# Patient Record
Sex: Female | Born: 1945 | Race: White | Hispanic: No | Smoking: Former smoker
Health system: Southern US, Community
[De-identification: ages and names within clinical notes are randomized; demographics above are authoritative.]

## PROBLEM LIST (undated history)

## (undated) DIAGNOSIS — R911 Solitary pulmonary nodule: Principal | ICD-10-CM

## (undated) DIAGNOSIS — R5383 Other fatigue: Principal | ICD-10-CM

## (undated) DIAGNOSIS — A419 Sepsis, unspecified organism: Secondary | ICD-10-CM

## (undated) DIAGNOSIS — R0902 Hypoxemia: Secondary | ICD-10-CM

## (undated) DIAGNOSIS — J189 Pneumonia, unspecified organism: Secondary | ICD-10-CM

## (undated) DIAGNOSIS — D61818 Other pancytopenia: Secondary | ICD-10-CM

## (undated) DIAGNOSIS — I639 Cerebral infarction, unspecified: Secondary | ICD-10-CM

## (undated) DIAGNOSIS — G4730 Sleep apnea, unspecified: Secondary | ICD-10-CM

## (undated) DIAGNOSIS — Z5189 Encounter for other specified aftercare: Secondary | ICD-10-CM

## (undated) DIAGNOSIS — K219 Gastro-esophageal reflux disease without esophagitis: Secondary | ICD-10-CM

## (undated) DIAGNOSIS — E119 Type 2 diabetes mellitus without complications: Principal | ICD-10-CM

## (undated) DIAGNOSIS — H269 Unspecified cataract: Secondary | ICD-10-CM

## (undated) DIAGNOSIS — F419 Anxiety disorder, unspecified: Secondary | ICD-10-CM

## (undated) DIAGNOSIS — I10 Essential (primary) hypertension: Secondary | ICD-10-CM

## (undated) DIAGNOSIS — F32A Depression, unspecified: Secondary | ICD-10-CM

## (undated) DIAGNOSIS — H353290 Exudative age-related macular degeneration, unspecified eye, stage unspecified: Secondary | ICD-10-CM

## (undated) DIAGNOSIS — M1990 Unspecified osteoarthritis, unspecified site: Secondary | ICD-10-CM

## (undated) DIAGNOSIS — A159 Respiratory tuberculosis unspecified: Secondary | ICD-10-CM

## (undated) DIAGNOSIS — E7800 Pure hypercholesterolemia, unspecified: Secondary | ICD-10-CM

## (undated) DIAGNOSIS — N189 Chronic kidney disease, unspecified: Secondary | ICD-10-CM

## (undated) DIAGNOSIS — R748 Abnormal levels of other serum enzymes: Secondary | ICD-10-CM

## (undated) DIAGNOSIS — K559 Vascular disorder of intestine, unspecified: Secondary | ICD-10-CM

## (undated) DIAGNOSIS — R05 Cough: Secondary | ICD-10-CM

## (undated) DIAGNOSIS — R0982 Postnasal drip: Secondary | ICD-10-CM

## (undated) DIAGNOSIS — M810 Age-related osteoporosis without current pathological fracture: Secondary | ICD-10-CM

## (undated) DIAGNOSIS — F329 Major depressive disorder, single episode, unspecified: Secondary | ICD-10-CM

## (undated) DIAGNOSIS — C9000 Multiple myeloma not having achieved remission: Secondary | ICD-10-CM

## (undated) DIAGNOSIS — E785 Hyperlipidemia, unspecified: Secondary | ICD-10-CM

## (undated) HISTORY — PX: APPENDECTOMY: SHX54

## (undated) HISTORY — DX: Hypoxemia: R09.02

## (undated) HISTORY — DX: Vascular disorder of intestine, unspecified: K55.9

## (undated) HISTORY — DX: Unspecified osteoarthritis, unspecified site: M19.90

## (undated) HISTORY — DX: Sleep apnea, unspecified: G47.30

## (undated) HISTORY — DX: Major depressive disorder, single episode, unspecified: F32.9

## (undated) HISTORY — DX: Cerebral infarction, unspecified: I63.9

## (undated) HISTORY — DX: Multiple myeloma not having achieved remission: C90.00

## (undated) HISTORY — DX: Gastro-esophageal reflux disease without esophagitis: K21.9

## (undated) HISTORY — DX: Chronic kidney disease, unspecified: N18.9

## (undated) HISTORY — DX: Cough: R05

## (undated) HISTORY — DX: Depression, unspecified: F32.A

## (undated) HISTORY — DX: Pure hypercholesterolemia, unspecified: E78.00

## (undated) HISTORY — DX: Postnasal drip: R09.82

## (undated) HISTORY — DX: Other pancytopenia: D61.818

## (undated) HISTORY — DX: Encounter for other specified aftercare: Z51.89

## (undated) HISTORY — DX: Solitary pulmonary nodule: R91.1

## (undated) HISTORY — DX: Type 2 diabetes mellitus without complications: E11.9

## (undated) HISTORY — DX: Essential (primary) hypertension: I10

## (undated) HISTORY — DX: Other fatigue: R53.83

## (undated) HISTORY — PX: TONSILLECTOMY: SHX5217

## (undated) HISTORY — DX: Exudative age-related macular degeneration, unspecified eye, stage unspecified: H35.3290

## (undated) HISTORY — DX: Age-related osteoporosis without current pathological fracture: M81.0

## (undated) HISTORY — PX: BREAST BIOPSY: SHX20

## (undated) HISTORY — DX: Unspecified cataract: H26.9

## (undated) HISTORY — DX: Anxiety disorder, unspecified: F41.9

## (undated) HISTORY — PX: LEG SURGERY: SHX1003

## (undated) HISTORY — PX: LUNG LOBECTOMY: SHX167

## (undated) HISTORY — DX: Hyperlipidemia, unspecified: E78.5

## (undated) HISTORY — PX: LAPAROSCOPIC, APPENDECTOMY: SHX4473

---

## 1995-03-20 ENCOUNTER — Inpatient Hospital Stay
Admission: EM | Admit: 1995-03-20 | Disposition: A | Discharge status: Home / Self Care | Payer: Self-pay | Source: Emergency Department | Admitting: Surgery

## 1996-01-28 HISTORY — PX: APPENDECTOMY: SHX54

## 1999-12-28 ENCOUNTER — Encounter (INDEPENDENT_AMBULATORY_CARE_PROVIDER_SITE_OTHER): Payer: Self-pay | Admitting: *Deleted

## 1999-12-28 LAB — CONVERTED CEMR LAB

## 2000-01-22 ENCOUNTER — Other Ambulatory Visit: Admission: RE | Admit: 2000-01-22 | Discharge: 2000-01-22 | Payer: Self-pay | Admitting: *Deleted

## 2000-02-26 ENCOUNTER — Encounter: Admission: RE | Admit: 2000-02-26 | Discharge: 2000-02-26 | Payer: Self-pay | Admitting: Family Medicine

## 2000-03-17 ENCOUNTER — Encounter: Admission: RE | Admit: 2000-03-17 | Discharge: 2000-03-17 | Payer: Self-pay | Admitting: Sports Medicine

## 2002-07-08 ENCOUNTER — Other Ambulatory Visit: Admission: RE | Admit: 2002-07-08 | Discharge: 2002-07-08 | Payer: Self-pay | Admitting: Family Medicine

## 2002-08-16 ENCOUNTER — Emergency Department (HOSPITAL_COMMUNITY): Admission: EM | Admit: 2002-08-16 | Discharge: 2002-08-16 | Payer: Self-pay | Admitting: Emergency Medicine

## 2002-08-16 ENCOUNTER — Encounter: Payer: Self-pay | Admitting: Emergency Medicine

## 2002-09-25 ENCOUNTER — Encounter (INDEPENDENT_AMBULATORY_CARE_PROVIDER_SITE_OTHER): Payer: Self-pay

## 2002-09-25 ENCOUNTER — Inpatient Hospital Stay (HOSPITAL_COMMUNITY): Admission: EM | Admit: 2002-09-25 | Discharge: 2002-09-28 | Payer: Self-pay | Admitting: Emergency Medicine

## 2003-01-06 ENCOUNTER — Ambulatory Visit (HOSPITAL_COMMUNITY): Admission: RE | Admit: 2003-01-06 | Discharge: 2003-01-06 | Payer: Self-pay | Admitting: Family Medicine

## 2003-01-16 ENCOUNTER — Ambulatory Visit (HOSPITAL_COMMUNITY): Admission: RE | Admit: 2003-01-16 | Discharge: 2003-01-16 | Payer: Self-pay | Admitting: *Deleted

## 2003-01-16 ENCOUNTER — Encounter (INDEPENDENT_AMBULATORY_CARE_PROVIDER_SITE_OTHER): Payer: Self-pay | Admitting: Specialist

## 2003-07-09 ENCOUNTER — Emergency Department (HOSPITAL_COMMUNITY): Admission: EM | Admit: 2003-07-09 | Discharge: 2003-07-09 | Payer: Self-pay | Admitting: Emergency Medicine

## 2003-07-19 ENCOUNTER — Ambulatory Visit (HOSPITAL_COMMUNITY): Admission: RE | Admit: 2003-07-19 | Discharge: 2003-07-19 | Payer: Self-pay | Admitting: *Deleted

## 2003-10-06 ENCOUNTER — Ambulatory Visit (HOSPITAL_COMMUNITY): Admission: RE | Admit: 2003-10-06 | Discharge: 2003-10-06 | Payer: Self-pay | Admitting: Orthopedic Surgery

## 2003-10-06 ENCOUNTER — Ambulatory Visit (HOSPITAL_BASED_OUTPATIENT_CLINIC_OR_DEPARTMENT_OTHER): Admission: RE | Admit: 2003-10-06 | Discharge: 2003-10-06 | Payer: Self-pay | Admitting: Orthopedic Surgery

## 2004-01-30 ENCOUNTER — Other Ambulatory Visit: Admission: RE | Admit: 2004-01-30 | Discharge: 2004-01-30 | Payer: Self-pay | Admitting: Family Medicine

## 2005-06-03 ENCOUNTER — Other Ambulatory Visit: Admission: RE | Admit: 2005-06-03 | Discharge: 2005-06-03 | Payer: Self-pay | Admitting: Family Medicine

## 2005-07-11 ENCOUNTER — Ambulatory Visit (HOSPITAL_COMMUNITY): Admission: RE | Admit: 2005-07-11 | Discharge: 2005-07-11 | Payer: Self-pay | Admitting: Family Medicine

## 2005-07-14 ENCOUNTER — Ambulatory Visit (HOSPITAL_COMMUNITY): Admission: RE | Admit: 2005-07-14 | Discharge: 2005-07-14 | Payer: Self-pay | Admitting: Family Medicine

## 2005-10-14 ENCOUNTER — Ambulatory Visit (HOSPITAL_COMMUNITY): Admission: RE | Admit: 2005-10-14 | Discharge: 2005-10-14 | Payer: Self-pay | Admitting: Family Medicine

## 2006-03-27 ENCOUNTER — Encounter (INDEPENDENT_AMBULATORY_CARE_PROVIDER_SITE_OTHER): Payer: Self-pay | Admitting: *Deleted

## 2006-09-04 ENCOUNTER — Ambulatory Visit (HOSPITAL_COMMUNITY): Admission: RE | Admit: 2006-09-04 | Discharge: 2006-09-04 | Payer: Self-pay | Admitting: Family Medicine

## 2007-01-08 ENCOUNTER — Encounter: Payer: Self-pay | Admitting: Pulmonary Disease

## 2007-01-08 ENCOUNTER — Ambulatory Visit (HOSPITAL_COMMUNITY): Admission: RE | Admit: 2007-01-08 | Discharge: 2007-01-08 | Payer: Self-pay | Admitting: Family Medicine

## 2007-01-28 DIAGNOSIS — K559 Vascular disorder of intestine, unspecified: Secondary | ICD-10-CM

## 2007-01-28 HISTORY — DX: Vascular disorder of intestine, unspecified: K55.9

## 2007-02-17 ENCOUNTER — Other Ambulatory Visit: Admission: RE | Admit: 2007-02-17 | Discharge: 2007-02-17 | Payer: Self-pay | Admitting: Family Medicine

## 2007-05-13 ENCOUNTER — Ambulatory Visit: Payer: Self-pay | Admitting: Pulmonary Disease

## 2007-05-13 DIAGNOSIS — J984 Other disorders of lung: Secondary | ICD-10-CM | POA: Insufficient documentation

## 2007-05-13 DIAGNOSIS — R930 Abnormal findings on diagnostic imaging of skull and head, not elsewhere classified: Secondary | ICD-10-CM | POA: Insufficient documentation

## 2007-05-17 DIAGNOSIS — K219 Gastro-esophageal reflux disease without esophagitis: Secondary | ICD-10-CM | POA: Insufficient documentation

## 2007-05-17 DIAGNOSIS — E785 Hyperlipidemia, unspecified: Secondary | ICD-10-CM | POA: Insufficient documentation

## 2007-05-17 DIAGNOSIS — M810 Age-related osteoporosis without current pathological fracture: Secondary | ICD-10-CM | POA: Insufficient documentation

## 2007-05-17 DIAGNOSIS — I10 Essential (primary) hypertension: Secondary | ICD-10-CM | POA: Insufficient documentation

## 2007-05-17 DIAGNOSIS — E1169 Type 2 diabetes mellitus with other specified complication: Secondary | ICD-10-CM | POA: Insufficient documentation

## 2007-05-24 ENCOUNTER — Ambulatory Visit: Payer: Self-pay | Admitting: Cardiovascular Disease

## 2007-05-25 ENCOUNTER — Telehealth (INDEPENDENT_AMBULATORY_CARE_PROVIDER_SITE_OTHER): Payer: Self-pay | Admitting: *Deleted

## 2007-06-16 ENCOUNTER — Ambulatory Visit: Payer: Self-pay | Admitting: Pulmonary Disease

## 2007-06-23 ENCOUNTER — Ambulatory Visit: Payer: Self-pay | Admitting: Pulmonary Disease

## 2008-01-14 ENCOUNTER — Ambulatory Visit (HOSPITAL_COMMUNITY): Admission: RE | Admit: 2008-01-14 | Discharge: 2008-01-14 | Payer: Self-pay | Admitting: Family Medicine

## 2009-04-18 ENCOUNTER — Other Ambulatory Visit: Admission: RE | Admit: 2009-04-18 | Discharge: 2009-04-18 | Payer: Self-pay | Admitting: Family Medicine

## 2010-01-11 ENCOUNTER — Ambulatory Visit (HOSPITAL_COMMUNITY): Admission: RE | Admit: 2010-01-11 | Payer: Self-pay | Source: Home / Self Care | Admitting: Family Medicine

## 2010-02-17 ENCOUNTER — Encounter: Payer: Self-pay | Admitting: Family Medicine

## 2010-05-24 ENCOUNTER — Other Ambulatory Visit (HOSPITAL_COMMUNITY): Payer: Self-pay | Admitting: Family Medicine

## 2010-05-24 DIAGNOSIS — Z1231 Encounter for screening mammogram for malignant neoplasm of breast: Secondary | ICD-10-CM

## 2010-05-31 ENCOUNTER — Ambulatory Visit (HOSPITAL_COMMUNITY)
Admission: RE | Admit: 2010-05-31 | Discharge: 2010-05-31 | Disposition: A | Discharge status: Home / Self Care | Payer: Federal, State, Local not specified - PPO | Source: Ambulatory Visit | Attending: Family Medicine | Admitting: Family Medicine

## 2010-05-31 DIAGNOSIS — Z1231 Encounter for screening mammogram for malignant neoplasm of breast: Secondary | ICD-10-CM | POA: Insufficient documentation

## 2010-06-04 ENCOUNTER — Other Ambulatory Visit: Payer: Self-pay | Admitting: Family Medicine

## 2010-06-04 DIAGNOSIS — R928 Other abnormal and inconclusive findings on diagnostic imaging of breast: Secondary | ICD-10-CM

## 2010-06-11 ENCOUNTER — Ambulatory Visit
Admission: RE | Admit: 2010-06-11 | Discharge: 2010-06-11 | Disposition: A | Discharge status: Home / Self Care | Payer: Federal, State, Local not specified - PPO | Source: Ambulatory Visit | Attending: Family Medicine | Admitting: Family Medicine

## 2010-06-11 DIAGNOSIS — R928 Other abnormal and inconclusive findings on diagnostic imaging of breast: Secondary | ICD-10-CM

## 2010-06-14 NOTE — Op Note (Signed)
NAME:  [name], [name]                           ACCOUNT NO.:  [id number]   MEDICAL RECORD NO.:  [id number]                   PATIENT TYPE:  AMB   LOCATION:  DSC                                  FACILITY:  [facility]   PHYSICIAN:  Cindee Salt, M.D.                    DATE OF BIRTH:  11-25-1945   DATE OF PROCEDURE:  10/06/2003  DATE OF DISCHARGE:                                 OPERATIVE REPORT   PREOPERATIVE DIAGNOSIS:  Status post fall, right wrist.   POSTOPERATIVE DIAGNOSIS:  Status post fall, right wrist.   OPERATION:  Arthroscopy right wrist, with debridement of TFCC, LT, shrinkage  scapholunate, debridement of large avulsion of articular cartilage ulnar  lunate -- approximately one-half of the proximal articular surface, with  partial synovectomy right wrist.   SURGEON:  Cindee Salt, M.D.   Threasa HeadsCarolyne Fiscal   ANESTHESIA:  Axillary block.   HISTORY:  The patient is a 65 year old female who suffered a fall on her  right wrist.  She has had significant pain, inability to use the wrist, with  loss of mobility since the fall.   DESCRIPTION OF PROCEDURE:  The patient is brought to the operating room,  where an axillary block was carried out without difficulty.  She was prepped  using Duraprep in the supine position with right arm free.  The limb was  placed in the arthroscopy tower; 10 pounds of traction applied, the joint  inflated to 3-4 portal.  The transverse incision was made, deepened with a  hemostat.  A blunt trocar was used to enter the joint. Care was taken to  protect the EPL tendon.  The joint was inspected and scapholunate ligament  showed a partial tear, with stretching of the ligament.  The volar wrist  ligaments were intact.  The proximal cartilage on the scaphoid was intact.  The cartilage on the distal radius was intact.  The ulnar side of the joint  was inspected; very significant synovitis was identified.  A large avulsion  of cartilage from the lunate and a TFCC  traumatic tear and tear of lunate  carpal joint was immediately apparent.  An irrigation catheter was placed in  fixed U.  A 4-5 portal opened after localization with a 22-gauge needle.  This was deepened with a hemostat, a blunt trocar used to enter the joint,  the joint inspected.  A debridement was then performed using shavers, a  radio frequency device Arthro Wand and forceps.  This removed approximately  one-half to two-thirds of the articular surface.  Cartilage was entirely  denuded from the lunate, including portions on the lunate facet of the  distal radius portion of the lunate.  The TFCC was debrided.  The mid carpal  joint was inspected through the ulnar portal to inspect the proximal  capitate; in that the feeling was that ulnar shortening osteotomy would not  give her significant relief, due to the fact that there was articular  cartilage lost on that portion of the lunate articulating with the distal  radius.  The distal radius articular surface was entirely intact.  Continued  pain would necessitate a proximal row carpectomy.  As such, it was decided  not to proceed with ulnar shortening osteotomy in any way at this point and  time, with the plan on discussing the future with the patient with a  likelihood of recommending a proximal row carpectomy for continued  treatment.   A sterile compressive dressing was applied, after closure of the portals and  removal of the instruments.  The patient was taken to the recovery room for  observation in satisfactory condition.   She is discharged home, to return to the [facility] in one  week; on Vicodin.                                               Cindee Salt, M.D.    [name]  D:  10/06/2003  T:  10/07/2003  Job:  [id number]

## 2010-06-14 NOTE — Op Note (Signed)
   [name], [name]                             ACCOUNT NO.:  [id number]   MEDICAL RECORD NO.:  [id number]                   PATIENT TYPE:  OBV   LOCATION:  0464                                 FACILITY:  [facility]   PHYSICIAN:  John C. Madilyn Fireman, M.D.                 DATE OF BIRTH:  [birth year]   DATE OF PROCEDURE:  09/25/2002  DATE OF DISCHARGE:                                 OPERATIVE REPORT   PROCEDURE:  Colonoscopy with biopsy.   INDICATIONS FOR PROCEDURE:  Rectal bleeding and abdominal pain.   DESCRIPTION OF PROCEDURE:  The patient was placed in the left lateral  decubitus position and placed on the pulse monitor with continuous low-flow  oxygen delivered by nasal cannula.  She was sedated with 100 mcg IV  fentanyl, 10 mg IV Versed.  The Olympus video colonoscope was inserted into  the rectum and advanced approximately to the hepatic flexure, bypassing the  area of extremely severe-appearing colitis.  Based on this, I did not feel  it necessary and possibly risky to proceed to the cecum.  The scope was  thought to be advanced near the hepatic flexure and this area appeared  normal as did the transverse colon.  Within the descending colon at about 60  cm, there was a transition from mild erythema and granularity to gross  erythema, submucosal hemorrhages, edema, and at least two smooth walled-  appearing submucosal bullae which I had not seen before.  Otherwise the  appearance was most consistent with ischemic colitis.  The involvement was  circumferential and fairly severe in appearance down to about 30 cm in  transition to normal mucosa over about 10 cm.  From 20 cm down to the anus,  there was no abnormalities seen.  There were polyps, foreign bodies,  diverticula, or any other abnormalities seen.  Multiple biopsies were taken  of the inflamed area.  The scope was then withdrawn and the patient returned  to the recovery room in stable condition.  She tolerated the procedure well  and there were no immediate complications.   IMPRESSION:  Severe segmental colitis, most consistent with ischemia.   PLAN:  Will begin antibiotics and continue bowel rest while awaiting  pathology.                                                John C. Madilyn Fireman, M.D.    JCH/MEDQ  D:  09/25/2002  T:  09/25/2002  Job:  [id number]

## 2010-06-14 NOTE — H&P (Signed)
[name], [name]                             ACCOUNT NO.:  [id number]   MEDICAL RECORD NO.:  [id number]                   PATIENT TYPE:  OBV   LOCATION:  3149                                 [facility]ACILITY:  [facility]   PHYSICIAN:  John C. Amedeo Plenty, M.D.                 DATE OF BIRTH:  Mar 25, 1945   DATE OF ADMISSION:  09/24/2002  DATE OF DISCHARGE:                                HISTORY & PHYSICAL   HISTORY OF PRESENT ILLNESS:  The patient is a 65 year old white female who  presents with one week of constipation which she attempted to relieve with  Correctol in a suppository, but this caused increased abdominal cramping  which led to straining to have a bowel movement this morning, with no  results except for bright red blood on three or four occasions and with  continued abdominal cramping, and no stool ever seen by the patient.  She  presented to Dr. Georga Bora this morning who performed an anoscopy which  showed blood above the anal verge and no perianal source of bleeding noted.  Her hemoglobin was 14.8 and her vital signs were stable.  She was to be seen  in two days to set up a screening colonoscopy by Ronald Lobo, M.D.  She  has never had any prior rectal bleeding or any GI problems.   PAST MEDICAL HISTORY:  1. Depression.  2. Gastroesophageal reflux.   PAST SURGICAL HISTORY:  1. Appendectomy.  2. Leg surgery with rod in her left leg.  3. Tonsillectomy.   MEDICATIONS:  Actonel, omeprazole, and Wellbutrin.   ALLERGIES:  None.   FAMILY HISTORY:  Mother alive and well.  Father died of an MI.  Sister died  of a synovial sarcoma.   SOCIAL HISTORY:  The patient is divorced.  She smokes about 1/2 pack of  cigarettes a day and about two drinks per day.   PHYSICAL EXAMINATION:  GENERAL:  A well-developed, well-nourished white  female in no acute distress.  HEENT:  Unremarkable.  HEART:  Regular rate and rhythm without murmur.  LUNGS:  Clear.  ABDOMEN:  Soft, nondistended with  normal active bowel sounds.  No  hepatosplenomegaly, mass, or guarding.  There is minimal lower abdominal  tenderness.  RECTAL:  Not repeated.   IMPRESSION:  Rectal bleeding distal source likely, though no evidence of  perianal source.    PLAN:  I have decided to go ahead and admit her on observation status and  prep for a colonoscopy which we will proceed with tomorrow.                                               John C. Amedeo Plenty, M.D.    JCH/MEDQ  D:  09/24/2002  T:  09/24/2002  Job:  [id number]   cc:   Nelda Marseille, M.D.

## 2010-06-14 NOTE — Discharge Summary (Signed)
   [name], [name]                             ACCOUNT NO.:  [id number]   MEDICAL RECORD NO.:  [id number]                   PATIENT TYPE:  INP   LOCATION:  0464                                 FACILITY:  [facility]   PHYSICIAN:  Althea Grimmer. Luther Parody, M.D.            DATE OF BIRTH:  12-29-1945   DATE OF ADMISSION:  09/24/2002  DATE OF DISCHARGE:  09/28/2002                                 DISCHARGE SUMMARY   DISCHARGE DIAGNOSES:  1. Ischemic colitis.  2. Depression.  3. Gastroesophageal reflux.   HISTORY OF PRESENT ILLNESS:  The patient is a 65 year old female admitted by  John C. Madilyn Fireman, M.D.  She reported one week of constipation but following use  of Correctol suppositories she had a lot of cramping and bright red blood  per rectum with continual abdominal pain.   PAST SURGICAL HISTORY:  1. Appendectomy.  2. Orthopedic surgery on left leg.  3. Tonsillectomy.   For the remainder of her history, please see the admission note.   PHYSICAL EXAMINATION:  GENERAL:  She was in no acute distress.  ABDOMEN:  Soft, nondistended with normal bowel sounds.  There was no  hepatosplenomegaly, mass, or guarding.   HOSPITAL COURSE:  The patient was prepped for colonoscopy the following day  and she had findings of severe segmental colitis from 20-30 cm from the  anus.  Biopsies of this were consistent.  The patient was treated with  intravenous Unasyn and bowel rest and she rapidly improved.  On August 31  her diet was advanced and she was switched to oral antibiotics, Cipro and  Flagyl.  On September 1 she was doing fairly well with minimal abdominal  pain, tolerating a low fiber diet.   DISCHARGE MEDICATIONS:  1. Wellbutrin 300 mg daily.  2. Omeprazole 10 mg daily.  3. Flagyl 500 mg t.i.d. for seven days.  4. Ciprofloxacin 500 mg b.i.d. for seven days.    CONDITION ON DISCHARGE:  Improved.   FOLLOWUP:  Return to the office in two to three weeks or call p.r.n.                          Althea Grimmer. Luther Parody, M.D.    PJS/MEDQ  D:  09/28/2002  T:  09/28/2002  Job:  [id number]   cc:   Molly Maduro L. Foy Guadal[place]ara, M.D.  [address] [employer] [place]  [place] [address]  Fax: [phone]

## 2011-01-28 HISTORY — PX: COLONOSCOPY: SHX174

## 2011-07-28 ENCOUNTER — Other Ambulatory Visit (HOSPITAL_COMMUNITY): Payer: Self-pay | Admitting: Family Medicine

## 2011-07-28 DIAGNOSIS — Z1231 Encounter for screening mammogram for malignant neoplasm of breast: Secondary | ICD-10-CM

## 2011-08-20 ENCOUNTER — Ambulatory Visit (HOSPITAL_COMMUNITY): Payer: Federal, State, Local not specified - PPO

## 2011-09-04 ENCOUNTER — Ambulatory Visit (HOSPITAL_COMMUNITY): Payer: Federal, State, Local not specified - PPO

## 2011-10-01 ENCOUNTER — Ambulatory Visit (HOSPITAL_COMMUNITY): Payer: Federal, State, Local not specified - PPO

## 2011-10-27 ENCOUNTER — Other Ambulatory Visit: Payer: Self-pay | Admitting: Gastroenterology

## 2011-10-27 DIAGNOSIS — K5730 Diverticulosis of large intestine without perforation or abscess without bleeding: Secondary | ICD-10-CM | POA: Diagnosis not present

## 2011-10-27 DIAGNOSIS — D126 Benign neoplasm of colon, unspecified: Secondary | ICD-10-CM | POA: Diagnosis not present

## 2011-10-27 DIAGNOSIS — D649 Anemia, unspecified: Secondary | ICD-10-CM | POA: Diagnosis not present

## 2011-10-27 DIAGNOSIS — D1330 Benign neoplasm of unspecified part of small intestine: Secondary | ICD-10-CM | POA: Diagnosis not present

## 2011-10-27 DIAGNOSIS — K219 Gastro-esophageal reflux disease without esophagitis: Secondary | ICD-10-CM | POA: Diagnosis not present

## 2011-10-28 DIAGNOSIS — D126 Benign neoplasm of colon, unspecified: Secondary | ICD-10-CM | POA: Diagnosis not present

## 2011-10-28 DIAGNOSIS — D649 Anemia, unspecified: Secondary | ICD-10-CM | POA: Diagnosis not present

## 2011-12-17 DIAGNOSIS — D649 Anemia, unspecified: Secondary | ICD-10-CM | POA: Diagnosis not present

## 2011-12-17 DIAGNOSIS — K219 Gastro-esophageal reflux disease without esophagitis: Secondary | ICD-10-CM | POA: Diagnosis not present

## 2012-01-06 DIAGNOSIS — E1129 Type 2 diabetes mellitus with other diabetic kidney complication: Secondary | ICD-10-CM | POA: Diagnosis not present

## 2012-01-06 DIAGNOSIS — F329 Major depressive disorder, single episode, unspecified: Secondary | ICD-10-CM | POA: Diagnosis not present

## 2012-01-06 DIAGNOSIS — F3289 Other specified depressive episodes: Secondary | ICD-10-CM | POA: Diagnosis not present

## 2012-01-06 DIAGNOSIS — N181 Chronic kidney disease, stage 1: Secondary | ICD-10-CM | POA: Diagnosis not present

## 2012-01-06 DIAGNOSIS — E1149 Type 2 diabetes mellitus with other diabetic neurological complication: Secondary | ICD-10-CM | POA: Diagnosis not present

## 2012-01-06 DIAGNOSIS — D649 Anemia, unspecified: Secondary | ICD-10-CM | POA: Diagnosis not present

## 2012-01-06 DIAGNOSIS — Z23 Encounter for immunization: Secondary | ICD-10-CM | POA: Diagnosis not present

## 2012-01-06 DIAGNOSIS — E669 Obesity, unspecified: Secondary | ICD-10-CM | POA: Diagnosis not present

## 2012-01-06 DIAGNOSIS — R7401 Elevation of levels of liver transaminase levels: Secondary | ICD-10-CM | POA: Diagnosis not present

## 2012-01-06 DIAGNOSIS — E7800 Pure hypercholesterolemia, unspecified: Secondary | ICD-10-CM | POA: Diagnosis not present

## 2012-05-06 ENCOUNTER — Telehealth: Payer: Self-pay | Admitting: Oncology

## 2012-05-06 NOTE — Telephone Encounter (Addendum)
LVOM FOR PT TO RETURN CALL IN RE NP APPT.  °

## 2012-05-12 ENCOUNTER — Telehealth: Payer: Self-pay | Admitting: Oncology

## 2012-05-12 NOTE — Telephone Encounter (Signed)
C/D 05/12/12 for appt. 05/31/12 °

## 2012-05-27 DIAGNOSIS — C9000 Multiple myeloma not having achieved remission: Secondary | ICD-10-CM

## 2012-05-27 HISTORY — DX: Multiple myeloma not having achieved remission: C90.00

## 2012-05-28 ENCOUNTER — Encounter: Payer: Self-pay | Admitting: Oncology

## 2012-05-31 ENCOUNTER — Other Ambulatory Visit (HOSPITAL_BASED_OUTPATIENT_CLINIC_OR_DEPARTMENT_OTHER): Payer: Medicare Other | Admitting: Lab

## 2012-05-31 ENCOUNTER — Ambulatory Visit (HOSPITAL_BASED_OUTPATIENT_CLINIC_OR_DEPARTMENT_OTHER): Payer: Medicare Other | Admitting: Oncology

## 2012-05-31 ENCOUNTER — Encounter: Payer: Self-pay | Admitting: Oncology

## 2012-05-31 ENCOUNTER — Other Ambulatory Visit: Payer: Self-pay | Admitting: Oncology

## 2012-05-31 ENCOUNTER — Telehealth: Payer: Self-pay | Admitting: Oncology

## 2012-05-31 ENCOUNTER — Ambulatory Visit: Payer: Medicare Other

## 2012-05-31 VITALS — BP 142/77 | HR 78 | Temp 97.4°F | Resp 20 | Ht 65.0 in | Wt 208.4 lb

## 2012-05-31 DIAGNOSIS — D61818 Other pancytopenia: Secondary | ICD-10-CM

## 2012-05-31 DIAGNOSIS — R945 Abnormal results of liver function studies: Secondary | ICD-10-CM

## 2012-05-31 DIAGNOSIS — R7989 Other specified abnormal findings of blood chemistry: Secondary | ICD-10-CM

## 2012-05-31 DIAGNOSIS — D649 Anemia, unspecified: Secondary | ICD-10-CM

## 2012-05-31 DIAGNOSIS — R5383 Other fatigue: Secondary | ICD-10-CM

## 2012-05-31 LAB — CBC WITH DIFFERENTIAL/PLATELET
BASO%: 0.2 % (ref 0.0–2.0)
Basophils Absolute: 0 10*3/uL (ref 0.0–0.1)
EOS%: 3 % (ref 0.0–7.0)
HGB: 11.4 g/dL — ABNORMAL LOW (ref 11.6–15.9)
MCH: 32.2 pg (ref 25.1–34.0)
MCV: 97 fL (ref 79.5–101.0)
MONO%: 6.8 % (ref 0.0–14.0)
RBC: 3.54 10*6/uL — ABNORMAL LOW (ref 3.70–5.45)
RDW: 14.4 % (ref 11.2–14.5)
lymph#: 1.3 10*3/uL (ref 0.9–3.3)

## 2012-05-31 LAB — MORPHOLOGY: PLT EST: ADEQUATE

## 2012-05-31 LAB — FERRITIN: Ferritin: 72 ng/mL (ref 10–291)

## 2012-05-31 NOTE — Progress Notes (Signed)
[place]  [phone] Fax:([phone]) [place]    Referral MD:  [name], M.D.  Reason for Referral: anemia and leukopenia.     HPI: [name]. Winward is a 67 year-old woman.  She was told that she had anemia as a kid and was told to have some form of blood dyscrasia.  She has not required pRBC nor had any known problem of her CBC.  For the past two months, she has been feeling tired.  Her PCP checked her CBC on 05/05/2011 which showed WBC 2.4; Hgb 11; Plt 148; ANC 1.3.  She was kindly referred to the [facility] for evaluation.  Anemia work up with colonoscopy in 2013 with one adenomatous polyp; she is due for a repeat in 2918.  EGD was negative.  HIV was negative.  TSH, iron panel (without ferritin), Vit B12 were normal.   [name] presented to the clinic by herself today.  She reported that she has been having progressive fatigue for the last two months.  She sleeps about 12 hours a night, but can hardly get herself out of bed in the morning. Patient denies fever, anorexia, weight loss, headache, visual changes, confusion, drenching night sweats, palpable lymph node swelling, mucositis, odynophagia, dysphagia, nausea vomiting, jaundice, chest pain, palpitation, shortness of breath, dyspnea on exertion, productive cough, gum bleeding, epistaxis, hematemesis, hemoptysis, abdominal pain, abdominal swelling, early satiety, melena, hematochezia, hematuria, skin rash, spontaneous bleeding, joint swelling, joint pain, heat or cold intolerance, bowel bladder incontinence, back pain, focal motor weakness, paresthesia.     Past Medical History  Diagnosis Date  . Depression   . Osteoporosis   . Hypercholesterolemia   . Esophageal reflux disease   . Hypertension   . Diabetes type 2, controlled   . Sleep apnea   . Pancytopenia   . Ischemic colitis 2009  :    Past Surgical History  Procedure Laterality Date  .  Colonoscopy  2013    poly; Dr. Oletta Lamas, next one due 2018.l  . Appendectomy    . Tonsillectomy    :   CURRENT MEDS: Current Outpatient Prescriptions  Medication Sig Dispense Refill  . buPROPion (WELLBUTRIN XL) 300 MG 24 hr tablet Take 300 mg by mouth daily.      Marland Kitchen esomeprazole (NEXIUM) 40 MG capsule Take 40 mg by mouth daily before breakfast.      . losartan (COZAAR) 50 MG tablet Take 50 mg by mouth daily.      . metformin (FORTAMET) 500 MG (OSM) 24 hr tablet Take 1,500 mg by mouth. daily      . metoprolol succinate (TOPROL-XL) 50 MG 24 hr tablet Take 50 mg by mouth daily. Take with or immediately following a meal.      . Multiple Vitamins-Minerals (MULTIVITAMIN WITH MINERALS) tablet Take 1 tablet by mouth daily.      . sertraline (ZOLOFT) 100 MG tablet Take 100 mg by mouth daily.      . simvastatin (ZOCOR) 40 MG tablet Take 40 mg by mouth every evening.       No current facility-administered medications for this visit.      No Known Allergies:  Family History  Problem Relation Age of Onset  . Heart attack Father   . Cancer Sister     synovial sarcoma  . Cancer Maternal Grandfather     ? cancer  :  History   Social History  . Marital  Status: Divorced    Spouse Name: N/A    Number of Children: 2  . Years of Education: N/A   Occupational History  .      retired.    Social History Main Topics  . Smoking status: Former Smoker -- 1.00 packs/day for 25 years    Quit date: 01/27/2009  . Smokeless tobacco: Never Used  . Alcohol Use: 1.8 oz/week    3 Glasses of wine per week  . Drug Use: No  . Sexually Active: Not on file   Other Topics Concern  . Not on file   Social History Narrative  . No narrative on file  :  REVIEW OF SYSTEM:  The rest of the 14-point review of sytem was negative.   Exam: ECOG 1  General:  well-nourished woman, in no acute distress.  Eyes:  no scleral icterus.  ENT:  There were no oropharyngeal lesions.  Neck was without thyromegaly.   Lymphatics:  Negative cervical, supraclavicular or axillary adenopathy.  Respiratory: lungs were clear bilaterally without wheezing or crackles.  Cardiovascular:  Regular rate and rhythm, S1/S2, without murmur, rub or gallop.  There was no pedal edema.  GI:  abdomen was soft, flat, nontender, nondistended, without organomegaly.  Muscoloskeletal:  no spinal tenderness of palpation of vertebral spine.  Skin exam was without echymosis, petichae.  Neuro exam was nonfocal.  Patient was able to get on and off exam table without assistance.  Gait was normal.  Patient was alert and oriented.  Attention was good.   Language was appropriate.  Mood was normal without depression.  Speech was not pressured.  Thought content was not tangential.    LABS:  Lab Results  Component Value Date   WBC 3.9 05/31/2012   HGB 11.4* 05/31/2012   HCT 34.3* 05/31/2012   PLT 173 05/31/2012    Blood smear review:   I personally reviewed the patient's peripheral blood smear today.  There was isocytosis.  There was no peripheral blast.  There was no schistocytosis, spherocytosis, target cell, rouleaux formation, tear drop cell.  There was no giant platelets or platelet clumps.      ASSESSMENT AND PLAN:   1.  Issue:  Slight normocytic anemia; neutropenia (resolved) -  Unclear causes:  Work up was negative for HIV, iron deficiency, Vit B12 deficiency, low thyroid.  I sent blood work today to rule out myeloma (a slow growing bone marrow cancer which can cause anemia).  However, there is a possibility of EtOH-induced cytopenia.  - There is low clinical suspicion for bone marrow failure such as myelodysplastic syndrome despite her diagnosis of some type of blood dyscrasia when she was 67 years-old.  A bone marrow biopsy at this time has low yield. - Recommendation:  *  Stop EtOH.   *  Follow up:  Repeat blood count at the [facility] in about 3 months.  Return to clinic in about 6 months.  In the future, if blood counts significantly  decrease, then further work up may be considered.   2.  Elevated LFT's:  I strongly recommended that she taper down her EtOH in take to no more than 1 glass of wine a day.  She currently drinks about a bottle at each sitting a few times a week.  I referred her to abdominal US to rule out cirrhosis.   3.  Chronic fatigue:  Unclear etiology.  Hgb of 11.4 is not normally the cause of fatigue.  Given her divorce, sleeping 12  hour/day, EtOH intake, and fatigue, I query if there is a component of depression.   The length of time of the face-to-face encounter was 45 minutes. More than 50% of time was spent counseling and coordination of care.     Thank you for this referral.

## 2012-05-31 NOTE — Patient Instructions (Addendum)
1.  Diagnosis:  Slight anemia. 2.  Unclear causes:  Work up was negative for HIV, iron deficiency, Vit B12 deficiency, low thyroid.  I sent blood work today to rule out myeloma (a slow growing bone marrow cancer which can cause anemia).  3.  There is low clinical suspicion for bone marrow failure such as myelodysplastic syndrome.  A bone marrow biopsy at this time has low yield. 4.  Recommendation:  *  Make sure colonoscopy is up to date.  *  Follow up:  [facility] in about [facility] months.  Return to clinic in about 6 months.  In the future, if blood counts significantly decrease, then further work up may be considered.

## 2012-05-31 NOTE — Progress Notes (Signed)
Checked in new pt with no financial concerns. °

## 2012-05-31 NOTE — Telephone Encounter (Signed)
CALLED DR. Gerri Spore OFFICE REQUESTING LABS TO BE FAXED OVER.

## 2012-06-02 ENCOUNTER — Other Ambulatory Visit: Payer: Self-pay | Admitting: Oncology

## 2012-06-02 LAB — PROTEIN ELECTROPHORESIS, SERUM
Beta 2: 3.5 % (ref 3.2–6.5)
Beta Globulin: 6.4 % (ref 4.7–7.2)
M-Spike, %: 1.69 g/dL

## 2012-06-03 ENCOUNTER — Telehealth: Payer: Self-pay | Admitting: Oncology

## 2012-06-03 NOTE — Telephone Encounter (Signed)
, °

## 2012-06-04 ENCOUNTER — Telehealth: Payer: Self-pay | Admitting: Oncology

## 2012-06-04 ENCOUNTER — Encounter: Payer: Self-pay | Admitting: Oncology

## 2012-06-04 ENCOUNTER — Ambulatory Visit (HOSPITAL_BASED_OUTPATIENT_CLINIC_OR_DEPARTMENT_OTHER): Payer: Medicare Other | Admitting: Oncology

## 2012-06-04 VITALS — BP 150/67 | HR 83 | Temp 97.9°F | Resp 20 | Ht 65.0 in | Wt 209.3 lb

## 2012-06-04 DIAGNOSIS — R5381 Other malaise: Secondary | ICD-10-CM

## 2012-06-04 DIAGNOSIS — R5383 Other fatigue: Secondary | ICD-10-CM

## 2012-06-04 DIAGNOSIS — D649 Anemia, unspecified: Secondary | ICD-10-CM

## 2012-06-04 DIAGNOSIS — D472 Monoclonal gammopathy: Secondary | ICD-10-CM

## 2012-06-04 NOTE — Telephone Encounter (Signed)
gv and printed appt sched for pt for May...sent pt to lab to pick up jug.Marland KitchenMarland KitchenMarland Kitchen

## 2012-06-04 NOTE — Patient Instructions (Addendum)
1.  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 2.  Need to rule out multiple myeloma. 3.  Work up:   *  24-hour urine collection.  *  Bone marrow biopsy  *  Skeletal Xray.  4.  If myeloma; this will account for anemia and fatigue.  It is a very treatable type of bone marrow cancer.

## 2012-06-04 NOTE — Progress Notes (Signed)
[facility]  Telephone:([phone] Fax:([phone]) [phone]   OFFICE PROGRESS NOTE   Cc:  [name], MD  DIAGNOSIS: mild anemia with positive monoclonal protein; work up pending.   CURRENT THERAPY:  Pending work up.   INTERVAL HISTORY: [name] 67 y.o. female returns to discuss the result of her positive monoclonal protein earlier this week.  She still has mild fatigue.  She is still independent of all activities of daily living.  She still takes EtOH but claims that it's much less than what it used to be.  She denied fever, chest pain, SOB, abd pain/swelling, jaundice.  The rest of the 14-point review of system was negative.   Past Medical History  Diagnosis Date  . Depression   . Osteoporosis   . Hypercholesterolemia   . Esophageal reflux disease   . Hypertension   . Diabetes type 2, controlled   . Sleep apnea   . Pancytopenia   . Ischemic colitis 2009  . Monoclonal gammopathy     Past Surgical History  Procedure Laterality Date  . Colonoscopy  2013    poly; Dr. Randa Evens, next one due 2018.l  . Appendectomy    . Tonsillectomy      Current Outpatient Prescriptions  Medication Sig Dispense Refill  . buPROPion (WELLBUTRIN XL) 300 MG 24 hr tablet Take 300 mg by mouth daily.      Marland Kitchen esomeprazole (NEXIUM) 40 MG capsule Take 40 mg by mouth daily before breakfast.      . losartan (COZAAR) 50 MG tablet Take 50 mg by mouth daily.      . metformin (FORTAMET) 500 MG (OSM) 24 hr tablet Take 1,500 mg by mouth. daily      . metoprolol succinate (TOPROL-XL) 50 MG 24 hr tablet Take 50 mg by mouth daily. Take with or immediately following a meal.      . Multiple Vitamins-Minerals (MULTIVITAMIN WITH MINERALS) tablet Take 1 tablet by mouth daily.      . sertraline (ZOLOFT) 100 MG tablet Take 100 mg by mouth daily.      . simvastatin (ZOCOR) 40 MG tablet Take 40 mg by mouth every evening.       No current facility-administered medications for this visit.     ALLERGIES:  has No Known Allergies.  REVIEW OF SYSTEMS:  The rest of the 14-point review of system was negative.   Filed Vitals:   06/04/12 1201  BP: 150/67  Pulse: 83  Temp: 97.9 F (36.6 C)  Resp: 20   Wt Readings from Last 3 Encounters:  06/04/12 209 lb 4.8 oz (94.938 kg)  05/31/12 208 lb 6.4 oz (94.53 kg)  06/23/07 232 lb (105.235 kg)   ECOG Performance status: 1  PHYSICAL EXAMINATION:  She deferred exam since there was no new complaint compared to earlier this week.     LABORATORY/RADIOLOGY DATA:  Lab Results  Component Value Date   WBC 3.9 05/31/2012   HGB 11.4* 05/31/2012   HCT 34.3* 05/31/2012   PLT 173 05/31/2012     ASSESSMENT AND PLAN:    1.  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 2.  Need to rule out multiple myeloma. 3.  Work up:   *  24-hour urine collection.  *  Bone marrow biopsy  *  Skeletal Xray.  4.  If myeloma; this will account for anemia and fatigue.  It is a very treatable type of bone marrow cancer.   She expressed informed understanding and  wished to proceed as recommended.     The length of time of the face-to-face encounter was 15 minutes. More than 50% of time was spent counseling and coordination of care.

## 2012-06-08 ENCOUNTER — Other Ambulatory Visit (HOSPITAL_COMMUNITY)
Admission: RE | Admit: 2012-06-08 | Discharge: 2012-06-08 | Disposition: A | Discharge status: Home / Self Care | Payer: Medicare Other | Source: Ambulatory Visit | Attending: Oncology | Admitting: Oncology

## 2012-06-08 ENCOUNTER — Other Ambulatory Visit (HOSPITAL_BASED_OUTPATIENT_CLINIC_OR_DEPARTMENT_OTHER): Payer: Medicare Other | Admitting: Lab

## 2012-06-08 ENCOUNTER — Ambulatory Visit (HOSPITAL_BASED_OUTPATIENT_CLINIC_OR_DEPARTMENT_OTHER): Payer: Medicare Other | Admitting: Oncology

## 2012-06-08 VITALS — BP 138/84 | HR 77 | Temp 97.2°F

## 2012-06-08 DIAGNOSIS — D649 Anemia, unspecified: Secondary | ICD-10-CM | POA: Insufficient documentation

## 2012-06-08 DIAGNOSIS — D472 Monoclonal gammopathy: Secondary | ICD-10-CM

## 2012-06-08 DIAGNOSIS — D72819 Decreased white blood cell count, unspecified: Secondary | ICD-10-CM | POA: Insufficient documentation

## 2012-06-08 DIAGNOSIS — C9030 Solitary plasmacytoma not having achieved remission: Secondary | ICD-10-CM | POA: Insufficient documentation

## 2012-06-08 LAB — COMPREHENSIVE METABOLIC PANEL (CC13)
Albumin: 3.4 g/dL — ABNORMAL LOW (ref 3.5–5.0)
CO2: 22 mEq/L (ref 22–29)
Glucose: 120 mg/dl — ABNORMAL HIGH (ref 70–99)
Potassium: 4.6 mEq/L (ref 3.5–5.1)
Sodium: 137 mEq/L (ref 136–145)
Total Protein: 8 g/dL (ref 6.4–8.3)

## 2012-06-08 LAB — CBC WITH DIFFERENTIAL/PLATELET
Basophils Absolute: 0 10*3/uL (ref 0.0–0.1)
EOS%: 2.6 % (ref 0.0–7.0)
HGB: 11 g/dL — ABNORMAL LOW (ref 11.6–15.9)
MCH: 32.1 pg (ref 25.1–34.0)
MCHC: 32.8 g/dL (ref 31.5–36.0)
MCV: 97.9 fL (ref 79.5–101.0)
MONO%: 7.5 % (ref 0.0–14.0)
NEUT%: 47.4 % (ref 38.4–76.8)
RDW: 14.9 % — ABNORMAL HIGH (ref 11.2–14.5)

## 2012-06-08 LAB — LACTATE DEHYDROGENASE (CC13): LDH: 140 U/L (ref 125–245)

## 2012-06-08 LAB — BONE MARROW EXAM: Bone Marrow Exam: 297

## 2012-06-08 NOTE — Progress Notes (Signed)
Please see bone marrow biopsy note dated today.  

## 2012-06-08 NOTE — Patient Instructions (Addendum)
Bone Marrow Aspiration, Bone Marrow Biopsy  Care After  Read the instructions outlined below and refer to this sheet in the next few weeks. These discharge instructions provide you with general information on caring for yourself after you leave the hospital. Your caregiver may also give you specific instructions. While your treatment has been planned according to the most current medical practices available, unavoidable complications occasionally occur. If you have any problems or questions after discharge, call your caregiver.  FINDING OUT THE RESULTS OF YOUR TEST  Not all test results are available during your visit. If your test results are not back during the visit, make an appointment with your caregiver to find out the results. Do not assume everything is normal if you have not heard from your caregiver or the medical facility. It is important for you to follow up on all of your test results.   HOME CARE INSTRUCTIONS   You have had sedation and may be sleepy or dizzy. Your thinking may not be as clear as usual. For the next 24 hours:   Only take over-the-counter or prescription medicines for pain, discomfort, and or fever as directed by your caregiver.   Do not drink alcohol.   Do not smoke.   Do not drive.   Do not make important legal decisions.   Do not operate heavy machinery.   Do not care for small children by yourself.   Keep your dressing clean and dry. You may replace dressing with a bandage after 24 hours.   You may take a bath or shower after 24 hours.   Use an ice pack for 20 minutes every 2 hours while awake for pain as needed.  SEEK MEDICAL CARE IF:    There is redness, swelling, or increasing pain at the biopsy site.   There is pus coming from the biopsy site.   There is drainage from a biopsy site lasting longer than one day.   An unexplained oral temperature above 102 F (38.9 C) develops.  SEEK IMMEDIATE MEDICAL CARE IF:    You develop a rash.   You have difficulty  breathing.   You develop any reaction or side effects to medications given.  Document Released: 08/02/2004 Document Revised: 04/07/2011 Document Reviewed: 01/11/2008  ExitCare Patient Information 2013 [employer].

## 2012-06-08 NOTE — Procedures (Signed)
   [facility]  Telephone:(336) [phone] Fax:(336) [phone]   BONE MARROW BIOPSY AND ASPIRATION   INDICATION: anemia, positive monoclonal spike.   Procedure: After obtained from consent, [name] was placed in the prone position. Time out was performed verifying correct patient and procedure. The skin overlying the left posterior crest was prepped with Betadine and draped in the usual sterile fashion. The skin and periosteum were infiltrated with 20 mL of 2% lidocaine. A small puncture wound was made with #11 scalpel blade.  Bone marrow aspirate was obtained on the first pass of the aspiration needle.  One core biopsy was obtained through the same incision.   The aspirate was sent for routine histology, flow cytometry, and cytogenetics.  Core biopsy was sent for routine histology.   [name] tolerated procedure well with minimal  blood loss and without immediate complication.   A sterile dressing was applied.   Jethro Bolus M.D. 06/08/2012

## 2012-06-08 NOTE — Progress Notes (Signed)
Pt's dressing clean, dry, and intact at discharge.  Pt instructed to leave the dressing on for 24 hours, after that pt can just apply a bandaid as needed.  Pt can take OTC pain killer like acetaminophen as needed or call for excess pain or other problems, concerns and questions.  Pt verbalized understanding.

## 2012-06-10 LAB — UIFE/LIGHT CHAINS/TP QN, 24-HR UR
Albumin, U: DETECTED
Alpha 1, Urine: DETECTED — AB
Alpha 2, Urine: DETECTED — AB
Beta, Urine: DETECTED — AB
Free Kappa/Lambda Ratio: 4.66 ratio (ref 2.04–10.37)
Free Lt Chn Excr Rate: 73.84 mg/d
Total Protein, Urine-Ur/day: 108 mg/d (ref 10–140)
Total Protein, Urine: 8.3 mg/dL

## 2012-06-10 LAB — IMMUNOFIXATION ELECTROPHORESIS: IgG (Immunoglobin G), Serum: 2080 mg/dL — ABNORMAL HIGH (ref 690–1700)

## 2012-06-11 ENCOUNTER — Ambulatory Visit (HOSPITAL_BASED_OUTPATIENT_CLINIC_OR_DEPARTMENT_OTHER): Payer: Medicare Other | Admitting: Oncology

## 2012-06-11 ENCOUNTER — Telehealth: Payer: Self-pay | Admitting: Oncology

## 2012-06-11 ENCOUNTER — Encounter: Payer: Self-pay | Admitting: Oncology

## 2012-06-11 ENCOUNTER — Ambulatory Visit (HOSPITAL_COMMUNITY)
Admission: RE | Admit: 2012-06-11 | Discharge: 2012-06-11 | Disposition: A | Discharge status: Home / Self Care | Payer: Medicare Other | Source: Ambulatory Visit | Attending: Oncology | Admitting: Oncology

## 2012-06-11 VITALS — BP 136/54 | HR 87 | Temp 97.6°F | Resp 18 | Ht 65.0 in | Wt 210.0 lb

## 2012-06-11 DIAGNOSIS — K7689 Other specified diseases of liver: Secondary | ICD-10-CM | POA: Insufficient documentation

## 2012-06-11 DIAGNOSIS — R911 Solitary pulmonary nodule: Secondary | ICD-10-CM

## 2012-06-11 DIAGNOSIS — R945 Abnormal results of liver function studies: Secondary | ICD-10-CM

## 2012-06-11 DIAGNOSIS — D472 Monoclonal gammopathy: Secondary | ICD-10-CM | POA: Insufficient documentation

## 2012-06-11 DIAGNOSIS — E119 Type 2 diabetes mellitus without complications: Secondary | ICD-10-CM | POA: Insufficient documentation

## 2012-06-11 DIAGNOSIS — R7402 Elevation of levels of lactic acid dehydrogenase (LDH): Secondary | ICD-10-CM | POA: Insufficient documentation

## 2012-06-11 DIAGNOSIS — R7989 Other specified abnormal findings of blood chemistry: Secondary | ICD-10-CM

## 2012-06-11 DIAGNOSIS — C9000 Multiple myeloma not having achieved remission: Secondary | ICD-10-CM

## 2012-06-11 DIAGNOSIS — I10 Essential (primary) hypertension: Secondary | ICD-10-CM | POA: Insufficient documentation

## 2012-06-11 DIAGNOSIS — D61818 Other pancytopenia: Secondary | ICD-10-CM | POA: Insufficient documentation

## 2012-06-11 DIAGNOSIS — R7401 Elevation of levels of liver transaminase levels: Secondary | ICD-10-CM | POA: Insufficient documentation

## 2012-06-11 HISTORY — DX: Solitary pulmonary nodule: R91.1

## 2012-06-11 NOTE — Patient Instructions (Signed)
1.  Diagnosis:  Multiple myeloma (19% plasma cell; slight anemia).  It is rather smoldering instead of being active.  However, once anemia starts to be more persistent or fatigue worsens, then, chemo will need to be initiated.  2.  New issue:  Right lung base nodule.  Need to rule out lung cancer.  Recommendation:  CT chest and referral to radiology to biopsy to rule out lung cancer.  3.  Follow up:  In about 3 weeks.

## 2012-06-11 NOTE — Telephone Encounter (Signed)
Gave pt appt for June and gave order for Ct to Lilyan Punt to precert for CT

## 2012-06-13 NOTE — Progress Notes (Signed)
[facility]  Telephone:(336) [phone] Fax:([phone]   OFFICE PROGRESS NOTE   Cc:  [name], MD  DIAGNOSIS:  IgG kappa multiple myeloma.  She presented in 05/2012 with anemia, Cr 0.8, Ca 9.6; no bone lytic lesion; LDH 140; beta2- microglobulin 1.86 (ref 1.01-1.73). Positive SPEP for M-spike at  1.69,  IgG 2080, kappa 7.24, kappa:lambda ratio 24.97; UPEP positive for Bence Jones protein.  Bone marrow biopsy on 06/08/12 showed 19% plasma cell; cytogenetics and FISH pending.    CURRENT THERAPY:  Here to discuss treatment option.   INTERVAL HISTORY: [name] 67 y.o. female returns by herself today.  She reported no new symptoms compared to last visit.  She has chronic, mild fatigue.  However, she still is able to live by herself and is independent of all activities of daily living. She has mild ache in the bone marrow biopsy site but she does not require any pain med. The rest of the 14-point review of system was negative.   Past Medical History  Diagnosis Date  . Depression   . Osteoporosis   . Hypercholesterolemia   . Esophageal reflux disease   . Hypertension   . Diabetes type 2, controlled   . Sleep apnea   . Pancytopenia   . Ischemic colitis 2009  . Monoclonal gammopathy   . Lung nodule 06/11/2012    Past Surgical History  Procedure Laterality Date  . Colonoscopy  2013    poly; Dr. Randa Evens, next one due 2018.l  . Appendectomy    . Tonsillectomy      Current Outpatient Prescriptions  Medication Sig Dispense Refill  . buPROPion (WELLBUTRIN XL) 300 MG 24 hr tablet Take 300 mg by mouth daily.      Marland Kitchen esomeprazole (NEXIUM) 40 MG capsule Take 40 mg by mouth 2 (two) times daily.       Marland Kitchen losartan (COZAAR) 50 MG tablet Take 50 mg by mouth daily.      . metformin (FORTAMET) 500 MG (OSM) 24 hr tablet Take 1,500 mg by mouth. daily      . metoprolol succinate (TOPROL-XL) 50 MG 24 hr tablet Take 50 mg by mouth daily. Take with or immediately following  a meal.      . Multiple Vitamins-Minerals (MULTIVITAMIN WITH MINERALS) tablet Take 1 tablet by mouth daily.      . sertraline (ZOLOFT) 100 MG tablet Take 100 mg by mouth daily.      . simvastatin (ZOCOR) 40 MG tablet Take 40 mg by mouth every evening.       No current facility-administered medications for this visit.    ALLERGIES:  has No Known Allergies.  REVIEW OF SYSTEMS:  The rest of the 14-point review of system was negative.   Filed Vitals:   06/11/12 1142  BP: 136/54  Pulse: 87  Temp: 97.6 F (36.4 C)  Resp: 18   Wt Readings from Last 3 Encounters:  06/11/12 210 lb (95.255 kg)  06/04/12 209 lb 4.8 oz (94.938 kg)  05/31/12 208 lb 6.4 oz (94.53 kg)   ECOG Performance status: 1  PHYSICAL EXAMINATION:  Left posterior iliac crest biopsy site has healed without erythema, pain on palpation.  The rest of the exam was deferred due to lack of new symptoms.     LABORATORY/RADIOLOGY DATA:  Lab Results  Component Value Date   WBC 3.1* 06/08/2012   HGB 11.0* 06/08/2012   HCT 33.5* 06/08/2012   PLT 163 06/08/2012   GLUCOSE 120*  06/08/2012   ALKPHOS 74 06/08/2012   ALT 47 06/08/2012   AST 34 06/08/2012   NA 137 06/08/2012   K 4.6 06/08/2012   CL 105 06/08/2012   CREATININE 0.8 06/08/2012   BUN 16.5 06/08/2012   CO2 22 06/08/2012    US Abdomen Complete  06/11/2012   *RADIOLOGY REPORT*  Clinical Data:  Pancytopenia, history of hypertension and diabetes, now with elevated LFTs  COMPLETE ABDOMINAL ULTRASOUND  Comparison:  Abdominal MRA - 07/19/2003  Findings:  Gallbladder:  Sonographically normal.  No echogenic gallstones or gall sludge.  No gallbladder wall thickening or pericholecystic fluid.  Negative sonographic Murphy's sign.  Common bile duct:  Normal in size measuring  Liver:  There is mild diffuse increased, slightly coarsened echogenicity of the hepatic parenchyma.  No discrete hepatic lesions.  No definite evidence of intrahepatic biliary ductal dilatation.  No ascites.  IVC:   Appears normal.  Pancreas:  Limited visualization of the pancreatic head and neck is normal.  Visualization of the pancreatic body and tail is obscured by bowel gas.  Spleen:  Normal in size measuring 0.8 cm in length  Right Kidney:  Normal cortical thickness, echogenicity and size, measuring 12.0 cm in length.  No focal renal lesions.  No echogenic renal stones.  No urinary obstruction.  Left Kidney:  Normal cortical thickness, echogenicity and size, measuring 11.7 cm in length.  No focal renal lesions.  No echogenic renal stones.  No urinary obstruction.  Abdominal aorta:  No aneurysm identified.  IMPRESSION: Suspected hepatic steatosis, otherwise, unremarkable abdominal ultrasound.   Original Report Authenticated By: Tacey Ruiz, MD   Dg Bone Survey Met  06/11/2012   *RADIOLOGY REPORT*  Clinical Data: Monoclonal gammopathy.  Rule out myeloma  METASTATIC BONE SURVEY  Comparison: None  Findings: The skull is negative.  No acute fracture.  No lytic bone lesions are identified.  12 mm nodule in the right lung base appears noncalcified.  No other lung nodule or adenopathy. This is not identified on the chest CT of 05/24/2007.  IMPRESSION: No lytic lesions are seen suggesting myeloma.  12 mm lung nodule the right lung base.  This could represent a benign or malignant lung nodule.  Chest CT with contrast is suggested for further evaluation.  I discussed the findings by telephone with Dr. Gaylyn Rong   Original Report Authenticated By: Janeece Riggers, M.D.       ASSESSMENT AND PLAN:   1.  Abnormal LFT's:  Fatty liver on [place]. No cirrhosis.  I again advised her on stopping EtOH.  2.  Right lower lobe lung nodule:  Incidental finding on skeletal survey.  She had history of smoking.  I referred her to CT chest and potential biopsy either by IR or by Pulmonary depending on CT result.   3.  New diagnosis of multiple myeloma: - She has anemia and 19% plasma on bone marrow biopsy.  She does need treatment as her anemia has  been worsening.  However, I recommended work up of her lung mass and treatment of lung cancer if it turns out to be the case before treatment of her myeloma.  We will defer the detailed discussion of treatment of myeloma to another date.    Ms. Tarleton expressed informed understanding and wished to proceed as recommended.   4.  Follow up:  In about 3 weeks.      The length of time of the face-to-face encounter was 25 minutes. More than 50% of time was spent  counseling and coordination of care.        Huan T. Gaylyn Rong, M.D.

## 2012-06-14 ENCOUNTER — Ambulatory Visit (HOSPITAL_COMMUNITY)
Admission: RE | Admit: 2012-06-14 | Discharge: 2012-06-14 | Disposition: A | Discharge status: Home / Self Care | Payer: Medicare Other | Source: Ambulatory Visit | Attending: Oncology | Admitting: Oncology

## 2012-06-14 DIAGNOSIS — C9000 Multiple myeloma not having achieved remission: Secondary | ICD-10-CM | POA: Insufficient documentation

## 2012-06-14 DIAGNOSIS — D472 Monoclonal gammopathy: Secondary | ICD-10-CM

## 2012-06-14 DIAGNOSIS — R911 Solitary pulmonary nodule: Secondary | ICD-10-CM | POA: Insufficient documentation

## 2012-06-14 MED ORDER — IOHEXOL 300 MG/ML  SOLN
100.0000 mL | Freq: Once | INTRAMUSCULAR | Status: AC | PRN
  Administered 2012-06-14: 100 mL via INTRAVENOUS

## 2012-06-16 ENCOUNTER — Encounter: Payer: Self-pay | Admitting: Oncology

## 2012-06-16 ENCOUNTER — Other Ambulatory Visit: Payer: Self-pay | Admitting: Emergency Medicine

## 2012-06-16 DIAGNOSIS — R918 Other nonspecific abnormal finding of lung field: Secondary | ICD-10-CM

## 2012-06-16 NOTE — Progress Notes (Signed)
Asked by Dr Gaylyn Rong to review CT scan and to see regarding RML bx. Will get super D CT disk and see asap in office to schedule

## 2012-06-17 ENCOUNTER — Other Ambulatory Visit: Payer: Self-pay | Admitting: Oncology

## 2012-06-17 ENCOUNTER — Telehealth: Payer: Self-pay | Admitting: *Deleted

## 2012-06-17 DIAGNOSIS — C9000 Multiple myeloma not having achieved remission: Secondary | ICD-10-CM

## 2012-06-17 DIAGNOSIS — R911 Solitary pulmonary nodule: Secondary | ICD-10-CM

## 2012-06-17 NOTE — Telephone Encounter (Signed)
Spoke with pt regarding referral.  I have emailed Dr. Delton Coombes and his office for appt.

## 2012-06-18 ENCOUNTER — Telehealth: Payer: Self-pay | Admitting: Oncology

## 2012-06-18 NOTE — Telephone Encounter (Signed)
Pt called yesterday for referral to [facility] for second opinion.  She says she may eventually want to have her treatment at [facility],  But would at least like to have a second opinion.  She wants to also pursue seeing Dr. Delton Coombes and [facility] clinic here as scheduled asap.   Informed pt of referral made by Dr. Gaylyn Rong to [facility].  She verbalized understanding.

## 2012-06-22 ENCOUNTER — Encounter: Payer: Self-pay | Admitting: Emergency Medicine

## 2012-06-22 ENCOUNTER — Ambulatory Visit (INDEPENDENT_AMBULATORY_CARE_PROVIDER_SITE_OTHER): Payer: Medicare Other | Admitting: Emergency Medicine

## 2012-06-22 ENCOUNTER — Telehealth: Payer: Self-pay | Admitting: Emergency Medicine

## 2012-06-22 VITALS — BP 120/72 | HR 81 | Temp 98.0°F | Ht 66.0 in | Wt 210.8 lb

## 2012-06-22 DIAGNOSIS — R911 Solitary pulmonary nodule: Secondary | ICD-10-CM

## 2012-06-22 NOTE — Assessment & Plan Note (Signed)
RLL nodule. The case is complicated by new dx of MM. I think she would be a candidate for VATS and primary resection if PET were consistent with stage 1 disease. The MM might throw [place] off and cause [place] to up-stage her on PET. Also, time is important here - need to start MM therapy asap. The lesion is certainly reachable by FOB/ENB. i will review case with TCTS, decide whether to get PET and possible VATS vs go straight to FOB. Will call her to arrange.

## 2012-06-22 NOTE — Telephone Encounter (Signed)
Per Dr.Byrum--  He has spoken with thoracic surgeon and he advises for patient to go ahead with bronch Call patient and inform her of the above. Place orders for EMB to be set up.  I have spoke with patient and informed her of the above, order has been placed for EMB to be set up, patient aware she will be contacted to schedule date/time for bronch. Nothing further needed at this time.

## 2012-06-22 NOTE — Patient Instructions (Addendum)
Dr Delton Coombes will review your case with Thoracic Surgery to help [place] decide whether bronchoscopy of surgery is the appropriate next step  Dr Delton Coombes will call you to discuss further.

## 2012-06-22 NOTE — Progress Notes (Signed)
Subjective:    Patient ID: [name], female    DOB: 1945/02/28, 67 y.o.   MRN: [id number]  HPI 67 yo woman, former smoker, DM2, OSA, followed by Dr Lamonte Sakai for multiple myeloma. She is referred for evaluation of a 1.2x1.6cm RLL nodule. She has not yet had a PET. She has been fatigued and weak.   Review of Systems  Constitutional: Negative for fever and unexpected weight change.  HENT: Positive for congestion, sneezing and postnasal drip. Negative for ear pain, nosebleeds, sore throat, rhinorrhea, trouble swallowing, dental problem and sinus pressure.   Eyes: Negative for redness and itching.  Respiratory: Negative for cough, chest tightness, shortness of breath and wheezing.   Cardiovascular: Negative for palpitations and leg swelling.  Gastrointestinal: Negative for nausea and vomiting.  Genitourinary: Negative for dysuria.  Musculoskeletal: Negative for joint swelling.  Skin: Negative for rash.  Neurological: Negative for headaches.  Hematological: Does not bruise/bleed easily.  Psychiatric/Behavioral: Negative for dysphoric mood. The patient is not nervous/anxious.     Past Medical History  Diagnosis Date  . Depression   . Osteoporosis   . Hypercholesterolemia   . Esophageal reflux disease   . Hypertension   . Diabetes type 2, controlled   . Sleep apnea   . Pancytopenia   . Ischemic colitis 2009  . Monoclonal gammopathy   . Lung nodule 06/11/2012     Family History  Problem Relation Age of Onset  . Heart attack Father   . Cancer Sister     synovial sarcoma  . Cancer Maternal Grandfather     ? cancer     History   Social History  . Marital Status: Divorced    Spouse Name: N/A    Number of Children: 2  . Years of Education: N/A   Occupational History  . retired     adm. a[employer]   Social History Main Topics  . Smoking status: Former Smoker -- 1.00 packs/day for 30 years    Types: Cigarettes    Quit date: 01/27/2009  . Smokeless tobacco: Never Used  .  Alcohol Use: 1.8 oz/week    3 Glasses of wine per week     Comment: 1 bottle per wine per night at times  . Drug Use: No  . Sexually Active: Not on file   Other Topics Concern  . Not on file   Social History Narrative  . No narrative on file     No Known Allergies   Outpatient Prescriptions Prior to Visit  Medication Sig Dispense Refill  . buPROPion (WELLBUTRIN XL) 300 MG 24 hr tablet Take 300 mg by mouth daily.      Marland Kitchen esomeprazole (NEXIUM) 40 MG capsule Take 40 mg by mouth 2 (two) times daily.       Marland Kitchen losartan (COZAAR) 50 MG tablet Take 50 mg by mouth daily.      . metformin (FORTAMET) 500 MG (OSM) 24 hr tablet Take 1,500 mg by mouth. daily      . metoprolol succinate (TOPROL-XL) 50 MG 24 hr tablet Take 50 mg by mouth daily. Take with or immediately following a meal.      . Multiple Vitamins-Minerals (MULTIVITAMIN WITH MINERALS) tablet Take 1 tablet by mouth daily.      . sertraline (ZOLOFT) 100 MG tablet Take 100 mg by mouth daily.      . simvastatin (ZOCOR) 40 MG tablet Take 40 mg by mouth every evening.       No facility-administered medications prior  to visit.         Objective:   Physical Exam Filed Vitals:   06/22/12 1342  BP: 120/72  Pulse: 81  Temp: 98 F (36.7 C)  TempSrc: Oral  Height: 5' 6"  (1.676 m)  Weight: 210 lb 12.8 oz (95.618 kg)  SpO2: 98%   Gen: Pleasant, overwt, in no distress,  normal affect  ENT: No lesions,  mouth clear,  oropharynx clear, no postnasal drip  Neck: No JVD, no TMG, no carotid bruits  Lungs: No use of accessory muscles, no dullness to percussion, clear without rales or rhonchi  Cardiovascular: RRR, heart sounds normal, no murmur or gallops, no peripheral edema  Musculoskeletal: No deformities, no cyanosis or clubbing  Neuro: alert, non focal  Skin: Warm, no lesions or rashes      Assessment & Plan:  Lung nodule RLL nodule. The case is complicated by new dx of MM. I think she would be a candidate for VATS and  primary resection if PET were consistent with stage 1 disease. The MM might throw [place] off and cause [place] to up-stage her on PET. Also, time is important here - need to start MM therapy asap. The lesion is certainly reachable by FOB/ENB. i will review case with TCTS, decide whether to get PET and possible VATS vs go straight to FOB. Will call her to arrange.

## 2012-06-23 ENCOUNTER — Encounter: Payer: Self-pay | Admitting: Oncology

## 2012-06-23 ENCOUNTER — Telehealth: Payer: Self-pay | Admitting: Emergency Medicine

## 2012-06-23 ENCOUNTER — Telehealth: Payer: Self-pay | Admitting: *Deleted

## 2012-06-23 ENCOUNTER — Other Ambulatory Visit: Payer: Self-pay | Admitting: Oncology

## 2012-06-23 NOTE — Telephone Encounter (Signed)
With regard to the lung nodule issue, the lung doctors will have to clarify with her.  I cannot given an opinion as to not to contradict them.  Thanks.

## 2012-06-23 NOTE — Telephone Encounter (Signed)
Spoke with patient, to inform her of her ENB date/time/place,  Says she has not heard anything from [facility] as of yet, but you would like a second opinion. Patient states she is unsure right now and has several questions: She was told the nodule Almost 1 inch diameter, reports says nodule is 1.2x1.6 cm,  which one is it? Was XRay received that was taken at Dr. Delanna Ahmadi office March 2014- she states when she had this xray done she was given any indication that it was abnormal? How did Dr. Delton Coombes decide if the ENB was the best "way" (type of bronch)  to go about this?-- says she has been looking up diff types of bronchs online Says she has not heard anything from [facility] as of yet, but would like a second opinion before proceeding and would like Dr. Delton Coombes to answer these questions for as well, says she just feels really upset about everything that is going on. Dr. Delton Coombes, please advise, thank you.

## 2012-06-23 NOTE — Telephone Encounter (Signed)
Pt left VM stating she has some questions for Dr. Gaylyn Rong.  Called pt back to see if this RN could answer any of her questions.  She prefers to s/w Dr. Gaylyn Rong directly.   She has questions about the size of her lung nodule.  Says Dr. Delton Coombes told her the nodule was "almost 1 in in diameter" but the information she read says it is only about 1/2 in in diameter.  She feels this is a big discrepancy and would like to size of the nodule to be clarified.   She would also like more information about the location of the nodule, being told it is in a "difficult location" to biopsy.  She would also like to know if her most recent scan was compared to the Xray she had done in March to see how quickly the nodule is growing?  Pt also had question about referral to [facility]  I told her Dr. Gaylyn Rong did make the referral and I will f/u w/ our Medical Records Dept to make sure the referrals were sent for Myeloma and the lung nodule.

## 2012-06-23 NOTE — Telephone Encounter (Signed)
I have called Dr. Marya Amsler office [phone] to request CXR on disc done March 2014 per patient Patient would like RB to have this to review w newest one They are to call back once this is done

## 2012-06-23 NOTE — Telephone Encounter (Signed)
Called pt back and instructed to direct all questions r/t lung nodule to Dr. Delton Coombes.  She verbalized understanding.

## 2012-06-23 NOTE — Telephone Encounter (Signed)
Discussed options with her, particularly VATS vs ENB. I reviewed the case with Dr Maren Beach also. We agree that ENB would be best approach in light of the coexisting MM. I answered her questions about the procedure. We are scheduled for 07/08/12.

## 2012-06-25 ENCOUNTER — Telehealth: Payer: Self-pay | Admitting: Oncology

## 2012-06-25 NOTE — Telephone Encounter (Signed)
Pt appt. With Drs. Loney Hering and McIver @ [facility] is 07/02/12@8 :00. Medical records faxed,slides and scans will be fedex'ed. Pt is aware.

## 2012-06-28 ENCOUNTER — Telehealth: Payer: Self-pay | Admitting: Oncology

## 2012-06-28 ENCOUNTER — Telehealth: Payer: Self-pay | Admitting: *Deleted

## 2012-06-28 NOTE — Telephone Encounter (Signed)
Informed pt of office visit/lab w/ Dr. Gaylyn Rong this week will be r/s to After her lung biopsy.  Pt reports biopsy scheduled for 6/12.  Will r/s Dr. Gaylyn Rong visit to 6/18 or 6/19 and POF sent.  Pt verbalized understanding.

## 2012-07-02 ENCOUNTER — Other Ambulatory Visit: Payer: Medicare Other | Admitting: Lab

## 2012-07-02 ENCOUNTER — Ambulatory Visit: Payer: Medicare Other | Admitting: Oncology

## 2012-07-02 ENCOUNTER — Encounter (HOSPITAL_COMMUNITY)
Admission: RE | Admit: 2012-07-02 | Discharge: 2012-07-02 | Disposition: A | Discharge status: Home / Self Care | Payer: Medicare Other | Source: Ambulatory Visit | Attending: Emergency Medicine | Admitting: Emergency Medicine

## 2012-07-02 NOTE — Pre-Procedure Instructions (Signed)
[name]  07/02/2012   Your procedure is scheduled on:  Thursday, June 12th.  Report to [facility] at 5:30AM. Enter through the Main Entrance, take the [facility] to the 3rd floor- Short Stay.   Call this number if you have problems the morning of surgery: [phone]   Remember:   Do not eat food or drink liquids after midnight.   Take these medicines the morning of surgery with A SIP OF WATER: buPROPion (WELLBUTRIN XL), esomeprazole (NEXIUMmetoprolol succinate (TOPROL-XL ), Sertraline (ZOLOFT)       Do not wear jewelry, make-up or nail polish.  Do not wear lotions, powders, or perfumes. You may wear deodorant.  Do not shave 48 hours prior to surgery.   Do not bring valuables to the hospital.  [facility] is not responsible for any belongings or valuables.  Contacts, dentures or bridgework may not be worn into surgery.  Leave suitcase in the car. After surgery it may be brought to your room.  For patients admitted to the hospital, checkout time is 11:00 AM the day of discharge.   Patients discharged the day of surgery will not be allowed to drive home.  Name and phone number of your driver: -    Special Instructions: Shower using CHG 2 nights before surgery and the night before surgery.  If you shower the day of surgery use CHG.  Use special wash - you have one bottle of CHG for all showers.  You should use approximately 1/3 of the bottle for each shower.   Please read over the following fact sheets that you were given: Pain Booklet, Coughing and Deep Breathing and Surgical Site Infection Prevention

## 2012-07-07 ENCOUNTER — Encounter (HOSPITAL_COMMUNITY): Payer: Self-pay

## 2012-07-07 ENCOUNTER — Encounter (HOSPITAL_COMMUNITY)
Admission: RE | Admit: 2012-07-07 | Discharge: 2012-07-07 | Disposition: A | Discharge status: Home / Self Care | Payer: Medicare Other | Source: Ambulatory Visit | Attending: Emergency Medicine | Admitting: Emergency Medicine

## 2012-07-07 ENCOUNTER — Encounter (HOSPITAL_COMMUNITY): Payer: Self-pay | Admitting: Pharmacy Technician

## 2012-07-07 ENCOUNTER — Ambulatory Visit (HOSPITAL_COMMUNITY)
Admission: RE | Admit: 2012-07-07 | Discharge: 2012-07-07 | Disposition: A | Discharge status: Home / Self Care | Payer: Medicare Other | Source: Ambulatory Visit | Attending: Emergency Medicine | Admitting: Emergency Medicine

## 2012-07-07 HISTORY — DX: Abnormal levels of other serum enzymes: R74.8

## 2012-07-07 HISTORY — DX: Respiratory tuberculosis unspecified: A15.9

## 2012-07-07 LAB — COMPREHENSIVE METABOLIC PANEL
BUN: 17 mg/dL (ref 6–23)
CO2: 27 mEq/L (ref 19–32)
Calcium: 10.1 mg/dL (ref 8.4–10.5)
Creatinine, Ser: 0.65 mg/dL (ref 0.50–1.10)
GFR calc Af Amer: >90 mL/min (ref >90)
GFR calc non Af Amer: >90 mL/min (ref >90)
Glucose, Bld: 178 mg/dL — ABNORMAL HIGH (ref 70–99)

## 2012-07-07 LAB — CBC
HCT: 34.2 % — ABNORMAL LOW (ref 36.0–46.0)
Hemoglobin: 11.5 g/dL — ABNORMAL LOW (ref 12.0–15.0)
MCH: 32.1 pg (ref 26.0–34.0)
MCV: 95.5 fL (ref 78.0–100.0)
Platelets: 191 10*3/uL (ref 150–400)
RBC: 3.58 MIL/uL — ABNORMAL LOW (ref 3.87–5.11)

## 2012-07-07 LAB — PROTIME-INR: INR: 0.95 (ref 0.00–1.49)

## 2012-07-07 NOTE — Progress Notes (Signed)
07/07/12 1331  OBSTRUCTIVE SLEEP APNEA  Have you ever been diagnosed with sleep apnea through a sleep study? No (UPPER AIRWAY RESISTANCE SYNDROME )  Do you snore loudly (loud enough to be heard through closed doors)?  1  Do you often feel tired, fatigued, or sleepy during the daytime? 1 (HAS MULTIPLE MYELOMA  )  Has anyone observed you stop breathing during your sleep? 0  Do you have, or are you being treated for high blood pressure? 1  BMI more than 35 kg/m2? 0  Age over 42 years old? 1  Gender: 0  Obstructive Sleep Apnea Score 4  Score 4 or greater  Results sent to PCP

## 2012-07-08 ENCOUNTER — Ambulatory Visit (HOSPITAL_COMMUNITY)
Admission: RE | Admit: 2012-07-08 | Discharge: 2012-07-08 | Disposition: A | Discharge status: Home / Self Care | Payer: Medicare Other | Source: Ambulatory Visit | Attending: Emergency Medicine | Admitting: Emergency Medicine

## 2012-07-08 ENCOUNTER — Encounter (HOSPITAL_COMMUNITY): Payer: Self-pay | Admitting: Certified Registered"

## 2012-07-08 ENCOUNTER — Encounter (HOSPITAL_COMMUNITY)
Admission: RE | Disposition: A | Discharge status: Home / Self Care | Payer: Self-pay | Source: Ambulatory Visit | Attending: Emergency Medicine

## 2012-07-08 ENCOUNTER — Ambulatory Visit (HOSPITAL_COMMUNITY): Payer: Medicare Other

## 2012-07-08 ENCOUNTER — Ambulatory Visit (HOSPITAL_COMMUNITY): Payer: Medicare Other | Admitting: Certified Registered"

## 2012-07-08 DIAGNOSIS — E7800 Pure hypercholesterolemia, unspecified: Secondary | ICD-10-CM | POA: Insufficient documentation

## 2012-07-08 DIAGNOSIS — I10 Essential (primary) hypertension: Secondary | ICD-10-CM | POA: Insufficient documentation

## 2012-07-08 DIAGNOSIS — C9000 Multiple myeloma not having achieved remission: Secondary | ICD-10-CM | POA: Insufficient documentation

## 2012-07-08 DIAGNOSIS — E119 Type 2 diabetes mellitus without complications: Secondary | ICD-10-CM | POA: Insufficient documentation

## 2012-07-08 DIAGNOSIS — D472 Monoclonal gammopathy: Secondary | ICD-10-CM

## 2012-07-08 DIAGNOSIS — K219 Gastro-esophageal reflux disease without esophagitis: Secondary | ICD-10-CM | POA: Insufficient documentation

## 2012-07-08 DIAGNOSIS — M810 Age-related osteoporosis without current pathological fracture: Secondary | ICD-10-CM | POA: Insufficient documentation

## 2012-07-08 DIAGNOSIS — G4733 Obstructive sleep apnea (adult) (pediatric): Secondary | ICD-10-CM | POA: Insufficient documentation

## 2012-07-08 DIAGNOSIS — Z808 Family history of malignant neoplasm of other organs or systems: Secondary | ICD-10-CM | POA: Insufficient documentation

## 2012-07-08 DIAGNOSIS — Z87891 Personal history of nicotine dependence: Secondary | ICD-10-CM | POA: Insufficient documentation

## 2012-07-08 DIAGNOSIS — R911 Solitary pulmonary nodule: Secondary | ICD-10-CM

## 2012-07-08 DIAGNOSIS — F329 Major depressive disorder, single episode, unspecified: Secondary | ICD-10-CM | POA: Insufficient documentation

## 2012-07-08 DIAGNOSIS — Z79899 Other long term (current) drug therapy: Secondary | ICD-10-CM | POA: Insufficient documentation

## 2012-07-08 DIAGNOSIS — F3289 Other specified depressive episodes: Secondary | ICD-10-CM | POA: Insufficient documentation

## 2012-07-08 DIAGNOSIS — C3430 Malignant neoplasm of lower lobe, unspecified bronchus or lung: Secondary | ICD-10-CM | POA: Insufficient documentation

## 2012-07-08 DIAGNOSIS — Z8249 Family history of ischemic heart disease and other diseases of the circulatory system: Secondary | ICD-10-CM | POA: Insufficient documentation

## 2012-07-08 HISTORY — PX: VIDEO BRONCHOSCOPY WITH ENDOBRONCHIAL NAVIGATION: SHX6175

## 2012-07-08 LAB — GLUCOSE, CAPILLARY
Glucose-Capillary: 129 mg/dL — ABNORMAL HIGH (ref 70–99)
Glucose-Capillary: 138 mg/dL — ABNORMAL HIGH (ref 70–99)

## 2012-07-08 SURGERY — VIDEO BRONCHOSCOPY WITH ENDOBRONCHIAL NAVIGATION
Anesthesia: General | Site: Bronchus | Laterality: Right | Wound class: Clean Contaminated

## 2012-07-08 MED ORDER — FENTANYL CITRATE 0.05 MG/ML IJ SOLN
INTRAMUSCULAR | Status: DC | PRN
  Administered 2012-07-08: 50 ug via INTRAVENOUS

## 2012-07-08 MED ORDER — PHENYLEPHRINE HCL 10 MG/ML IJ SOLN
INTRAMUSCULAR | Status: DC | PRN
  Administered 2012-07-08 (×3): 80 ug via INTRAVENOUS

## 2012-07-08 MED ORDER — METOPROLOL TARTRATE 50 MG PO TABS
ORAL_TABLET | ORAL | Status: AC
  Administered 2012-07-08: 50 mg via ORAL
  Filled 2012-07-08: qty 1

## 2012-07-08 MED ORDER — LACTATED RINGERS IV SOLN
INTRAVENOUS | Status: DC | PRN
  Administered 2012-07-08 (×2): via INTRAVENOUS

## 2012-07-08 MED ORDER — METOPROLOL TARTRATE 50 MG PO TABS: 50.0000 mg | ORAL_TABLET | Freq: Once | ORAL | Status: AC

## 2012-07-08 MED ORDER — PROPOFOL 10 MG/ML IV BOLUS
INTRAVENOUS | Status: DC | PRN
  Administered 2012-07-08: 200 mg via INTRAVENOUS
  Administered 2012-07-08: 50 mg via INTRAVENOUS

## 2012-07-08 MED ORDER — EPHEDRINE SULFATE 50 MG/ML IJ SOLN
INTRAMUSCULAR | Status: DC | PRN
  Administered 2012-07-08: 10 mg via INTRAVENOUS
  Administered 2012-07-08: 5 mg via INTRAVENOUS

## 2012-07-08 MED ORDER — ROCURONIUM BROMIDE 100 MG/10ML IV SOLN
INTRAVENOUS | Status: DC | PRN
  Administered 2012-07-08: 40 mg via INTRAVENOUS

## 2012-07-08 MED ORDER — FENTANYL CITRATE 0.05 MG/ML IJ SOLN: 25.0000 ug | INTRAMUSCULAR | Status: DC | PRN

## 2012-07-08 MED ORDER — 0.9 % SODIUM CHLORIDE (POUR BTL) OPTIME
TOPICAL | Status: DC | PRN
  Administered 2012-07-08: 1000 mL

## 2012-07-08 MED ORDER — GLYCOPYRROLATE 0.2 MG/ML IJ SOLN
INTRAMUSCULAR | Status: DC | PRN
  Administered 2012-07-08: 0.4 mg via INTRAVENOUS
  Administered 2012-07-08: 0.2 mg via INTRAVENOUS

## 2012-07-08 MED ORDER — EPINEPHRINE HCL 1 MG/ML IJ SOLN
INTRAMUSCULAR | Status: AC
  Filled 2012-07-08: qty 2

## 2012-07-08 MED ORDER — LACTATED RINGERS IV SOLN: INTRAVENOUS | Status: DC

## 2012-07-08 MED ORDER — LIDOCAINE HCL (CARDIAC) 20 MG/ML IV SOLN
INTRAVENOUS | Status: DC | PRN
  Administered 2012-07-08: 50 mg via INTRAVENOUS

## 2012-07-08 MED ORDER — MIDAZOLAM HCL 5 MG/5ML IJ SOLN
INTRAMUSCULAR | Status: DC | PRN
  Administered 2012-07-08: 2 mg via INTRAVENOUS

## 2012-07-08 MED ORDER — NEOSTIGMINE METHYLSULFATE 1 MG/ML IJ SOLN
INTRAMUSCULAR | Status: DC | PRN
  Administered 2012-07-08: 3 mg via INTRAVENOUS

## 2012-07-08 MED ORDER — ONDANSETRON HCL 4 MG/2ML IJ SOLN
INTRAMUSCULAR | Status: DC | PRN
  Administered 2012-07-08: 4 mg via INTRAVENOUS

## 2012-07-08 SURGICAL SUPPLY — 32 items
BRUSH CYTOL CELLEBRITY 1.5X140 (MISCELLANEOUS) IMPLANT
BRUSH SUPERTRAX BIOPSY (INSTRUMENTS) IMPLANT
BRUSH SUPERTRAX NDL-TIP CYTO (INSTRUMENTS) ×2 IMPLANT
CANISTER SUCTION 2500CC (MISCELLANEOUS) ×2 IMPLANT
CHANNEL WORK EXTEND EDGE 180 (KITS) IMPLANT
CHANNEL WORK EXTEND EDGE 45 (KITS) IMPLANT
CHANNEL WORK EXTEND EDGE 90 (KITS) IMPLANT
CLOTH BEACON ORANGE TIMEOUT ST (SAFETY) ×2 IMPLANT
CONT SPEC 4OZ CLIKSEAL STRL BL (MISCELLANEOUS) ×2 IMPLANT
COVER TABLE BACK 60X90 (DRAPES) ×2 IMPLANT
FILTER STRAW FLUID ASPIR (MISCELLANEOUS) IMPLANT
FORCEPS BIOP SUPERTRX PREMAR (INSTRUMENTS) ×2 IMPLANT
GLOVE BIOGEL M STRL SZ7.5 (GLOVE) ×4 IMPLANT
KIT LOCATABLE GUIDE (CANNULA) IMPLANT
KIT MARKER FIDUCIAL DELIVERY (KITS) IMPLANT
KIT PROCEDURE EDGE 180 (KITS) IMPLANT
KIT PROCEDURE EDGE 45 (KITS) ×2 IMPLANT
KIT PROCEDURE EDGE 90 (KITS) IMPLANT
KIT ROOM TURNOVER OR (KITS) ×2 IMPLANT
MARKER SKIN DUAL TIP RULER LAB (MISCELLANEOUS) ×2 IMPLANT
NEEDLE SUPERTRX PREMARK BIOPSY (NEEDLE) IMPLANT
NS IRRIG 1000ML POUR BTL (IV SOLUTION) ×2 IMPLANT
OIL SILICONE PENTAX (PARTS (SERVICE/REPAIRS)) ×2 IMPLANT
PAD ARMBOARD 7.5X6 YLW CONV (MISCELLANEOUS) ×4 IMPLANT
PATCHES PATIENT (LABEL) ×2 IMPLANT
SPONGE GAUZE 4X4 12PLY (GAUZE/BANDAGES/DRESSINGS) ×2 IMPLANT
SYR 20CC LL (SYRINGE) ×2 IMPLANT
SYR 20ML ECCENTRIC (SYRINGE) ×2 IMPLANT
SYRINGE TOOMEY DISP (SYRINGE) ×2 IMPLANT
TOWEL OR 17X24 6PK STRL BLUE (TOWEL DISPOSABLE) ×2 IMPLANT
TRAP SPECIMEN MUCOUS 40CC (MISCELLANEOUS) ×2 IMPLANT
TUBE CONNECTING 12X1/4 (SUCTIONS) ×2 IMPLANT

## 2012-07-08 NOTE — Anesthesia Procedure Notes (Signed)
Procedure Name: Intubation Date/Time: 07/08/2012 7:43 AM Performed by: Jerilee Hoh Pre-anesthesia Checklist: Patient identified, Emergency Drugs available, Suction available and Patient being monitored Patient Re-evaluated:Patient Re-evaluated prior to inductionOxygen Delivery Method: Circle system utilized Preoxygenation: Pre-oxygenation with 100% oxygen Intubation Type: IV induction Ventilation: Mask ventilation without difficulty and Oral airway inserted - appropriate to patient size Grade View: Grade III Tube type: Oral Tube size: 8.5 mm Number of attempts: 2 Airway Equipment and Method: Video-laryngoscopy and Rigid stylet Placement Confirmation: ETT inserted through vocal cords under direct vision,  positive ETCO2 and breath sounds checked- equal and bilateral Secured at: 22 cm Tube secured with: Tape Dental Injury: Teeth and Oropharynx as per pre-operative assessment  Difficulty Due To: Difficulty was anticipated, Difficult Airway- due to anterior larynx and Difficult Airway- due to limited oral opening Future Recommendations: Recommend- induction with short-acting agent, and alternative techniques readily available Comments: Easy mask airway with OAW.  Glidescope used d/t anticipated difficult airway.  DL by CRNA, very limited view with Glidescope.  Attempted to intubate unsuccessfully.  DL by MDA, Grade III view.  Atraumatic oral intubation.

## 2012-07-08 NOTE — Interval H&P Note (Signed)
PCCM Interval Hx:   Pt with MM followed by Dr Gaylyn Rong, RLL nodule of unclear etiology. After discussion with Dr Alla German, TCTS, it was agreed that the most expedient way to get a dx, move to either surgical rx or chemo for MM would be FOB + ENB with bx's. The patient understands and agrees.   Filed Vitals:   07/08/12 0551  BP: 151/84  Pulse: 78  Temp: 98 F (36.7 C)  TempSrc: Oral  Resp: 20  SpO2: 98%    Recent Labs Lab 07/07/12 1353  HGB 11.5*  HCT 34.2*  WBC 3.5*  PLT 191    Recent Labs Lab 07/07/12 1353  NA 139  K 4.2  CL 103  CO2 27  GLUCOSE 178*  BUN 17  CREATININE 0.65  CALCIUM 10.1    Recent Labs Lab 07/07/12 1353  INR 0.95   CXR 07/07/12 --  *RADIOLOGY REPORT*  Clinical Data: Hypertension, preadmission for bronchoscopy  CHEST - 2 VIEW  Comparison: 05/04/2012 CT scan 06/14/2012  Findings: Cardiomediastinal silhouette is stable. No acute  infiltrate or pulmonary edema. A lung nodule is noted in the right  lower lobe measures 1.5 cm. IMPRESSION:  No active disease. Right lower lobe nodule measures 1.5 cm. This  was noted on prior CT scan of 06/14/2012.  Plan:  - will proceed with FOB + ENB and bx's this am.  - no barriers, all questions answered  Levy Pupa, MD, PhD 07/08/2012, 7:07 AM  [facility]ulmonary [facility] Care [phone] or if no answer [phone]

## 2012-07-08 NOTE — Anesthesia Postprocedure Evaluation (Signed)
  Anesthesia Post-op Note  Patient: [name]  Procedure(s) Performed: Procedure(s) (LRB): VIDEO BRONCHOSCOPY WITH ENDOBRONCHIAL NAVIGATION (Right)  Patient Location: PACU  Anesthesia Type: General  Level of Consciousness: awake and alert   Airway and Oxygen Therapy: Patient Spontanous Breathing  Post-op Pain: mild  Post-op Assessment: Post-op Vital signs reviewed, Patient's Cardiovascular Status Stable, Respiratory Function Stable, Patent Airway and No signs of Nausea or vomiting  Last Vitals:  Filed Vitals:   07/08/12 0959  BP: 120/69  Pulse: 75  Temp: 36.6 C  Resp: 16    Post-op Vital Signs: stable   Complications: No apparent anesthesia complications

## 2012-07-08 NOTE — Preoperative (Signed)
Beta Blockers   Reason not to administer Beta Blockers:Not Applicable 

## 2012-07-08 NOTE — H&P (View-Only) (Signed)
Subjective:    Patient ID: [name], female    DOB: 02/11/1945, 67 y.o.   MRN: [id number]  HPI 67 yo woman, former smoker, DM2, OSA, followed by Dr Lamonte Sakai for multiple myeloma. She is referred for evaluation of a 1.2x1.6cm RLL nodule. She has not yet had a PET. She has been fatigued and weak.   Review of Systems  Constitutional: Negative for fever and unexpected weight change.  HENT: Positive for congestion, sneezing and postnasal drip. Negative for ear pain, nosebleeds, sore throat, rhinorrhea, trouble swallowing, dental problem and sinus pressure.   Eyes: Negative for redness and itching.  Respiratory: Negative for cough, chest tightness, shortness of breath and wheezing.   Cardiovascular: Negative for palpitations and leg swelling.  Gastrointestinal: Negative for nausea and vomiting.  Genitourinary: Negative for dysuria.  Musculoskeletal: Negative for joint swelling.  Skin: Negative for rash.  Neurological: Negative for headaches.  Hematological: Does not bruise/bleed easily.  Psychiatric/Behavioral: Negative for dysphoric mood. The patient is not nervous/anxious.     Past Medical History  Diagnosis Date  . Depression   . Osteoporosis   . Hypercholesterolemia   . Esophageal reflux disease   . Hypertension   . Diabetes type 2, controlled   . Sleep apnea   . Pancytopenia   . Ischemic colitis 2009  . Monoclonal gammopathy   . Lung nodule 06/11/2012     Family History  Problem Relation Age of Onset  . Heart attack Father   . Cancer Sister     synovial sarcoma  . Cancer Maternal Grandfather     ? cancer     History   Social History  . Marital Status: Divorced    Spouse Name: N/A    Number of Children: 2  . Years of Education: N/A   Occupational History  . retired     adm. [employer]   Social History Main Topics  . Smoking status: Former Smoker -- 1.00 packs/day for 30 years    Types: Cigarettes    Quit date: 01/27/2009  . Smokeless tobacco: Never Used  .  Alcohol Use: 1.8 oz/week    3 Glasses of wine per week     Comment: 1 bottle per wine per night at times  . Drug Use: No  . Sexually Active: Not on file   Other Topics Concern  . Not on file   Social History Narrative  . No narrative on file     No Known Allergies   Outpatient Prescriptions Prior to Visit  Medication Sig Dispense Refill  . buPROPion (WELLBUTRIN XL) 300 MG 24 hr tablet Take 300 mg by mouth daily.      Marland Kitchen esomeprazole (NEXIUM) 40 MG capsule Take 40 mg by mouth 2 (two) times daily.       Marland Kitchen losartan (COZAAR) 50 MG tablet Take 50 mg by mouth daily.      . metformin (FORTAMET) 500 MG (OSM) 24 hr tablet Take 1,500 mg by mouth. daily      . metoprolol succinate (TOPROL-XL) 50 MG 24 hr tablet Take 50 mg by mouth daily. Take with or immediately following a meal.      . Multiple Vitamins-Minerals (MULTIVITAMIN WITH MINERALS) tablet Take 1 tablet by mouth daily.      . sertraline (ZOLOFT) 100 MG tablet Take 100 mg by mouth daily.      . simvastatin (ZOCOR) 40 MG tablet Take 40 mg by mouth every evening.       No facility-administered medications prior  to visit.         Objective:   Physical Exam Filed Vitals:   06/22/12 1342  BP: 120/72  Pulse: 81  Temp: 98 F (36.7 C)  TempSrc: Oral  Height: 5' 6"  (1.676 m)  Weight: 210 lb 12.8 oz (95.618 kg)  SpO2: 98%   Gen: Pleasant, overwt, in no distress,  normal affect  ENT: No lesions,  mouth clear,  oropharynx clear, no postnasal drip  Neck: No JVD, no TMG, no carotid bruits  Lungs: No use of accessory muscles, no dullness to percussion, clear without rales or rhonchi  Cardiovascular: RRR, heart sounds normal, no murmur or gallops, no peripheral edema  Musculoskeletal: No deformities, no cyanosis or clubbing  Neuro: alert, non focal  Skin: Warm, no lesions or rashes      Assessment & Plan:  Lung nodule RLL nodule. The case is complicated by new dx of MM. I think she would be a candidate for VATS and  primary resection if PET were consistent with stage 1 disease. The MM might throw [place] off and cause [place] to up-stage her on PET. Also, time is important here - need to start MM therapy asap. The lesion is certainly reachable by FOB/ENB. i will review case with TCTS, decide whether to get PET and possible VATS vs go straight to FOB. Will call her to arrange.

## 2012-07-08 NOTE — Progress Notes (Signed)
MD viewed xray, states it looks fine

## 2012-07-08 NOTE — Op Note (Signed)
Video Bronchoscopy with Electromagnetic Navigation Procedure Note  Date of Operation: 07/08/2012  Pre-op Diagnosis: RLL nodule  Post-op Diagnosis: same  Surgeon: Levy Pupa  Assistants: none  Anesthesia: General endotracheal anesthesia  Operation: Flexible video fiberoptic bronchoscopy with electromagnetic navigation and biopsies.  Estimated Blood Loss: Minimal  Complications: none apparent  Indications and History: [name] is a 67 y.o. female with a history of multiple myeloma. She was found to have a RLL nodule on CT scan of the chest. Decision was made to pursue biopsy via bronchoscopy with electromagnetic navigation.  The risks, benefits, complications, treatment options and expected outcomes were discussed with the patient.  The possibilities of pneumothorax, pneumonia, reaction to medication, pulmonary aspiration, perforation of a viscus, bleeding, failure to diagnose a condition and creating a complication requiring transfusion or operation were discussed with the patient who freely signed the consent.    Description of Procedure: The patient was seen in the Preoperative Area, was examined and was deemed appropriate to proceed.  The patient was taken to OR 10, identified as [name] and the procedure verified as Flexible Video Fiberoptic Bronchoscopy.  A Time Out was held and the above information confirmed.   Prior to the date of the procedure a high-resolution CT scan of the chest was performed. Utilizing superDimension software a virtual tracheobronchial tree was generated to allow the creation of distinct navigation pathways to the patient's RLL nodule. After being taken to the operating room general anesthesia was initiated and the patient  was orally intubated. The video fiberoptic bronchoscope was introduced via the endotracheal tube and a general inspection was performed which showed norman airways throughout. The extendable working channel and locator guide were  introduced into the bronchoscope. The distinct navigation pathways prepared prior to this procedure were then utilized to navigate to within 0.5 - 1.0cm of patient's lesion identified on CT scan. The extendable working channel was secured into place and the locator guide was withdrawn. Under fluoroscopic guidance transbronchial needle brushings, transbronchial Wang needle biopsies, and transbronchial forceps biopsies were performed to be sent for cytology and pathology. A bronchioalveolar lavage was performed in the RLL and sent for cytology, FLOW cytometry and microbiology (bacterial, fungal, AFB smears and cultures). At the end of the procedure a general airway inspection was performed and there was no evidence of active bleeding. The bronchoscope was removed.  The patient tolerated the procedure well. There was no significant blood loss and there were no obvious complications. A post-procedural chest x-ray is pending.  Samples: 1. Transbronchial needle brushings from RLL 2. Transbronchial Wang needle biopsies from RLL 3. Transbronchial forceps biopsies from RLL 4. Bronchoalveolar lavage from RLL  Plans:  The patient will be discharged from the PACU to home when recovered from anesthesia and after chest x-ray is reviewed. We will review the cytology, pathology and microbiology results with the patient when they become available. Outpatient followup will be with Dr Delton Coombes and Dr Gaylyn Rong.    Levy Pupa, MD, PhD 07/08/2012, 8:48 AM [place] [facility] [phone] or if no answer [phone]

## 2012-07-08 NOTE — Anesthesia Preprocedure Evaluation (Addendum)
Anesthesia Evaluation  Patient identified by MRN, date of birth, ID band Patient awake    Reviewed: Allergy & Precautions, H&P , NPO status , Patient's Chart, lab work & pertinent test results, reviewed documented beta blocker date and time   Airway Mallampati: II TM Distance: >3 FB Neck ROM: full    Dental no notable dental hx. (+) Teeth Intact and Dental Advisory Given   Pulmonary sleep apnea , former smoker,  Pulmonary nodule LUL. 30 py former smoker breath sounds clear to auscultation  Pulmonary exam normal       Cardiovascular Exercise Tolerance: Good hypertension, Pt. on home beta blockers Rhythm:regular Rate:Normal     Neuro/Psych negative neurological ROS  negative psych ROS   GI/Hepatic negative GI ROS, Neg liver ROS, Medicated and Controlled,  Endo/Other  diabetes, Well Controlled, Type 2, Oral Hypoglycemic AgentsMorbid obesity  Renal/GU negative Renal ROS  negative genitourinary   Musculoskeletal   Abdominal (+) + obese,   Peds  Hematology negative hematology ROS (+) Blood dyscrasia, , Multiple myeloma   Anesthesia Other Findings   Reproductive/Obstetrics negative OB ROS                        Anesthesia Physical Anesthesia Plan  ASA: III  Anesthesia Plan: General   Post-op Pain Management:    Induction: Intravenous  Airway Management Planned: Oral ETT  Additional Equipment:   Intra-op Plan:   Post-operative Plan: Extubation in OR  Informed Consent: I have reviewed the patients History and Physical, chart, labs and discussed the procedure including the risks, benefits and alternatives for the proposed anesthesia with the patient or authorized representative who has indicated his/her understanding and acceptance.   Dental Advisory Given  Plan Discussed with: CRNA and Surgeon  Anesthesia Plan Comments:         Anesthesia Quick Evaluation

## 2012-07-08 NOTE — Transfer of Care (Signed)
Immediate Anesthesia Transfer of Care Note  Patient: [name]  Procedure(s) Performed: Procedure(s): VIDEO BRONCHOSCOPY WITH ENDOBRONCHIAL NAVIGATION (Right)  Patient Location: PACU  Anesthesia Type:General  Level of Consciousness: awake, alert , oriented and patient cooperative  Airway & Oxygen Therapy: Patient Spontanous Breathing and Patient connected to face mask oxygen  Post-op Assessment: Report given to PACU RN, Post -op Vital signs reviewed and stable and Patient moving all extremities  Post vital signs: Reviewed and stable  Complications: No apparent anesthesia complications

## 2012-07-09 ENCOUNTER — Encounter: Payer: Self-pay | Admitting: Oncology

## 2012-07-09 ENCOUNTER — Telehealth: Payer: Self-pay | Admitting: Emergency Medicine

## 2012-07-09 NOTE — Telephone Encounter (Signed)
Ok will do.

## 2012-07-09 NOTE — Telephone Encounter (Signed)
RB  Pt was calling and wanted to make sure that Dr. Gaylyn Rong, and Dr. Aliene Altes at [facility] gets copies of results from yesterday. thanks

## 2012-07-10 LAB — CULTURE, RESPIRATORY W GRAM STAIN

## 2012-07-13 ENCOUNTER — Telehealth: Payer: Self-pay | Admitting: Emergency Medicine

## 2012-07-13 ENCOUNTER — Encounter (HOSPITAL_COMMUNITY): Payer: Self-pay | Admitting: Emergency Medicine

## 2012-07-13 NOTE — Telephone Encounter (Signed)
Pt is requesting biopsy results. Also wants the results sent to Dr at [facility], see message dated 07-09-12. Please advise.  Carron Curie, CMA

## 2012-07-13 NOTE — Telephone Encounter (Signed)
Reviewed bx results w the patient - shows adenoCA, presumed primary lung. She has appointment w Dr Gaylyn Rong as well as Dr Aliene Altes at [facility]. I will send the bx results to them.

## 2012-07-15 ENCOUNTER — Ambulatory Visit (HOSPITAL_BASED_OUTPATIENT_CLINIC_OR_DEPARTMENT_OTHER): Payer: Medicare Other | Admitting: Oncology

## 2012-07-15 ENCOUNTER — Other Ambulatory Visit (HOSPITAL_BASED_OUTPATIENT_CLINIC_OR_DEPARTMENT_OTHER): Payer: Medicare Other | Admitting: Lab

## 2012-07-15 DIAGNOSIS — C9000 Multiple myeloma not having achieved remission: Secondary | ICD-10-CM

## 2012-07-15 DIAGNOSIS — C3490 Malignant neoplasm of unspecified part of unspecified bronchus or lung: Secondary | ICD-10-CM

## 2012-07-15 DIAGNOSIS — D472 Monoclonal gammopathy: Secondary | ICD-10-CM

## 2012-07-15 DIAGNOSIS — R911 Solitary pulmonary nodule: Secondary | ICD-10-CM

## 2012-07-15 LAB — CBC WITH DIFFERENTIAL/PLATELET
BASO%: 0.6 % (ref 0.0–2.0)
EOS%: 3.5 % (ref 0.0–7.0)
LYMPH%: 32.6 % (ref 14.0–49.7)
MCHC: 34 g/dL (ref 31.5–36.0)
MCV: 95.8 fL (ref 79.5–101.0)
MONO%: 7 % (ref 0.0–14.0)
NEUT#: 2 10*3/uL (ref 1.5–6.5)
Platelets: 203 10*3/uL (ref 145–400)
RBC: 3.41 10*6/uL — ABNORMAL LOW (ref 3.70–5.45)
RDW: 14.7 % — ABNORMAL HIGH (ref 11.2–14.5)

## 2012-07-15 NOTE — Patient Instructions (Addendum)
1.  Diagnosis #1:  Adenocarcinoma nonsmall cell lung cancer.  Stage:  Most likely stage I according to CT scan.  Need to confirm stage I with PET scan.  Treatment for stage I:  Lobectomy and no need for chemo.  If stage II or higher, may consider chemo after lobectomy to decrease risk of recurrence.  2.  Diagnosis #2:  Myeloma:  Much more slow growing.  Delay treatment until finish treatment of lung cancer.

## 2012-07-15 NOTE — Progress Notes (Signed)
[facility]  Telephone:([phone]) [phone] Fax:(336) [phone]   OFFICE PROGRESS NOTE   Cc:  [name], MD  DIAGNOSIS:  1.  IgG kappa multiple myeloma. She presented in 05/2012 with anemia, Cr 0.8, Ca 9.6; no bone lytic lesion; LDH 140; beta2- microglobulin 1.86 (ref 1.01-1.73). Positive SPEP for M-spike at 1.69, IgG 2080, kappa 7.24, kappa:lambda ratio 24.97; UPEP positive for Bence Jones protein. Bone marrow biopsy on 06/08/12 showed 19% plasma cell; normal cytogenetics; myeloma FISH pending.   2.  Nonsmall cell lung cancer, adenocarcinoma; staging pending PET scan result.   PAST THERAPY: biopsy only.   CURRENT THERAPY:  To be determined.   INTERVAL HISTORY: [name] 67 y.o. female returns for consolidate her work up.  She has mild fatigue.  However, she is independent of all activities of daily living.    Patient denies fever, anorexia, weight loss, headache, visual changes, confusion, drenching night sweats, palpable lymph node swelling, mucositis, odynophagia, dysphagia, nausea vomiting, jaundice, chest pain, palpitation, shortness of breath, dyspnea on exertion, productive cough, gum bleeding, epistaxis, hematemesis, hemoptysis, abdominal pain, abdominal swelling, early satiety, melena, hematochezia, hematuria, skin rash, spontaneous bleeding, joint swelling, joint pain, heat or cold intolerance, bowel bladder incontinence, back pain, focal motor weakness, paresthesia.    Past Medical History  Diagnosis Date  . Osteoporosis   . Hypercholesterolemia   . Esophageal reflux disease   . Hypertension   . Diabetes type 2, controlled   . Pancytopenia   . Ischemic colitis 2009  . Lung nodule 06/11/2012  . Depression   . Sleep apnea     UPPER AIRWAY RESISTANCE  DOES NOT HAVE CPAP , MAY NEED   . Tuberculosis      (+ TB TEST)YEARS AGO POSSIBLE EXPOSURE WAS MED TX   . Multiple myeloma 05/2012    Cytogenetics on 06/08/2012 was normal 46, XX [20]  . Liver enzyme  elevation     Past Surgical History  Procedure Laterality Date  . Colonoscopy  2013    poly; Dr. Randa Evens, next one due 2018.l  . Appendectomy    . Tonsillectomy    . Leg surgery    . Video bronchoscopy with endobronchial navigation Right 07/08/2012    Procedure: VIDEO BRONCHOSCOPY WITH ENDOBRONCHIAL NAVIGATION;  Surgeon: Leslye Peer, MD;  Location: [facility] OR;  Service: Thoracic;  Laterality: Right;    Current Outpatient Prescriptions  Medication Sig Dispense Refill  . buPROPion (WELLBUTRIN XL) 300 MG 24 hr tablet Take 300 mg by mouth daily.      . Cholecalciferol (VITAMIN D-3 PO) Take 1 tablet by mouth daily.      Marland Kitchen esomeprazole (NEXIUM) 40 MG capsule Take 40 mg by mouth 2 (two) times daily.       Marland Kitchen losartan (COZAAR) 50 MG tablet Take 50 mg by mouth daily.      . metformin (FORTAMET) 500 MG (OSM) 24 hr tablet Take 1,500 mg by mouth. daily      . metoprolol succinate (TOPROL-XL) 50 MG 24 hr tablet Take 50 mg by mouth daily. Take with or immediately following a meal.      . Multiple Vitamins-Minerals (MULTIVITAMIN WITH MINERALS) tablet Take 1 tablet by mouth daily.      . Omega-3 Fatty Acids (FISH OIL PO) Take 1 capsule by mouth daily.      . sertraline (ZOLOFT) 100 MG tablet Take 100 mg by mouth daily.      . simvastatin (ZOCOR) 40 MG tablet Take 40 mg  by mouth every evening.       No current facility-administered medications for this visit.    ALLERGIES:  has No Known Allergies.  REVIEW OF SYSTEMS:  The rest of the 14-point review of system was negative.   Filed Vitals:   07/15/12 0928  BP: 145/69  Pulse: 82  Temp: 98.3 F (36.8 C)  Resp: 17   Wt Readings from Last 3 Encounters:  07/15/12 209 lb 12.8 oz (95.165 kg)  07/07/12 208 lb 1.8 oz (94.4 kg)  06/22/12 210 lb 12.8 oz (95.618 kg)   ECOG Performance status: 0-1  PHYSICAL EXAMINATION:   General:  well-nourished woman, in no acute distress.  Eyes:  no scleral icterus.  ENT:  There were no oropharyngeal lesions.   Neck was without thyromegaly.  Lymphatics:  Negative cervical, supraclavicular or axillary adenopathy.  Respiratory: lungs were clear bilaterally without wheezing or crackles.  Cardiovascular:  Regular rate and rhythm, S1/S2, without murmur, rub or gallop.  There was no pedal edema.  GI:  abdomen was soft, flat, nontender, nondistended, without organomegaly.  Muscoloskeletal:  no spinal tenderness of palpation of vertebral spine.  Skin exam was without echymosis, petichae.  Neuro exam was nonfocal.  Patient was able to get on and off exam table without assistance.  Gait was normal.  Patient was alert and oriented.  Attention was good.   Language was appropriate.  Mood was normal without depression.  Speech was not pressured.  Thought content was not tangential.      LABORATORY/RADIOLOGY DATA:  Lab Results  Component Value Date   WBC 3.5* 07/15/2012   HGB 11.1* 07/15/2012   HCT 32.7* 07/15/2012   PLT 203 07/15/2012   GLUCOSE 178* 07/07/2012   ALKPHOS 94 07/07/2012   ALT 43* 07/07/2012   AST 38* 07/07/2012   NA 139 07/07/2012   K 4.2 07/07/2012   CL 103 07/07/2012   CREATININE 0.65 07/07/2012   BUN 17 07/07/2012   CO2 27 07/07/2012   INR 0.95 07/07/2012     ASSESSMENT AND PLAN:    1.  Diagnosis #1:  Adenocarcinoma nonsmall cell lung cancer.  Stage:  Most likely stage I according to CT scan.  Need to confirm stage I with PET scan per [facility].  Treatment for stage I:  Lobectomy (or gamma knife per discussion with [facility]) and no need for chemo.  If stage II or higher, may consider chemo after lobectomy to decrease risk of recurrence.   2.  Diagnosis #2:  Myeloma:  Much more slow growing.  Delay treatment until finish treatment of lung cancer.   I informed Ms. Garguilo that I am leaving the practice.  She had already arranged to transfer her care to [facility] with Dr. Loney Hering for lung cancer and Dr. Kerri Perches for myeloma.     The length of time of the face-to-face encounter was 15 minutes. More than  50% of time was spent counseling and coordination of care.

## 2012-07-26 DIAGNOSIS — C3430 Malignant neoplasm of lower lobe, unspecified bronchus or lung: Secondary | ICD-10-CM | POA: Insufficient documentation

## 2012-08-04 DIAGNOSIS — E119 Type 2 diabetes mellitus without complications: Secondary | ICD-10-CM | POA: Insufficient documentation

## 2012-08-04 DIAGNOSIS — F339 Major depressive disorder, recurrent, unspecified: Secondary | ICD-10-CM | POA: Insufficient documentation

## 2012-08-04 DIAGNOSIS — K219 Gastro-esophageal reflux disease without esophagitis: Secondary | ICD-10-CM | POA: Insufficient documentation

## 2012-08-04 DIAGNOSIS — F329 Major depressive disorder, single episode, unspecified: Secondary | ICD-10-CM | POA: Insufficient documentation

## 2012-08-04 DIAGNOSIS — I10 Essential (primary) hypertension: Secondary | ICD-10-CM | POA: Insufficient documentation

## 2012-08-04 DIAGNOSIS — M1990 Unspecified osteoarthritis, unspecified site: Secondary | ICD-10-CM | POA: Insufficient documentation

## 2012-08-04 DIAGNOSIS — F32A Depression, unspecified: Secondary | ICD-10-CM | POA: Insufficient documentation

## 2012-08-04 HISTORY — DX: Gastro-esophageal reflux disease without esophagitis: K21.9

## 2012-08-06 LAB — FUNGUS CULTURE W SMEAR: Fungal Smear: NONE SEEN

## 2012-08-19 ENCOUNTER — Encounter: Payer: Self-pay | Admitting: *Deleted

## 2012-08-19 NOTE — Progress Notes (Signed)
Letter received from Dr. Armandina Stammer at [facility].  He performed a right lower lobectomy and mediastinal lymph node dissection on pt on 08/12/12.  Letter placed for scanning into EMR.

## 2012-08-20 LAB — AFB CULTURE WITH SMEAR (NOT AT ARMC)

## 2012-08-26 ENCOUNTER — Telehealth: Payer: Self-pay | Admitting: Hematology and Oncology

## 2012-08-26 NOTE — Telephone Encounter (Signed)
Returned call re cancelling appt. Per pt cx all appts she has transferred her care to [facility] effective 7/1.

## 2012-08-31 ENCOUNTER — Other Ambulatory Visit: Payer: Medicare Other

## 2012-09-01 ENCOUNTER — Other Ambulatory Visit: Payer: Self-pay

## 2012-09-17 DIAGNOSIS — D3A090 Benign carcinoid tumor of the bronchus and lung: Secondary | ICD-10-CM | POA: Insufficient documentation

## 2012-09-21 ENCOUNTER — Telehealth: Payer: Self-pay | Admitting: Oncology

## 2012-09-21 ENCOUNTER — Telehealth: Payer: Self-pay | Admitting: *Deleted

## 2012-09-21 NOTE — Telephone Encounter (Signed)
VM from nurse at [facility].  States pt has been seen there and they will be sending over office note. They request pt start treatment here for Multiple Myeloma w/ 4 cycles of VDR.  Sent order to scheduling to have pt scheduled w/ office visit here w/i 2 weeks.  Left VM for nurse, Kriste Basque, informing her of pt to be scheduled to see MD here to get treatment ordered.  Asked her to send notes and to let us know if pt needs to be seen sooner.

## 2012-09-21 NOTE — Telephone Encounter (Signed)
s.w. pt and advised on Sept 5.14 appt...pt ok and aware

## 2012-09-23 ENCOUNTER — Telehealth: Payer: Self-pay | Admitting: *Deleted

## 2012-09-23 NOTE — Telephone Encounter (Signed)
Pt called to ask what chemo she is starting on next week?  Informed pt that appt on 9/05 is for lab/office visit only and then MD will order chemo if appropriate as recommended by [facility].  Discussed chemo regimen recommended is Velcade, Revlimid and Dexamethasone.  Will request chemo education class scheduled for pt.  She verbalized understanding.

## 2012-09-28 ENCOUNTER — Telehealth: Payer: Self-pay | Admitting: Hematology and Oncology

## 2012-09-28 NOTE — Telephone Encounter (Signed)
lmonvm for pt re appt for 9/8 and confirmed appt for 9/5. Pt to get new schedule 9/5.

## 2012-09-30 ENCOUNTER — Other Ambulatory Visit: Payer: Self-pay | Admitting: *Deleted

## 2012-10-01 ENCOUNTER — Ambulatory Visit (HOSPITAL_BASED_OUTPATIENT_CLINIC_OR_DEPARTMENT_OTHER): Payer: Medicare Other | Admitting: Hematology and Oncology

## 2012-10-01 ENCOUNTER — Encounter: Payer: Self-pay | Admitting: *Deleted

## 2012-10-01 ENCOUNTER — Telehealth: Payer: Self-pay | Admitting: *Deleted

## 2012-10-01 ENCOUNTER — Other Ambulatory Visit: Payer: Medicare Other | Admitting: Lab

## 2012-10-01 VITALS — BP 169/78 | HR 78 | Temp 97.8°F | Resp 20 | Ht 65.0 in | Wt 198.9 lb

## 2012-10-01 DIAGNOSIS — C3490 Malignant neoplasm of unspecified part of unspecified bronchus or lung: Secondary | ICD-10-CM

## 2012-10-01 DIAGNOSIS — C9000 Multiple myeloma not having achieved remission: Secondary | ICD-10-CM

## 2012-10-01 MED ORDER — LENALIDOMIDE 25 MG PO CAPS: 25.0000 mg | ORAL_CAPSULE | Freq: Every day | ORAL | Status: DC

## 2012-10-01 MED ORDER — DEXAMETHASONE 4 MG PO TABS: 40.0000 mg | ORAL_TABLET | ORAL | Status: DC

## 2012-10-01 MED ORDER — ACYCLOVIR 400 MG PO TABS: 400.0000 mg | ORAL_TABLET | Freq: Two times a day (BID) | ORAL | Status: DC

## 2012-10-01 NOTE — Telephone Encounter (Signed)
gv and printed appt sched and avs for pt for SEpt...MW to add tx.

## 2012-10-01 NOTE — Telephone Encounter (Signed)
Per staff message and POF I have scheduled appts.  JMW  

## 2012-10-01 NOTE — Progress Notes (Signed)
Rx for Revlimid given to [facility] in managed Care Dept.. Pt was given [employer] Pt Support financial form to fill out.  She will bring back in w/ her on Mon 9/08 to be given to Thelma Barge when completed.

## 2012-10-04 ENCOUNTER — Other Ambulatory Visit: Payer: Medicare Other

## 2012-10-08 ENCOUNTER — Telehealth: Payer: Self-pay | Admitting: *Deleted

## 2012-10-08 NOTE — Telephone Encounter (Signed)
Pt called to ask about her Revlimid that was prescribed and she is scheduled to start Velcade on Monday.  Informed pt that our managed care dept is working on obtaining prior auth for this drug and hopefully we will know the status of that when she comes in for office visit on Monday.  Informed her she is scheduled for Velcade but MD may choose to wait until Revlimid is obtained.  Instructed her to bring in the Patient Assistance Application that was given to her on last visit in case she needs to apply for co pay assistance.  She verbalized understanding.  She also asks if she is supposed to be started on Lovenox? This was told to her by Dr. Alecia Lemming.  Informed her this is likely and Dr. Rosie Fate will address this on office visit Monday 9/15 as well. Nurse can teach her how to administer it.  Asked pt to bring her meds in with her so nurse will be able to have accurate list of meds. Pt verbalized understanding.

## 2012-10-11 ENCOUNTER — Other Ambulatory Visit: Payer: Self-pay

## 2012-10-11 ENCOUNTER — Telehealth: Payer: Self-pay | Admitting: Internal Medicine

## 2012-10-11 ENCOUNTER — Other Ambulatory Visit: Payer: Medicare Other

## 2012-10-11 ENCOUNTER — Encounter: Payer: Self-pay | Admitting: Internal Medicine

## 2012-10-11 ENCOUNTER — Ambulatory Visit: Payer: Medicare Other

## 2012-10-11 ENCOUNTER — Ambulatory Visit (HOSPITAL_BASED_OUTPATIENT_CLINIC_OR_DEPARTMENT_OTHER): Payer: Medicare Other | Admitting: Internal Medicine

## 2012-10-11 VITALS — BP 144/72 | HR 80 | Temp 97.4°F | Resp 20 | Ht 65.0 in | Wt 198.9 lb

## 2012-10-11 DIAGNOSIS — C9000 Multiple myeloma not having achieved remission: Secondary | ICD-10-CM

## 2012-10-11 DIAGNOSIS — C9001 Multiple myeloma in remission: Secondary | ICD-10-CM | POA: Insufficient documentation

## 2012-10-11 MED ORDER — ENOXAPARIN SODIUM 40 MG/0.4ML ~~LOC~~ SOLN: 40.0000 mg | Freq: Every day | SUBCUTANEOUS | Status: DC

## 2012-10-11 NOTE — Patient Instructions (Addendum)
Enoxaparin injection What is this medicine? ENOXAPARIN (ee nox a PA rin) is used after knee, hip, or abdominal surgeries to prevent blood clotting. It is also used to treat existing blood clots in the lungs or in the veins. This medicine may be used for other purposes; ask your health care provider or pharmacist if you have questions. What should I tell my health care provider before I take this medicine? They need to know if you have any of these conditions: -bleeding disorders, hemorrhage, or hemophilia -infection of the heart or heart valves -kidney or liver disease -previous stroke -prosthetic heart valve -recent surgery or delivery of a baby -ulcer in the stomach or intestine, diverticulitis, or other bowel disease -an unusual or allergic reaction to enoxaparin, heparin, pork or pork products, other medicines, foods, dyes, or preservatives -pregnant or trying to get pregnant -breast-feeding How should I use this medicine? This medicine is for injection under the skin. It is usually given by a health-care professional. You or a family member may be trained on how to give the injections. If you are to give yourself injections, make sure you understand how to use the syringe, measure the dose if necessary, and give the injection. To avoid bruising, do not rub the site where this medicine has been injected. Do not take your medicine more often than directed. Do not stop taking except on the advice of your doctor or health care professional. Make sure you receive a puncture-resistant container to dispose of the needles and syringes once you have finished with them. Do not reuse these items. Return the container to your doctor or health care professional for proper disposal. Talk to your pediatrician regarding the use of this medicine in children. Special care may be needed. Overdosage: If you think you have taken too much of this medicine contact a poison control center or emergency room at  once. NOTE: This medicine is only for you. Do not share this medicine with others. What if I miss a dose? If you miss a dose, take it as soon as you can. If it is almost time for your next dose, take only that dose. Do not take double or extra doses. What may interact with this medicine? Do not take this medicine with any of the following medications: -aspirin and aspirin-like medicines -heparin -mifepristone -warfarin This medicine may also interact with the following medications: -cilostazol -clopidogrel -dipyridamole -NSAIDs, medicines for pain and inflammation, like ibuprofen or naproxen -sulfinpyrazone -ticlopidine This list may not describe all possible interactions. Give your health care provider a list of all the medicines, herbs, non-prescription drugs, or dietary supplements you use. Also tell them if you smoke, drink alcohol, or use illegal drugs. Some items may interact with your medicine. What should I watch for while using this medicine? Visit your doctor or health care professional for regular checks on your progress. Your condition will be monitored carefully while you are receiving this medicine. Contact your doctor or health care professional and seek emergency treatment if you develop increased difficulty in breathing, chest pain, dizziness, shortness of breath, swelling in the legs or arms, abdominal pain, decreased vision, pain when walking, or pain and warmth of the arms or legs. These can be signs that your condition has gotten worse. Monitor your skin closely for easy bruising or red spots, which can be signs of bleeding. If you notice easy bruising or minor bleeding from the nose, gums/teeth, in your urine, or stool, contact your doctor or health care professional  right away. The dose of your medicine may need to be changed. If you are going to have surgery, tell your doctor or health care professional that you are taking this medicine. Try to avoid injury while you are  using this medicine. Be careful when brushing or flossing your teeth, shaving, cutting your fingernails or toenails, or when using sharp objects. Report any injuries to your doctor or health care professional. What side effects may I notice from receiving this medicine? Side effects that you should report to your doctor or health care professional as soon as possible: -allergic reactions like skin rash, itching or hives, swelling of the face, lips, or tongue -black, tarry stools -breathing problems -dark urine -feeling faint or lightheaded, falls -fever -heavy menstrual bleeding -unusual bruising or bleeding Side effects that usually do not require medical attention (report to your doctor or health care professional if they continue or are bothersome): -pain or irritation at the injection site This list may not describe all possible side effects. Call your doctor for medical advice about side effects. You may report side effects to FDA at 1-800-FDA-1088. Where should I keep my medicine? Keep out of the reach of children. Store at room temperature between 15 and 30 degrees C (59 and 86 degrees F). Do not freeze. If your injections have been specially prepared, you may need to store them in the refrigerator. Ask your pharmacist. Throw away any unused medicine after the expiration date. NOTE: This sheet is a summary. It may not cover all possible information. If you have questions about this medicine, talk to your doctor, pharmacist, or health care provider.  2013, [employer]. (03/26/2007 4:47:37 PM) Lenalidomide Oral Capsules What is this medicine? LENALIDOMIDE (len a LID oh mide) is used to treat anemia from a myelodysplastic syndrome. It is also used to treat multiple myeloma. The medicine may help you to need blood transfusions less often. This medicine may be used for other purposes; ask your health care provider or pharmacist if you have questions. What should I tell my health  care provider before I take this medicine? They need to know if you have any of these conditions: -blood clots in the legs or the lungs -infection -irregular monthly periods or menstrual cycles -kidney disease -an unusual or allergic reaction to lenalidomide, other medicines, foods, dyes, or preservatives -pregnant or trying to get pregnant -breast-feeding How should I use this medicine? Take this medicine by mouth with a glass of water. Follow the directions on the prescription label. Do not cut, crush, or chew this medicine. Take your medicine at regular intervals. Do not take it more often than directed. Do not stop taking except on your doctor's advice. A MedGuide will be given with each prescription and refill. Read this guide carefully each time. The MedGuide may change frequently. Talk to your pediatrician regarding the use of this medicine in children. Special care may be needed. Overdosage: If you think you have taken too much of this medicine contact a poison control center or emergency room at once. NOTE: This medicine is only for you. Do not share this medicine with others. What if I miss a dose? If you miss a dose, take it as soon as you can. If your next dose is to be taken in less than 12 hours, then do not take the missed dose. Take the next dose at your regular time. Do not take double or extra doses. What may interact with this medicine? -vaccines This list may not  describe all possible interactions. Give your health care provider a list of all the medicines, herbs, non-prescription drugs, or dietary supplements you use. Also tell them if you smoke, drink alcohol, or use illegal drugs. Some items may interact with your medicine. What should I watch for while using this medicine? Visit your doctor for regular check ups. Tell your doctor or healthcare professional if your symptoms do not start to get better or if they get worse. You will need to have important blood work done  while you are taking this medicine. This medicine is available only through a special program. Doctors, pharmacies, and patients must meet all of the conditions of the program. Your health care provider will help you get signed up with the program if you need this medicine. Through the program you will only receive up to a 28 day supply of the medicine at one time. You will need a new prescription for each refill. This medicine can cause birth defects. Do not get pregnant while taking this drug. Females with child-bearing potential will need to have 2 negative pregnancy tests before starting this medicine. Pregnancy testing must be done every 2 to 4 weeks as directed while taking this medicine. Use 2 reliable forms of birth control together while you are taking this medicine and for 1 month after you stop taking this medicine. If you think that you might be pregnant talk to your doctor right away. Men must use a latex condom during sexual contact with a woman while taking this medicine and for 28 days after you stop taking this medicine. A latex condom is needed even if you have had a vasectomy. Contact your doctor right away if your partner becomes pregnant. Do not donate sperm while taking this medicine and for 28 days after you stop taking this medicine. Do not give blood while taking the medicine and for 1 month after completion of treatment to avoid exposing pregnant women to the medicine through the donated blood. Talk to your doctor about your risk of cancer. You may be more at risk for certain types of cancers if you take this medicine. You may need blood work done while you are taking this medicine. What side effects may I notice from receiving this medicine? Side effects that you should report to your doctor or health care professional as soon as possible: -allergic reactions like skin rash, itching or hives, swelling of the face, lips, or tongue -bloody or dark, tarry stools -breathing  problems -chest pain -dark urine -fever, infection, runny nose, or sore throat -pain in the legs -swelling of your hands, ankles or leg -unusual bleeding or bruising Side effects that usually do not require medical attention (report to your doctor or health care professional if they continue or are bothersome): -diarrhea -tiredness This list may not describe all possible side effects. Call your doctor for medical advice about side effects. You may report side effects to FDA at 1-800-FDA-1088. Where should I keep my medicine? Keep out of the reach of children. Store at room temperature between 15 and 30 degrees C (59 and 86 degrees F). Throw away any unused medicine after the expiration date. NOTE: This sheet is a summary. It may not cover all possible information. If you have questions about this medicine, talk to your doctor, pharmacist, or health care provider.  2013, [employer]. (03/13/2011 1:28:34 PM) Bortezomib injection What is this medicine? BORTEZOMIB (bor TEZ oh mib) is a chemotherapy drug. It slows the growth of cancer cells.  This medicine is used to treat multiple myeloma, lymphoma, and other cancers. This medicine may be used for other purposes; ask your health care provider or pharmacist if you have questions. What should I tell my health care provider before I take this medicine? They need to know if you have any of these conditions: -heart disease -irregular heartbeat -liver disease -low blood counts, like low white blood cells, platelets, or hemoglobin -peripheral neuropathy -taking medicine for blood pressure -an unusual or allergic reaction to bortezomib, mannitol, boron, other medicines, foods, dyes, or preservatives -pregnant or trying to get pregnant -breast-feeding How should I use this medicine? This medicine is for injection into a vein or for injection under the skin. It is given by a health care professional in a hospital or clinic setting. Talk  to your pediatrician regarding the use of this medicine in children. Special care may be needed. Overdosage: If you think you have taken too much of this medicine contact a poison control center or emergency room at once. NOTE: This medicine is only for you. Do not share this medicine with others. What if I miss a dose? It is important not to miss your dose. Call your doctor or health care professional if you are unable to keep an appointment. What may interact with this medicine? -medicines for diabetes -medicines to increase blood counts like filgrastim, pegfilgrastim, sargramostim -zalcitabine Talk to your doctor or health care professional before taking any of these medicines: -acetaminophen -aspirin -ibuprofen -ketoprofen -naproxen This list may not describe all possible interactions. Give your health care provider a list of all the medicines, herbs, non-prescription drugs, or dietary supplements you use. Also tell them if you smoke, drink alcohol, or use illegal drugs. Some items may interact with your medicine. What should I watch for while using this medicine? Visit your doctor for checks on your progress. This drug may make you feel generally unwell. This is not uncommon, as chemotherapy can affect healthy cells as well as cancer cells. Report any side effects. Continue your course of treatment even though you feel ill unless your doctor tells you to stop. You may get drowsy or dizzy. Do not drive, use machinery, or do anything that needs mental alertness until you know how this medicine affects you. Do not stand or sit up quickly, especially if you are an older patient. This reduces the risk of dizzy or fainting spells. In some cases, you may be given additional medicines to help with side effects. Follow all directions for their use. Call your doctor or health care professional for advice if you get a fever, chills or sore throat, or other symptoms of a cold or flu. Do not treat  yourself. This drug decreases your body's ability to fight infections. Try to avoid being around people who are sick. This medicine may increase your risk to bruise or bleed. Call your doctor or health care professional if you notice any unusual bleeding. Be careful brushing and flossing your teeth or using a toothpick because you may get an infection or bleed more easily. If you have any dental work done, tell your dentist you are receiving this medicine. Avoid taking products that contain aspirin, acetaminophen, ibuprofen, naproxen, or ketoprofen unless instructed by your doctor. These medicines may hide a fever. Do not become pregnant while taking this medicine. Women should inform their doctor if they wish to become pregnant or think they might be pregnant. There is a potential for serious side effects to an unborn child. Talk  to your health care professional or pharmacist for more information. Do not breast-feed an infant while taking this medicine. You may have vomiting or diarrhea while taking this medicine. Drink water or other fluids as directed. What side effects may I notice from receiving this medicine? Side effects that you should report to your doctor or health care professional as soon as possible: -allergic reactions like skin rash, itching or hives, swelling of the face, lips, or tongue -breathing problems -changes in hearing -changes in vision -fast, irregular heartbeat -feeling faint or lightheaded, falls -pain, tingling, numbness in the hands or feet -seizures -swelling of the ankles, feet, hands -unusual bleeding or bruising -unusually weak or tired -vomiting Side effects that usually do not require medical attention (report to your doctor or health care professional if they continue or are bothersome): -changes in emotions or moods -constipation -diarrhea -loss of appetite -headache -irritation at site where injected -nausea This list may not describe all possible  side effects. Call your doctor for medical advice about side effects. You may report side effects to FDA at 1-800-FDA-1088. Where should I keep my medicine? This drug is given in a hospital or clinic and will not be stored at home. NOTE: This sheet is a summary. It may not cover all possible information. If you have questions about this medicine, talk to your doctor, pharmacist, or health care provider.  2012, [employer]. (02/20/2010 11:42:36 AM)Dexamethasone tablets What is this medicine? DEXAMETHASONE (dex a METH a sone) is a corticosteroid. It is commonly used to treat inflammation of the skin, joints, lungs, and other organs. Common conditions treated include asthma, allergies, and arthritis. It is also used for other conditions, such as blood disorders and diseases of the adrenal glands. This medicine may be used for other purposes; ask your health care provider or pharmacist if you have questions. What should I tell my health care provider before I take this medicine? They need to know if you have any of these conditions: -Cushing's syndrome -diabetes -glaucoma -heart problems or disease -high blood pressure -infection like herpes, measles, tuberculosis, or chickenpox -kidney disease -liver disease -mental problems -myasthenia gravis -osteoporosis -previous heart attack -seizures -stomach, ulcer or intestine disease including colitis and diverticulitis -thyroid problem -an unusual or allergic reaction to dexamethasone, corticosteroids, other medicines, lactose, foods, dyes, or preservatives -pregnant or trying to get pregnant -breast-feeding How should I use this medicine? Take this medicine by mouth with a drink of water. Follow the directions on the prescription label. Take it with food or milk to avoid stomach upset. If you are taking this medicine once a day, take it in the morning. Do not take more medicine than you are told to take. Do not suddenly stop taking  your medicine because you may develop a severe reaction. Your doctor will tell you how much medicine to take. If your doctor wants you to stop the medicine, the dose may be slowly lowered over time to avoid any side effects. Talk to your pediatrician regarding the use of this medicine in children. Special care may be needed. Patients over 51 years old may have a stronger reaction and need a smaller dose. Overdosage: If you think you have taken too much of this medicine contact a poison control center or emergency room at once. NOTE: This medicine is only for you. Do not share this medicine with others. What if I miss a dose? If you miss a dose, take it as soon as you can. If it is almost  time for your next dose, talk to your doctor or health care professional. Bonita Quin may need to miss a dose or take an extra dose. Do not take double or extra doses without advice. What may interact with this medicine? Do not take this medicine with any of the following medications: -mifepristone, RU-486 -vaccines This medicine may also interact with the following medications: -amphotericin B -antibiotics like clarithromycin, erythromycin, and troleandomycin -aspirin and aspirin-like drugs -barbiturates like phenobarbital -carbamazepine -cholestyramine -cholinesterase inhibitors like donepezil, galantamine, rivastigmine, and tacrine -cyclosporine -digoxin -diuretics -ephedrine -female hormones, like estrogens or progestins and birth control pills -indinavir -isoniazid -ketoconazole -medicines for diabetes -medicines that improve muscle tone or strength for conditions like myasthenia gravis -NSAIDs, medicines for pain and inflammation, like ibuprofen or naproxen -phenytoin -rifampin -thalidomide -warfarin This list may not describe all possible interactions. Give your health care provider a list of all the medicines, herbs, non-prescription drugs, or dietary supplements you use. Also tell them if you  smoke, drink alcohol, or use illegal drugs. Some items may interact with your medicine. What should I watch for while using this medicine? Visit your doctor or health care professional for regular checks on your progress. If you are taking this medicine over a prolonged period, carry an identification card with your name and address, the type and dose of your medicine, and your doctor's name and address. This medicine may increase your risk of getting an infection. Stay away from people who are sick. Tell your doctor or health care professional if you are around anyone with measles or chickenpox. If you are going to have surgery, tell your doctor or health care professional that you have taken this medicine within the last twelve months. Ask your doctor or health care professional about your diet. You may need to lower the amount of salt you eat. The medicine can increase your blood sugar. If you are a diabetic check with your doctor if you need help adjusting the dose of your diabetic medicine. What side effects may I notice from receiving this medicine? Side effects that you should report to your doctor or health care professional as soon as possible: -allergic reactions like skin rash, itching or hives, swelling of the face, lips, or tongue -changes in vision -fever, sore throat, sneezing, cough, or other signs of infection, wounds that will not heal -increased thirst -mental depression, mood swings, mistaken feelings of self importance or of being mistreated -pain in hips, back, ribs, arms, shoulders, or legs -redness, blistering, peeling or loosening of the skin, including inside the mouth -trouble passing urine or change in the amount of urine -swelling of feet or lower legs -unusual bleeding or bruising Side effects that usually do not require medical attention (report to your doctor or health care professional if they continue or are bothersome): -headache -nausea, vomiting -skin  problems, acne, thin and shiny skin -weight gain This list may not describe all possible side effects. Call your doctor for medical advice about side effects. You may report side effects to FDA at 1-800-FDA-1088. Where should I keep my medicine? Keep out of the reach of children. Store at room temperature between 20 and 25 degrees C (68 and 77 degrees F). Protect from light. Throw away any unused medicine after the expiration date. NOTE: This sheet is a summary. It may not cover all possible information. If you have questions about this medicine, talk to your doctor, pharmacist, or health care provider.  2013, [employer]. (05/06/2007 2:02:13 PM) Acyclovir tablets or capsules  What is this medicine? ACYCLOVIR (ay SYE kloe veer) is an antiviral medicine. It is used to treat or prevent infections caused by certain kinds of viruses. Examples of these infections include herpes and shingles. This medicine will not cure herpes. This medicine may be used for other purposes; ask your health care provider or pharmacist if you have questions. What should I tell my health care provider before I take this medicine? They need to know if you have any of these conditions: -kidney disease -an unusual or allergic reaction to acyclovir, ganciclovir, valacyclovir, other medicines, foods, dyes, or preservatives -pregnant or trying to get pregnant -breast-feeding How should I use this medicine? Take this medicine by mouth with a glass of water. Follow the directions on the prescription label. You can take it with or without food. Take your medicine at regular intervals. Do not take your medicine more often than directed. Take all of your medicine as directed even if you think your are better. Do not skip doses or stop your medicine early. Talk to your pediatrician regarding the use of this medicine in children. While this drug may be prescribed for selected conditions, precautions do apply. Overdosage: If  you think you have taken too much of this medicine contact a poison control center or emergency room at once. NOTE: This medicine is only for you. Do not share this medicine with others. What if I miss a dose? If you miss a dose, take it as soon as you can. If it is almost time for your next dose, take only that dose. Do not take double or extra doses. What may interact with this medicine? -probenecid This list may not describe all possible interactions. Give your health care provider a list of all the medicines, herbs, non-prescription drugs, or dietary supplements you use. Also tell them if you smoke, drink alcohol, or use illegal drugs. Some items may interact with your medicine. What should I watch for while using this medicine? Tell your doctor or health care professional if your symptoms do not improve. This medicine works best when started very early in the course of an infection. Begin treatment at the first signs of infection. Drink 6 to 8 glasses of water or fluids every day while you are taking this medicine. This will help prevent side effects. You can still pass chickenpox, shingles, or herpes to another person even while you are taking this medicine. Avoid contact with others as directed. Genital herpes is a sexually transmitted disease. Talk to your doctor about how to stop the spread of infection. What side effects may I notice from receiving this medicine? Side effects that you should report to your doctor or health care professional as soon as possible: -allergic reactions like skin rash, itching or hives, swelling of the face, lips, or tongue -chest pain -confusion, hallucinations, tremor -dark urine -increased sensitivity to the sun -redness, blistering, peeling or loosening of the skin, including inside the mouth -seizures -trouble passing urine or change in the amount of urine -unusual bleeding or bruising, or pinpoint red spots on the skin -unusually weak or  tired -yellowing of the eyes or skin Side effects that usually do not require medical attention (report to your doctor or health care professional if they continue or are bothersome): -diarrhea -fever -headache -nausea, vomiting -stomach upset This list may not describe all possible side effects. Call your doctor for medical advice about side effects. You may report side effects to FDA at 1-800-FDA-1088. Where should I keep my  medicine? Keep out of the reach of children. Store at room temperature between 15 and 25 degrees C (59 and 77 degrees F). Throw away any unused medicine after the expiration date. NOTE: This sheet is a summary. It may not cover all possible information. If you have questions about this medicine, talk to your doctor, pharmacist, or health care provider.  2012, Elsevier/Gold Standard. (03/31/2007 1:15:46 PM)

## 2012-10-11 NOTE — Progress Notes (Signed)
ID: [name] OB: [birth year]  MR#: [id number]  [name]  Telephone:(336) [phone] Fax:(336) [phone]   OFFICE PROGRESS NOTE  PCP: [name], MD  DIAGNOSIS:  1. IgG kappa multiple myeloma. She presented in 05/2012 with anemia, Cr 0.8, Ca 9.6; no bone lytic lesion; LDH 140; beta2- microglobulin 1.86 (ref 1.01-1.73). Positive SPEP for M-spike at 1.69, IgG 2080, kappa 7.24, kappa:lambda ratio 24.97; UPEP positive for Bence Jones protein. Bone marrow biopsy on 06/08/12 showed 19% plasma cell; normal cytogenetics; myeloma FISH pending.  2. Nonsmall cell lung cancer, adenocarcinoma; staging pending PET scan result.   PAST THERAPY: biopsy only.   CURRENT THERAPY: To be determined.  INTERVAL HISTORY:  BURNADETTE [name] 67 y.o. female who returns for follow up visit. Her appetite is good but she lost 12 lb. She fells tired . She has chronic nasal congestion and discharge.. She has depression  The patient denied fever, chills, night sweats. She denied headaches, double vision, blurry vision, hearing problems, odynophagia or dysphagia. No chest pain, palpitations, dyspnea, cough, abdominal pain, nausea, vomiting, diarrhea, constipation, hematochezia. The patient denied dysuria, nocturia, polyuria, hematuria, myalgia, numbness, tingling.  Review of Systems  Constitutional: Positive for weight loss. Negative for fever, chills, malaise/fatigue and diaphoresis.  HENT: Positive for congestion. Negative for hearing loss, ear pain, nosebleeds, sore throat, neck pain, tinnitus and ear discharge.   Eyes: Negative for blurred vision, double vision, photophobia, pain, discharge and redness.  Respiratory: Negative for cough, hemoptysis, sputum production, shortness of breath, wheezing and stridor.   Cardiovascular: Negative for chest pain, palpitations, orthopnea, claudication, leg swelling and PND.  Gastrointestinal: Negative for heartburn, nausea, vomiting, abdominal pain,  diarrhea, constipation, blood in stool and melena.  Genitourinary: Negative for dysuria, urgency, frequency and hematuria.  Musculoskeletal: Negative for myalgias, back pain, joint pain and falls.  Skin: Negative for itching and rash.  Neurological: Negative for dizziness, tingling, tremors, sensory change, speech change, focal weakness, seizures, loss of consciousness, weakness and headaches.  Endo/Heme/Allergies: Does not bruise/bleed easily.  Psychiatric/Behavioral: Positive for depression.     PAST MEDICAL HISTORY: Past Medical History  Diagnosis Date  . Osteoporosis   . Hypercholesterolemia   . Esophageal reflux disease   . Hypertension   . Diabetes type 2, controlled   . Pancytopenia   . Ischemic colitis 2009  . Lung nodule 06/11/2012  . Depression   . Sleep apnea     UPPER AIRWAY RESISTANCE  DOES NOT HAVE CPAP , MAY NEED   . Tuberculosis      (+ TB TEST)YEARS AGO POSSIBLE EXPOSURE WAS MED TX   . Multiple myeloma 05/2012    Cytogenetics on 06/08/2012 was normal 57, XX [20]  . Liver enzyme elevation     PAST SURGICAL HISTORY: Past Surgical History  Procedure Laterality Date  . Colonoscopy  2013    poly; Dr. Oletta Lamas, next one due 2018.l  . Appendectomy    . Tonsillectomy    . Leg surgery    . Video bronchoscopy with endobronchial navigation Right 07/08/2012    Procedure: VIDEO BRONCHOSCOPY WITH ENDOBRONCHIAL NAVIGATION;  Surgeon: Collene Gobble, MD;  Location: [facility] OR;  Service: Thoracic;  Laterality: Right;    FAMILY HISTORY Family History  Problem Relation Age of Onset  . Heart attack Father   . Cancer Sister     synovial sarcoma  . Cancer Maternal Grandfather     ? cancer    HEALTH MAINTENANCE: History  Substance Use Topics  .  Smoking status: Former Smoker -- 1.00 packs/day for 30 years    Types: Cigarettes    Quit date: 01/27/2009  . Smokeless tobacco: Never Used  . Alcohol Use: 0.0 oz/week     Comment: 1 bottle per wine per night     No Known  Allergies  Current Outpatient Prescriptions  Medication Sig Dispense Refill  . buPROPion (WELLBUTRIN XL) 300 MG 24 hr tablet Take 300 mg by mouth daily.      Marland Kitchen esomeprazole (NEXIUM) 40 MG capsule Take 40 mg by mouth 2 (two) times daily.       Marland Kitchen losartan (COZAAR) 50 MG tablet Take 50 mg by mouth daily.      . metformin (FORTAMET) 500 MG (OSM) 24 hr tablet Take 1,500 mg by mouth. daily      . metoprolol succinate (TOPROL-XL) 50 MG 24 hr tablet Take 50 mg by mouth daily. Take with or immediately following a meal.      . Multiple Vitamins-Minerals (MULTIVITAMIN WITH MINERALS) tablet Take 1 tablet by mouth daily.      . sertraline (ZOLOFT) 100 MG tablet Take 100 mg by mouth daily.      . simvastatin (ZOCOR) 40 MG tablet Take 40 mg by mouth every evening.      Marland Kitchen acyclovir (ZOVIRAX) 400 MG tablet Take 1 tablet (400 mg total) by mouth 2 (two) times daily.  60 tablet  1  . dexamethasone (DECADRON) 4 MG tablet Take 10 tablets (40 mg total) by mouth once a week.  40 tablet  1  . enoxaparin (LOVENOX) 40 MG/0.4ML injection Inject 0.4 mLs (40 mg total) into the skin daily.  30 Syringe  3  . lenalidomide (REVLIMID) 25 MG capsule Take 1 capsule (25 mg total) by mouth daily. 14 days on and 7 days off.  14 capsule  0   No current facility-administered medications for this visit.    OBJECTIVE: Filed Vitals:   10/01/12 0959  BP: 169/78  Pulse: 78  Temp: 97.8 F (36.6 C)  Resp: 20     Body mass index is 33.1 kg/(m^2).    ECOG FS: 0  PHYSICAL EXAMINATION:  HEENT: Sclerae anicteric.  Conjunctivae were pink. Pupils round and reactive bilaterally. Oral mucosa is moist without ulceration or thrush. No occipital, submandibular, cervical, supraclavicular or axillar adenopathy. Lungs: clear to auscultation without wheezes. No rales or rhonchi. Heart: regular rate and rhythm. No murmur, gallop or rubs. Abdomen: soft, non tender. No guarding or rebound tenderness. Bowel sounds are present. No palpable  hepatosplenomegaly. MSK: no focal spinal tenderness. Extremities: No clubbing or cyanosis.No calf tenderness to palpitation, no peripheral edema. The patient had grossly intact strength in upper and lower extremities. Skin exam was without ecchymosis, petechiae. Neuro: non-focal, alert and oriented to time, person and place, appropriate affect  LAB RESULTS:  CMP     Component Value Date/Time   NA 139 07/07/2012 1353   NA 137 06/08/2012 0913   K 4.2 07/07/2012 1353   K 4.6 06/08/2012 0913   CL 103 07/07/2012 1353   CL 105 06/08/2012 0913   CO2 27 07/07/2012 1353   CO2 22 06/08/2012 0913   GLUCOSE 178* 07/07/2012 1353   GLUCOSE 120* 06/08/2012 0913   BUN 17 07/07/2012 1353   BUN 16.5 06/08/2012 0913   CREATININE 0.65 07/07/2012 1353   CREATININE 0.8 06/08/2012 0913   CALCIUM 10.1 07/07/2012 1353   CALCIUM 9.6 06/08/2012 0913   PROT 8.1 07/07/2012 1353   PROT 8.0 06/08/2012  0913   ALBUMIN 3.4* 07/07/2012 1353   ALBUMIN 3.4* 06/08/2012 0913   AST 38* 07/07/2012 1353   AST 34 06/08/2012 0913   ALT 43* 07/07/2012 1353   ALT 47 06/08/2012 0913   ALKPHOS 94 07/07/2012 1353   ALKPHOS 74 06/08/2012 0913   BILITOT 0.4 07/07/2012 1353   BILITOT 0.29 06/08/2012 0913   GFRNONAA >90 07/07/2012 1353   GFRAA >90 07/07/2012 1353    Lab Results  Component Value Date   WBC 3.5* 07/15/2012   NEUTROABS 2.0 07/15/2012   HGB 11.1* 07/15/2012   HCT 32.7* 07/15/2012   MCV 95.8 07/15/2012   PLT 203 07/15/2012      Chemistry      Component Value Date/Time   NA 139 07/07/2012 1353   NA 137 06/08/2012 0913   K 4.2 07/07/2012 1353   K 4.6 06/08/2012 0913   CL 103 07/07/2012 1353   CL 105 06/08/2012 0913   CO2 27 07/07/2012 1353   CO2 22 06/08/2012 0913   BUN 17 07/07/2012 1353   BUN 16.5 06/08/2012 0913   CREATININE 0.65 07/07/2012 1353   CREATININE 0.8 06/08/2012 0913      Component Value Date/Time   CALCIUM 10.1 07/07/2012 1353   CALCIUM 9.6 06/08/2012 0913   ALKPHOS 94 07/07/2012 1353   ALKPHOS 74 06/08/2012 0913   AST 38*  07/07/2012 1353   AST 34 06/08/2012 0913   ALT 43* 07/07/2012 1353   ALT 47 06/08/2012 0913   BILITOT 0.4 07/07/2012 1353   BILITOT 0.29 06/08/2012 0913       STUDIES: No results found.  ASSESSMENT AND PLAN: 1: Adenocarcinoma nonsmall cell lung cancer.  S/p robotic lobectomy on 08/12/2012. 2.  IgG kappa Multiple Myeloma:  The patient was seen by Dr.Zachariah Varney Daily who felt that patient a good candidate for autologous stem cell transplant  He recommended to proceed with VRd treatment with Velcade 1.3 mg Subq on days 1,8, 15; Revlimid 25 mg on days 1-14 on 15-21 off, Dexamethsone 40 mg on days 1,8,15. Cycle every 21 days  Evaluation by BMT team after 4 cycles. She will be started on Acyclovir 400 mg po bid and Lovenox 40 mg Subq. We will arrange chemotherapy teaching. VRd treatment will be started in 10 days. Will see patient at day of treatment. CBC weekly.   Conni Slipper, MD   10/01/2012 11:47 PM

## 2012-10-11 NOTE — Telephone Encounter (Signed)
Gave pt appt for lab, MD  and chemo for September and October 2014 °

## 2012-10-12 ENCOUNTER — Telehealth: Payer: Self-pay

## 2012-10-12 ENCOUNTER — Encounter: Payer: Self-pay | Admitting: Hematology and Oncology

## 2012-10-12 NOTE — Telephone Encounter (Signed)
Pt picked up lovenox and payment was >500 for a month. She is asking if there is any assistance. Message forwarded to [facility]. Pt states she has a nurse friend who will be helping her learn lovenox injection.

## 2012-10-12 NOTE — Telephone Encounter (Signed)
S/w pt to help clarify appt schedule, velcade, decadron and revlimid schedule. Pt thanked me for clarification and asked for a calendar with info on it. Pt will pick up calendar on 9/22 when comes for chemo.

## 2012-10-12 NOTE — Progress Notes (Signed)
[facility]  Telephone:(336) [phone] Fax:(336) [phone]   OFFICE PROGRESS NOTE   Cc:  [name], MD  DIAGNOSIS:  1.  IgG kappa multiple myeloma. She presented in 05/2012 with anemia, Cr 0.8, Ca 9.6; no bone lytic lesion; LDH 140; beta2- microglobulin 1.86 (ref 1.01-1.73). Positive SPEP for M-spike at 1.69, IgG 2080, kappa 7.24, kappa:lambda ratio 24.97; UPEP positive for Bence Jones protein. Bone marrow biopsy on 06/08/12 showed 19% plasma cell; normal cytogenetics; myeloma FISH pending.   2.  Nonsmall cell lung cancer, adenocarcinoma; staging pending PET scan result.   PAST THERAPY: biopsy only.   CURRENT THERAPY:  VRd q 21 days (Velcade  1.3 mg/m^2  on Days 1,8,15; Lenalidomide 25 mg daily, on days 1 through 14; and dexamethasone 40 mg on days 1,8, and 15).  Reference Para March, et. [facility] Hematol 2011; 86:57.  Start date is 10/18/2012 with planned 4 cycles.    INTERVAL HISTORY: [name] 67 y.o. female returns for her work-up.  She was last seen on 10/01/2012 and provided her dexamethasone, acyclovir and revlimid prescriptions.  She recently had a right lower lobe lobectomy secondary to NSCLC and mediastinal lymph node dissection at [facility].  She is returning her to start treatment for her multiple myeloma with 4 cycles of VRd planned. She has continues to have mild fatigue.  However, she is independent of all activities of daily living.  Patient denies fever, anorexia, weight loss, headache, visual changes, confusion, drenching night sweats, palpable lymph node swelling, mucositis, odynophagia, dysphagia, nausea vomiting, jaundice, chest pain, palpitation, shortness of breath, dyspnea on exertion, productive cough, gum bleeding, epistaxis, hematemesis, hemoptysis, abdominal pain, abdominal swelling, early satiety, melena, hematochezia, hematuria, skin rash, spontaneous bleeding, joint swelling, joint pain, heat or cold intolerance, bowel  bladder incontinence, back pain, focal motor weakness, paresthesia.    Past Medical History  Diagnosis Date  . Osteoporosis   . Hypercholesterolemia   . Esophageal reflux disease   . Hypertension   . Diabetes type 2, controlled   . Pancytopenia   . Ischemic colitis 2009  . Lung nodule 06/11/2012  . Depression   . Sleep apnea     UPPER AIRWAY RESISTANCE  DOES NOT HAVE CPAP , MAY NEED   . Tuberculosis      (+ TB TEST)YEARS AGO POSSIBLE EXPOSURE WAS MED TX   . Multiple myeloma 05/2012    Cytogenetics on 06/08/2012 was normal 46, XX [20]  . Liver enzyme elevation     Past Surgical History  Procedure Laterality Date  . Colonoscopy  2013    poly; Dr. Randa Evens, next one due 2018.l  . Appendectomy    . Tonsillectomy    . Leg surgery    . Video bronchoscopy with endobronchial navigation Right 07/08/2012    Procedure: VIDEO BRONCHOSCOPY WITH ENDOBRONCHIAL NAVIGATION;  Surgeon: Leslye Peer, MD;  Location: [facility] OR;  Service: Thoracic;  Laterality: Right;    Current Outpatient Prescriptions  Medication Sig Dispense Refill  . acyclovir (ZOVIRAX) 400 MG tablet Take 1 tablet (400 mg total) by mouth 2 (two) times daily.  60 tablet  1  . buPROPion (WELLBUTRIN XL) 300 MG 24 hr tablet Take 300 mg by mouth daily.      Marland Kitchen dexamethasone (DECADRON) 4 MG tablet Take 10 tablets (40 mg total) by mouth once a week.  40 tablet  1  . esomeprazole (NEXIUM) 40 MG capsule Take 40 mg by mouth 2 (two) times daily.       Marland Kitchen  lenalidomide (REVLIMID) 25 MG capsule Take 1 capsule (25 mg total) by mouth daily. 14 days on and 7 days off.  14 capsule  0  . losartan (COZAAR) 50 MG tablet Take 50 mg by mouth daily.      . metformin (FORTAMET) 500 MG (OSM) 24 hr tablet Take 1,500 mg by mouth. daily      . metoprolol succinate (TOPROL-XL) 50 MG 24 hr tablet Take 50 mg by mouth daily. Take with or immediately following a meal.      . Multiple Vitamins-Minerals (MULTIVITAMIN WITH MINERALS) tablet Take 1 tablet by mouth daily.       . sertraline (ZOLOFT) 100 MG tablet Take 100 mg by mouth daily.      . simvastatin (ZOCOR) 40 MG tablet Take 40 mg by mouth every evening.      . enoxaparin (LOVENOX) 40 MG/0.4ML injection Inject 0.4 mLs (40 mg total) into the skin daily.  30 Syringe  3   No current facility-administered medications for this visit.    ALLERGIES:  has No Known Allergies.  REVIEW OF SYSTEMS:  The rest of the 14-point review of system was negative.   Filed Vitals:   10/11/12 1230  BP: 144/72  Pulse: 80  Temp: 97.4 F (36.3 C)  Resp: 20   Wt Readings from Last 3 Encounters:  10/11/12 198 lb 14.4 oz (90.22 kg)  10/01/12 198 lb 14.4 oz (90.22 kg)  07/15/12 209 lb 12.8 oz (95.165 kg)   ECOG Performance status: 0-1  PHYSICAL EXAMINATION:   General:  well-nourished woman, in no acute distress.  Eyes:  no scleral icterus.  ENT:  There were no oropharyngeal lesions.  Neck was without thyromegaly.  Lymphatics:  Negative cervical, supraclavicular or axillary adenopathy.  Respiratory: lungs were clear bilaterally without wheezing or crackles.  Cardiovascular:  Regular rate and rhythm, S1/S2, without murmur, rub or gallop.  There was no pedal edema.  GI:  abdomen was soft, flat, nontender, nondistended, without organomegaly.  Muscoloskeletal:  no spinal tenderness of palpation of vertebral spine.  Skin exam was without echymosis, petichae.  Neuro exam was nonfocal.  Patient was able to get on and off exam table without assistance.  Gait was normal.  Patient was alert and oriented.  Attention was good.   Language was appropriate.  Mood was normal without depression.  Speech was not pressured.  Thought content was not tangential.      LABORATORY/RADIOLOGY DATA:  Lab Results  Component Value Date   WBC 3.5* 07/15/2012   HGB 11.1* 07/15/2012   HCT 32.7* 07/15/2012   PLT 203 07/15/2012   GLUCOSE 178* 07/07/2012   ALKPHOS 94 07/07/2012   ALT 43* 07/07/2012   AST 38* 07/07/2012   NA 139 07/07/2012   K 4.2  07/07/2012   CL 103 07/07/2012   CREATININE 0.65 07/07/2012   BUN 17 07/07/2012   CO2 27 07/07/2012   INR 0.95 07/07/2012   ASSESSMENT AND PLAN:   1.  Diagnosis #1:  Myeloma:   Finished treatment for her NSCLC, now we plan of VRd regiment for her disease as follows:       Cycle length: 21 days. Day #  Is 10/18/2012                                                  Velcade 1.3  mg/m^2 Amberg  Days 1, 8, and 15      Revlimide 25 mg PO  Daily, on days 1 through 14      Decadron  40 mg PO  Days 1, 8 and 15  -- We discussed the indications for her treatment which includes to control her multiple myeloma and to reduce effects on renal function, bony involvement and calcium.  The side effects include increased nausea, increased risk of clotting (we also provided a prescription of lovenox prophylaxis), infection risks particularly an increased risk of herpes zoster and infections not related to neutropenia (we also provide a prescription for acyclovir).  Other side effects included myelotoxicity, neuropathy secondary to bortezomib and other non-hematological toxicities.  She understood these risks versus the benefits and she choose to proceed with VRd on 10/18/2012.   --We will assess her CBC with differential, electrolytes, renal function, liver function and M protein level prior to starting each cycle.  CBC will be performed on day 8 ad 15 doses of velcade.  We will perform assessments for neuropathy and/or neuropathic pain.   2.  Diagnosis #2:  Adenocarcinoma nonsmall cell lung cancer.  S/p lobectomy as noted above.  We will follow-up per NCCN Guidelines.   3. Follow-up.   Patient will require a chemotherapy infusion appointment weekly for her Velcade starting 10/18/2012.   She will also need weekly CBC during this first cycle.  She should follow-up with me in one month or Day 1 of cycle #2.  She was provided handouts about the side-effects of each chemotherapy.   The length of time of the face-to-face  encounter was 15 minutes. More than 50% of time was spent counseling and coordination of care.    Jaclyn Prime. Malania Gawthrop, MD, MS  10/11/2012 9:30 pm

## 2012-10-13 NOTE — Progress Notes (Addendum)
FAXED RX TO [facility]. [phone] THEIR PHONE NUMBER IS [phone]. DEBBY KARL SENT RX TO [facility] I CALLED [facility] [phone] AND TEREE APPROVED IT FROM 10/11/12 TO 10/11/13 AUTH NUMBER IS  [id number]. CALLED DAVID PRICE @ [facility] AND GAVE HIM THE NUMBER THEIR PHONE IS [phone]. HER CO-PAY IS 900.00 AND THEY ARE WORKING ON ASSISTANCE FOR HER.

## 2012-10-14 ENCOUNTER — Telehealth: Payer: Self-pay | Admitting: *Deleted

## 2012-10-14 ENCOUNTER — Encounter: Payer: Self-pay | Admitting: *Deleted

## 2012-10-14 NOTE — Telephone Encounter (Signed)
VM from [facility] at [facility].  They processed Revlimid and pt's co pay will be $900.  She will contact pt to see if she needs to apply for co pay assistance.  Notified Lanora Manis in managed care dept.

## 2012-10-14 NOTE — Progress Notes (Signed)
RECEIVED A FAX FROM [facility] CONCERNING A CONFIRMATION OF FACSIMILE RECEIPT FOR PT. REFERRAL. 

## 2012-10-15 ENCOUNTER — Encounter: Payer: Self-pay | Admitting: Internal Medicine

## 2012-10-15 ENCOUNTER — Telehealth: Payer: Self-pay

## 2012-10-15 NOTE — Telephone Encounter (Signed)
Verified 14 days  of Revlimid with each 21 day cycle of Velcade. Pt. Aware that her chemotherapy appointment is for 1515 on 10-18-12 so she can be home to receive the shipment of Revlimid  From [facility].

## 2012-10-15 NOTE — Progress Notes (Addendum)
[name] CAME UP TO SEE ABOUT HER REVLIMID I CALLED [facility] AND DAVID SAID SHE WAS APPROVED FOR A 10,000.OO GRANT FROM [employer].  I CALLED [name] AND TOLD HER TO CALL [facility] [phone] AND THEY WILL SHIP IT WITH A 0.00 CO-PAY.  CALLED [name] BACK AND SHE SPOKE TO [facility] AND THEY WILL DELIVER THE REVLIMID ON Monday.

## 2012-10-18 ENCOUNTER — Encounter: Payer: Self-pay | Admitting: Medical Oncology

## 2012-10-18 ENCOUNTER — Encounter: Payer: Self-pay | Admitting: *Deleted

## 2012-10-18 ENCOUNTER — Other Ambulatory Visit (HOSPITAL_BASED_OUTPATIENT_CLINIC_OR_DEPARTMENT_OTHER): Payer: Medicare Other | Admitting: Lab

## 2012-10-18 ENCOUNTER — Ambulatory Visit (HOSPITAL_BASED_OUTPATIENT_CLINIC_OR_DEPARTMENT_OTHER): Payer: Medicare Other

## 2012-10-18 ENCOUNTER — Other Ambulatory Visit: Payer: Self-pay | Admitting: Medical Oncology

## 2012-10-18 ENCOUNTER — Other Ambulatory Visit: Payer: Self-pay | Admitting: Internal Medicine

## 2012-10-18 ENCOUNTER — Telehealth: Payer: Self-pay | Admitting: Medical Oncology

## 2012-10-18 VITALS — BP 138/88 | HR 75 | Temp 97.5°F | Resp 18

## 2012-10-18 DIAGNOSIS — C9000 Multiple myeloma not having achieved remission: Secondary | ICD-10-CM

## 2012-10-18 DIAGNOSIS — Z5112 Encounter for antineoplastic immunotherapy: Secondary | ICD-10-CM

## 2012-10-18 LAB — COMPREHENSIVE METABOLIC PANEL (CC13)
ALT: 23 U/L (ref 0–55)
Albumin: 3.2 g/dL — ABNORMAL LOW (ref 3.5–5.0)
CO2: 25 mEq/L (ref 22–29)
Chloride: 106 mEq/L (ref 98–109)
Potassium: 5.1 mEq/L (ref 3.5–5.1)
Sodium: 140 mEq/L (ref 136–145)
Total Bilirubin: 0.35 mg/dL (ref 0.20–1.20)
Total Protein: 8.1 g/dL (ref 6.4–8.3)

## 2012-10-18 LAB — CBC WITH DIFFERENTIAL/PLATELET
BASO%: 0.4 % (ref 0.0–2.0)
Eosinophils Absolute: 0.1 10*3/uL (ref 0.0–0.5)
LYMPH%: 18 % (ref 14.0–49.7)
MCHC: 32.4 g/dL (ref 31.5–36.0)
MONO#: 0.2 10*3/uL (ref 0.1–0.9)
NEUT#: 2.7 10*3/uL (ref 1.5–6.5)
RBC: 3.2 10*6/uL — ABNORMAL LOW (ref 3.70–5.45)
RDW: 17.3 % — ABNORMAL HIGH (ref 11.2–14.5)
WBC: 3.6 10*3/uL — ABNORMAL LOW (ref 3.9–10.3)
lymph#: 0.6 10*3/uL — ABNORMAL LOW (ref 0.9–3.3)

## 2012-10-18 MED ORDER — ONDANSETRON HCL 8 MG PO TABS
8.0000 mg | ORAL_TABLET | Freq: Once | ORAL | Status: AC
  Administered 2012-10-18: 8 mg via ORAL

## 2012-10-18 MED ORDER — BORTEZOMIB CHEMO SQ INJECTION 3.5 MG (2.5MG/ML)
1.3000 mg/m2 | Freq: Once | INTRAMUSCULAR | Status: AC
  Administered 2012-10-18: 2.75 mg via SUBCUTANEOUS
  Filled 2012-10-18: qty 2.75

## 2012-10-18 MED ORDER — ONDANSETRON HCL 8 MG PO TABS: 8.0000 mg | ORAL_TABLET | Freq: Three times a day (TID) | ORAL | Status: DC | PRN

## 2012-10-18 MED ORDER — ONDANSETRON HCL 8 MG PO TABS
ORAL_TABLET | ORAL | Status: AC
  Filled 2012-10-18: qty 1

## 2012-10-18 NOTE — Patient Instructions (Signed)
[facility] Discharge Instructions for Patients Receiving Chemotherapy  Today you received the following chemotherapy agents: Velcade.  To help prevent nausea and vomiting after your treatment, we encourage you to take your nausea medication as needed.   If you develop nausea and vomiting that is not controlled by your nausea medication, call the clinic.  Take your Decadron 40 mg weekly ( 10 tabs). Continue Acyclovir.  BELOW ARE SYMPTOMS THAT SHOULD BE REPORTED IMMEDIATELY:  *FEVER GREATER THAN 100.5 F  *CHILLS WITH OR WITHOUT FEVER  NAUSEA AND VOMITING THAT IS NOT CONTROLLED WITH YOUR NAUSEA MEDICATION  *UNUSUAL SHORTNESS OF BREATH  *UNUSUAL BRUISING OR BLEEDING  TENDERNESS IN MOUTH AND THROAT WITH OR WITHOUT PRESENCE OF ULCERS  *URINARY PROBLEMS  *BOWEL PROBLEMS  UNUSUAL RASH Items with * indicate a potential emergency and should be followed up as soon as possible.  Feel free to call the clinic should you have any questions or concerns. The clinic phone number is [phone].

## 2012-10-18 NOTE — Telephone Encounter (Signed)
Pt called voicing concerns about when to start her medications. She has received her revlimid and also picked up her zovirax. I instructed her to start her zovirax today and she can start her revlimid.She voiced these instructions back to me.  She states she is nervous and is not quite sure what to do. She will coming in today for her velcade. She is also doing lovenox and would like to the nurse to instruct her on how to give. I explained we can explain the technique as we give the velcade but she can not inject the lovenox she has at home. We will make sure all her questions are answered this afternoon.

## 2012-10-18 NOTE — Progress Notes (Signed)
RECEIVED A FAX FROM [facility] CONCERNING A CONFIRMATION OF PRESCRIPTION SHIPMENT FOR REVLIMID ON 10/15/12.

## 2012-10-19 ENCOUNTER — Encounter: Payer: Self-pay | Admitting: Internal Medicine

## 2012-10-19 ENCOUNTER — Telehealth: Payer: Self-pay | Admitting: *Deleted

## 2012-10-19 NOTE — Telephone Encounter (Signed)
Message copied by Augusto Garbe on Tue Oct 19, 2012 11:47 AM ------      Message from: Caleb Popp      Created: Mon Oct 18, 2012  3:32 PM      Regarding: chemo follow up call       Velcade/Decadron Revlimid 9/22 ([facility]) ------

## 2012-10-19 NOTE — Telephone Encounter (Signed)
Called [name] for chemotherapy F/U.  Patient is doing well.  Denies n/v.  Reports she was upt 44 times last night to empty her bladder and she her stomach hurts.  Instructed to take dexamethasone with food or at least saltine crackers and to take this early in the day.  Bowel and bladder is functioning well but stools are not soft.  Eating and drinking well and I instructed to drink 64 oz minimum daily or at least the day before, of and after treatment.  Asked what she is to do about refills on her steroids because she only received twenty pills and revlimid. Instructed to call the pharmacy and [facility] will automatically send the office the refill request.   Asked how to keep up with her routine for revlimid.  Instructed to write start dates on a calendar.  Denies further questions at this time and encouraged to call if needed.  Reviewed how to call after hours in the case of an emergency.

## 2012-10-19 NOTE — Progress Notes (Signed)
Per [employer] the patient was approved for Revlimid 10/13/12-10/13/13  $10,000.00. I will send to medical records and billing.

## 2012-10-20 ENCOUNTER — Telehealth: Payer: Self-pay | Admitting: Medical Oncology

## 2012-10-20 NOTE — Telephone Encounter (Signed)
Pt called and is concerned about the site where she received her velcade injection. She states it is about a 3"  Inflamed circle. I explained that this can occur and may last up to 6 days. She does lovenox injections daily and I told her to avoid this area. I stressed to call if the site gets worse or look like it getting infected. She voiced this back to me.

## 2012-10-24 ENCOUNTER — Other Ambulatory Visit: Payer: Self-pay | Admitting: Internal Medicine

## 2012-10-25 ENCOUNTER — Other Ambulatory Visit (HOSPITAL_BASED_OUTPATIENT_CLINIC_OR_DEPARTMENT_OTHER): Payer: Medicare Other | Admitting: Lab

## 2012-10-25 ENCOUNTER — Ambulatory Visit (HOSPITAL_BASED_OUTPATIENT_CLINIC_OR_DEPARTMENT_OTHER): Payer: Medicare Other

## 2012-10-25 VITALS — BP 147/62 | HR 74 | Temp 97.9°F | Resp 18

## 2012-10-25 DIAGNOSIS — C9000 Multiple myeloma not having achieved remission: Secondary | ICD-10-CM

## 2012-10-25 DIAGNOSIS — Z5112 Encounter for antineoplastic immunotherapy: Secondary | ICD-10-CM

## 2012-10-25 LAB — CBC WITH DIFFERENTIAL/PLATELET
BASO%: 0.3 % (ref 0.0–2.0)
LYMPH%: 11.9 % — ABNORMAL LOW (ref 14.0–49.7)
MCHC: 32.3 g/dL (ref 31.5–36.0)
MCV: 94.7 fL (ref 79.5–101.0)
MONO#: 0.1 10*3/uL (ref 0.1–0.9)
MONO%: 3.5 % (ref 0.0–14.0)
Platelets: 179 10*3/uL (ref 145–400)
RBC: 3.21 10*6/uL — ABNORMAL LOW (ref 3.70–5.45)
RDW: 17.5 % — ABNORMAL HIGH (ref 11.2–14.5)
WBC: 3.1 10*3/uL — ABNORMAL LOW (ref 3.9–10.3)

## 2012-10-25 MED ORDER — BORTEZOMIB CHEMO SQ INJECTION 3.5 MG (2.5MG/ML)
1.3000 mg/m2 | Freq: Once | INTRAMUSCULAR | Status: AC
  Administered 2012-10-25: 2.75 mg via SUBCUTANEOUS
  Filled 2012-10-25: qty 2.75

## 2012-10-25 MED ORDER — ONDANSETRON HCL 8 MG PO TABS
8.0000 mg | ORAL_TABLET | Freq: Once | ORAL | Status: AC
  Administered 2012-10-25: 8 mg via ORAL

## 2012-10-25 MED ORDER — ONDANSETRON HCL 8 MG PO TABS
ORAL_TABLET | ORAL | Status: AC
  Filled 2012-10-25: qty 1

## 2012-10-25 NOTE — Patient Instructions (Signed)
[facility] Discharge Instructions for Patients Receiving Chemotherapy  Today you received the following chemotherapy agents: Velcade.  To help prevent nausea and vomiting after your treatment, we encourage you to take your nausea medication as prescribed.   If you develop nausea and vomiting that is not controlled by your nausea medication, call the clinic.   BELOW ARE SYMPTOMS THAT SHOULD BE REPORTED IMMEDIATELY:  *FEVER GREATER THAN 100.5 F  *CHILLS WITH OR WITHOUT FEVER  NAUSEA AND VOMITING THAT IS NOT CONTROLLED WITH YOUR NAUSEA MEDICATION  *UNUSUAL SHORTNESS OF BREATH  *UNUSUAL BRUISING OR BLEEDING  TENDERNESS IN MOUTH AND THROAT WITH OR WITHOUT PRESENCE OF ULCERS  *URINARY PROBLEMS  *BOWEL PROBLEMS  UNUSUAL RASH Items with * indicate a potential emergency and should be followed up as soon as possible.  Feel free to call the clinic you have any questions or concerns. The clinic phone number is [phone].    

## 2012-10-28 ENCOUNTER — Telehealth: Payer: Self-pay | Admitting: Medical Oncology

## 2012-10-28 ENCOUNTER — Other Ambulatory Visit: Payer: Self-pay | Admitting: Internal Medicine

## 2012-10-28 ENCOUNTER — Encounter: Payer: Self-pay | Admitting: Medical Oncology

## 2012-10-28 NOTE — Telephone Encounter (Signed)
Pt called and states that she has an appt 11/01/12 for velcade. She thoughg she had a week off. I explained that she gets the velcade weekly but she gets a week off from the revlimid. She started her revlimid 10/18/12 and she should finish 10/31/12. She will rest for 7 days and then restart her revlimid. She voiced understanding and will be her 11/01/12 for her velcade. I encouraged her to call if any questions.

## 2012-11-01 ENCOUNTER — Telehealth: Payer: Self-pay

## 2012-11-01 ENCOUNTER — Telehealth: Payer: Self-pay | Admitting: Internal Medicine

## 2012-11-01 ENCOUNTER — Other Ambulatory Visit (HOSPITAL_BASED_OUTPATIENT_CLINIC_OR_DEPARTMENT_OTHER): Payer: Medicare Other | Admitting: Lab

## 2012-11-01 ENCOUNTER — Ambulatory Visit (HOSPITAL_BASED_OUTPATIENT_CLINIC_OR_DEPARTMENT_OTHER): Payer: Medicare Other

## 2012-11-01 ENCOUNTER — Encounter: Payer: Self-pay | Admitting: Specialist

## 2012-11-01 VITALS — BP 140/67 | HR 74 | Temp 97.3°F

## 2012-11-01 DIAGNOSIS — C9000 Multiple myeloma not having achieved remission: Secondary | ICD-10-CM

## 2012-11-01 DIAGNOSIS — Z5112 Encounter for antineoplastic immunotherapy: Secondary | ICD-10-CM

## 2012-11-01 LAB — CBC WITH DIFFERENTIAL/PLATELET
BASO%: 0.6 % (ref 0.0–2.0)
Basophils Absolute: 0 10*3/uL (ref 0.0–0.1)
EOS%: 3.9 % (ref 0.0–7.0)
HCT: 31.1 % — ABNORMAL LOW (ref 34.8–46.6)
HGB: 10 g/dL — ABNORMAL LOW (ref 11.6–15.9)
MCH: 30.5 pg (ref 25.1–34.0)
MCHC: 32.3 g/dL (ref 31.5–36.0)
MONO#: 0.3 10*3/uL (ref 0.1–0.9)
NEUT#: 3.1 10*3/uL (ref 1.5–6.5)
NEUT%: 75.7 % (ref 38.4–76.8)
RDW: 17.6 % — ABNORMAL HIGH (ref 11.2–14.5)
WBC: 4.1 10*3/uL (ref 3.9–10.3)
lymph#: 0.5 10*3/uL — ABNORMAL LOW (ref 0.9–3.3)

## 2012-11-01 MED ORDER — BORTEZOMIB CHEMO SQ INJECTION 3.5 MG (2.5MG/ML)
1.3000 mg/m2 | Freq: Once | INTRAMUSCULAR | Status: AC
  Administered 2012-11-01: 2.75 mg via SUBCUTANEOUS
  Filled 2012-11-01: qty 2.75

## 2012-11-01 MED ORDER — ONDANSETRON HCL 8 MG PO TABS
8.0000 mg | ORAL_TABLET | Freq: Once | ORAL | Status: AC
  Administered 2012-11-01: 8 mg via ORAL

## 2012-11-01 MED ORDER — ONDANSETRON HCL 8 MG PO TABS
ORAL_TABLET | ORAL | Status: AC
  Filled 2012-11-01: qty 1

## 2012-11-01 NOTE — Telephone Encounter (Signed)
Melodie Bouillon RN from infusion room called stating pt is very anxious. She is on lovenox SQ. She is asking if there are any other options. It costs her $345 per month. Will pass this on to Dr Rosie Fate for consideration of options.

## 2012-11-01 NOTE — Patient Instructions (Addendum)
[facility] Discharge Instructions for Patients Receiving Chemotherapy  Today you received the following chemotherapy agents:  Velcade  To help prevent nausea and vomiting after your treatment, we encourage you to take your nausea medication as ordered per MD.   If you develop nausea and vomiting that is not controlled by your nausea medication, call the clinic.   BELOW ARE SYMPTOMS THAT SHOULD BE REPORTED IMMEDIATELY:  *FEVER GREATER THAN 100.5 F  *CHILLS WITH OR WITHOUT FEVER  NAUSEA AND VOMITING THAT IS NOT CONTROLLED WITH YOUR NAUSEA MEDICATION  *UNUSUAL SHORTNESS OF BREATH  *UNUSUAL BRUISING OR BLEEDING  TENDERNESS IN MOUTH AND THROAT WITH OR WITHOUT PRESENCE OF ULCERS  *URINARY PROBLEMS  *BOWEL PROBLEMS  UNUSUAL RASH Items with * indicate a potential emergency and should be followed up as soon as possible.  Feel free to call the clinic you have any questions or concerns. The clinic phone number is [phone].    

## 2012-11-01 NOTE — Telephone Encounter (Signed)
Patient states that she cannot afford the lovenox.  She will see me on 10/13 and instructed her we will probably start aspirin prophylaxis.  She will not obtain any additional lovenox.

## 2012-11-01 NOTE — Progress Notes (Signed)
I talked with [name] in the infusion room today.  She recounted her experience since May of being diagnosed with multiple myeloma and also a lung nodule.  She says she has good days and not so good days.  She is divorced and said one of the hardest things about this experience has been telling her two adult sons.  I encouraged her to attend the Multiple Myeloma Support, which meets again on October 14.  She seemed very open to the suggestion.  I also gave her my contact information, should she need additional support.

## 2012-11-03 ENCOUNTER — Telehealth: Payer: Self-pay | Admitting: *Deleted

## 2012-11-03 DIAGNOSIS — C9000 Multiple myeloma not having achieved remission: Secondary | ICD-10-CM

## 2012-11-03 MED ORDER — LENALIDOMIDE 25 MG PO CAPS: 25.0000 mg | ORAL_CAPSULE | Freq: Every day | ORAL | Status: DC

## 2012-11-03 NOTE — Addendum Note (Signed)
Addended by: Wandalee Ferdinand on: 11/03/2012 04:52 PM   Modules accepted: Orders

## 2012-11-03 NOTE — Telephone Encounter (Signed)
Refill request for Revlimid to MD desk. 

## 2012-11-04 NOTE — Telephone Encounter (Signed)
RECEIVED A FAX FROM [facility] CONCERNING A CONFIRMATION OF FACSIMILE RECEIPT FOR PT. REFERRAL. 

## 2012-11-07 ENCOUNTER — Other Ambulatory Visit: Payer: Self-pay | Admitting: Internal Medicine

## 2012-11-08 ENCOUNTER — Other Ambulatory Visit (HOSPITAL_BASED_OUTPATIENT_CLINIC_OR_DEPARTMENT_OTHER): Payer: Medicare Other | Admitting: Lab

## 2012-11-08 ENCOUNTER — Ambulatory Visit (HOSPITAL_BASED_OUTPATIENT_CLINIC_OR_DEPARTMENT_OTHER): Payer: Medicare Other

## 2012-11-08 ENCOUNTER — Ambulatory Visit (HOSPITAL_BASED_OUTPATIENT_CLINIC_OR_DEPARTMENT_OTHER): Payer: Medicare Other | Admitting: Internal Medicine

## 2012-11-08 ENCOUNTER — Other Ambulatory Visit: Payer: Medicare Other | Admitting: Lab

## 2012-11-08 VITALS — BP 135/95 | HR 103 | Temp 98.7°F | Resp 20 | Ht 65.0 in | Wt 197.4 lb

## 2012-11-08 DIAGNOSIS — C3492 Malignant neoplasm of unspecified part of left bronchus or lung: Secondary | ICD-10-CM

## 2012-11-08 DIAGNOSIS — C9000 Multiple myeloma not having achieved remission: Secondary | ICD-10-CM

## 2012-11-08 DIAGNOSIS — R5381 Other malaise: Secondary | ICD-10-CM

## 2012-11-08 DIAGNOSIS — Z5112 Encounter for antineoplastic immunotherapy: Secondary | ICD-10-CM

## 2012-11-08 DIAGNOSIS — C3490 Malignant neoplasm of unspecified part of unspecified bronchus or lung: Secondary | ICD-10-CM | POA: Insufficient documentation

## 2012-11-08 DIAGNOSIS — R5383 Other fatigue: Secondary | ICD-10-CM

## 2012-11-08 DIAGNOSIS — E669 Obesity, unspecified: Secondary | ICD-10-CM

## 2012-11-08 LAB — CBC WITH DIFFERENTIAL/PLATELET
BASO%: 0.8 % (ref 0.0–2.0)
Basophils Absolute: 0 10*3/uL (ref 0.0–0.1)
EOS%: 1.3 % (ref 0.0–7.0)
Eosinophils Absolute: 0.1 10*3/uL (ref 0.0–0.5)
HCT: 31.2 % — ABNORMAL LOW (ref 34.8–46.6)
HGB: 10.1 g/dL — ABNORMAL LOW (ref 11.6–15.9)
LYMPH%: 10.2 % — ABNORMAL LOW (ref 14.0–49.7)
MCHC: 32.4 g/dL (ref 31.5–36.0)
MONO#: 0.2 10*3/uL (ref 0.1–0.9)
NEUT#: 3.3 10*3/uL (ref 1.5–6.5)
NEUT%: 83.1 % — ABNORMAL HIGH (ref 38.4–76.8)
Platelets: 223 10*3/uL (ref 145–400)
RDW: 17.6 % — ABNORMAL HIGH (ref 11.2–14.5)
WBC: 4 10*3/uL (ref 3.9–10.3)
lymph#: 0.4 10*3/uL — ABNORMAL LOW (ref 0.9–3.3)

## 2012-11-08 LAB — COMPREHENSIVE METABOLIC PANEL (CC13)
ALT: 17 U/L (ref 0–55)
AST: 12 U/L (ref 5–34)
Albumin: 3.2 g/dL — ABNORMAL LOW (ref 3.5–5.0)
Anion Gap: 10 mEq/L (ref 3–11)
Calcium: 9.9 mg/dL (ref 8.4–10.4)
Chloride: 107 mEq/L (ref 98–109)
Creatinine: 0.8 mg/dL (ref 0.6–1.1)
Glucose: 128 mg/dl (ref 70–140)
Potassium: 4.7 mEq/L (ref 3.5–5.1)
Sodium: 140 mEq/L (ref 136–145)

## 2012-11-08 MED ORDER — BORTEZOMIB CHEMO SQ INJECTION 3.5 MG (2.5MG/ML)
1.3000 mg/m2 | Freq: Once | INTRAMUSCULAR | Status: AC
  Administered 2012-11-08: 2.75 mg via SUBCUTANEOUS
  Filled 2012-11-08: qty 2.75

## 2012-11-08 MED ORDER — ONDANSETRON HCL 8 MG PO TABS
8.0000 mg | ORAL_TABLET | Freq: Once | ORAL | Status: AC
  Administered 2012-11-08: 8 mg via ORAL

## 2012-11-08 MED ORDER — ONDANSETRON HCL 8 MG PO TABS
ORAL_TABLET | ORAL | Status: AC
  Filled 2012-11-08: qty 1

## 2012-11-08 MED ORDER — WARFARIN SODIUM 2 MG PO TABS: 2.0000 mg | ORAL_TABLET | Freq: Every day | ORAL | Status: DC

## 2012-11-08 NOTE — Progress Notes (Signed)
[facility] OFFICE PROGRESS NOTE  Jessica Confer, MD [address] [place] [address]  DIAGNOSIS: 1. IgG kappa multiple myeloma. She presented in 05/2012 with anemia, Cr 0.8, Ca 9.6; no bone lytic lesion; LDH 140; beta2- microglobulin 1.86 (ref 1.01-1.73). Positive SPEP for M-spike at 1.69, IgG 2080, kappa 7.24, kappa:lambda ratio 24.97; UPEP positive for Bence Jones protein. Bone marrow biopsy on 06/08/12 showed 19% plasma cell; normal cytogenetics; myeloma FISH pending.  2. Nonsmall cell lung cancer, adenocarcinoma; staging pending PET scan result.  Multiple myeloma, without mention of having achieved remission(203.00) - Plan: warfarin (COUMADIN) 2 MG tablet, CBC with Differential, Comprehensive metabolic panel, Kappa/lambda light chains, IgG, IgA, IgM, Ambulatory referral to Anticoagulation Monitoring  Obesity  Non-small cell lung cancer, left  Chief Complaint  Patient presents with  . Multiple Myeloma   PAST THERAPY: biopsy only.   CURRENT THERAPY: VRd q 21 days (Velcade 1.3 mg/m^2 Burke on Days 1,8,15; Lenalidomide 25 mg daily, on days 1 through 14; and dexamethasone 40 mg on days 1,8, and 15). Reference Para March, et. [facility] Hematol 2011; 86:57. Start date is 10/18/2012 with planned 4 cycles.  She starts cycle #2, day #1 on 11/08/2012.  INTERVAL HISTORY:  [name] 67 y.o. [name] returns for her work-up. She was last seen on 10/11/2012 and started VRd on 10/18/2012.  She reports doing well overall.  She is compliant to her dexamethasone, acyclovir and revlimid prescriptions. She has continues to have mild fatigue. However, she is independent of all activities of daily living. Patient denies fever, anorexia, weight loss, headache, visual changes, confusion, drenching night sweats, palpable lymph node swelling, mucositis, odynophagia, dysphagia, nausea vomiting, jaundice, chest pain, palpitation, shortness of breath, dyspnea on exertion, productive cough,  gum bleeding, epistaxis, hematemesis, hemoptysis, abdominal pain, abdominal swelling, early satiety, melena, hematochezia, hematuria, skin rash, spontaneous bleeding, joint swelling, joint pain, heat or cold intolerance, bowel bladder incontinence, back pain, focal motor weakness, paresthesia.   MEDICAL HISTORY: Past Medical History  Diagnosis Date  . Osteoporosis   . Hypercholesterolemia   . Esophageal reflux disease   . Hypertension   . Diabetes type 2, controlled   . Pancytopenia   . Ischemic colitis 2009  . Lung nodule 06/11/2012  . Depression   . Sleep apnea     UPPER AIRWAY RESISTANCE  DOES NOT HAVE CPAP , MAY NEED   . Tuberculosis      (+ TB TEST)YEARS AGO POSSIBLE EXPOSURE WAS MED TX   . Multiple myeloma 05/2012    Cytogenetics on 06/08/2012 was normal 46, XX [20]  . Liver enzyme elevation     INTERIM HISTORY: has HYPERLIPIDEMIA; HYPERTENSION; PULMONARY NODULE, LEFT UPPER LOBE; G E R D; OSTEOPOROSIS; Nonspecific (abnormal) findings on radiological and other examination of body structure; ABNORMAL RADIOLOGIAL EXAMINATION; Monoclonal gammopathy; Lung nodule; Multiple myeloma, without mention of having achieved remission(203.00); Obesity; and Non-small cell lung cancer on her problem list.    ALLERGIES:  has No Known Allergies.  MEDICATIONS: has a current medication list which includes the following prescription(s): acyclovir, bupropion, dexamethasone, esomeprazole, lenalidomide, losartan, metformin, metoprolol succinate, multivitamin with minerals, ondansetron, sertraline, simvastatin, and warfarin.  SURGICAL HISTORY:  Past Surgical History  Procedure Laterality Date  . Colonoscopy  2013    poly; Dr. Randa Evens, next one due 2018.l  . Appendectomy    . Tonsillectomy    . Leg surgery    . Video bronchoscopy with endobronchial navigation Right 07/08/2012    Procedure: VIDEO BRONCHOSCOPY  WITH ENDOBRONCHIAL NAVIGATION;  Surgeon: Leslye Peer, MD;  Location: [facility] OR;  Service:  Thoracic;  Laterality: Right;    REVIEW OF SYSTEMS:   Constitutional: Denies fevers, chills or abnormal weight loss Eyes: Denies blurriness of vision Ears, nose, mouth, throat, and face: Denies mucositis or sore throat Respiratory: Denies cough, dyspnea or wheezes Cardiovascular: Denies palpitation, chest discomfort or lower extremity swelling Gastrointestinal:  Denies nausea, heartburn or change in bowel habits Skin: Denies abnormal skin rashes Lymphatics: Denies new lymphadenopathy or easy bruising Neurological:Denies numbness, tingling or new weaknesses Behavioral/Psych: Mood is stable, no new changes  All other systems were reviewed with the patient and are negative.  PHYSICAL EXAMINATION: ECOG PERFORMANCE STATUS: 0 - Asymptomatic  Blood pressure 135/95, pulse 103, temperature 98.7 F (37.1 C), temperature source Oral, resp. rate 20, height 5\' 5"  (1.651 m), weight 197 lb 6.4 oz (89.54 kg).  GENERAL:alert, no distress and comfortable; moderately obese.  SKIN: skin color, texture, turgor are normal, no rashes or significant lesions EYES: normal, Conjunctiva are pink and non-injected, sclera clear OROPHARYNX:no exudate, no erythema and lips, buccal mucosa, and tongue normal  NECK: supple, thyroid normal size, non-tender, without nodularity LYMPH:  no palpable lymphadenopathy in the cervical, axillary or supraclavicular LUNGS: clear to auscultation and percussion with normal breathing effort HEART: Tachycardic slightly with a regular rhythym and no murmurs and no lower extremity edema ABDOMEN:abdomen soft, non-tender and normal bowel sounds Musculoskeletal:no cyanosis of digits and no clubbing  NEURO: alert & oriented x 3 with fluent speech, no focal motor/sensory deficits  LABORATORY DATA: Results for orders placed in visit on 11/08/12 (from the past 48 hour(s))  CBC WITH DIFFERENTIAL     Status: Abnormal   Collection Time    11/08/12  2:32 PM      Result Value Range   WBC  4.0  3.9 - 10.3 10e3/uL   NEUT# 3.3  1.5 - 6.5 10e3/uL   HGB 10.1 (*) 11.6 - 15.9 g/dL   HCT 45.4 (*) 09.8 - 11.9 %   Platelets 223  145 - 400 10e3/uL   MCV 93.8  79.5 - 101.0 fL   MCH 30.4  25.1 - 34.0 pg   MCHC 32.4  31.5 - 36.0 g/dL   RBC 1.47 (*) 8.29 - 5.62 10e6/uL   RDW 17.6 (*) 11.2 - 14.5 %   lymph# 0.4 (*) 0.9 - 3.3 10e3/uL   MONO# 0.2  0.1 - 0.9 10e3/uL   Eosinophils Absolute 0.1  0.0 - 0.5 10e3/uL   Basophils Absolute 0.0  0.0 - 0.1 10e3/uL   NEUT% 83.1 (*) 38.4 - 76.8 %   LYMPH% 10.2 (*) 14.0 - 49.7 %   MONO% 4.6  0.0 - 14.0 %   EOS% 1.3  0.0 - 7.0 %   BASO% 0.8  0.0 - 2.0 %  COMPREHENSIVE METABOLIC PANEL (CC13)     Status: Abnormal   Collection Time    11/08/12  2:32 PM      Result Value Range   Sodium 140  136 - 145 mEq/L   Potassium 4.7  3.5 - 5.1 mEq/L   Chloride 107  98 - 109 mEq/L   CO2 24  22 - 29 mEq/L   Glucose 128  70 - 140 mg/dl   BUN 13.0  7.0 - 86.5 mg/dL   Creatinine 0.8  0.6 - 1.1 mg/dL   Total Bilirubin 7.84  0.20 - 1.20 mg/dL   Alkaline Phosphatase 114  40 - 150 U/L  AST 12  5 - 34 U/L   ALT 17  0 - 55 U/L   Total Protein 7.6  6.4 - 8.3 g/dL   Albumin 3.2 (*) 3.5 - 5.0 g/dL   Calcium 9.9  8.4 - 14.7 mg/dL   Anion Gap 10  3 - 11 mEq/L    Labs:  Lab Results  Component Value Date   WBC 4.0 11/08/2012   HGB 10.1* 11/08/2012   HCT 31.2* 11/08/2012   MCV 93.8 11/08/2012   PLT 223 11/08/2012   NEUTROABS 3.3 11/08/2012      Chemistry      Component Value Date/Time   NA 140 11/08/2012 1432   NA 139 07/07/2012 1353   K 4.7 11/08/2012 1432   K 4.2 07/07/2012 1353   CL 103 07/07/2012 1353   CL 105 06/08/2012 0913   CO2 24 11/08/2012 1432   CO2 27 07/07/2012 1353   BUN 13.8 11/08/2012 1432   BUN 17 07/07/2012 1353   CREATININE 0.8 11/08/2012 1432   CREATININE 0.65 07/07/2012 1353      Component Value Date/Time   CALCIUM 9.9 11/08/2012 1432   CALCIUM 10.1 07/07/2012 1353   ALKPHOS 114 11/08/2012 1432   ALKPHOS 94 07/07/2012 1353   AST 12  11/08/2012 1432   AST 38* 07/07/2012 1353   ALT 17 11/08/2012 1432   ALT 43* 07/07/2012 1353   BILITOT 0.42 11/08/2012 1432   BILITOT 0.4 07/07/2012 1353      Basic Metabolic Panel:  Recent Labs Lab 11/08/12 1432  NA 140  K 4.7  CO2 24  GLUCOSE 128  BUN 13.8  CREATININE 0.8  CALCIUM 9.9   GFR Estimated Creatinine Clearance: 76.4 ml/min (by C-G formula based on Cr of 0.8). Liver Function Tests:  Recent Labs Lab 11/08/12 1432  AST 12  ALT 17  ALKPHOS 114  BILITOT 0.42  PROT 7.6  ALBUMIN 3.2*   CBC:  Recent Labs Lab 11/08/12 1432  WBC 4.0  NEUTROABS 3.3  HGB 10.1*  HCT 31.2*  MCV 93.8  PLT 223   Results for LOVADA, BARWICK (MRN [id number]) as of 11/08/2012 17:27  Ref. Range 05/31/2012 11:39 06/08/2012 09:13  Albumin ELP Latest Range: 55.8-66.1 % 52.6 (L)   COMMENT (PROTEIN ELECTROPHOR) No range found *   Alpha-1-Globulin Latest Range: 2.9-4.9 % 3.8   Alpha-2-Globulin Latest Range: 7.1-11.8 % 8.5   Beta Globulin Latest Range: 4.7-7.2 % 6.4   Beta 2 Latest Range: 3.2-6.5 % 3.5   Gamma Globulin Latest Range: 11.1-18.8 % 25.2 (H)   M-SPIKE, % No range found 1.69   SPE Interp. No range found *   IgG (Immunoglobin G), Serum Latest Range: 4032315056 mg/dL  8657 (H)  IgA Latest Range: 69-380 mg/dL  846  IgM, Serum Latest Range: 52-322 mg/dL  14 (L)  Total Protein, Serum Electrophoresis Latest Range: 6.0-8.3 g/dL 8.1 7.8  Kappa free light chain Latest Range: 0.33-1.94 mg/dL 9.62 (H)   Lambda Free Lght Chn Latest Range: 0.57-2.63 mg/dL 9.52 (L)   Kappa:Lambda Ratio Latest Range: 0.26-1.65  24.97 (H)     RADIOGRAPHIC STUDIES: No results found.  ASSESSMENT: [name] 67 y.o. [name] with a history of Multiple myeloma, without mention of having achieved remission(203.00) - Plan: warfarin (COUMADIN) 2 MG tablet, CBC with Differential, Comprehensive metabolic panel, Kappa/lambda light chains, IgG, IgA, IgM, Ambulatory referral to Anticoagulation  Monitoring  Obesity  Non-small cell lung cancer, left   PLAN:   1. Diagnosis #1: IgG Kappa  Multiple Myeloma: Finished treatment for her NSCLC (as noted below), now we plan of VRd regiment for her disease as follows for 4 cyles:               Cycle # 2 (length: 21 days_ Day #1 is 11/08/2012     Velcade 1.3 mg/m^2 Ione Days 1, 8, and 15   Revlimide 25 mg PO Daily, on days 1 through 14   Decadron 40 mg PO Days 1, 8 and 15   -- Last visit, we discussed the indications for her treatment which includes to control her multiple myeloma and to reduce effects on renal function, bony involvement and calcium. The side effects include increased nausea, increased risk of clotting (we also provided a prescription of lovenox prophylaxis), infection risks particularly an increased risk of herpes zoster and infections not related to neutropenia (we also provide a prescription for acyclovir). Other side effects included myelotoxicity, neuropathy secondary to bortezomib and other non-hematological toxicities. She understood these risks versus the benefits and she choose to proceed with VRd on 10/18/2012.  She completed cycle #1 without incidence.  --We will assess her CBC with differential, electrolytes, renal function, liver function and M protein level prior to starting each cycle. CBC will be performed on day 8 and 15 doses of velcade. We will perform assessments for neuropathy and/or neuropathic pain.  --We will change her venous thromboembolic prophylaxis from lovenox to coumadin for a goal INR of 2-3 based on her high risk (Obesity, s/p lung cancer, multiple myeloma) based on A Palumbo et al, Leukemia (2008) [phone].   She was having to pay a significant amount out of pocket.   -- Evaluation by BMT team after 4 cycles, Dr.Zachariah Everardo Beals who felt that patient a good candidate for autologous stem cell transplant.  2. Diagnosis #2: Adenocarcinoma nonsmall cell lung cancer, stage I. S/p lobectomy as  noted above. We will follow-up per NCCN Guidelines.   3. Follow-up. Patient will require a chemotherapy infusion appointment weekly for her Velcade. She should follow-up with me in three weeks or Day 1 of cycle #3. She was provided handouts about the side-effects of each chemotherapy.   All questions were answered. The patient knows to call the clinic with any problems, questions or concerns. We can certainly see the patient much sooner if necessary.  I spent 15 minutes counseling the patient face to face. The total time spent in the appointment was 25 minutes.    Shilpa Bushee, MD 11/08/2012 5:29 PM

## 2012-11-08 NOTE — Patient Instructions (Signed)
Warfarin tablets What is this medicine? WARFARIN (WAR far in) is an anticoagulant. It is used to treat or prevent clots in the veins, arteries, lungs, or heart. This medicine may be used for other purposes; ask your health care provider or pharmacist if you have questions. What should I tell my health care provider before I take this medicine? They need to know if you have any of these conditions: -alcoholism -anemia -bleeding disorders -cancer -diabetes -heart disease -high blood pressure -history of bleeding in the gastrointestinal tract -history of stroke or other brain injury or disease -kidney or liver disease -protein C deficiency -protein S deficiency -psychosis or dementia -recent injury, recent or planned surgery or procedure -an unusual or allergic reaction to warfarin, other medicines, foods, dyes, or preservatives -pregnant or trying to get pregnant -breast-feeding How should I use this medicine? Take this medicine by mouth with a glass of water. Follow the directions on the prescription label. You can take this medicine with or without food. Take your medicine at the same time each day. Do not take it more often than directed. Do not stop taking except on the advice of your doctor or health care professional. A special MedGuide will be given to you by the pharmacist with each prescription and refill. Be sure to read this information carefully each time. Talk to your pediatrician regarding the use of this medicine in children. Special care may be needed. Overdosage: If you think you have taken too much of this medicine contact a poison control center or emergency room at once. NOTE: This medicine is only for you. Do not share this medicine with others. What if I miss a dose? It is important not to miss a dose. If you miss a dose, call your healthcare provider. Take the dose as soon as possible on the same day. If it is almost time for your next dose, take only that dose. Do  not take double or extra doses to make up for a missed dose. What may interact with this medicine? Do not take this medicine with any of the following medications: -agents that prevent or dissolve blood clots -aspirin or other salicylates -danshen -dextrothyroxine -mifepristone -St. John's Wort -red yeast rice This medicine may also interact with the following medications: -acetaminophen -agents that lower cholesterol -alcohol -allopurinol -amiodarone -antibiotics or medicines for treating bacterial, fungal or viral infections -azathioprine -barbiturate medicines for inducing sleep or treating seizures -certain medicines for diabetes -certain medicines for heart rhythm problems -certain medicines for high blood pressure -chloral hydrate -cisapride -disulfiram -female hormones, including contraceptive or birth control pills -general anesthetics -herbal or dietary products like cranberry, garlic, ginkgo, ginseng, green tea, or kava kava -influenza virus vaccine -female hormones -medicines for mental depression or psychosis -medicines for some types of cancer -medicines for stomach problems -methylphenidate -NSAIDs, medicines for pain and inflammation, like ibuprofen or naproxen -propoxyphene -quinidine, quinine -raloxifene -seizure or epilepsy medicine like carbamazepine, phenytoin, and valproic acid -steroids like cortisone and prednisone -tamoxifen -thyroid medicine -tramadol -vitamin c, vitamin e, and vitamin K -zafirlukast -zileuton This list may not describe all possible interactions. Give your health care provider a list of all the medicines, herbs, non-prescription drugs, or dietary supplements you use. Also tell them if you smoke, drink alcohol, or use illegal drugs. Some items may interact with your medicine. What should I watch for while using this medicine? Visit your doctor or health care professional for regular checks on your progress. You will need to have  your blood   checked regularly to make sure you are getting the right dose of this medicine. When you first start taking this medicine, these tests are done often. Once the correct dose is determined and you take your medicine properly, these tests can be done less often. While you are taking this medicine, carry an identification card with your name, the name and dose of medicine(s) being used, and the name and phone number of your doctor or health care professional or person to contact in an emergency. You should discuss your diet with your doctor or health care professional. Do not make major changes in your diet. Many foods contain high amounts of vitamin K, which can interfere with the effect of this medicine. Your doctor or health care professional may want you to limit your intake of foods that contain vitamin K. Some foods that have moderate to high amounts of vitamin K are green leafy vegetables like beet greens, collard greens, endive, kale, mustard greens, spinach, turnip greens, watercress, and certain lettuces like green leaf or romaine. Some other foods that have high to moderate amounts of vitamin K are asparagus, black eye peas, broccoli, brussel sprouts, cabbage, cucumber with peel, okra, peas, parsley, and green onions. This medicine can cause birth defects or bleeding in an unborn child. Women of childbearing age should use effective birth control while taking this medicine. If a woman becomes pregnant while taking this medicine, she should discuss the potential risks and her options with her health care professional. Avoid sports and activities that might cause injury while you are using this medicine. Severe falls or injuries can cause unseen bleeding. Be careful when using sharp tools or knives. Consider using an electric razor. Take special care brushing or flossing your teeth. Report any injuries, bruising, or red spots on the skin to your doctor or health care professional. If you have an  illness that causes vomiting, diarrhea, or fever for more than a few days, contact your doctor. Also check with your doctor if you are unable to eat for several days. These problems can change the effect of this medicine. Even after you stop taking this medicine, it takes several days before your body recovers its normal ability to clot blood. Ask your doctor or health care professional how long you need to be careful. If you are going to have surgery or dental work, tell your doctor or health care professional that you have been taking this medicine. What side effects may I notice from receiving this medicine? Side effects that you should report to your doctor or health care professional as soon as possible: -back or stomach pain -chest pain or fast or irregular heartbeat -difficulty breathing or talking, wheezing -dizziness -fever or chills -headaches -heavy menstrual bleeding or vaginal bleeding -nausea, vomiting -painful, blue, or purple toes -painful, prolonged erection -signs and symptoms of bleeding such as bloody or black, tarry stools, red or dark-brown urine, spitting up blood or brown material that looks like coffee grounds, red spots on the skin, unusual bruising or bleeding from the eye, gums, or nose -skin rash, itching or skin damage -unusual swelling or sudden weight gain -unusually weak or tired -yellowing of skin or eyes Side effects that usually do not require medical attention (report to your doctor or health care professional if they continue or are bothersome): -diarrhea -unusual hair loss This list may not describe all possible side effects. Call your doctor for medical advice about side effects. You may report side effects to FDA at 1-800-FDA-1088.   Where should I keep my medicine? Keep out of the reach of children. Store at room temperature between 15 and 30 degrees C (59 and 86 degrees F). Protect from light. Throw away any unused medicine after the expiration  date. NOTE: This sheet is a summary. It may not cover all possible information. If you have questions about this medicine, talk to your doctor, pharmacist, or health care provider.  2012, [employer]. (05/15/2010 10:58:04 AM) 

## 2012-11-09 ENCOUNTER — Telehealth: Payer: Self-pay | Admitting: Internal Medicine

## 2012-11-09 NOTE — Telephone Encounter (Signed)
Let patient know her kappa:lambda ratio is improving.  Also let her know that her goal InR will be between 2-3.

## 2012-11-10 ENCOUNTER — Ambulatory Visit: Payer: Medicare Other

## 2012-11-10 LAB — SPEP & IFE WITH QIG
Albumin ELP: 52.3 % — ABNORMAL LOW (ref 55.8–66.1)
Alpha-1-Globulin: 5.7 % — ABNORMAL HIGH (ref 2.9–4.9)
Beta 2: 4.6 % (ref 3.2–6.5)
IgG (Immunoglobin G), Serum: 1190 mg/dL (ref 690–1700)
IgM, Serum: 14 mg/dL — ABNORMAL LOW (ref 52–322)
M-Spike, %: 0.74 g/dL

## 2012-11-10 LAB — KAPPA/LAMBDA LIGHT CHAINS: Kappa:Lambda Ratio: 2.7 — ABNORMAL HIGH (ref 0.26–1.65)

## 2012-11-12 ENCOUNTER — Other Ambulatory Visit: Payer: Self-pay | Admitting: Internal Medicine

## 2012-11-14 ENCOUNTER — Other Ambulatory Visit: Payer: Self-pay | Admitting: Internal Medicine

## 2012-11-15 ENCOUNTER — Other Ambulatory Visit (HOSPITAL_BASED_OUTPATIENT_CLINIC_OR_DEPARTMENT_OTHER): Payer: Medicare Other | Admitting: Lab

## 2012-11-15 ENCOUNTER — Ambulatory Visit (HOSPITAL_BASED_OUTPATIENT_CLINIC_OR_DEPARTMENT_OTHER): Payer: Medicare Other

## 2012-11-15 VITALS — BP 124/61 | HR 71 | Temp 98.6°F | Resp 18

## 2012-11-15 DIAGNOSIS — C9000 Multiple myeloma not having achieved remission: Secondary | ICD-10-CM

## 2012-11-15 DIAGNOSIS — Z5112 Encounter for antineoplastic immunotherapy: Secondary | ICD-10-CM

## 2012-11-15 DIAGNOSIS — Z23 Encounter for immunization: Secondary | ICD-10-CM

## 2012-11-15 LAB — CBC WITH DIFFERENTIAL/PLATELET
Basophils Absolute: 0 10*3/uL (ref 0.0–0.1)
Eosinophils Absolute: 0.2 10*3/uL (ref 0.0–0.5)
HCT: 32.1 % — ABNORMAL LOW (ref 34.8–46.6)
HGB: 10.2 g/dL — ABNORMAL LOW (ref 11.6–15.9)
LYMPH%: 16.1 % (ref 14.0–49.7)
MCV: 93.7 fL (ref 79.5–101.0)
MONO#: 0.1 10*3/uL (ref 0.1–0.9)
MONO%: 3.4 % (ref 0.0–14.0)
NEUT#: 2 10*3/uL (ref 1.5–6.5)
NEUT%: 72.6 % (ref 38.4–76.8)
Platelets: 171 10*3/uL (ref 145–400)
RBC: 3.43 10*6/uL — ABNORMAL LOW (ref 3.70–5.45)
WBC: 2.7 10*3/uL — ABNORMAL LOW (ref 3.9–10.3)
lymph#: 0.4 10*3/uL — ABNORMAL LOW (ref 0.9–3.3)

## 2012-11-15 MED ORDER — BORTEZOMIB CHEMO SQ INJECTION 3.5 MG (2.5MG/ML)
1.3000 mg/m2 | Freq: Once | INTRAMUSCULAR | Status: AC
  Administered 2012-11-15: 2.75 mg via SUBCUTANEOUS
  Filled 2012-11-15: qty 2.75

## 2012-11-15 MED ORDER — ONDANSETRON HCL 8 MG PO TABS
8.0000 mg | ORAL_TABLET | Freq: Once | ORAL | Status: AC
  Administered 2012-11-15: 8 mg via ORAL

## 2012-11-15 MED ORDER — INFLUENZA VAC SPLIT QUAD 0.5 ML IM SUSP
0.5000 mL | Freq: Once | INTRAMUSCULAR | Status: AC
  Administered 2012-11-15: 0.5 mL via INTRAMUSCULAR
  Filled 2012-11-15: qty 0.5

## 2012-11-15 NOTE — Patient Instructions (Signed)
[facility] Discharge Instructions for Patients Receiving Chemotherapy  Today you received the following chemotherapy agent: Velcade   To help prevent nausea and vomiting after your treatment, we encourage you to take your nausea medication as prescribed.    If you develop nausea and vomiting that is not controlled by your nausea medication, call the clinic.   BELOW ARE SYMPTOMS THAT SHOULD BE REPORTED IMMEDIATELY:  *FEVER GREATER THAN 100.5 F  *CHILLS WITH OR WITHOUT FEVER  NAUSEA AND VOMITING THAT IS NOT CONTROLLED WITH YOUR NAUSEA MEDICATION  *UNUSUAL SHORTNESS OF BREATH  *UNUSUAL BRUISING OR BLEEDING  TENDERNESS IN MOUTH AND THROAT WITH OR WITHOUT PRESENCE OF ULCERS  *URINARY PROBLEMS  *BOWEL PROBLEMS  UNUSUAL RASH Items with * indicate a potential emergency and should be followed up as soon as possible.  Feel free to call the clinic you have any questions or concerns. The clinic phone number is [phone].    

## 2012-11-17 ENCOUNTER — Telehealth: Payer: Self-pay | Admitting: Medical Oncology

## 2012-11-17 NOTE — Telephone Encounter (Signed)
Pt called stating that she has a red rash around her mid abdomen, top of her legs and know around her elbows. I asked if she has taken any new medications or changed soaps or laundry detergent. She states no. She was here Monday and got her velcade and a flu vaccine. She had diarrhea that night but that went away. She noted the rash on her abdomen on Tuesday but today noted it has spread. She is not short of breath. She has never had a rash with the revlimid or the velcade. I discussed with Dr.Chism and he would like pt to take Benadryl 50 mg tonight and then 25 mg every 8 hours prn I epxlained it will make her sleepy .per Dr. Rosie Fate  I instructed her to go to the ER if she develops shortness of breath or rash develops in her mouth or genital area . She voiced understanding.

## 2012-11-19 ENCOUNTER — Telehealth: Payer: Self-pay

## 2012-11-19 NOTE — Telephone Encounter (Signed)
Pt called stating she still has her rash and it is spreading. She did NOT take benadryl as directed, she said she had 2 glasses of wine instead. She has taken no benadryl up to this point. She described her rash and it appears as the same description she gave 10/22 to Felicita Gage RN. I restated the instructions given to her on 10/22. She denied any SOB, or oral lesions.

## 2012-11-22 ENCOUNTER — Other Ambulatory Visit: Payer: Self-pay | Admitting: Internal Medicine

## 2012-11-22 ENCOUNTER — Other Ambulatory Visit (HOSPITAL_BASED_OUTPATIENT_CLINIC_OR_DEPARTMENT_OTHER): Payer: Medicare Other | Admitting: Lab

## 2012-11-22 ENCOUNTER — Ambulatory Visit (HOSPITAL_BASED_OUTPATIENT_CLINIC_OR_DEPARTMENT_OTHER): Payer: Medicare Other

## 2012-11-22 VITALS — BP 137/68 | HR 74 | Temp 97.8°F | Resp 18

## 2012-11-22 DIAGNOSIS — C9000 Multiple myeloma not having achieved remission: Secondary | ICD-10-CM

## 2012-11-22 DIAGNOSIS — Z5112 Encounter for antineoplastic immunotherapy: Secondary | ICD-10-CM

## 2012-11-22 LAB — COMPREHENSIVE METABOLIC PANEL (CC13)
ALT: 17 U/L (ref 0–55)
AST: 12 U/L (ref 5–34)
Alkaline Phosphatase: 84 U/L (ref 40–150)
Anion Gap: 10 mEq/L (ref 3–11)
CO2: 22 mEq/L (ref 22–29)
Creatinine: 0.7 mg/dL (ref 0.6–1.1)
Glucose: 211 mg/dl — ABNORMAL HIGH (ref 70–140)
Sodium: 141 mEq/L (ref 136–145)
Total Bilirubin: 0.34 mg/dL (ref 0.20–1.20)
Total Protein: 6.5 g/dL (ref 6.4–8.3)

## 2012-11-22 LAB — CBC WITH DIFFERENTIAL/PLATELET
BASO%: 0.8 % (ref 0.0–2.0)
EOS%: 13.6 % — ABNORMAL HIGH (ref 0.0–7.0)
Eosinophils Absolute: 0.4 10*3/uL (ref 0.0–0.5)
HCT: 30.8 % — ABNORMAL LOW (ref 34.8–46.6)
LYMPH%: 14.4 % (ref 14.0–49.7)
MCH: 29.4 pg (ref 25.1–34.0)
MCHC: 31.7 g/dL (ref 31.5–36.0)
MCV: 93 fL (ref 79.5–101.0)
MONO#: 0.2 10*3/uL (ref 0.1–0.9)
NEUT%: 63.7 % (ref 38.4–76.8)
Platelets: 139 10*3/uL — ABNORMAL LOW (ref 145–400)
RBC: 3.31 10*6/uL — ABNORMAL LOW (ref 3.70–5.45)
WBC: 3.1 10*3/uL — ABNORMAL LOW (ref 3.9–10.3)

## 2012-11-22 MED ORDER — BORTEZOMIB CHEMO SQ INJECTION 3.5 MG (2.5MG/ML)
1.3000 mg/m2 | Freq: Once | INTRAMUSCULAR | Status: AC
  Administered 2012-11-22: 2.75 mg via SUBCUTANEOUS
  Filled 2012-11-22: qty 2.75

## 2012-11-22 MED ORDER — ONDANSETRON HCL 8 MG PO TABS
ORAL_TABLET | ORAL | Status: AC
  Filled 2012-11-22: qty 1

## 2012-11-22 MED ORDER — HYDROXYZINE HCL 25 MG PO TABS: 25.0000 mg | ORAL_TABLET | Freq: Three times a day (TID) | ORAL | Status: DC | PRN

## 2012-11-22 MED ORDER — ONDANSETRON HCL 8 MG PO TABS
8.0000 mg | ORAL_TABLET | Freq: Once | ORAL | Status: AC
  Administered 2012-11-22: 8 mg via ORAL

## 2012-11-22 NOTE — Patient Instructions (Signed)
[facility] Discharge Instructions for Patients Receiving Chemotherapy  Today you received the following chemotherapy agents: velcade  To help prevent nausea and vomiting after your treatment, we encourage you to take your nausea medication.  Take it as often as prescribed.     If you develop nausea and vomiting that is not controlled by your nausea medication, call the clinic. If it is after clinic hours your family physician or the after hours number for the clinic or go to the Emergency Department.   BELOW ARE SYMPTOMS THAT SHOULD BE REPORTED IMMEDIATELY:  *FEVER GREATER THAN 100.5 F  *CHILLS WITH OR WITHOUT FEVER  NAUSEA AND VOMITING THAT IS NOT CONTROLLED WITH YOUR NAUSEA MEDICATION  *UNUSUAL SHORTNESS OF BREATH  *UNUSUAL BRUISING OR BLEEDING  TENDERNESS IN MOUTH AND THROAT WITH OR WITHOUT PRESENCE OF ULCERS  *URINARY PROBLEMS  *BOWEL PROBLEMS  UNUSUAL RASH Items with * indicate a potential emergency and should be followed up as soon as possible.  Feel free to call the clinic you have any questions or concerns. The clinic phone number is [phone].   I have been informed and understand all the instructions given to me. I know to contact the clinic, my physician, or go to the Emergency Department if any problems should occur. I do not have any questions at this time, but understand that I may call the clinic during office hours   should I have any questions or need assistance in obtaining follow up care.    __________________________________________  _____________  __________ Signature of Patient or Authorized Representative            Date                   Time    __________________________________________ Nurse's Signature    

## 2012-11-23 ENCOUNTER — Other Ambulatory Visit: Payer: Self-pay | Admitting: *Deleted

## 2012-11-23 LAB — IGG, IGA, IGM
IgA: 131 mg/dL (ref 69–380)
IgG (Immunoglobin G), Serum: 852 mg/dL (ref 690–1700)
IgM, Serum: 12 mg/dL — ABNORMAL LOW (ref 52–322)

## 2012-11-23 NOTE — Telephone Encounter (Signed)
THIS REFILL REQUEST FOR REVLIMID WAS GIVEN TO DR.CHISM'S NURSE, KATHY BUYCK,RN. 

## 2012-11-24 ENCOUNTER — Other Ambulatory Visit: Payer: Self-pay | Admitting: *Deleted

## 2012-11-24 DIAGNOSIS — C9000 Multiple myeloma not having achieved remission: Secondary | ICD-10-CM

## 2012-11-24 LAB — KAPPA/LAMBDA LIGHT CHAINS: Kappa:Lambda Ratio: 1.99 — ABNORMAL HIGH (ref 0.26–1.65)

## 2012-11-24 MED ORDER — LENALIDOMIDE 25 MG PO CAPS: 25.0000 mg | ORAL_CAPSULE | Freq: Every day | ORAL | Status: DC

## 2012-11-26 ENCOUNTER — Telehealth: Payer: Self-pay | Admitting: Internal Medicine

## 2012-11-26 ENCOUNTER — Other Ambulatory Visit: Payer: Self-pay

## 2012-11-26 DIAGNOSIS — C9000 Multiple myeloma not having achieved remission: Secondary | ICD-10-CM

## 2012-11-26 NOTE — Telephone Encounter (Signed)
RECEIVED A FAX FROM [facility] CONCERNING A CONFIRMATION OF PRESCRIPTION SHIPMENT FOR REVLIMID ON 11/25/12. 

## 2012-11-26 NOTE — Telephone Encounter (Signed)
Rash has almost resolved. Patient is happy and she did not take the atarax.

## 2012-11-29 ENCOUNTER — Other Ambulatory Visit: Payer: Self-pay | Admitting: Internal Medicine

## 2012-11-29 ENCOUNTER — Ambulatory Visit (HOSPITAL_BASED_OUTPATIENT_CLINIC_OR_DEPARTMENT_OTHER): Payer: Medicare Other | Admitting: Internal Medicine

## 2012-11-29 ENCOUNTER — Ambulatory Visit (HOSPITAL_BASED_OUTPATIENT_CLINIC_OR_DEPARTMENT_OTHER): Payer: Medicare Other

## 2012-11-29 ENCOUNTER — Encounter: Payer: Self-pay | Admitting: Pharmacist

## 2012-11-29 ENCOUNTER — Other Ambulatory Visit (HOSPITAL_BASED_OUTPATIENT_CLINIC_OR_DEPARTMENT_OTHER): Payer: Medicare Other

## 2012-11-29 VITALS — BP 152/87 | HR 76 | Temp 96.7°F | Resp 18 | Ht 65.0 in | Wt 201.7 lb

## 2012-11-29 DIAGNOSIS — C3430 Malignant neoplasm of lower lobe, unspecified bronchus or lung: Secondary | ICD-10-CM

## 2012-11-29 DIAGNOSIS — C9000 Multiple myeloma not having achieved remission: Secondary | ICD-10-CM

## 2012-11-29 DIAGNOSIS — C7A090 Malignant carcinoid tumor of the bronchus and lung: Secondary | ICD-10-CM | POA: Insufficient documentation

## 2012-11-29 DIAGNOSIS — Z5112 Encounter for antineoplastic immunotherapy: Secondary | ICD-10-CM

## 2012-11-29 DIAGNOSIS — C3492 Malignant neoplasm of unspecified part of left bronchus or lung: Secondary | ICD-10-CM

## 2012-11-29 LAB — CBC WITH DIFFERENTIAL/PLATELET
BASO%: 0.8 % (ref 0.0–2.0)
Basophils Absolute: 0 10*3/uL (ref 0.0–0.1)
Eosinophils Absolute: 0.3 10*3/uL (ref 0.0–0.5)
HCT: 29.7 % — ABNORMAL LOW (ref 34.8–46.6)
HGB: 9.4 g/dL — ABNORMAL LOW (ref 11.6–15.9)
LYMPH%: 22.4 % (ref 14.0–49.7)
MCH: 29.7 pg (ref 25.1–34.0)
MONO#: 0.4 10*3/uL (ref 0.1–0.9)
MONO%: 13.1 % (ref 0.0–14.0)
NEUT#: 1.8 10*3/uL (ref 1.5–6.5)
RDW: 17.7 % — ABNORMAL HIGH (ref 11.2–14.5)
WBC: 3.3 10*3/uL — ABNORMAL LOW (ref 3.9–10.3)
lymph#: 0.7 10*3/uL — ABNORMAL LOW (ref 0.9–3.3)

## 2012-11-29 MED ORDER — BORTEZOMIB CHEMO SQ INJECTION 3.5 MG (2.5MG/ML)
1.3000 mg/m2 | Freq: Once | INTRAMUSCULAR | Status: AC
  Administered 2012-11-29: 2.75 mg via SUBCUTANEOUS
  Filled 2012-11-29: qty 2.75

## 2012-11-29 MED ORDER — ONDANSETRON HCL 8 MG PO TABS
8.0000 mg | ORAL_TABLET | Freq: Once | ORAL | Status: AC
  Administered 2012-11-29: 8 mg via ORAL

## 2012-11-29 MED ORDER — ONDANSETRON HCL 8 MG PO TABS
ORAL_TABLET | ORAL | Status: AC
  Filled 2012-11-29: qty 1

## 2012-11-29 NOTE — Patient Instructions (Signed)
[facility] Discharge Instructions for Patients Receiving Chemotherapy  Today you received the following chemotherapy agent: Velcade   To help prevent nausea and vomiting after your treatment, we encourage you to take your nausea medication as prescribed.    If you develop nausea and vomiting that is not controlled by your nausea medication, call the clinic.   BELOW ARE SYMPTOMS THAT SHOULD BE REPORTED IMMEDIATELY:  *FEVER GREATER THAN 100.5 F  *CHILLS WITH OR WITHOUT FEVER  NAUSEA AND VOMITING THAT IS NOT CONTROLLED WITH YOUR NAUSEA MEDICATION  *UNUSUAL SHORTNESS OF BREATH  *UNUSUAL BRUISING OR BLEEDING  TENDERNESS IN MOUTH AND THROAT WITH OR WITHOUT PRESENCE OF ULCERS  *URINARY PROBLEMS  *BOWEL PROBLEMS  UNUSUAL RASH Items with * indicate a potential emergency and should be followed up as soon as possible.  Feel free to call the clinic you have any questions or concerns. The clinic phone number is [phone].    

## 2012-11-29 NOTE — Progress Notes (Signed)
Pt seen today by Dr. Rosie Fate and pt received velcade today in infusion area. 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 Rosie Fate (2mg  daily) This afternoon, Dr. Rosie Fate requested our coumadin clinic to take over the management of patient's coumadin at this time. I saw patient in the infusion area briefly after she received her velcade injection She is doing well with no complaints and no issues regarding bleeding or bruising Pt did not have an INR lab scheduled today and patient did not want to stay after her injection to have INR drawn. Pt's CBC is low but stable (Hgb = 9.4 and plts = 160).  Pt will be seen in infusion next week and have INR labs on Monday 12/06/12 lab at 11:45, infusion at 12:15 and coumadin clinic at 12:30 (pharmacist to see patient in infusion area) INR goal is 2-3. Pt to continue 2 mg dose until INR check next week (this dose may need to be increased at next visit based on INR).

## 2012-11-30 NOTE — Progress Notes (Signed)
[facility] OFFICE PROGRESS NOTE  Jessica Confer, MD [address] [place] [address]  DIAGNOSIS: Multiple myeloma, without mention of having achieved remission(203.00) - Plan: Protime-INR, CBC with Differential, Comprehensive metabolic panel, Protime-INR, Oncology referral to [facility], CANCELED: Protime-INR  Non-small cell lung cancer, left - Plan: Oncology referral to [facility], CANCELED: Protime-INR  Chief Complaint  Patient presents with  . Multiple myeloma, without mention of having achieved remissi   PAST THERAPY: biopsy only.   CURRENT THERAPY: VRd q 21 days (Velcade 1.3 mg/m^2 Combs on Days 1,8,15; Lenalidomide 25 mg daily, on days 1 through 14; and dexamethasone 40 mg on days 1,8, and 15). Reference Para March, et. [facility] Hematol 2011; 86:57. Start date is 10/18/2012 with planned 4 cycles.  She starts cycle #3, day #1 on 11/29/2012.  She started coumadin 2 mg about 6 days ago (she was on lovenox prior).   INTERVAL HISTORY:  [name] 67 y.o. female returns for her work-up. She was last seen on 11/08/2012 and started VRd on 10/18/2012. She reports doing well overall.  She was administered the flu shot and shortly afterwards developed a mild pruritus rash on her arms and abdomen that went away over of several days.  She reports complete resolution now.  She is compliant to her dexamethasone, acyclovir and revlimid prescriptions. She has continues to have mild fatigue. However, she is independent of all activities of daily living. Patient denies fever, anorexia, weight loss, headache, visual changes, confusion, drenching night sweats, palpable lymph node swelling, mucositis, odynophagia, dysphagia, nausea vomiting, jaundice, chest pain, palpitation, shortness of breath, dyspnea on exertion, productive cough, gum bleeding, epistaxis, hematemesis, hemoptysis, abdominal pain, abdominal swelling, early satiety,  melena, hematochezia, hematuria, skin rash, spontaneous bleeding, joint swelling, joint pain, heat or cold intolerance, bowel bladder incontinence, back pain, focal motor weakness, paresthesia.   MEDICAL HISTORY: Past Medical History  Diagnosis Date  . Osteoporosis   . Hypercholesterolemia   . Esophageal reflux disease   . Hypertension   . Diabetes type 2, controlled   . Pancytopenia   . Ischemic colitis 2009  . Lung nodule 06/11/2012  . Depression   . Sleep apnea     UPPER AIRWAY RESISTANCE  DOES NOT HAVE CPAP , MAY NEED   . Tuberculosis      (+ TB TEST)YEARS AGO POSSIBLE EXPOSURE WAS MED TX   . Multiple myeloma 05/2012    Cytogenetics on 06/08/2012 was normal 46, XX [20]  . Liver enzyme elevation     INTERIM HISTORY: has HYPERLIPIDEMIA; HYPERTENSION; PULMONARY NODULE, LEFT UPPER LOBE; G E R D; OSTEOPOROSIS; Nonspecific (abnormal) findings on radiological and other examination of body structure; ABNORMAL RADIOLOGIAL EXAMINATION; Monoclonal gammopathy; Lung nodule; Multiple myeloma, without mention of having achieved remission(203.00); Obesity; Non-small cell lung cancer; and Non-small cell lung cancer, left on her problem list.    ALLERGIES:  has No Known Allergies.  MEDICATIONS: has a current medication list which includes the following prescription(s): acyclovir, bupropion, dexamethasone, esomeprazole, hydroxyzine, lenalidomide, losartan, metformin, metoprolol succinate, multivitamin with minerals, ondansetron, sertraline, simvastatin, and warfarin.  SURGICAL HISTORY:  Past Surgical History  Procedure Laterality Date  . Colonoscopy  2013    poly; Dr. Randa Evens, next one due 2018.l  . Appendectomy    . Tonsillectomy    . Leg surgery    . Video bronchoscopy with endobronchial navigation Right 07/08/2012    Procedure: VIDEO BRONCHOSCOPY WITH ENDOBRONCHIAL NAVIGATION;  Surgeon: Leslye Peer,  MD;  Location: [facility] OR;  Service: Thoracic;  Laterality: Right;    REVIEW OF SYSTEMS:    Constitutional: Denies fevers, chills or abnormal weight loss Eyes: Denies blurriness of vision Ears, nose, mouth, throat, and face: Denies mucositis or sore throat Respiratory: Denies cough, dyspnea or wheezes Cardiovascular: Denies palpitation, chest discomfort or lower extremity swelling Gastrointestinal:  Denies nausea, heartburn or change in bowel habits Skin: Denies abnormal skin rashes Lymphatics: Denies new lymphadenopathy or easy bruising Neurological:Denies numbness, tingling or new weaknesses Behavioral/Psych: Mood is stable, no new changes  All other systems were reviewed with the patient and are negative.  PHYSICAL EXAMINATION: ECOG PERFORMANCE STATUS: 0 - Asymptomatic  Blood pressure 152/87, pulse 76, temperature 96.7 F (35.9 C), temperature source Oral, resp. rate 18, height 5\' 5"  (1.651 m), weight 201 lb 11.2 oz (91.491 kg), SpO2 97.00%.  GENERAL:alert, no distress and comfortable; moderately obese.  SKIN: skin color, texture, turgor are normal, no rashes or significant lesions EYES: normal, Conjunctiva are pink and non-injected, sclera clear OROPHARYNX:no exudate, no erythema and lips, buccal mucosa, and tongue normal  NECK: supple, thyroid normal size, non-tender, without nodularity LYMPH:  no palpable lymphadenopathy in the cervical, axillary or supraclavicular LUNGS: clear to auscultation and percussion with normal breathing effort HEART: regular rate & rhythm and no murmurs and no lower extremity edema ABDOMEN:abdomen soft, non-tender and normal bowel sounds Musculoskeletal:no cyanosis of digits and no clubbing  NEURO: alert & oriented x 3 with fluent speech, no focal motor/sensory deficits   LABORATORY DATA: Results for orders placed in visit on 11/29/12 (from the past 48 hour(s))  CBC WITH DIFFERENTIAL     Status: Abnormal   Collection Time    11/29/12 11:03 AM      Result Value Range   WBC 3.3 (*) 3.9 - 10.3 10e3/uL   NEUT# 1.8  1.5 - 6.5 10e3/uL    HGB 9.4 (*) 11.6 - 15.9 g/dL   HCT 81.1 (*) 91.4 - 78.2 %   Platelets 160  145 - 400 10e3/uL   MCV 93.8  79.5 - 101.0 fL   MCH 29.7  25.1 - 34.0 pg   MCHC 31.7  31.5 - 36.0 g/dL   RBC 9.56 (*) 2.13 - 0.86 10e6/uL   RDW 17.7 (*) 11.2 - 14.5 %   lymph# 0.7 (*) 0.9 - 3.3 10e3/uL   MONO# 0.4  0.1 - 0.9 10e3/uL   Eosinophils Absolute 0.3  0.0 - 0.5 10e3/uL   Basophils Absolute 0.0  0.0 - 0.1 10e3/uL   NEUT% 54.4  38.4 - 76.8 %   LYMPH% 22.4  14.0 - 49.7 %   MONO% 13.1  0.0 - 14.0 %   EOS% 9.3 (*) 0.0 - 7.0 %   BASO% 0.8  0.0 - 2.0 %     Labs:  Lab Results  Component Value Date   WBC 3.3* 11/29/2012   HGB 9.4* 11/29/2012   HCT 29.7* 11/29/2012   MCV 93.8 11/29/2012   PLT 160 11/29/2012   NEUTROABS 1.8 11/29/2012      Chemistry      Component Value Date/Time   NA 141 11/22/2012 1309   NA 139 07/07/2012 1353   K 3.8 11/22/2012 1309   K 4.2 07/07/2012 1353   CL 103 07/07/2012 1353   CL 105 06/08/2012 0913   CO2 22 11/22/2012 1309   CO2 27 07/07/2012 1353   BUN 8.0 11/22/2012 1309   BUN 17 07/07/2012 1353   CREATININE 0.7 11/22/2012 1309  CREATININE 0.65 07/07/2012 1353      Component Value Date/Time   CALCIUM 9.1 11/22/2012 1309   CALCIUM 10.1 07/07/2012 1353   ALKPHOS 84 11/22/2012 1309   ALKPHOS 94 07/07/2012 1353   AST 12 11/22/2012 1309   AST 38* 07/07/2012 1353   ALT 17 11/22/2012 1309   ALT 43* 07/07/2012 1353   BILITOT 0.34 11/22/2012 1309   BILITOT 0.4 07/07/2012 1353     CBC:  Recent Labs Lab 11/29/12 1103  WBC 3.3*  NEUTROABS 1.8  HGB 9.4*  HCT 29.7*  MCV 93.8  PLT 160   Results for YULENI, BURICH (MRN [id number]) as of 11/30/2012 00:21  Ref. Range 06/08/2012 09:13 11/08/2012 14:32 11/22/2012 13:10  IgG (Immunoglobin G), Serum Latest Range: [phone] mg/dL 7829 (H) 5621 308   Results for TANIYA, DASHER (MRN [id number]) as of 11/30/2012 00:21  Ref. Range 06/08/2012 09:13 11/08/2012 14:32 11/22/2012 13:10  IgG (Immunoglobin G), Serum Latest Range: [phone] mg/dL  9528 (H) 4132 440   RADIOGRAPHIC STUDIES: No results found.  ASSESSMENT: [name] 67 y.o. female with a history of Multiple myeloma, without mention of having achieved remission(203.00) - Plan: Protime-INR, CBC with Differential, Comprehensive metabolic panel, Protime-INR, Oncology referral to [facility], CANCELED: Protime-INR  Non-small cell lung cancer, left - Plan: Oncology referral to [facility], CANCELED: Protime-INR  PLAN:  1. Diagnosis #1: IgG Kappa Multiple Myeloma: Finished treatment for her NSCLC (as noted below), we began  VRd regiment for her disease as follows for 4 cyles:   Cycle # 3 (length: 21 days_ Day #1 is 11/29/2012  Velcade 1.3 mg/m^2  Days 1, 8, and 15  Revlimide 25 mg PO Daily, on days 1 through 14  Decadron 40 mg PO Days 1, 8 and 15   -- On prior visits, we discussed the indications for her treatment which includes to control her multiple myeloma and to reduce effects on renal function, bony involvement and calcium. The side effects include increased nausea, increased risk of clotting (we also provided a prescription of lovenox prophylaxis), infection risks particularly an increased risk of herpes zoster and infections not related to neutropenia (we also provide a prescription for acyclovir). Other side effects included myelotoxicity, neuropathy secondary to bortezomib and other non-hematological toxicities. She understood these risks versus the benefits and she choose to proceed with VRd on 10/18/2012. She completed cycle #1-2 without incidence.  --We will assess her CBC with differential, electrolytes, renal function, liver function and M protein level prior to starting each cycle. CBC will be performed on day 8 and 15 doses of velcade. We will perform assessments for neuropathy and/or neuropathic pain.  --We changed her venous thromboembolic prophylaxis from lovenox to coumadin for a goal INR of 2-3 based on her high risk  (Obesity, s/p lung cancer, multiple myeloma) based on A Palumbo et al, Leukemia (2008) [phone]. She was having to pay a significant amount out of pocket.  Referral made to coumadin clinic for a/c recommendations and follow-up.  Her goal INR will be 2. -- Evaluation by BMT team after 4 cycles, Dr.Zachariah Everardo Beals who felt that patient a good candidate for autologous stem cell transplant. She was instructed to call at the beginning of her 4th cycle.   2. Diagnosis #2: Adenocarcinoma nonsmall cell lung cancer, stage I. S/p lobectomy as noted above. We will follow-up per NCCN Guidelines.   3. Follow-up. Patient will require a chemotherapy infusion appointment weekly for her Velcade. She should  follow-up with me in three weeks or Day 1 of cycle #4. She was provided handouts about the side-effects of each chemotherapy on prior visits.   All questions were answered. The patient knows to call the clinic with any problems, questions or concerns. We can certainly see the patient much sooner if necessary.  I spent 15 minutes counseling the patient face to face. The total time spent in the appointment was 25 minutes.    Tristan Bramble, MD 11/30/2012 12:21 AM

## 2012-12-06 ENCOUNTER — Ambulatory Visit (HOSPITAL_BASED_OUTPATIENT_CLINIC_OR_DEPARTMENT_OTHER): Payer: Medicare Other | Admitting: Pharmacist

## 2012-12-06 ENCOUNTER — Ambulatory Visit (HOSPITAL_BASED_OUTPATIENT_CLINIC_OR_DEPARTMENT_OTHER): Payer: Medicare Other

## 2012-12-06 ENCOUNTER — Other Ambulatory Visit (HOSPITAL_BASED_OUTPATIENT_CLINIC_OR_DEPARTMENT_OTHER): Payer: Medicare Other | Admitting: Lab

## 2012-12-06 ENCOUNTER — Other Ambulatory Visit: Payer: Self-pay | Admitting: Internal Medicine

## 2012-12-06 VITALS — BP 124/76 | HR 78 | Temp 97.6°F | Resp 18 | Ht 65.0 in

## 2012-12-06 DIAGNOSIS — C3490 Malignant neoplasm of unspecified part of unspecified bronchus or lung: Secondary | ICD-10-CM

## 2012-12-06 DIAGNOSIS — C9000 Multiple myeloma not having achieved remission: Secondary | ICD-10-CM

## 2012-12-06 DIAGNOSIS — Z5112 Encounter for antineoplastic immunotherapy: Secondary | ICD-10-CM

## 2012-12-06 DIAGNOSIS — Z7901 Long term (current) use of anticoagulants: Secondary | ICD-10-CM

## 2012-12-06 LAB — CBC WITH DIFFERENTIAL/PLATELET
BASO%: 0.3 % (ref 0.0–2.0)
Basophils Absolute: 0 10*3/uL (ref 0.0–0.1)
EOS%: 29.9 % — ABNORMAL HIGH (ref 0.0–7.0)
Eosinophils Absolute: 1.1 10*3/uL — ABNORMAL HIGH (ref 0.0–0.5)
HCT: 32.8 % — ABNORMAL LOW (ref 34.8–46.6)
HGB: 10.3 g/dL — ABNORMAL LOW (ref 11.6–15.9)
MCH: 29.2 pg (ref 25.1–34.0)
MCHC: 31.3 g/dL — ABNORMAL LOW (ref 31.5–36.0)
MONO#: 0.1 10*3/uL (ref 0.1–0.9)
NEUT#: 1.9 10*3/uL (ref 1.5–6.5)
NEUT%: 52.2 % (ref 38.4–76.8)
Platelets: 180 10*3/uL (ref 145–400)
RDW: 17.7 % — ABNORMAL HIGH (ref 11.2–14.5)
WBC: 3.6 10*3/uL — ABNORMAL LOW (ref 3.9–10.3)
lymph#: 0.5 10*3/uL — ABNORMAL LOW (ref 0.9–3.3)

## 2012-12-06 LAB — PROTIME-INR
INR: 1.1 — ABNORMAL LOW (ref 2.00–3.50)
Protime: 13.2 Seconds (ref 10.6–13.4)

## 2012-12-06 MED ORDER — BORTEZOMIB CHEMO SQ INJECTION 3.5 MG (2.5MG/ML)
1.3000 mg/m2 | Freq: Once | INTRAMUSCULAR | Status: AC
  Administered 2012-12-06: 2.75 mg via SUBCUTANEOUS
  Filled 2012-12-06: qty 2.75

## 2012-12-06 MED ORDER — ONDANSETRON HCL 8 MG PO TABS
8.0000 mg | ORAL_TABLET | Freq: Once | ORAL | Status: AC
  Administered 2012-12-06: 8 mg via ORAL

## 2012-12-06 MED ORDER — ONDANSETRON HCL 8 MG PO TABS
ORAL_TABLET | ORAL | Status: AC
  Filled 2012-12-06: qty 1

## 2012-12-06 NOTE — Progress Notes (Signed)
INR = 1.1 Pt has been on Coumadin 2 mg daily since 11/08/12 (per Dr. Rosie Fate). She was seen today in the infusion room for her initial Coumadin clinic consult. We had an in-depth conversation about the reasons for anticoagulation (prophylaxis while on Revlimid).  We discussed the risks of Coumadin (ie. Bleeding, bruising) & the purpose of coming to our Coumadin clinic (carefully monitor INR). Pt has limited diet due to chronic diarrhea.  She avoids salads altogether. She does drink EtOH-- "too much" per pt.  She drinks 3 glasses of wine/night OR "a couple of drinks" each night.  Occasionally she drinks both.  She does not drink to the point of intoxication.  She has been drinking a steady amt since she started Coumadin.  She understands that she should avoid EtOH while on chemotherapy due to increased risk for major bleed esp if her Pltc should decrease. She has used Aleve for pain PRN but was told to avoid it while on Coumadin so she purchased some ASA.  She has not used it.  I informed her the ASA my increase her bleeding risk so she should try to use APAP instead. Pt missed a dose of Coumadin yesterday due to sleeping most of the day.  She had increased amts of diarrhea on Saturday so she didn't sleep well on Saturday night. Her albumin is low so I do think she may require lower doses of Coumadin overall, esp since she does drink EtOH regularly.  I will have her increase her Coumadin to 4 mg daily.  She had already taken her dose of 2 mg this AM so she'll take another tablet when she gets home today. Repeat protime next week w/ tx & we'll see her in infusion. Ebony Hail, Pharm.D., CPP 12/06/2012@1 :14 PM

## 2012-12-06 NOTE — Patient Instructions (Signed)
[facility] Discharge Instructions for Patients Receiving Chemotherapy  Today you received the following chemotherapy agents: Velcade. To help prevent nausea and vomiting after your treatment, we encourage you to take your nausea medication.  If you develop nausea and vomiting that is not controlled by your nausea medication, call the clinic.   BELOW ARE SYMPTOMS THAT SHOULD BE REPORTED IMMEDIATELY:  *FEVER GREATER THAN 100.5 F  *CHILLS WITH OR WITHOUT FEVER  NAUSEA AND VOMITING THAT IS NOT CONTROLLED WITH YOUR NAUSEA MEDICATION  *UNUSUAL SHORTNESS OF BREATH  *UNUSUAL BRUISING OR BLEEDING  TENDERNESS IN MOUTH AND THROAT WITH OR WITHOUT PRESENCE OF ULCERS  *URINARY PROBLEMS  *BOWEL PROBLEMS  UNUSUAL RASH Items with * indicate a potential emergency and should be followed up as soon as possible.  Feel free to call the clinic you have any questions or concerns. The clinic phone number is [phone].    

## 2012-12-08 ENCOUNTER — Ambulatory Visit: Payer: Medicare Other | Admitting: Lab

## 2012-12-08 ENCOUNTER — Ambulatory Visit: Payer: Medicare Other

## 2012-12-09 ENCOUNTER — Other Ambulatory Visit: Payer: Self-pay | Admitting: *Deleted

## 2012-12-09 DIAGNOSIS — C9000 Multiple myeloma not having achieved remission: Secondary | ICD-10-CM

## 2012-12-09 NOTE — Telephone Encounter (Signed)
THIS REFILL REQUEST FOR REVLIMID WAS GIVEN TO DR.CHISM'S NURSE, ROBIN BASS,RN. 

## 2012-12-10 MED ORDER — LENALIDOMIDE 25 MG PO CAPS: 25.0000 mg | ORAL_CAPSULE | Freq: Every day | ORAL | Status: DC

## 2012-12-10 NOTE — Telephone Encounter (Signed)
RECEIVED A FAX FROM [facility] CONCERNING A CONFIRMATION OF FACSIMILE RECEIPT FOR PT. REFERRAL. 

## 2012-12-10 NOTE — Addendum Note (Signed)
Addended by: Arvilla Meres on: 12/10/2012 01:42 PM   Modules accepted: Orders

## 2012-12-13 ENCOUNTER — Ambulatory Visit (HOSPITAL_BASED_OUTPATIENT_CLINIC_OR_DEPARTMENT_OTHER): Payer: Medicare Other

## 2012-12-13 ENCOUNTER — Other Ambulatory Visit (HOSPITAL_BASED_OUTPATIENT_CLINIC_OR_DEPARTMENT_OTHER): Payer: Medicare Other | Admitting: Lab

## 2012-12-13 ENCOUNTER — Ambulatory Visit: Payer: Medicare Other | Admitting: Pharmacist

## 2012-12-13 VITALS — BP 119/69 | HR 69 | Temp 98.0°F | Resp 18

## 2012-12-13 DIAGNOSIS — C9000 Multiple myeloma not having achieved remission: Secondary | ICD-10-CM

## 2012-12-13 DIAGNOSIS — Z5112 Encounter for antineoplastic immunotherapy: Secondary | ICD-10-CM

## 2012-12-13 DIAGNOSIS — Z7901 Long term (current) use of anticoagulants: Secondary | ICD-10-CM

## 2012-12-13 LAB — CBC WITH DIFFERENTIAL/PLATELET
BASO%: 0.6 % (ref 0.0–2.0)
EOS%: 19.6 % — ABNORMAL HIGH (ref 0.0–7.0)
HGB: 9.8 g/dL — ABNORMAL LOW (ref 11.6–15.9)
MCH: 28.7 pg (ref 25.1–34.0)
MCV: 90.9 fL (ref 79.5–101.0)
MONO#: 0.2 10*3/uL (ref 0.1–0.9)
MONO%: 7.6 % (ref 0.0–14.0)
NEUT#: 1.9 10*3/uL (ref 1.5–6.5)
NEUT%: 59.3 % (ref 38.4–76.8)
Platelets: 168 10*3/uL (ref 145–400)
RBC: 3.41 10*6/uL — ABNORMAL LOW (ref 3.70–5.45)
RDW: 16.4 % — ABNORMAL HIGH (ref 11.2–14.5)
WBC: 3.2 10*3/uL — ABNORMAL LOW (ref 3.9–10.3)
lymph#: 0.4 10*3/uL — ABNORMAL LOW (ref 0.9–3.3)
nRBC: 0 % (ref 0–0)

## 2012-12-13 LAB — PROTIME-INR
INR: 1.5 — ABNORMAL LOW (ref 2.00–3.50)
Protime: 18 Seconds — ABNORMAL HIGH (ref 10.6–13.4)

## 2012-12-13 LAB — POCT INR: INR: 1.5

## 2012-12-13 MED ORDER — BORTEZOMIB CHEMO SQ INJECTION 3.5 MG (2.5MG/ML)
1.3000 mg/m2 | Freq: Once | INTRAMUSCULAR | Status: AC
  Administered 2012-12-13: 2.75 mg via SUBCUTANEOUS
  Filled 2012-12-13: qty 2.75

## 2012-12-13 MED ORDER — ONDANSETRON HCL 8 MG PO TABS
ORAL_TABLET | ORAL | Status: AC
  Filled 2012-12-13: qty 1

## 2012-12-13 MED ORDER — ONDANSETRON HCL 8 MG PO TABS
8.0000 mg | ORAL_TABLET | Freq: Once | ORAL | Status: AC
  Administered 2012-12-13: 8 mg via ORAL

## 2012-12-13 NOTE — Patient Instructions (Signed)
[facility] Discharge Instructions for Patients Receiving Chemotherapy  Today you received the following chemotherapy agents: Velcade. To help prevent nausea and vomiting after your treatment, we encourage you to take your nausea medication.  If you develop nausea and vomiting that is not controlled by your nausea medication, call the clinic.   BELOW ARE SYMPTOMS THAT SHOULD BE REPORTED IMMEDIATELY:  *FEVER GREATER THAN 100.5 F  *CHILLS WITH OR WITHOUT FEVER  NAUSEA AND VOMITING THAT IS NOT CONTROLLED WITH YOUR NAUSEA MEDICATION  *UNUSUAL SHORTNESS OF BREATH  *UNUSUAL BRUISING OR BLEEDING  TENDERNESS IN MOUTH AND THROAT WITH OR WITHOUT PRESENCE OF ULCERS  *URINARY PROBLEMS  *BOWEL PROBLEMS  UNUSUAL RASH Items with * indicate a potential emergency and should be followed up as soon as possible.  Feel free to call the clinic you have any questions or concerns. The clinic phone number is [phone].    

## 2012-12-13 NOTE — Patient Instructions (Addendum)
INR below goal Take extra 2mg  dose tonight since you took your coumadin this morning  then Increase your Coumadin to 5 mg daily starting tomorrow using the samples provided. We will recheck your INR on 12/20/12. Lab at 1030 am  A pharmacist will see you in infusion.

## 2012-12-13 NOTE — Progress Notes (Signed)
INR remains below goal after dose increase last week (but INR has trended upwards) Pt is doing well with no complaints She reports no missed doses or extra doses No new medications or diet changes Pt continues to drink EtOH on a nightly basis (3 glasses of wine/night OR "a couple of drinks" each night) and has been consistent with this while on coumadin Pt reports no bleeding or bruising Pt seen today in infusion Will increase her dose again slightly (while being cautious with her amount of alcohol intake) Plan: Take extra 2mg  dose tonight since pt already took her 4mg  dose of coumadin this morning  then Increase Coumadin to 5 mg daily starting tomorrow. We will recheck INR on 12/20/12. Lab at 1030 am  A pharmacist will see you in infusion.  Samples provided: Coumadin 5 mg x 10 tabs LOT: [id number] Exp: 02/2014

## 2012-12-15 ENCOUNTER — Other Ambulatory Visit: Payer: Self-pay | Admitting: Hematology and Oncology

## 2012-12-15 DIAGNOSIS — C3490 Malignant neoplasm of unspecified part of unspecified bronchus or lung: Secondary | ICD-10-CM

## 2012-12-15 DIAGNOSIS — C9000 Multiple myeloma not having achieved remission: Secondary | ICD-10-CM

## 2012-12-17 ENCOUNTER — Other Ambulatory Visit: Payer: Self-pay | Admitting: Medical Oncology

## 2012-12-17 ENCOUNTER — Telehealth: Payer: Self-pay | Admitting: *Deleted

## 2012-12-17 NOTE — Telephone Encounter (Signed)
Per staff phone call and POF I have schedueld appts. Notified patient  JMW

## 2012-12-19 ENCOUNTER — Other Ambulatory Visit: Payer: Self-pay | Admitting: Internal Medicine

## 2012-12-20 ENCOUNTER — Other Ambulatory Visit (HOSPITAL_BASED_OUTPATIENT_CLINIC_OR_DEPARTMENT_OTHER): Payer: Medicare Other | Admitting: Lab

## 2012-12-20 ENCOUNTER — Other Ambulatory Visit: Payer: Medicare Other | Admitting: Lab

## 2012-12-20 ENCOUNTER — Other Ambulatory Visit: Payer: Self-pay | Admitting: Internal Medicine

## 2012-12-20 ENCOUNTER — Telehealth: Payer: Self-pay | Admitting: *Deleted

## 2012-12-20 ENCOUNTER — Telehealth: Payer: Self-pay | Admitting: Internal Medicine

## 2012-12-20 ENCOUNTER — Encounter: Payer: Self-pay | Admitting: Internal Medicine

## 2012-12-20 ENCOUNTER — Ambulatory Visit (HOSPITAL_BASED_OUTPATIENT_CLINIC_OR_DEPARTMENT_OTHER): Payer: Medicare Other

## 2012-12-20 ENCOUNTER — Ambulatory Visit (HOSPITAL_BASED_OUTPATIENT_CLINIC_OR_DEPARTMENT_OTHER): Payer: Medicare Other | Admitting: Internal Medicine

## 2012-12-20 ENCOUNTER — Ambulatory Visit: Payer: Medicare Other | Admitting: Pharmacist

## 2012-12-20 VITALS — BP 136/59 | HR 71 | Temp 98.0°F | Resp 18 | Ht 65.0 in | Wt 196.0 lb

## 2012-12-20 DIAGNOSIS — Z5112 Encounter for antineoplastic immunotherapy: Secondary | ICD-10-CM

## 2012-12-20 DIAGNOSIS — Z7901 Long term (current) use of anticoagulants: Secondary | ICD-10-CM

## 2012-12-20 DIAGNOSIS — C9000 Multiple myeloma not having achieved remission: Secondary | ICD-10-CM

## 2012-12-20 DIAGNOSIS — C3430 Malignant neoplasm of lower lobe, unspecified bronchus or lung: Secondary | ICD-10-CM

## 2012-12-20 LAB — CBC WITH DIFFERENTIAL/PLATELET
BASO%: 0.7 % (ref 0.0–2.0)
EOS%: 6.3 % (ref 0.0–7.0)
MCH: 29.5 pg (ref 25.1–34.0)
MCHC: 32.1 g/dL (ref 31.5–36.0)
MONO#: 0.4 10*3/uL (ref 0.1–0.9)
MONO%: 14.2 % — ABNORMAL HIGH (ref 0.0–14.0)
NEUT%: 55.4 % (ref 38.4–76.8)
RBC: 3.37 10*6/uL — ABNORMAL LOW (ref 3.70–5.45)
RDW: 17.6 % — ABNORMAL HIGH (ref 11.2–14.5)
WBC: 3 10*3/uL — ABNORMAL LOW (ref 3.9–10.3)
lymph#: 0.7 10*3/uL — ABNORMAL LOW (ref 0.9–3.3)

## 2012-12-20 LAB — COMPREHENSIVE METABOLIC PANEL (CC13)
ALT: 15 U/L (ref 0–55)
AST: 13 U/L (ref 5–34)
Alkaline Phosphatase: 88 U/L (ref 40–150)
CO2: 22 mEq/L (ref 22–29)
Calcium: 9.3 mg/dL (ref 8.4–10.4)
Creatinine: 0.8 mg/dL (ref 0.6–1.1)
Potassium: 4.1 mEq/L (ref 3.5–5.1)
Sodium: 142 mEq/L (ref 136–145)
Total Bilirubin: 0.42 mg/dL (ref 0.20–1.20)
Total Protein: 6.4 g/dL (ref 6.4–8.3)

## 2012-12-20 LAB — PROTIME-INR

## 2012-12-20 LAB — POCT INR: INR: 2.5

## 2012-12-20 MED ORDER — ONDANSETRON HCL 8 MG PO TABS
ORAL_TABLET | ORAL | Status: AC
  Filled 2012-12-20: qty 1

## 2012-12-20 MED ORDER — ONDANSETRON HCL 8 MG PO TABS
8.0000 mg | ORAL_TABLET | Freq: Once | ORAL | Status: AC
  Administered 2012-12-20: 8 mg via ORAL

## 2012-12-20 MED ORDER — BORTEZOMIB CHEMO SQ INJECTION 3.5 MG (2.5MG/ML)
1.3000 mg/m2 | Freq: Once | INTRAMUSCULAR | Status: AC
  Administered 2012-12-20: 2.75 mg via SUBCUTANEOUS
  Filled 2012-12-20: qty 2.75

## 2012-12-20 NOTE — Telephone Encounter (Signed)
Per staff message and POF I have scheduled appts.  JMW  

## 2012-12-20 NOTE — Patient Instructions (Signed)
Diarrhea Diarrhea is frequent loose and watery bowel movements. It can cause you to feel weak and dehydrated. Dehydration can cause you to become tired and thirsty, have a dry mouth, and have decreased urination that often is dark yellow. Diarrhea is a sign of another problem, most often an infection that will not last long. In most cases, diarrhea typically lasts 2 3 days. However, it can last longer if it is a sign of something more serious. It is important to treat your diarrhea as directed by your caregive to lessen or prevent future episodes of diarrhea. CAUSES  Some common causes include:  Gastrointestinal infections caused by viruses, bacteria, or parasites.  Food poisoning or food allergies.  Certain medicines, such as antibiotics, chemotherapy, and laxatives.  Artificial sweeteners and fructose.  Digestive disorders. HOME CARE INSTRUCTIONS  Ensure adequate fluid intake (hydration): have 1 cup (8 oz) of fluid for each diarrhea episode. Avoid fluids that contain simple sugars or sports drinks, fruit juices, whole milk products, and sodas. Your urine should be clear or pale yellow if you are drinking enough fluids. Hydrate with an oral rehydration solution that you can purchase at pharmacies, retail stores, and online. You can prepare an oral rehydration solution at home by mixing the following ingredients together:    tsp table salt.   tsp baking soda.   tsp salt substitute containing potassium chloride.  1  tablespoons sugar.  1 L (34 oz) of water.  Certain foods and beverages may increase the speed at which food moves through the gastrointestinal (GI) tract. These foods and beverages should be avoided and include:  Caffeinated and alcoholic beverages.  High-fiber foods, such as raw fruits and vegetables, nuts, seeds, and whole grain breads and cereals.  Foods and beverages sweetened with sugar alcohols, such as xylitol, sorbitol, and mannitol.  Some foods may be well  tolerated and may help thicken stool including:  Starchy foods, such as rice, toast, pasta, low-sugar cereal, oatmeal, grits, baked potatoes, crackers, and bagels.  Bananas.  Applesauce.  Add probiotic-rich foods to help increase healthy bacteria in the GI tract, such as yogurt and fermented milk products.  Wash your hands well after each diarrhea episode.  Only take over-the-counter or prescription medicines as directed by your caregiver.  Take a warm bath to relieve any burning or pain from frequent diarrhea episodes. SEEK IMMEDIATE MEDICAL CARE IF:   You are unable to keep fluids down.  You have persistent vomiting.  You have blood in your stool, or your stools are black and tarry.  You do not urinate in 6 8 hours, or there is only a small amount of very dark urine.  You have abdominal pain that increases or localizes.  You have weakness, dizziness, confusion, or lightheadedness.  You have a severe headache.  Your diarrhea gets worse or does not get better.  You have a fever or persistent symptoms for more than 2 3 days.  You have a fever and your symptoms suddenly get worse. MAKE SURE YOU:   Understand these instructions.  Will watch your condition.  Will get help right away if you are not doing well or get worse. Document Released: 01/03/2002 Document Revised: 12/31/2011 Document Reviewed: 09/21/2011 [employer] Patient Information 2014 [employer].  

## 2012-12-20 NOTE — Patient Instructions (Signed)
[facility] Discharge Instructions for Patients Receiving Chemotherapy  Today you received the following chemotherapy agents: Velcade.  To help prevent nausea and vomiting after your treatment, we encourage you to take your nausea medication as prescribed.   If you develop nausea and vomiting that is not controlled by your nausea medication, call the clinic.   BELOW ARE SYMPTOMS THAT SHOULD BE REPORTED IMMEDIATELY:  *FEVER GREATER THAN 100.5 F  *CHILLS WITH OR WITHOUT FEVER  NAUSEA AND VOMITING THAT IS NOT CONTROLLED WITH YOUR NAUSEA MEDICATION  *UNUSUAL SHORTNESS OF BREATH  *UNUSUAL BRUISING OR BLEEDING  TENDERNESS IN MOUTH AND THROAT WITH OR WITHOUT PRESENCE OF ULCERS  *URINARY PROBLEMS  *BOWEL PROBLEMS  UNUSUAL RASH Items with * indicate a potential emergency and should be followed up as soon as possible.  Feel free to call the clinic you have any questions or concerns. The clinic phone number is [phone].    

## 2012-12-20 NOTE — Telephone Encounter (Signed)
MW added tx pt aware shh

## 2012-12-20 NOTE — Progress Notes (Signed)
INR now at goal of 2-3 on coumadin 5mg  daily.  No changes in meds.  No bleeding or bruising.  Will continue coumadin 5mg  daily and check PT/INR next week with next chemo appt.  Coumadin 5mg  tabs called into [facility] on [address].

## 2012-12-20 NOTE — Telephone Encounter (Signed)
worked 11/24 POF apps made Email to JW to add tx 12/8 and 12/15 AVS and CAL printed shh

## 2012-12-21 NOTE — Progress Notes (Signed)
[name] OFFICE PROGRESS NOTE  Jessica Confer, MD [address] [place] [address]  DIAGNOSIS: Multiple myeloma, without mention of having achieved remission(203.00) - Plan: CBC with Differential, Comprehensive metabolic panel, Lactate dehydrogenase, UIFE/TP, 24 hr urine, SPEP & IFE with QIG, Kappa/lambda light chains, CBC with Differential, CBC with Differential  Chief Complaint  Patient presents with  . Multiple myeloma, without mention of having achieved remissi   PAST THERAPY: biopsy only.   CURRENT THERAPY: VRd q 21 days (Velcade 1.3 mg/m^2 [place] on Days 1,8,15; Lenalidomide 25 mg daily, on days 1 through 14; and dexamethasone 40 mg on days 1,8, and 15). Reference Para March, et. [facility] 2011; 86:57. Start date is 10/18/2012 with planned 4 cycles.  She starts cycle #4, day #1 on 12/21/2012.  She started coumadin 2 mg about one month ago.   INTERVAL HISTORY:  [name] 67 y.o. female returns for her work-up. She was last seen on 11/29/2012 and started VRd on 10/18/2012. She reports doing well overall.  She is compliant to her dexamethasone, acyclovir and revlimid prescriptions. She has continues to have mild fatigue. However, she is independent of all activities of daily living. Patient denies fever, anorexia, weight loss, headache, visual changes, confusion, drenching night sweats, palpable lymph node swelling, mucositis, odynophagia, dysphagia, nausea vomiting, jaundice, chest pain, palpitation, shortness of breath, dyspnea on exertion, productive cough, gum bleeding, epistaxis, hematemesis, hemoptysis, abdominal pain, abdominal swelling, early satiety, melena, hematochezia, hematuria, skin rash, spontaneous bleeding, joint swelling, joint pain, heat or cold intolerance, bowel bladder incontinence, back pain, focal motor weakness, paresthesia.   MEDICAL HISTORY: Past Medical History  Diagnosis Date  . Osteoporosis   . Hypercholesterolemia    . Esophageal reflux disease   . Hypertension   . Diabetes type 2, controlled   . Pancytopenia   . Ischemic colitis 2009  . Lung nodule 06/11/2012  . Depression   . Sleep apnea     UPPER AIRWAY RESISTANCE  DOES NOT HAVE CPAP , MAY NEED   . Tuberculosis      (+ TB TEST)YEARS AGO POSSIBLE EXPOSURE WAS MED TX   . Multiple myeloma 05/2012    Cytogenetics on 06/08/2012 was normal 46, XX [20]  . Liver enzyme elevation     INTERIM HISTORY: has HYPERLIPIDEMIA; HYPERTENSION; PULMONARY NODULE, LEFT UPPER LOBE; G E R D; OSTEOPOROSIS; Nonspecific (abnormal) findings on radiological and other examination of body structure; ABNORMAL RADIOLOGIAL EXAMINATION; Monoclonal gammopathy; Lung nodule; Multiple myeloma, without mention of having achieved remission(203.00); Obesity; Non-small cell lung cancer; and Non-small cell lung cancer, left on her problem list.    ALLERGIES:  has No Known Allergies.  MEDICATIONS: has a current medication list which includes the following prescription(s): acyclovir, bupropion, dexamethasone, esomeprazole, hydroxyzine, lenalidomide, losartan, metformin, metoprolol succinate, multivitamin with minerals, ondansetron, sertraline, simvastatin, and warfarin.  SURGICAL HISTORY:  Past Surgical History  Procedure Laterality Date  . Colonoscopy  2013    poly; Dr. Randa Evens, next one due 2018.l  . Appendectomy    . Tonsillectomy    . Leg surgery    . Video bronchoscopy with endobronchial navigation Right 07/08/2012    Procedure: VIDEO BRONCHOSCOPY WITH ENDOBRONCHIAL NAVIGATION;  Surgeon: Leslye Peer, MD;  Location: [facility] OR;  Service: Thoracic;  Laterality: Right;   REVIEW OF SYSTEMS:   Constitutional: Denies fevers, chills or abnormal weight loss Eyes: Denies blurriness of vision Ears, nose, mouth, throat, and face: Denies mucositis or sore throat Respiratory: Denies cough, dyspnea  or wheezes Cardiovascular: Denies palpitation, chest discomfort or lower extremity  swelling Gastrointestinal:  Denies nausea, heartburn or change in bowel habits Skin: Denies abnormal skin rashes Lymphatics: Denies new lymphadenopathy or easy bruising Neurological:Denies numbness, tingling or new weaknesses Behavioral/Psych: Mood is stable, no new changes  All other systems were reviewed with the patient and are negative.  PHYSICAL EXAMINATION: ECOG PERFORMANCE STATUS: 0 - Asymptomatic  Blood pressure 136/59, pulse 71, temperature 98 F (36.7 C), temperature source Oral, resp. rate 18, height 5\' 5"  (1.651 m), weight 196 lb (88.905 kg).  GENERAL:alert, no distress and comfortable; moderately obese.  SKIN: skin color, texture, turgor are normal, no rashes or significant lesions EYES: normal, Conjunctiva are pink and non-injected, sclera clear OROPHARYNX:no exudate, no erythema and lips, buccal mucosa, and tongue normal  NECK: supple, thyroid normal size, non-tender, without nodularity LYMPH:  no palpable lymphadenopathy in the cervical, axillary or supraclavicular LUNGS: clear to auscultation and percussion with normal breathing effort HEART: regular rate & rhythm and no murmurs and no lower extremity edema ABDOMEN:abdomen soft, non-tender and normal bowel sounds Musculoskeletal:no cyanosis of digits and no clubbing  NEURO: alert & oriented x 3 with fluent speech, no focal motor/sensory deficits  LABORATORY DATA: Results for orders placed in visit on 12/20/12 (from the past 48 hour(s))  CBC WITH DIFFERENTIAL     Status: Abnormal   Collection Time    12/20/12 10:31 AM      Result Value Range   WBC 3.0 (*) 3.9 - 10.3 10e3/uL   NEUT# 1.7  1.5 - 6.5 10e3/uL   HGB 9.9 (*) 11.6 - 15.9 g/dL   HCT 16.1 (*) 09.6 - 04.5 %   Platelets 175  145 - 400 10e3/uL   MCV 91.8  79.5 - 101.0 fL   MCH 29.5  25.1 - 34.0 pg   MCHC 32.1  31.5 - 36.0 g/dL   RBC 4.09 (*) 8.11 - 9.14 10e6/uL   RDW 17.6 (*) 11.2 - 14.5 %   lymph# 0.7 (*) 0.9 - 3.3 10e3/uL   MONO# 0.4  0.1 - 0.9 10e3/uL    Eosinophils Absolute 0.2  0.0 - 0.5 10e3/uL   Basophils Absolute 0.0  0.0 - 0.1 10e3/uL   NEUT% 55.4  38.4 - 76.8 %   LYMPH% 23.4  14.0 - 49.7 %   MONO% 14.2 (*) 0.0 - 14.0 %   EOS% 6.3  0.0 - 7.0 %   BASO% 0.7  0.0 - 2.0 %  PROTIME-INR     Status: Abnormal   Collection Time    12/20/12 10:31 AM      Result Value Range   Protime 30.0 (*) 10.6 - 13.4 Seconds   INR 2.50  2.00 - 3.50   Comment: INR is useful only to assess adequacy of anticoagulation with coumadin when comparing results from different labs. It should not be used to estimate bleeding risk or presence/abscense of coagulopathy in patients not on coumadin. Expected INR ranges for      nontherapeutic patients is 0.88 - 1.12.   Lovenox No    COMPREHENSIVE METABOLIC PANEL (CC13)     Status: Abnormal   Collection Time    12/20/12 10:31 AM      Result Value Range   Sodium 142  136 - 145 mEq/L   Potassium 4.1  3.5 - 5.1 mEq/L   Chloride 109  98 - 109 mEq/L   CO2 22  22 - 29 mEq/L   Glucose 136  70 - 140  mg/dl   BUN 16.1  7.0 - 09.6 mg/dL   Creatinine 0.8  0.6 - 1.1 mg/dL   Total Bilirubin 0.45  0.20 - 1.20 mg/dL   Alkaline Phosphatase 88  40 - 150 U/L   AST 13  5 - 34 U/L   ALT 15  0 - 55 U/L   Total Protein 6.4  6.4 - 8.3 g/dL   Albumin 3.1 (*) 3.5 - 5.0 g/dL   Calcium 9.3  8.4 - 40.9 mg/dL   Anion Gap 10  3 - 11 mEq/L     Labs:  Lab Results  Component Value Date   WBC 3.0* 12/20/2012   HGB 9.9* 12/20/2012   HCT 30.9* 12/20/2012   MCV 91.8 12/20/2012   PLT 175 12/20/2012   NEUTROABS 1.7 12/20/2012      Chemistry      Component Value Date/Time   NA 142 12/20/2012 1031   NA 139 07/07/2012 1353   K 4.1 12/20/2012 1031   K 4.2 07/07/2012 1353   CL 103 07/07/2012 1353   CL 105 06/08/2012 0913   CO2 22 12/20/2012 1031   CO2 27 07/07/2012 1353   BUN 12.8 12/20/2012 1031   BUN 17 07/07/2012 1353   CREATININE 0.8 12/20/2012 1031   CREATININE 0.65 07/07/2012 1353      Component Value Date/Time   CALCIUM 9.3  12/20/2012 1031   CALCIUM 10.1 07/07/2012 1353   ALKPHOS 88 12/20/2012 1031   ALKPHOS 94 07/07/2012 1353   AST 13 12/20/2012 1031   AST 38* 07/07/2012 1353   ALT 15 12/20/2012 1031   ALT 43* 07/07/2012 1353   BILITOT 0.42 12/20/2012 1031   BILITOT 0.4 07/07/2012 1353     CBC:  Recent Labs Lab 12/20/12 1031  WBC 3.0*  NEUTROABS 1.7  HGB 9.9*  HCT 30.9*  MCV 91.8  PLT 175     RADIOGRAPHIC STUDIES: No results found.  ASSESSMENT: [name] 67 y.o. female with a history of Multiple myeloma, without mention of having achieved remission(203.00) - Plan: CBC with Differential, Comprehensive metabolic panel, Lactate dehydrogenase, UIFE/TP, 24 hr urine, SPEP & IFE with QIG, Kappa/lambda light chains, CBC with Differential, CBC with Differential  PLAN:  1. Diagnosis #1: IgG Kappa Multiple Myeloma: Finished treatment for her NSCLC (as noted below), we began  VRd regiment for her disease as follows for 4 cycles:   Cycle # 4 (length: 21 days_ Day #1 is 12/21/2012  Velcade 1.3 mg/m^2 [place] Days 1, 8, and 15  Revlimide 25 mg PO Daily, on days 1 through 14  Decadron 40 mg PO Days 1, 8 and 15   -- On prior visits, we discussed the indications for her treatment which includes to control her multiple myeloma and to reduce effects on renal function, bony involvement and calcium. The side effects include increased nausea, increased risk of clotting (we also provided a prescription of lovenox prophylaxis), infection risks particularly an increased risk of herpes zoster and infections not related to neutropenia (we also provide a prescription for acyclovir). Other side effects included myelotoxicity, neuropathy secondary to bortezomib and other non-hematological toxicities. She understood these risks versus the benefits and she choose to proceed with VRd on 10/18/2012. She completed cycle #1-3 without incidence.  --We will assess her CBC with differential, electrolytes, renal function, liver function and  M protein level prior to starting each cycle. CBC will be performed on day 8 and 15 doses of velcade. We will perform assessments for neuropathy and/or  neuropathic pain.  --We changed her venous thromboembolic prophylaxis from lovenox to coumadin for a goal INR of 2-3 based on her high risk (Obesity, s/p lung cancer, multiple myeloma) based on A Palumbo et al, Leukemia (2008) [phone]. She was having to pay a significant amount out of pocket.  Referral made to coumadin clinic for a/c recommendations and follow-up.  Her goal INR will be 2. -- Evaluation by BMT team after 4 cycles, [name] who felt that patient a good candidate for autologous stem cell transplant. She was instructed to call at the beginning of her 4th cycle.   2. Diagnosis #2: Adenocarcinoma nonsmall cell lung cancer, stage I. S/p lobectomy as noted above. We will follow-up per NCCN Guidelines.   3. Follow-up. Patient will require a chemotherapy infusion appointment weekly for her Velcade. She should follow-up with me in three weeks and was instructed to call Dr. Herb Grays office.   She was provided handouts about the side-effects of each chemotherapy on prior visits.   All questions were answered. The patient knows to call the clinic with any problems, questions or concerns. We can certainly see the patient much sooner if necessary.  I spent 15 minutes counseling the patient face to face. The total time spent in the appointment was 25 minutes.    Jessica Polhamus, MD 12/21/2012 11:58 AM

## 2012-12-27 ENCOUNTER — Ambulatory Visit: Payer: Medicare Other

## 2012-12-27 ENCOUNTER — Other Ambulatory Visit: Payer: Medicare Other | Admitting: Lab

## 2012-12-28 ENCOUNTER — Ambulatory Visit: Payer: Medicare Other | Admitting: Pharmacist

## 2012-12-28 ENCOUNTER — Other Ambulatory Visit (HOSPITAL_BASED_OUTPATIENT_CLINIC_OR_DEPARTMENT_OTHER): Payer: Medicare Other

## 2012-12-28 ENCOUNTER — Ambulatory Visit (HOSPITAL_BASED_OUTPATIENT_CLINIC_OR_DEPARTMENT_OTHER): Payer: Medicare Other

## 2012-12-28 DIAGNOSIS — Z7901 Long term (current) use of anticoagulants: Secondary | ICD-10-CM

## 2012-12-28 DIAGNOSIS — C9000 Multiple myeloma not having achieved remission: Secondary | ICD-10-CM

## 2012-12-28 DIAGNOSIS — Z5112 Encounter for antineoplastic immunotherapy: Secondary | ICD-10-CM

## 2012-12-28 LAB — CBC WITH DIFFERENTIAL/PLATELET
BASO%: 0 % (ref 0.0–2.0)
Basophils Absolute: 0 10*3/uL (ref 0.0–0.1)
HCT: 31.9 % — ABNORMAL LOW (ref 34.8–46.6)
HGB: 10 g/dL — ABNORMAL LOW (ref 11.6–15.9)
LYMPH%: 8.3 % — ABNORMAL LOW (ref 14.0–49.7)
MCHC: 31.3 g/dL — ABNORMAL LOW (ref 31.5–36.0)
MONO#: 0.3 10*3/uL (ref 0.1–0.9)
MONO%: 6.2 % (ref 0.0–14.0)
NEUT%: 85.3 % — ABNORMAL HIGH (ref 38.4–76.8)
Platelets: 240 10*3/uL (ref 145–400)
WBC: 4.8 10*3/uL (ref 3.9–10.3)

## 2012-12-28 LAB — POCT INR
INR: 2.4
INR: 2.4

## 2012-12-28 MED ORDER — ONDANSETRON HCL 8 MG PO TABS
ORAL_TABLET | ORAL | Status: AC
  Filled 2012-12-28: qty 1

## 2012-12-28 MED ORDER — ONDANSETRON HCL 8 MG PO TABS
8.0000 mg | ORAL_TABLET | Freq: Once | ORAL | Status: AC
  Administered 2012-12-28: 8 mg via ORAL

## 2012-12-28 MED ORDER — BORTEZOMIB CHEMO SQ INJECTION 3.5 MG (2.5MG/ML)
1.3000 mg/m2 | Freq: Once | INTRAMUSCULAR | Status: AC
  Administered 2012-12-28: 2.75 mg via SUBCUTANEOUS
  Filled 2012-12-28: qty 2.75

## 2012-12-28 NOTE — Patient Instructions (Signed)
[facility] Discharge Instructions for Patients Receiving Chemotherapy  Today you received the following chemotherapy agent Velcade injection.  To help prevent nausea and vomiting after your treatment, we encourage you to take your nausea medication.   If you develop nausea and vomiting that is not controlled by your nausea medication, call the clinic.   BELOW ARE SYMPTOMS THAT SHOULD BE REPORTED IMMEDIATELY:  *FEVER GREATER THAN 100.5 F  *CHILLS WITH OR WITHOUT FEVER  NAUSEA AND VOMITING THAT IS NOT CONTROLLED WITH YOUR NAUSEA MEDICATION  *UNUSUAL SHORTNESS OF BREATH  *UNUSUAL BRUISING OR BLEEDING  TENDERNESS IN MOUTH AND THROAT WITH OR WITHOUT PRESENCE OF ULCERS  *URINARY PROBLEMS  *BOWEL PROBLEMS  UNUSUAL RASH Items with * indicate a potential emergency and should be followed up as soon as possible.  Feel free to call the clinic you have any questions or concerns. The clinic phone number is [phone].    

## 2012-12-28 NOTE — Progress Notes (Signed)
Pt seen during infusion today. INR=2.4 PT is on 5mg  daily. No changes to report. No new meds, s/s of bleeding or clotting Continue Coumadin 5 mg daily. We will recheck your INR on 01/17/13 with her infusion appmt..  Lab at 10:45 am.  A pharmacist will see you in infusion.

## 2012-12-28 NOTE — Patient Instructions (Signed)
Continue Coumadin 5 mg daily. We will recheck your INR on 01/17/13. Lab at 10:45 am.  A pharmacist will see you in infusion

## 2013-01-03 ENCOUNTER — Telehealth: Payer: Self-pay

## 2013-01-03 ENCOUNTER — Other Ambulatory Visit: Payer: Self-pay | Admitting: Internal Medicine

## 2013-01-03 ENCOUNTER — Telehealth: Payer: Self-pay | Admitting: Internal Medicine

## 2013-01-03 ENCOUNTER — Other Ambulatory Visit: Payer: Self-pay | Admitting: *Deleted

## 2013-01-03 ENCOUNTER — Ambulatory Visit (HOSPITAL_COMMUNITY)
Admission: RE | Admit: 2013-01-03 | Discharge: 2013-01-03 | Disposition: A | Discharge status: Home / Self Care | Payer: Medicare Other | Source: Ambulatory Visit | Attending: Internal Medicine | Admitting: Internal Medicine

## 2013-01-03 ENCOUNTER — Ambulatory Visit (HOSPITAL_BASED_OUTPATIENT_CLINIC_OR_DEPARTMENT_OTHER): Payer: Medicare Other

## 2013-01-03 ENCOUNTER — Telehealth: Payer: Self-pay | Admitting: *Deleted

## 2013-01-03 ENCOUNTER — Other Ambulatory Visit (HOSPITAL_BASED_OUTPATIENT_CLINIC_OR_DEPARTMENT_OTHER): Payer: Medicare Other

## 2013-01-03 ENCOUNTER — Other Ambulatory Visit (HOSPITAL_COMMUNITY): Payer: Self-pay | Admitting: Student

## 2013-01-03 VITALS — BP 115/45 | HR 68 | Temp 97.4°F

## 2013-01-03 DIAGNOSIS — Z1231 Encounter for screening mammogram for malignant neoplasm of breast: Secondary | ICD-10-CM | POA: Insufficient documentation

## 2013-01-03 DIAGNOSIS — C9000 Multiple myeloma not having achieved remission: Secondary | ICD-10-CM

## 2013-01-03 DIAGNOSIS — Z5112 Encounter for antineoplastic immunotherapy: Secondary | ICD-10-CM

## 2013-01-03 LAB — CBC WITH DIFFERENTIAL/PLATELET
BASO%: 3.4 % — ABNORMAL HIGH (ref 0.0–2.0)
EOS%: 5.7 % (ref 0.0–7.0)
HCT: 29 % — ABNORMAL LOW (ref 34.8–46.6)
HGB: 9.3 g/dL — ABNORMAL LOW (ref 11.6–15.9)
MCH: 28.7 pg (ref 25.1–34.0)
MCHC: 32.1 g/dL (ref 31.5–36.0)
MCV: 89.5 fL (ref 79.5–101.0)
MONO#: 0.3 10*3/uL (ref 0.1–0.9)
MONO%: 12.1 % (ref 0.0–14.0)
NEUT%: 57.6 % (ref 38.4–76.8)
Platelets: 190 10*3/uL (ref 145–400)
RDW: 15.9 % — ABNORMAL HIGH (ref 11.2–14.5)
WBC: 2.6 10*3/uL — ABNORMAL LOW (ref 3.9–10.3)
lymph#: 0.6 10*3/uL — ABNORMAL LOW (ref 0.9–3.3)

## 2013-01-03 MED ORDER — ONDANSETRON HCL 8 MG PO TABS
8.0000 mg | ORAL_TABLET | Freq: Once | ORAL | Status: AC
  Administered 2013-01-03: 8 mg via ORAL

## 2013-01-03 MED ORDER — ONDANSETRON HCL 8 MG PO TABS
ORAL_TABLET | ORAL | Status: AC
  Filled 2013-01-03: qty 1

## 2013-01-03 MED ORDER — BORTEZOMIB CHEMO SQ INJECTION 3.5 MG (2.5MG/ML)
1.3000 mg/m2 | Freq: Once | INTRAMUSCULAR | Status: AC
  Administered 2013-01-03: 2.75 mg via SUBCUTANEOUS
  Filled 2013-01-03: qty 2.75

## 2013-01-03 NOTE — Telephone Encounter (Signed)
lvm that Dr Rosie Fate will discuss her questions about remission during her next OV 12/15. Also mentioned that we are keeping in close communication with [facility]

## 2013-01-03 NOTE — Patient Instructions (Signed)
[facility] Discharge Instructions for Patients Receiving Chemotherapy  Today you received the following chemotherapy agents: velcade  To help prevent nausea and vomiting after your treatment, we encourage you to take your nausea medication.  Take it as often as prescribed.     If you develop nausea and vomiting that is not controlled by your nausea medication, call the clinic. If it is after clinic hours your family physician or the after hours number for the clinic or go to the Emergency Department.   BELOW ARE SYMPTOMS THAT SHOULD BE REPORTED IMMEDIATELY:  *FEVER GREATER THAN 100.5 F  *CHILLS WITH OR WITHOUT FEVER  NAUSEA AND VOMITING THAT IS NOT CONTROLLED WITH YOUR NAUSEA MEDICATION  *UNUSUAL SHORTNESS OF BREATH  *UNUSUAL BRUISING OR BLEEDING  TENDERNESS IN MOUTH AND THROAT WITH OR WITHOUT PRESENCE OF ULCERS  *URINARY PROBLEMS  *BOWEL PROBLEMS  UNUSUAL RASH Items with * indicate a potential emergency and should be followed up as soon as possible.  Feel free to call the clinic you have any questions or concerns. The clinic phone number is [phone].   I have been informed and understand all the instructions given to me. I know to contact the clinic, my physician, or go to the Emergency Department if any problems should occur. I do not have any questions at this time, but understand that I may call the clinic during office hours   should I have any questions or need assistance in obtaining follow up care.    __________________________________________  _____________  __________ Signature of Patient or Authorized Representative            Date                   Time    __________________________________________ Nurse's Signature    

## 2013-01-03 NOTE — Telephone Encounter (Signed)
per 12/8 POF lab appt made for 12/10 pt advised shh

## 2013-01-03 NOTE — Telephone Encounter (Signed)
Patient asking if she is in remission yet and if not, how will she know she is in remission? Also asking how closely [name] is keeping Dr. Rosie Fate informed of their plans there and Dr. Rosie Fate is keeping [facility] informed of her status. Currently on SQ Velcade,Revlimid/Decadron. She is here for Velcade today. Made her aware that MD or his nurse will call her back later today or even tomorrow.

## 2013-01-03 NOTE — Telephone Encounter (Signed)
THIS REFILL REQUEST FOR REVLIMID WAS GIVEN TO DR.CHISM'S NURSE, KATHY BUYCK,RN. 

## 2013-01-05 ENCOUNTER — Telehealth: Payer: Self-pay

## 2013-01-05 ENCOUNTER — Other Ambulatory Visit: Payer: Medicare Other

## 2013-01-05 ENCOUNTER — Telehealth: Payer: Self-pay | Admitting: *Deleted

## 2013-01-05 NOTE — Telephone Encounter (Signed)
Received refill request from [facility] for Revlimid. Request forwarded to MD. Next office visit 12/15 with lab.

## 2013-01-05 NOTE — Telephone Encounter (Signed)
Pt called stating she is sick and cannot make lab appt today. She is nauseated and achey, she has a cough and has to clear her throat frequently, phlegm is not colored. No vomiting, no fever, no nasal congestion or head symptoms, no diarrhea. Told pt to come in for lab later this week when she is able. To give [place] a call if she gets worse or gets fever. She did have velcade on 01/03/13. Dr Rosie Fate informed.

## 2013-01-06 ENCOUNTER — Other Ambulatory Visit (HOSPITAL_BASED_OUTPATIENT_CLINIC_OR_DEPARTMENT_OTHER): Payer: Medicare Other

## 2013-01-06 DIAGNOSIS — C9000 Multiple myeloma not having achieved remission: Secondary | ICD-10-CM

## 2013-01-06 LAB — LACTATE DEHYDROGENASE (CC13): LDH: 138 U/L (ref 125–245)

## 2013-01-06 LAB — CBC WITH DIFFERENTIAL/PLATELET
BASO%: 0.6 % (ref 0.0–2.0)
EOS%: 3.4 % (ref 0.0–7.0)
HCT: 28 % — ABNORMAL LOW (ref 34.8–46.6)
LYMPH%: 13.5 % — ABNORMAL LOW (ref 14.0–49.7)
MCH: 29.6 pg (ref 25.1–34.0)
MCHC: 32.4 g/dL (ref 31.5–36.0)
MCV: 91.4 fL (ref 79.5–101.0)
MONO#: 0.6 10*3/uL (ref 0.1–0.9)
MONO%: 16.9 % — ABNORMAL HIGH (ref 0.0–14.0)
NEUT%: 65.6 % (ref 38.4–76.8)
Platelets: 132 10*3/uL — ABNORMAL LOW (ref 145–400)
WBC: 3.5 10*3/uL — ABNORMAL LOW (ref 3.9–10.3)

## 2013-01-06 LAB — COMPREHENSIVE METABOLIC PANEL (CC13)
AST: 9 U/L (ref 5–34)
Albumin: 2.9 g/dL — ABNORMAL LOW (ref 3.5–5.0)
Alkaline Phosphatase: 102 U/L (ref 40–150)
Anion Gap: 11 mEq/L (ref 3–11)
BUN: 13.2 mg/dL (ref 7.0–26.0)
Creatinine: 0.7 mg/dL (ref 0.6–1.1)
Glucose: 117 mg/dl (ref 70–140)
Potassium: 3.8 mEq/L (ref 3.5–5.1)
Sodium: 141 mEq/L (ref 136–145)
Total Bilirubin: 0.36 mg/dL (ref 0.20–1.20)
Total Protein: 6.6 g/dL (ref 6.4–8.3)

## 2013-01-07 ENCOUNTER — Other Ambulatory Visit: Payer: Self-pay | Admitting: *Deleted

## 2013-01-07 LAB — PROTEIN / CREATININE RATIO, URINE
Creatinine, Urine: 165.8 mg/dL
Protein Creatinine Ratio: 0.12 (ref <0.15)
Total Protein, Urine: 20 mg/dL

## 2013-01-07 NOTE — Telephone Encounter (Signed)
THIS REFILL REQUEST FOR REVLIMID WAS GIVEN TO DR.CHISM'S NURSE, KATHY BUYCK,RN. 

## 2013-01-10 ENCOUNTER — Ambulatory Visit: Payer: Medicare Other

## 2013-01-10 ENCOUNTER — Ambulatory Visit (HOSPITAL_BASED_OUTPATIENT_CLINIC_OR_DEPARTMENT_OTHER): Payer: Medicare Other | Admitting: Pharmacist

## 2013-01-10 ENCOUNTER — Ambulatory Visit (HOSPITAL_BASED_OUTPATIENT_CLINIC_OR_DEPARTMENT_OTHER): Payer: Medicare Other | Admitting: Internal Medicine

## 2013-01-10 ENCOUNTER — Encounter: Payer: Self-pay | Admitting: Internal Medicine

## 2013-01-10 ENCOUNTER — Telehealth: Payer: Self-pay | Admitting: Internal Medicine

## 2013-01-10 ENCOUNTER — Other Ambulatory Visit (HOSPITAL_BASED_OUTPATIENT_CLINIC_OR_DEPARTMENT_OTHER): Payer: Medicare Other

## 2013-01-10 VITALS — BP 154/68 | HR 80 | Temp 97.8°F | Resp 17 | Ht 65.0 in | Wt 197.9 lb

## 2013-01-10 DIAGNOSIS — C3430 Malignant neoplasm of lower lobe, unspecified bronchus or lung: Secondary | ICD-10-CM

## 2013-01-10 DIAGNOSIS — Z7901 Long term (current) use of anticoagulants: Secondary | ICD-10-CM

## 2013-01-10 DIAGNOSIS — C9000 Multiple myeloma not having achieved remission: Secondary | ICD-10-CM

## 2013-01-10 DIAGNOSIS — C3490 Malignant neoplasm of unspecified part of unspecified bronchus or lung: Secondary | ICD-10-CM

## 2013-01-10 DIAGNOSIS — C3492 Malignant neoplasm of unspecified part of left bronchus or lung: Secondary | ICD-10-CM

## 2013-01-10 LAB — SPEP & IFE WITH QIG
Albumin ELP: 51.3 % — ABNORMAL LOW (ref 55.8–66.1)
Alpha-2-Globulin: 15.5 % — ABNORMAL HIGH (ref 7.1–11.8)
Beta Globulin: 8.8 % — ABNORMAL HIGH (ref 4.7–7.2)
IgA: 122 mg/dL (ref 69–380)
IgG (Immunoglobin G), Serum: 577 mg/dL — ABNORMAL LOW (ref 690–1700)
IgM, Serum: 10 mg/dL — ABNORMAL LOW (ref 52–322)
Total Protein, Serum Electrophoresis: 6 g/dL (ref 6.0–8.3)

## 2013-01-10 LAB — KAPPA/LAMBDA LIGHT CHAINS
Kappa free light chain: 1.66 mg/dL (ref 0.33–1.94)
Lambda Free Lght Chn: 1.27 mg/dL (ref 0.57–2.63)

## 2013-01-10 LAB — PROTIME-INR: INR: 4.5 — ABNORMAL HIGH (ref 2.00–3.50)

## 2013-01-10 LAB — POCT INR: INR: 4.5

## 2013-01-10 LAB — BETA 2 MICROGLOBULIN, SERUM: Beta-2 Microglobulin: 2.29 mg/L — ABNORMAL HIGH (ref 1.01–1.73)

## 2013-01-10 NOTE — Telephone Encounter (Signed)
Email to Dr Rosie Fate w questions about 12/15 POF shh

## 2013-01-10 NOTE — Patient Instructions (Signed)
Bone Marrow Transplantation and Peripheral Blood Stem Cell Transplantation, Questions and Answers  WHAT ARE BONE MARROW TRANSPLANTATION AND PERIPHERAL BLOOD STEM CELL TRANSPLANTATION?   Bone marrow transplantation (BMT) and peripheral blood stem cell transplantation (PBSCT) are procedures that restore stem cells that have been destroyed by high doses of chemotherapy or radiation therapy.  There are 3 types of transplants:  Patients receive their own stem cells (autologous transplants).  Patients receive stem cells from their identical twin (syngeneic transplants).  Patients receive stem cells from someone other than themselves or an identical twin. The patient's brother, sister, parent, or a person not related to the patient may be used (allogeneic transplants). KEY POINTS   In general, patients are less likely to develop a complication known as graft-versus-host disease (GVHD) if the stem cells of the donor and patient are closely matched.  After being treated with high-dose anticancer drugs or radiation, the patient receives the harvested stem cells. They travel to the bone marrow and begin to produce new blood cells.  A "mini-transplant" uses lower, less toxic doses of chemotherapy or radiation to prepare the patient for transplant.  A "tandem transplant" involves 2 sequential courses of high-dose chemotherapy and stem cell transplant.  The National Marrow Donor Program (NMDP) maintains an international registry of volunteer stem cell donors. WHAT ARE BONE MARROW AND HEMATOPOIETIC STEM CELLS?   Bone marrow is the soft, sponge-like material found inside bones. Bone marrow contains a specific kind of cell that creates blood-forming stem cells (hematopoietic). Hematopoietic stem cells divide to form more blood-forming stem cells, or they mature into 1 of 3 types of blood cells:  White blood cells, which fight infection.  Red blood cells, which carry oxygen.  Platelets, which help the  blood to clot.  Most hematopoietic stem cells are found in the bone marrow. Some cells, called peripheral blood stem cells (PBSCs), are found in the bloodstream. Blood in the umbilical cord also contains hematopoietic stem cells. Cells from any of these sources can be used in transplants. WHY ARE BMT AND PBSCT USED IN CANCER TREATMENT?  One reason BMT and PBSCT are used in cancer treatment is to make it possible for patients to receive very high doses of chemotherapy or radiation therapy. To understand more about why BMT and PBSCT are used, it is helpful to understand how chemotherapy and radiation therapy work.   Chemotherapy and radiation therapy generally affect cells that divide rapidly. They are used to treat cancer because cancer cells divide more often than most healthy cells. However, because bone marrow cells also divide frequently, high-dose treatments can severely damage or destroy the patient's bone marrow. Without healthy bone marrow, the patient is no longer able to make the blood cells needed to:  Carry oxygen.  Fight infection.  Prevent bleeding. BMT and PBSCT replace stem cells that were destroyed by treatment. The healthy, transplanted stem cells can restore the bone marrow's ability to produce the blood cells the patient needs.  In some types of leukemia, the graft-versus-tumor (GVT) effect that occurs after allogeneic BMT and PBSCT is crucial to the effectiveness of the treatment. GVT occurs when white blood cells from the donor identify the cancer cells that remain in the patient's body after the chemotherapy or radiation therapy (the tumor) as foreign. They then attack them.  WHAT TYPES OF CANCER USE BMT AND PBSCT?  BMT and PBSCT are most commonly used in the treatment of leukemia and lymphoma. They are most effective when the leukemia or lymphoma is  in remission. This means that the signs and symptoms of cancer have disappeared. BMT and PBSCT are also used to treat other  cancers such as:  Cancer that arises in immature nerve cells and affects mostly infants and children (neuroblastoma).  Cancer of the plasma cells (multiple myeloma). Researchers are evaluating BMT and PBSCT for the treatment of various types of cancer. HOW ARE THE DONOR'S STEM CELLS MATCHED TO THE PATIENT'S STEM CELLS IN ALLOGENEIC OR SYNGENEIC TRANSPLANTATION?   To minimize potential side effects, caregivers most often use transplanted stem cells that match the patient's own stem cells as closely as possible. People have different sets of proteins on the surface of their cells. They are called human leukocyte-associated (HLA) antigens. The set of proteins, called the HLA type, is identified by a blood test.  In most cases, the success of allogeneic transplantation depends in part on how well the HLA antigens of the donor's stem cells match those of the recipient's stem cells. The higher the number of matching HLA antigens, the greater the chance that the patient's body will accept the donor's stem cells. In general, patients are less likely to develop the complication known as GVHD if the stem cells of the donor and patient are closely matched.  Close relatives, especially brothers and sisters, are more likely than unrelated people to be HLA-matched. But only 25% to 35% of patients have an HLA-matched sibling. The chances of obtaining HLA-matched stem cells from an unrelated donor are slightly better. It is approximately 50%. Among unrelated donors, HLA-matching is greatly improved when the donor and recipient have the same ethnic and racial background. The number of donors is increasing. However, individuals from certain ethnic and racial groups still have a lower chance of finding a matching donor. Large volunteer donor registries can help in finding an appropriate, unrelated donor.  Identical twins have the same genes, so they have the same set of HLA antigens. As a result, the patient's body will  accept a transplant from an identical twin. However, identical twins represent a small number of all births, so syngeneic transplantation is rare. HOW IS BONE MARROW OBTAINED FOR TRANSPLANTATION?  The stem cells used in BMT come from the liquid center of the bone. This is called the marrow. In general, the procedure for obtaining bone marrow, which is called harvesting, is similar for all 3 types of BMTs (autologous, syngeneic, and allogeneic). The donor is given either general or local anesthetic. General anesthetic puts the person to sleep during the procedure. Local anesthetic causes loss of feeling below the waist. Needles are inserted through the skin over the hip (pelvic) bone, or in rare cases, the breastbone (sternum). Then the needles go into the bone marrow to draw the marrow out of the bone. Harvesting the marrow takes about 1 hour. The harvested bone marrow is then processed to remove blood and bone fragments. Harvested bone marrow can be combined with a preservative and frozen to keep the stem cells alive until they are needed. This technique is known as cryopreservation. Stem cells can be preserved this way for many years.  HOW ARE PBSCS OBTAINED FOR TRANSPLANTATION?  The stem cells used in PBSCT come from the bloodstream. A process called apheresis is used to obtain PBSCs for transplantation. For 4 or 5 days before apheresis, the donor may be given a medicine to increase the number of stem cells released into the bloodstream. In apheresis, blood is removed through a large vein in the arm or a central  venous catheter. This is a flexible tube that is placed in a large vein in the neck, chest, or groin area. The blood goes through a machine that removes the stem cells. The blood is then returned to the donor. The collected cells are then stored. Apheresis typically takes 4 to 6 hours. The stem cells are then frozen until they are given to the recipient. Apheresis may also be called  leukapheresis. HOW ARE UMBILICAL CORD STEM CELLS OBTAINED FOR TRANSPLANTATION?   Stem cells also may be retrieved from umbilical cord blood. For this to occur, the mother must contact a cord blood bank before the baby's birth. The cord blood bank may request that she complete a questionnaire and give a small blood sample.  Cord blood banks may be public or commercial. Public cord blood banks accept donations of cord blood. They may provide the donated stem cells to another matched individual in their network. In contrast, commercial cord blood banks will store the cord blood for the family. This is in case it is needed later for the child or another family member.  After the baby is born and the umbilical cord has been cut, blood is retrieved from the umbilical cord and placenta. This process does not cause health risks to the mother or the child. If the mother agrees, the umbilical cord blood is processed and frozen for storage by the cord blood bank. Only a small amount of blood can be retrieved from the umbilical cord and placenta, so the collected stem cells are typically used for children or small adults. ARE ANY RISKS ASSOCIATED WITH DONATING BONE MARROW?   Only a small amount of bone marrow is removed. Donating usually does not pose any significant problems for the donor. The most serious risk involves the use of anesthesia during the procedure.  The area where the bone marrow was taken out may feel stiff or sore for a few days. The donor may feel tired. Within a few weeks, the donor's body replaces the donated marrow. The time required for a donor to recover varies. Some people are back to their usual routine within 2 or 3 days. Others may take up to 3 to 4 weeks to fully recover their strength. ARE ANY RISKS ASSOCIATED WITH DONATING PBSCS?  Apheresis usually causes little discomfort. During apheresis, the patient may feel:  Lightheaded.  Chills.  Numbness around the lips.  Cramping in  the hands. Unlike bone marrow donation, PBSC donation does not require anesthesia. The medicine that is given to stimulate the release of stem cells from the marrow into the bloodstream may cause:  Bone and muscle aches.  Headaches.  Fatigue.  Nausea.  Vomiting.  Difficulty sleeping. These side effects generally stop within 2 to 3 days of the last dose of the medicine.  HOW DOES THE PATIENT RECEIVE THE STEM CELLS DURING THE TRANSPLANT?  After being treated with high-dose anticancer drugs or radiation, the patient receives the stem cells through an intravenous line (IV). This part of the transplant takes 1 to 5 hours.  ARE ANY SPECIAL MEASURES TAKEN WHEN THE CANCER PATIENT IS ALSO THE DONOR (AUTOLOGOUS TRANSPLANT)?  The stem cells used for autologous transplantation must be relatively free of cancer cells. The harvested cells can sometimes be treated before transplantation. A process known as purging is used to get rid of cancer cells. This process can remove some cancer cells from the harvested cells and minimize the chance that cancer will come back. Purging may damage some  healthy stem cells, so more cells are obtained from the patient before the transplant. This is to ensure that enough healthy stem cells will remain after purging.  WHAT HAPPENS AFTER THE STEM CELLS HAVE BEEN TRANSPLANTED TO THE PATIENT?  After entering the bloodstream, the stem cells travel to the bone marrow. There they begin to produce new white blood cells, red blood cells, and platelets in a process known as engraftment. Engraftment usually occurs within about 2 to 4 weeks after transplantation. Your caregivers monitor it by checking blood counts on a frequent basis. Complete recovery of immune function takes much longer. It can take up to several months for autologous transplant recipients and 1 to 2 years for patients receiving allogeneic or syngeneic transplants. Caregivers evaluate the results of various blood tests  to confirm that new blood cells are being produced and that the cancer has not returned. The removal of a small sample of bone marrow through a needle for examination under a microscope (bone marrow aspiration) can also help caregivers determine how well the new marrow is working.  WHAT ARE THE POSSIBLE SIDE EFFECTS OF BMT AND PBSCT?   The major risk of both treatments is an increased susceptibility to infection and bleeding as a result of the high-dose cancer treatment. Caregivers may give the patient antibiotics to prevent or treat infection. They may also give the patient transfusions of platelets to prevent bleeding and red blood cells to treat anemia. Patients who undergo BMT and PBSCT may experience short-term side effects. These include:  Nausea.  Vomiting.  Fatigue.  Loss of appetite.  Mouth sores.  Hair loss.  Skin reactions.  Potential long-term risks include complications of the pre-transplant chemotherapy and radiation therapy, such as:  Inability to produce children (infertility).  Clouding of the lens of the eye (cataracts). This causes loss of vision.  New (secondary) cancers.  Damage to the liver, kidneys, lungs, or heart.  With allogeneic transplants, the complication known as GVHD sometimes develops. GVHD occurs when white blood cells from the donor identify cells in the patient's body as foreign and attack them. The most commonly damaged organs are the skin, liver, and intestines. This complication can develop within a few weeks of the transplant (acute GVHD) or much later (chronic GVHD). To prevent this complication, the patient may receive medicines that suppress the immune system. Also, the donated stem cells can be treated to remove the white blood cells that cause GVHD. This is a process called T-cell depletion. If GVHD develops, it can be very serious. It is treated with steroids or other immunosuppressive agents. GVHD can be difficult to treat. However, some  studies suggest that patients with leukemia who develop GVHD are less likely to have the cancer come back. Clinical trials are being conducted to find ways to prevent and treat GVHD.  The likelihood and severity of complications are specific to the patient's treatment. They should be discussed with your caregiver. WHAT IS A "MINI-TRANSPLANT"?   A "mini-transplant" is also called a non-myeloablative or reduced-intensity transplant. It is a type of allogeneic transplant. This approach is being studied in clinical trials for the treatment of several types of cancer. These include:  Leukemia.  Lymphoma.  Multiple myeloma.  Other cancers of the blood.  A mini-transplant uses lower, less toxic doses of chemotherapy or radiation to prepare the patient for an allogeneic transplant. The use of lower doses of anticancer drugs and radiation eliminates some, but not all, of the patient's bone marrow. It also  reduces the number of cancer cells. It also suppresses the patient's immune system to prevent rejection of the transplant.  Unlike traditional BMT or PBSCT, cells from both the donor and the patient may exist in the patient's body for some time after a mini-transplant. Once the cells from the donor begin to engraft, they may cause the GVT effect and work to destroy the cancer cells that were not eliminated by the anticancer drugs or radiation. To boost the GVT effect, the patient may be given an injection of the donor's white blood cells. This procedure is called a donor lymphocyte infusion. WHAT IS A "TANDEM TRANSPLANT"?  A "tandem transplant" is a type of autologous transplant. This method is being studied in clinical trials for the treatment of several types of cancer, including multiple myeloma and germ cell cancer. During a tandem transplant, a patient receives 2 courses of high-dose chemotherapy, one after another (sequential), with stem cell transplant. Typically, the 2 courses are given several  weeks to several months apart. Researchers hope that this method can prevent the cancer from coming back at a later time.  FOR MORE INFORMATION  National Marrow Donor Program: www.marrow.org The NMDP is a [employer]. It was created to improve the effectiveness of the search for donors. The NMDP maintains an international registry of volunteers willing to be donors for all sources of blood stem cells used in transplantation:  Bone marrow.  Peripheral blood.  Umbilical cord blood. The NMDP website contains a list of participating transplant centers. The list includes descriptions of the centers as well as their transplant experience, survival statistics, research interests, pre-transplant costs, and contact information.  Document Released: 09/25/2003 Document Revised: 04/07/2011 Document Reviewed: 01/10/2008 [facility] Patient Information 2014 [place], [place].

## 2013-01-10 NOTE — Progress Notes (Signed)
INR above goal today Pt added on for coumadin clinic visit after office visit with Dr. Rosie Fate and lab result showing elevated INR Pt is doing well with no complaints She reports no unusual bleeding or bruising No missed or extra doses (she states she has been taking 5 mg daily as instructed) Pt had been stable on current dose x ~1 month Chemotherapy with revlimid and Velcade currently on hold in anticipation for lab studies and scheduling of stem cell transplant that will take place at [facility] Pt states she already took today's dose of coumadin Plan for coumadin: Hold coumadin tomorrow (Tuesday 01/11/13) Then decrease dose to 2.5 mg (half tablet) on Wednesdays and Fridays and 5 mg all other days We will recheck your INR on 01/17/13. Lab at 10:45 am.  CC at 11am (appointments scheduled already)

## 2013-01-10 NOTE — Patient Instructions (Signed)
INR above goal Hold coumadin tomorrow (Tuesday 01/11/13) Then decrease dose to 2.5 mg (half tablet) on Wednesdays and Fridays and 5 mg all other days We will recheck your INR on 01/17/13. Lab at 10:45 am.  CC at 11am

## 2013-01-11 ENCOUNTER — Telehealth: Payer: Self-pay | Admitting: Internal Medicine

## 2013-01-11 NOTE — Progress Notes (Signed)
[facility] OFFICE PROGRESS NOTE  [name], MD [address] [place] [address]  DIAGNOSIS: Multiple myeloma, without mention of having achieved remission(203.00) - Plan: Iron and TIBC, Ferritin, CBC with Differential, COMPLETE METABOLIC PANEL WITH GFR  Non-small cell lung cancer, left  Chief Complaint  Patient presents with  . Multiple myeloma, without mention of having achieved remissi   PAST THERAPY: biopsy only.   CURRENT THERAPY: VRd q 21 days (Velcade 1.3 mg/m^2 [place] on Days 1,8,15; Lenalidomide 25 mg daily, on days 1 through 14; and dexamethasone 40 mg on days 1,8, and 15). Reference Para March, et. [facility] Hematol 2011; 86:57. Start date is 10/18/2012 with planned 4 cycles. She starts cycle #4, day #1 on 12/21/2012 and completed it about one week ago.  She started coumadin 2 mg about one month ago.   INTERVAL HISTORY:  [name] 67 y.o. female returns for her work-up. She was last seen on 12/20/2012 and started VRd on 10/18/2012. She reports doing well overall.  She is compliant to her dexamethasone, acyclovir and revlimid prescriptions. She has continues to have mild fatigue. However, she is independent of all activities of daily living. Patient denies fever, anorexia, weight loss, headache, visual changes, confusion, drenching night sweats, palpable lymph node swelling, mucositis, odynophagia, dysphagia, nausea vomiting, jaundice, chest pain, palpitation, shortness of breath, dyspnea on exertion, productive cough, gum bleeding, epistaxis, hematemesis, hemoptysis, abdominal pain, abdominal swelling, early satiety, melena, hematochezia, hematuria, skin rash, spontaneous bleeding, joint swelling, joint pain, heat or cold intolerance, bowel bladder incontinence, back pain, focal motor weakness, paresthesia.   MEDICAL HISTORY: Past Medical History  Diagnosis Date  . Osteoporosis   . Hypercholesterolemia   . Esophageal reflux disease   .  Hypertension   . Diabetes type 2, controlled   . Pancytopenia   . Ischemic colitis 2009  . Lung nodule 06/11/2012  . Depression   . Sleep apnea     UPPER AIRWAY RESISTANCE  DOES NOT HAVE CPAP , MAY NEED   . Tuberculosis      (+ TB TEST)YEARS AGO POSSIBLE EXPOSURE WAS MED TX   . Multiple myeloma 05/2012    Cytogenetics on 06/08/2012 was normal 46, XX [20]  . Liver enzyme elevation     INTERIM HISTORY: has HYPERLIPIDEMIA; HYPERTENSION; PULMONARY NODULE, LEFT UPPER LOBE; G E R D; OSTEOPOROSIS; Nonspecific (abnormal) findings on radiological and other examination of body structure; ABNORMAL RADIOLOGIAL EXAMINATION; Monoclonal gammopathy; Lung nodule; Multiple myeloma, without mention of having achieved remission(203.00); Obesity; Non-small cell lung cancer; and Non-small cell lung cancer, left on her problem list.    ALLERGIES:  has No Known Allergies.  MEDICATIONS: has a current medication list which includes the following prescription(s): acyclovir, bupropion, dexamethasone, esomeprazole, lenalidomide, losartan, metformin, metoprolol succinate, multivitamin with minerals, ondansetron, sertraline, simvastatin, and warfarin.  SURGICAL HISTORY:  Past Surgical History  Procedure Laterality Date  . Colonoscopy  2013    poly; Dr. Randa Evens, next one due 2018.l  . Appendectomy    . Tonsillectomy    . Leg surgery    . Video bronchoscopy with endobronchial navigation Right 07/08/2012    Procedure: VIDEO BRONCHOSCOPY WITH ENDOBRONCHIAL NAVIGATION;  Surgeon: Leslye Peer, MD;  Location: [facility] OR;  Service: Thoracic;  Laterality: Right;   REVIEW OF SYSTEMS:   Constitutional: Denies fevers, chills or abnormal weight loss Eyes: Denies blurriness of vision Ears, nose, mouth, throat, and face: Denies mucositis or sore throat Respiratory: Denies cough, dyspnea or wheezes Cardiovascular:  Denies palpitation, chest discomfort or lower extremity swelling Gastrointestinal:  Denies nausea, heartburn or change  in bowel habits Skin: Denies abnormal skin rashes Lymphatics: Denies new lymphadenopathy or easy bruising Neurological:Denies numbness, tingling or new weaknesses Behavioral/Psych: Mood is stable, no new changes  All other systems were reviewed with the patient and are negative.  PHYSICAL EXAMINATION: ECOG PERFORMANCE STATUS: 0 - Asymptomatic  Blood pressure 154/68, pulse 80, temperature 97.8 F (36.6 C), temperature source Oral, resp. rate 17, height 5\' 5"  (1.651 m), weight 197 lb 14.4 oz (89.767 kg), SpO2 95.00%.  GENERAL:alert, no distress and comfortable; moderately obese.  SKIN: skin color, texture, turgor are normal, no rashes or significant lesions EYES: normal, Conjunctiva are pink and non-injected, sclera clear OROPHARYNX:no exudate, no erythema and lips, buccal mucosa, and tongue normal  NECK: supple, thyroid normal size, non-tender, without nodularity LYMPH:  no palpable lymphadenopathy in the cervical, axillary or supraclavicular LUNGS: clear to auscultation and percussion with normal breathing effort HEART: regular rate & rhythm and no murmurs and no lower extremity edema ABDOMEN:abdomen soft, non-tender and normal bowel sounds Musculoskeletal:no cyanosis of digits and no clubbing  NEURO: alert & oriented x 3 with fluent speech, no focal motor/sensory deficits  LABORATORY DATA: Results for orders placed in visit on 01/10/13 (from the past 48 hour(s))  PROTIME-INR     Status: Abnormal   Collection Time    01/10/13  9:34 AM      Result Value Range   Protime 54.0 (*) 10.6 - 13.4 Seconds   INR 4.50 (*) 2.00 - 3.50   Comment: INR is useful only to assess adequacy of anticoagulation with coumadin when comparing results from different labs. It should not be used to estimate bleeding risk or presence/abscense of coagulopathy in patients not on coumadin. Expected INR ranges for      nontherapeutic patients is 0.88 - 1.12.   Lovenox No       Labs:  Lab Results  Component  Value Date   WBC 3.5* 01/06/2013   HGB 9.1* 01/06/2013   HCT 28.0* 01/06/2013   MCV 91.4 01/06/2013   PLT 132* 01/06/2013   NEUTROABS 2.3 01/06/2013      Chemistry      Component Value Date/Time   NA 141 01/06/2013 1442   NA 139 07/07/2012 1353   K 3.8 01/06/2013 1442   K 4.2 07/07/2012 1353   CL 103 07/07/2012 1353   CL 105 06/08/2012 0913   CO2 24 01/06/2013 1442   CO2 27 07/07/2012 1353   BUN 13.2 01/06/2013 1442   BUN 17 07/07/2012 1353   CREATININE 0.7 01/06/2013 1442   CREATININE 0.65 07/07/2012 1353      Component Value Date/Time   CALCIUM 9.3 01/06/2013 1442   CALCIUM 10.1 07/07/2012 1353   ALKPHOS 102 01/06/2013 1442   ALKPHOS 94 07/07/2012 1353   AST 9 01/06/2013 1442   AST 38* 07/07/2012 1353   ALT 14 01/06/2013 1442   ALT 43* 07/07/2012 1353   BILITOT 0.36 01/06/2013 1442   BILITOT 0.4 07/07/2012 1353     CBC:  Recent Labs Lab 01/06/13 1442  WBC 3.5*  NEUTROABS 2.3  HGB 9.1*  HCT 28.0*  MCV 91.4  PLT 132*     RADIOGRAPHIC STUDIES: No results found.  ASSESSMENT: [name] 67 y.o. female with a history of Multiple myeloma, without mention of having achieved remission(203.00) - Plan: Iron and TIBC, Ferritin, CBC with Differential, COMPLETE METABOLIC PANEL WITH GFR  Non-small cell lung  cancer, left  PLAN:  1. Diagnosis #1: IgG Kappa Multiple Myeloma: Finished treatment for her NSCLC (as noted below), we completed VRd regiment for her disease as follows for 4 cycles:   Velcade 1.3 mg/m^2 [place] Days 1, 8, and 15  Revlimide 25 mg PO Daily, on days 1 through 14  Decadron 40 mg PO Days 1, 8 and 15   -- On prior visits, we discussed the indications for her treatment which includes to control her multiple myeloma and to reduce effects on renal function, bony involvement and calcium. The side effects include increased nausea, increased risk of clotting (we also provided a prescription of lovenox prophylaxis), infection risks particularly an increased risk of  herpes zoster and infections not related to neutropenia (we also provide a prescription for acyclovir). Other side effects included myelotoxicity, neuropathy secondary to bortezomib and other non-hematological toxicities. She understood these risks versus the benefits and she choose to proceed with VRd on 10/18/2012. She completed cycle #1-3 without incidence.  --We assesed her CBC with differential, electrolytes, renal function, liver function and M protein level prior to starting each cycle. CBC will be performed on day 8 and 15 doses of velcade. We performed assessments for neuropathy and/or neuropathic pain.  --We changed her venous thromboembolic prophylaxis from lovenox to coumadin for a goal INR of 2-3 based on her high risk (Obesity, s/p lung cancer, multiple myeloma) based on A Palumbo et al, Leukemia (2008) [phone]. She was having to pay a significant amount out of pocket.  Referral made to coumadin clinic for a/c recommendations and follow-up.  Her goal INR will be 2. Today she is elevated and will follow the instructions of a/c clinic.  -- Evaluation by BMT team after 4 cycles, Dr.Zachariah Everardo Beals who felt that patient a good candidate for autologous stem cell transplant. She was instructed to call at the beginning of her 4th cycle. She has appointment on 12/19 for bone marrow biopsy. Her M-Spike is 0.18 on 12/11 down from 1.69; Kappa:lamda ratio is 1.31 down from 24.97;  Total protein in urine is 20 mg down from 108 mg per day.  Her IgG levels have normalized. These are consistent with a near  very good response per the revised uniform response criteria by the international myeloma working group.   2. Diagnosis #2: Adenocarcinoma nonsmall cell lung cancer, stage I. S/p lobectomy as noted above. We will follow-up per NCCN Guidelines. She is scheduled for follow up later this week.    3. Follow-up.  She should follow-up with me in two weeks and was instructed to follow up in Dr. Herb Grays  office.   She was provided handouts about the side-effects of each chemotherapy on prior visits.   All questions were answered. The patient knows to call the clinic with any problems, questions or concerns. We can certainly see the patient much sooner if necessary.  I spent 15 minutes counseling the patient face to face. The total time spent in the appointment was 25 minutes.    Heiley Shaikh, MD 01/11/2013 5:47 AM

## 2013-01-11 NOTE — Telephone Encounter (Signed)
sw pt adv of 12/22 and 12/29 appts per POF pt voiced understanding shh

## 2013-01-12 ENCOUNTER — Other Ambulatory Visit: Payer: Medicare Other

## 2013-01-12 LAB — UIFE/LIGHT CHAINS/TP QN, 24-HR UR
Albumin, U: DETECTED
Alpha 1, Urine: DETECTED — AB
Alpha 2, Urine: DETECTED — AB
Beta, Urine: DETECTED — AB
Free Kappa Lt Chains,Ur: 3.01 mg/dL — ABNORMAL HIGH (ref 0.14–2.42)
Free Lambda Excretion/Day: 6.84 mg/d
Free Lambda Lt Chains,Ur: 0.57 mg/dL (ref 0.02–0.67)
Free Lt Chn Excr Rate: 36.12 mg/d
Gamma Globulin, Urine: DETECTED — AB
Total Protein, Urine-Ur/day: 76 mg/d (ref 10–140)
Volume, Urine: 1200 mL

## 2013-01-17 ENCOUNTER — Other Ambulatory Visit: Payer: Medicare Other

## 2013-01-17 ENCOUNTER — Ambulatory Visit: Payer: Medicare Other

## 2013-01-17 ENCOUNTER — Telehealth: Payer: Self-pay | Admitting: Pharmacist

## 2013-01-18 ENCOUNTER — Other Ambulatory Visit (HOSPITAL_BASED_OUTPATIENT_CLINIC_OR_DEPARTMENT_OTHER): Payer: Medicare Other

## 2013-01-18 ENCOUNTER — Telehealth: Payer: Self-pay | Admitting: Internal Medicine

## 2013-01-18 ENCOUNTER — Ambulatory Visit (HOSPITAL_BASED_OUTPATIENT_CLINIC_OR_DEPARTMENT_OTHER): Payer: Medicare Other | Admitting: Pharmacist

## 2013-01-18 ENCOUNTER — Other Ambulatory Visit: Payer: Medicare Other

## 2013-01-18 DIAGNOSIS — Z7901 Long term (current) use of anticoagulants: Secondary | ICD-10-CM

## 2013-01-18 DIAGNOSIS — Z5181 Encounter for therapeutic drug level monitoring: Secondary | ICD-10-CM

## 2013-01-18 DIAGNOSIS — C9000 Multiple myeloma not having achieved remission: Secondary | ICD-10-CM

## 2013-01-18 DIAGNOSIS — C3492 Malignant neoplasm of unspecified part of left bronchus or lung: Secondary | ICD-10-CM

## 2013-01-18 DIAGNOSIS — C3490 Malignant neoplasm of unspecified part of unspecified bronchus or lung: Secondary | ICD-10-CM

## 2013-01-18 LAB — PROTIME-INR
INR: 2.3 (ref 2.00–3.50)
Protime: 27.6 Seconds — ABNORMAL HIGH (ref 10.6–13.4)

## 2013-01-18 MED ORDER — ACYCLOVIR 400 MG PO TABS: 400.0000 mg | ORAL_TABLET | Freq: Two times a day (BID) | ORAL | Status: DC

## 2013-01-18 NOTE — Progress Notes (Signed)
INR = 2.3 Pt completed Rev/Dex/Velcade & is awaiting SCT. Plan was anticoagulation prophylaxis while on revlimid due to her risk factors (obesity, hx of multiple malignancies with lung cancer and myeloma). In Dr. Benjiman Core absence, I s/w Dr. Truett Perna (on-call) & he agrees that pt can stop taking coumadin. She will stay on Acyclovir for now (refilled at [facility] today for her). Thank you for allowing [place] to assist w/ pts anticoagulation.  At this point we will archive her from our clinic. Ebony Hail, Pharm.D., CPP 01/18/2013@11 :09 AM

## 2013-01-18 NOTE — Telephone Encounter (Signed)
pt LVMM wanting to know time of appts for 12/23 Called pt adv to be here at 1015 am for labs shh

## 2013-01-21 ENCOUNTER — Telehealth: Payer: Self-pay | Admitting: Internal Medicine

## 2013-01-21 NOTE — Telephone Encounter (Signed)
Talked to pt , she is aware of appt on 01/24/13 lab and MD

## 2013-01-24 ENCOUNTER — Other Ambulatory Visit: Payer: Medicare Other

## 2013-01-24 ENCOUNTER — Ambulatory Visit: Payer: Medicare Other

## 2013-01-24 ENCOUNTER — Encounter: Payer: Self-pay | Admitting: Medical Oncology

## 2013-01-26 ENCOUNTER — Telehealth: Payer: Self-pay | Admitting: Internal Medicine

## 2013-01-26 ENCOUNTER — Other Ambulatory Visit: Payer: Self-pay | Admitting: Internal Medicine

## 2013-01-26 ENCOUNTER — Telehealth: Payer: Self-pay | Admitting: Medical Oncology

## 2013-01-26 ENCOUNTER — Other Ambulatory Visit: Payer: Self-pay | Admitting: Medical Oncology

## 2013-01-26 NOTE — Telephone Encounter (Signed)
lvm for pt regarding to 1.2 and 1.3 inj.Marland KitchenMarland KitchenMarland Kitchen

## 2013-01-26 NOTE — Telephone Encounter (Signed)
Karen-Rike-RN [facility] called stating pt will be discharged and will need neupogen 1/02,1/03,1/04. I explained that we can do Friday and Saturday but we will not be able to do on Sunday. She states they can arrange for the pt to come to their day center on 1/04. Received faxed orders and given to Dr. Rosie Fate.

## 2013-01-27 HISTORY — PX: OTHER SURGICAL HISTORY: SHX169

## 2013-01-28 ENCOUNTER — Ambulatory Visit (HOSPITAL_BASED_OUTPATIENT_CLINIC_OR_DEPARTMENT_OTHER): Payer: Medicare Other

## 2013-01-28 VITALS — BP 127/71 | HR 74 | Temp 98.2°F

## 2013-01-28 DIAGNOSIS — C3430 Malignant neoplasm of lower lobe, unspecified bronchus or lung: Secondary | ICD-10-CM

## 2013-01-28 DIAGNOSIS — C9000 Multiple myeloma not having achieved remission: Secondary | ICD-10-CM | POA: Diagnosis not present

## 2013-01-28 DIAGNOSIS — Z5189 Encounter for other specified aftercare: Secondary | ICD-10-CM

## 2013-01-28 MED ORDER — FILGRASTIM 480 MCG/0.8ML IJ SOLN
960.0000 ug | Freq: Once | INTRAMUSCULAR | Status: AC
  Administered 2013-01-28: 960 ug via SUBCUTANEOUS
  Filled 2013-01-28: qty 1.6

## 2013-01-28 NOTE — Patient Instructions (Signed)
Filgrastim, G-CSF injection What is this medicine? FILGRASTIM, G-CSF (fil GRA stim) stimulates the formation of white blood cells. This medicine is given to patients with conditions that may cause a decrease in white blood cells, like those receiving certain types of chemotherapy or bone marrow transplant. It helps the bone marrow recover its ability to produce white blood cells. Increasing the amount of white blood cells helps to decrease the risk of infection and fever. This medicine may be used for other purposes; ask your health care provider or pharmacist if you have questions. COMMON BRAND NAME(S): Neupogen What should I tell my health care provider before I take this medicine? They need to know if you have any of these conditions: -currently receiving radiation therapy -sickle cell disease -an unusual or allergic reaction to filgrastim, E. coli protein, other medicines, foods, dyes, or preservatives -pregnant or trying to get pregnant -breast-feeding How should I use this medicine? This medicine is for injection into a vein or injection under the skin. It is usually given by a health care professional in a hospital or clinic setting. If you get this medicine at home, you will be taught how to prepare and give this medicine. Always change the site for the injection under the skin. Let the solution warm to room temperature before you use it. Do not shake the solution before you withdraw a dose. Throw away any unused portion. Use exactly as directed. Take your medicine at regular intervals. Do not take your medicine more often than directed. It is important that you put your used needles and syringes in a special sharps container. Do not put them in a trash can. If you do not have a sharps container, call your pharmacist or healthcare provider to get one. Talk to your pediatrician regarding the use of this medicine in children. While this medicine may be prescribed for children for selected  conditions, precautions do apply. Overdosage: If you think you have taken too much of this medicine contact a poison control center or emergency room at once. NOTE: This medicine is only for you. Do not share this medicine with others. What if I miss a dose? Try not to miss doses. If you miss a dose take the dose as soon as you remember. If it is almost time for the next dose, do not take double doses unless told to by your doctor or health care professional. What may interact with this medicine? -lithium -medicines for cancer chemotherapy This list may not describe all possible interactions. Give your health care provider a list of all the medicines, herbs, non-prescription drugs, or dietary supplements you use. Also tell them if you smoke, drink alcohol, or use illegal drugs. Some items may interact with your medicine. What should I watch for while using this medicine? Visit your doctor or health care professional for regular checks on your progress. If you get a fever or any sign of infection while you are using this medicine, do not treat yourself. Check with your doctor or health care professional. Bone pain can usually be relieved by mild pain relievers such as acetaminophen or ibuprofen. Check with your doctor or health care professional before taking these medicines as they may hide a fever. Call your doctor or health care professional if the aches and pains are severe or do not go away. What side effects may I notice from receiving this medicine? Side effects that you should report to your doctor or health care professional as soon as possible: -allergic reactions  like skin rash, itching or hives, swelling of the face, lips, or tongue -difficulty breathing, wheezing -fever -pain, redness, or swelling at the injection site -stomach or side pain, or pain at the shoulder Side effects that usually do not require medical attention (report to your doctor or health care professional if they  continue or are bothersome): -bone pain (ribs, lower back, breast bone) -headache -skin rash This list may not describe all possible side effects. Call your doctor for medical advice about side effects. You may report side effects to FDA at 1-800-FDA-1088. Where should I keep my medicine? Keep out of the reach of children. Store in a refrigerator between 2 and 8 degrees C (36 and 46 degrees F). Do not freeze or leave in direct sunlight. If vials or syringes are left out of the refrigerator for more than 24 hours, they must be thrown away. Throw away unused vials after the expiration date on the carton. NOTE: This sheet is a summary. It may not cover all possible information. If you have questions about this medicine, talk to your doctor, pharmacist, or health care provider.  2014, Elsevier/Gold Standard. (2007-03-31 13:33:21)

## 2013-01-29 ENCOUNTER — Ambulatory Visit (HOSPITAL_BASED_OUTPATIENT_CLINIC_OR_DEPARTMENT_OTHER): Payer: Medicare Other

## 2013-01-29 VITALS — BP 141/58 | HR 82 | Temp 97.6°F

## 2013-01-29 DIAGNOSIS — Z5189 Encounter for other specified aftercare: Secondary | ICD-10-CM | POA: Diagnosis not present

## 2013-01-29 DIAGNOSIS — C9000 Multiple myeloma not having achieved remission: Secondary | ICD-10-CM | POA: Diagnosis not present

## 2013-01-29 DIAGNOSIS — C3430 Malignant neoplasm of lower lobe, unspecified bronchus or lung: Secondary | ICD-10-CM | POA: Diagnosis not present

## 2013-01-29 MED ORDER — FILGRASTIM 480 MCG/0.8ML IJ SOLN
960.0000 ug | Freq: Once | INTRAMUSCULAR | Status: AC
  Administered 2013-01-29: 960 ug via SUBCUTANEOUS

## 2013-01-29 NOTE — Patient Instructions (Signed)
Filgrastim, G-CSF injection What is this medicine? FILGRASTIM, G-CSF (fil GRA stim) stimulates the formation of white blood cells. This medicine is given to patients with conditions that may cause a decrease in white blood cells, like those receiving certain types of chemotherapy or bone marrow transplant. It helps the bone marrow recover its ability to produce white blood cells. Increasing the amount of white blood cells helps to decrease the risk of infection and fever. This medicine may be used for other purposes; ask your health care provider or pharmacist if you have questions. COMMON BRAND NAME(S): Neupogen What should I tell my health care provider before I take this medicine? They need to know if you have any of these conditions: -currently receiving radiation therapy -sickle cell disease -an unusual or allergic reaction to filgrastim, E. coli protein, other medicines, foods, dyes, or preservatives -pregnant or trying to get pregnant -breast-feeding How should I use this medicine? This medicine is for injection into a vein or injection under the skin. It is usually given by a health care professional in a hospital or clinic setting. If you get this medicine at home, you will be taught how to prepare and give this medicine. Always change the site for the injection under the skin. Let the solution warm to room temperature before you use it. Do not shake the solution before you withdraw a dose. Throw away any unused portion. Use exactly as directed. Take your medicine at regular intervals. Do not take your medicine more often than directed. It is important that you put your used needles and syringes in a special sharps container. Do not put them in a trash can. If you do not have a sharps container, call your pharmacist or healthcare provider to get one. Talk to your pediatrician regarding the use of this medicine in children. While this medicine may be prescribed for children for selected  conditions, precautions do apply. Overdosage: If you think you have taken too much of this medicine contact a poison control center or emergency room at once. NOTE: This medicine is only for you. Do not share this medicine with others. What if I miss a dose? Try not to miss doses. If you miss a dose take the dose as soon as you remember. If it is almost time for the next dose, do not take double doses unless told to by your doctor or health care professional. What may interact with this medicine? -lithium -medicines for cancer chemotherapy This list may not describe all possible interactions. Give your health care provider a list of all the medicines, herbs, non-prescription drugs, or dietary supplements you use. Also tell them if you smoke, drink alcohol, or use illegal drugs. Some items may interact with your medicine. What should I watch for while using this medicine? Visit your doctor or health care professional for regular checks on your progress. If you get a fever or any sign of infection while you are using this medicine, do not treat yourself. Check with your doctor or health care professional. Bone pain can usually be relieved by mild pain relievers such as acetaminophen or ibuprofen. Check with your doctor or health care professional before taking these medicines as they may hide a fever. Call your doctor or health care professional if the aches and pains are severe or do not go away. What side effects may I notice from receiving this medicine? Side effects that you should report to your doctor or health care professional as soon as possible: -allergic reactions  like skin rash, itching or hives, swelling of the face, lips, or tongue -difficulty breathing, wheezing -fever -pain, redness, or swelling at the injection site -stomach or side pain, or pain at the shoulder Side effects that usually do not require medical attention (report to your doctor or health care professional if they  continue or are bothersome): -bone pain (ribs, lower back, breast bone) -headache -skin rash This list may not describe all possible side effects. Call your doctor for medical advice about side effects. You may report side effects to FDA at 1-800-FDA-1088. Where should I keep my medicine? Keep out of the reach of children. Store in a refrigerator between 2 and 8 degrees C (36 and 46 degrees F). Do not freeze or leave in direct sunlight. If vials or syringes are left out of the refrigerator for more than 24 hours, they must be thrown away. Throw away unused vials after the expiration date on the carton. NOTE: This sheet is a summary. It may not cover all possible information. If you have questions about this medicine, talk to your doctor, pharmacist, or health care provider.  2014, Elsevier/Gold Standard. (2007-03-31 13:33:21)

## 2013-01-30 DIAGNOSIS — Z5112 Encounter for antineoplastic immunotherapy: Secondary | ICD-10-CM | POA: Diagnosis not present

## 2013-01-30 DIAGNOSIS — C9000 Multiple myeloma not having achieved remission: Secondary | ICD-10-CM | POA: Diagnosis not present

## 2013-01-31 DIAGNOSIS — Z79899 Other long term (current) drug therapy: Secondary | ICD-10-CM | POA: Diagnosis not present

## 2013-01-31 DIAGNOSIS — C9000 Multiple myeloma not having achieved remission: Secondary | ICD-10-CM | POA: Diagnosis not present

## 2013-02-01 DIAGNOSIS — C9000 Multiple myeloma not having achieved remission: Secondary | ICD-10-CM | POA: Diagnosis not present

## 2013-02-02 DIAGNOSIS — C9000 Multiple myeloma not having achieved remission: Secondary | ICD-10-CM | POA: Diagnosis not present

## 2013-02-02 DIAGNOSIS — Z79899 Other long term (current) drug therapy: Secondary | ICD-10-CM | POA: Diagnosis not present

## 2013-02-04 DIAGNOSIS — C9000 Multiple myeloma not having achieved remission: Secondary | ICD-10-CM | POA: Diagnosis not present

## 2013-02-07 DIAGNOSIS — F329 Major depressive disorder, single episode, unspecified: Secondary | ICD-10-CM | POA: Diagnosis present

## 2013-02-07 DIAGNOSIS — D696 Thrombocytopenia, unspecified: Secondary | ICD-10-CM | POA: Diagnosis present

## 2013-02-07 DIAGNOSIS — M810 Age-related osteoporosis without current pathological fracture: Secondary | ICD-10-CM | POA: Diagnosis present

## 2013-02-07 DIAGNOSIS — R0602 Shortness of breath: Secondary | ICD-10-CM | POA: Diagnosis not present

## 2013-02-07 DIAGNOSIS — E119 Type 2 diabetes mellitus without complications: Secondary | ICD-10-CM | POA: Diagnosis present

## 2013-02-07 DIAGNOSIS — R911 Solitary pulmonary nodule: Secondary | ICD-10-CM | POA: Diagnosis not present

## 2013-02-07 DIAGNOSIS — R0609 Other forms of dyspnea: Secondary | ICD-10-CM | POA: Diagnosis not present

## 2013-02-07 DIAGNOSIS — K219 Gastro-esophageal reflux disease without esophagitis: Secondary | ICD-10-CM | POA: Diagnosis present

## 2013-02-07 DIAGNOSIS — Z9889 Other specified postprocedural states: Secondary | ICD-10-CM | POA: Diagnosis not present

## 2013-02-07 DIAGNOSIS — Z452 Encounter for adjustment and management of vascular access device: Secondary | ICD-10-CM | POA: Diagnosis not present

## 2013-02-07 DIAGNOSIS — Z87891 Personal history of nicotine dependence: Secondary | ICD-10-CM | POA: Diagnosis not present

## 2013-02-07 DIAGNOSIS — Z902 Acquired absence of lung [part of]: Secondary | ICD-10-CM | POA: Diagnosis not present

## 2013-02-07 DIAGNOSIS — Z85118 Personal history of other malignant neoplasm of bronchus and lung: Secondary | ICD-10-CM | POA: Diagnosis not present

## 2013-02-07 DIAGNOSIS — D649 Anemia, unspecified: Secondary | ICD-10-CM | POA: Diagnosis present

## 2013-02-07 DIAGNOSIS — I5030 Unspecified diastolic (congestive) heart failure: Secondary | ICD-10-CM | POA: Diagnosis present

## 2013-02-07 DIAGNOSIS — I379 Nonrheumatic pulmonary valve disorder, unspecified: Secondary | ICD-10-CM | POA: Diagnosis not present

## 2013-02-07 DIAGNOSIS — T451X5A Adverse effect of antineoplastic and immunosuppressive drugs, initial encounter: Secondary | ICD-10-CM | POA: Diagnosis not present

## 2013-02-07 DIAGNOSIS — E876 Hypokalemia: Secondary | ICD-10-CM | POA: Diagnosis not present

## 2013-02-07 DIAGNOSIS — R0902 Hypoxemia: Secondary | ICD-10-CM | POA: Diagnosis present

## 2013-02-07 DIAGNOSIS — I509 Heart failure, unspecified: Secondary | ICD-10-CM | POA: Diagnosis not present

## 2013-02-07 DIAGNOSIS — M129 Arthropathy, unspecified: Secondary | ICD-10-CM | POA: Diagnosis present

## 2013-02-07 DIAGNOSIS — I10 Essential (primary) hypertension: Secondary | ICD-10-CM | POA: Diagnosis present

## 2013-02-07 DIAGNOSIS — E8779 Other fluid overload: Secondary | ICD-10-CM | POA: Diagnosis not present

## 2013-02-07 DIAGNOSIS — R9431 Abnormal electrocardiogram [ECG] [EKG]: Secondary | ICD-10-CM | POA: Diagnosis not present

## 2013-02-07 DIAGNOSIS — C9000 Multiple myeloma not having achieved remission: Secondary | ICD-10-CM | POA: Diagnosis not present

## 2013-02-07 DIAGNOSIS — J90 Pleural effusion, not elsewhere classified: Secondary | ICD-10-CM | POA: Diagnosis not present

## 2013-02-07 DIAGNOSIS — F3289 Other specified depressive episodes: Secondary | ICD-10-CM | POA: Diagnosis present

## 2013-02-07 DIAGNOSIS — D61810 Antineoplastic chemotherapy induced pancytopenia: Secondary | ICD-10-CM | POA: Diagnosis not present

## 2013-02-07 DIAGNOSIS — E785 Hyperlipidemia, unspecified: Secondary | ICD-10-CM | POA: Diagnosis present

## 2013-02-07 DIAGNOSIS — Z79899 Other long term (current) drug therapy: Secondary | ICD-10-CM | POA: Diagnosis not present

## 2013-02-07 DIAGNOSIS — Z9484 Stem cells transplant status: Secondary | ICD-10-CM | POA: Diagnosis not present

## 2013-02-07 DIAGNOSIS — J811 Chronic pulmonary edema: Secondary | ICD-10-CM | POA: Diagnosis not present

## 2013-02-07 DIAGNOSIS — R197 Diarrhea, unspecified: Secondary | ICD-10-CM | POA: Diagnosis not present

## 2013-02-25 ENCOUNTER — Telehealth: Payer: Self-pay | Admitting: Medical Oncology

## 2013-02-25 NOTE — Telephone Encounter (Signed)
Becky-nurse with [facility] called to inform us that pt will be discharged home. She had a stem cell transplant. She has mild heart failure and has had trouble with her oxygen saturation. They are discharging her with 2 liters of oxygen. She will be faxing orders for labs to be done here. Dr. Juliann Mule notified.

## 2013-02-28 ENCOUNTER — Telehealth: Payer: Self-pay | Admitting: Internal Medicine

## 2013-02-28 ENCOUNTER — Other Ambulatory Visit: Payer: Self-pay | Admitting: Medical Oncology

## 2013-02-28 ENCOUNTER — Telehealth: Payer: Self-pay | Admitting: *Deleted

## 2013-02-28 ENCOUNTER — Ambulatory Visit (HOSPITAL_COMMUNITY)
Admission: RE | Admit: 2013-02-28 | Discharge: 2013-02-28 | Disposition: A | Discharge status: Home / Self Care | Payer: PRIVATE HEALTH INSURANCE | Source: Ambulatory Visit | Attending: Internal Medicine | Admitting: Internal Medicine

## 2013-02-28 ENCOUNTER — Other Ambulatory Visit (HOSPITAL_BASED_OUTPATIENT_CLINIC_OR_DEPARTMENT_OTHER): Payer: Medicare Other

## 2013-02-28 DIAGNOSIS — C9000 Multiple myeloma not having achieved remission: Secondary | ICD-10-CM

## 2013-02-28 DIAGNOSIS — C3430 Malignant neoplasm of lower lobe, unspecified bronchus or lung: Secondary | ICD-10-CM

## 2013-02-28 LAB — BASIC METABOLIC PANEL (CC13)
ANION GAP: 12 meq/L — AB (ref 3–11)
BUN: 13.1 mg/dL (ref 7.0–26.0)
CO2: 27 meq/L (ref 22–29)
Calcium: 10.5 mg/dL — ABNORMAL HIGH (ref 8.4–10.4)
Chloride: 104 mEq/L (ref 98–109)
Creatinine: 0.7 mg/dL (ref 0.6–1.1)
Glucose: 101 mg/dl (ref 70–140)
Potassium: 4.3 mEq/L (ref 3.5–5.1)
SODIUM: 142 meq/L (ref 136–145)

## 2013-02-28 LAB — CBC WITH DIFFERENTIAL/PLATELET
BASO%: 0.7 % (ref 0.0–2.0)
Basophils Absolute: 0 10*3/uL (ref 0.0–0.1)
EOS%: 0.1 % (ref 0.0–7.0)
Eosinophils Absolute: 0 10*3/uL (ref 0.0–0.5)
HCT: 32.5 % — ABNORMAL LOW (ref 34.8–46.6)
HGB: 10.6 g/dL — ABNORMAL LOW (ref 11.6–15.9)
LYMPH%: 13.6 % — AB (ref 14.0–49.7)
MCH: 29.5 pg (ref 25.1–34.0)
MCHC: 32.7 g/dL (ref 31.5–36.0)
MCV: 90.3 fL (ref 79.5–101.0)
MONO#: 0.7 10*3/uL (ref 0.1–0.9)
MONO%: 15.6 % — AB (ref 0.0–14.0)
NEUT#: 3.2 10*3/uL (ref 1.5–6.5)
NEUT%: 70 % (ref 38.4–76.8)
PLATELETS: 143 10*3/uL — AB (ref 145–400)
RBC: 3.6 10*6/uL — ABNORMAL LOW (ref 3.70–5.45)
RDW: 17.5 % — ABNORMAL HIGH (ref 11.2–14.5)
WBC: 4.6 10*3/uL (ref 3.9–10.3)
lymph#: 0.6 10*3/uL — ABNORMAL LOW (ref 0.9–3.3)

## 2013-02-28 LAB — HOLD TUBE, BLOOD BANK

## 2013-02-28 LAB — MAGNESIUM (CC13): MAGNESIUM: 1.8 mg/dL (ref 1.5–2.5)

## 2013-02-28 NOTE — Telephone Encounter (Signed)
s.w. pt and advised on all appts....pt comming today and could only do late afternoon

## 2013-02-28 NOTE — Telephone Encounter (Signed)
Per staff message and POF I have scheduled appts. I have scheduled 2/5 appt and advised scheduler to move up the lab appt. The appt for 2/12 is to late in the day for treatment, advised scheduler to move appts  JMW

## 2013-03-01 ENCOUNTER — Telehealth: Payer: Self-pay

## 2013-03-01 NOTE — Telephone Encounter (Signed)
Patient Hgb 10.6.  Per treatment parameters, blood not needed.  Patient notified by phone and voiced understanding.

## 2013-03-03 ENCOUNTER — Ambulatory Visit: Payer: Medicare Other

## 2013-03-03 ENCOUNTER — Other Ambulatory Visit (HOSPITAL_BASED_OUTPATIENT_CLINIC_OR_DEPARTMENT_OTHER): Payer: Medicare Other

## 2013-03-03 DIAGNOSIS — C9000 Multiple myeloma not having achieved remission: Secondary | ICD-10-CM | POA: Diagnosis not present

## 2013-03-03 LAB — BASIC METABOLIC PANEL (CC13)
Anion Gap: 11 mEq/L (ref 3–11)
BUN: 16.9 mg/dL (ref 7.0–26.0)
CALCIUM: 10.6 mg/dL — AB (ref 8.4–10.4)
CO2: 28 meq/L (ref 22–29)
CREATININE: 0.8 mg/dL (ref 0.6–1.1)
Chloride: 105 mEq/L (ref 98–109)
Glucose: 123 mg/dl (ref 70–140)
Potassium: 4.7 mEq/L (ref 3.5–5.1)
Sodium: 144 mEq/L (ref 136–145)

## 2013-03-03 LAB — CBC WITH DIFFERENTIAL/PLATELET
BASO%: 1.1 % (ref 0.0–2.0)
BASOS ABS: 0 10*3/uL (ref 0.0–0.1)
EOS%: 0.5 % (ref 0.0–7.0)
Eosinophils Absolute: 0 10*3/uL (ref 0.0–0.5)
HEMATOCRIT: 32 % — AB (ref 34.8–46.6)
HEMOGLOBIN: 10.7 g/dL — AB (ref 11.6–15.9)
LYMPH%: 17.2 % (ref 14.0–49.7)
MCH: 29.8 pg (ref 25.1–34.0)
MCHC: 33.3 g/dL (ref 31.5–36.0)
MCV: 89.5 fL (ref 79.5–101.0)
MONO#: 0.6 10*3/uL (ref 0.1–0.9)
MONO%: 18.3 % — AB (ref 0.0–14.0)
NEUT#: 2.1 10*3/uL (ref 1.5–6.5)
NEUT%: 62.9 % (ref 38.4–76.8)
Platelets: 169 10*3/uL (ref 145–400)
RBC: 3.58 10*6/uL — ABNORMAL LOW (ref 3.70–5.45)
RDW: 17.8 % — ABNORMAL HIGH (ref 11.2–14.5)
WBC: 3.4 10*3/uL — ABNORMAL LOW (ref 3.9–10.3)
lymph#: 0.6 10*3/uL — ABNORMAL LOW (ref 0.9–3.3)

## 2013-03-03 LAB — IRON AND TIBC CHCC
%SAT: 28 % (ref 21–57)
IRON: 91 ug/dL (ref 41–142)
TIBC: 319 ug/dL (ref 236–444)
UIBC: 228 ug/dL (ref 120–384)

## 2013-03-03 LAB — HOLD TUBE, BLOOD BANK

## 2013-03-03 LAB — FERRITIN CHCC: Ferritin: 364 ng/ml — ABNORMAL HIGH (ref 9–269)

## 2013-03-03 LAB — MAGNESIUM (CC13): MAGNESIUM: 1.8 mg/dL (ref 1.5–2.5)

## 2013-03-03 NOTE — Progress Notes (Signed)
Discussed with Jessica Pulse RN with Dr. Juliann Mule, no blood needed today per [facility] treatment parameters. AVS and labs with next appts printed for patient. Patient notified and voiced understanding.

## 2013-03-08 DIAGNOSIS — E785 Hyperlipidemia, unspecified: Secondary | ICD-10-CM | POA: Diagnosis not present

## 2013-03-08 DIAGNOSIS — Z87891 Personal history of nicotine dependence: Secondary | ICD-10-CM | POA: Diagnosis not present

## 2013-03-08 DIAGNOSIS — C3490 Malignant neoplasm of unspecified part of unspecified bronchus or lung: Secondary | ICD-10-CM | POA: Diagnosis not present

## 2013-03-08 DIAGNOSIS — I10 Essential (primary) hypertension: Secondary | ICD-10-CM | POA: Diagnosis not present

## 2013-03-08 DIAGNOSIS — Z9484 Stem cells transplant status: Secondary | ICD-10-CM | POA: Diagnosis not present

## 2013-03-08 DIAGNOSIS — C9000 Multiple myeloma not having achieved remission: Secondary | ICD-10-CM | POA: Diagnosis not present

## 2013-03-08 DIAGNOSIS — I5030 Unspecified diastolic (congestive) heart failure: Secondary | ICD-10-CM | POA: Diagnosis not present

## 2013-03-08 DIAGNOSIS — E119 Type 2 diabetes mellitus without complications: Secondary | ICD-10-CM | POA: Diagnosis not present

## 2013-03-10 ENCOUNTER — Other Ambulatory Visit: Payer: PRIVATE HEALTH INSURANCE

## 2013-03-10 ENCOUNTER — Ambulatory Visit: Payer: PRIVATE HEALTH INSURANCE

## 2013-03-11 ENCOUNTER — Telehealth: Payer: Self-pay | Admitting: Internal Medicine

## 2013-03-11 ENCOUNTER — Ambulatory Visit (HOSPITAL_BASED_OUTPATIENT_CLINIC_OR_DEPARTMENT_OTHER): Payer: Medicare Other | Admitting: Internal Medicine

## 2013-03-11 ENCOUNTER — Other Ambulatory Visit (HOSPITAL_BASED_OUTPATIENT_CLINIC_OR_DEPARTMENT_OTHER): Payer: Medicare Other

## 2013-03-11 VITALS — BP 142/55 | HR 92 | Temp 97.3°F | Resp 18 | Ht 65.0 in | Wt 176.5 lb

## 2013-03-11 DIAGNOSIS — Z902 Acquired absence of lung [part of]: Secondary | ICD-10-CM

## 2013-03-11 DIAGNOSIS — R634 Abnormal weight loss: Secondary | ICD-10-CM | POA: Diagnosis not present

## 2013-03-11 DIAGNOSIS — C9000 Multiple myeloma not having achieved remission: Secondary | ICD-10-CM

## 2013-03-11 DIAGNOSIS — D649 Anemia, unspecified: Secondary | ICD-10-CM | POA: Diagnosis not present

## 2013-03-11 DIAGNOSIS — C7A090 Malignant carcinoid tumor of the bronchus and lung: Secondary | ICD-10-CM | POA: Diagnosis not present

## 2013-03-11 DIAGNOSIS — C3490 Malignant neoplasm of unspecified part of unspecified bronchus or lung: Secondary | ICD-10-CM

## 2013-03-11 LAB — CBC WITH DIFFERENTIAL/PLATELET
BASO%: 0.8 % (ref 0.0–2.0)
BASOS ABS: 0 10*3/uL (ref 0.0–0.1)
EOS%: 4.4 % (ref 0.0–7.0)
Eosinophils Absolute: 0.2 10*3/uL (ref 0.0–0.5)
HEMATOCRIT: 32.8 % — AB (ref 34.8–46.6)
HEMOGLOBIN: 10.8 g/dL — AB (ref 11.6–15.9)
LYMPH%: 23.2 % (ref 14.0–49.7)
MCH: 29.2 pg (ref 25.1–34.0)
MCHC: 32.9 g/dL (ref 31.5–36.0)
MCV: 88.6 fL (ref 79.5–101.0)
MONO#: 0.5 10*3/uL (ref 0.1–0.9)
MONO%: 12.4 % (ref 0.0–14.0)
NEUT#: 2.3 10*3/uL (ref 1.5–6.5)
NEUT%: 59.2 % (ref 38.4–76.8)
Platelets: 172 10*3/uL (ref 145–400)
RBC: 3.7 10*6/uL (ref 3.70–5.45)
RDW: 16.9 % — ABNORMAL HIGH (ref 11.2–14.5)
WBC: 3.9 10*3/uL (ref 3.9–10.3)
lymph#: 0.9 10*3/uL (ref 0.9–3.3)

## 2013-03-11 LAB — BASIC METABOLIC PANEL (CC13)
ANION GAP: 11 meq/L (ref 3–11)
BUN: 16.6 mg/dL (ref 7.0–26.0)
CALCIUM: 10.1 mg/dL (ref 8.4–10.4)
CO2: 24 mEq/L (ref 22–29)
CREATININE: 0.8 mg/dL (ref 0.6–1.1)
Chloride: 107 mEq/L (ref 98–109)
Glucose: 162 mg/dl — ABNORMAL HIGH (ref 70–140)
Potassium: 4.4 mEq/L (ref 3.5–5.1)
Sodium: 142 mEq/L (ref 136–145)

## 2013-03-11 LAB — HOLD TUBE, BLOOD BANK

## 2013-03-11 LAB — MAGNESIUM (CC13): MAGNESIUM: 1.6 mg/dL (ref 1.5–2.5)

## 2013-03-11 NOTE — Telephone Encounter (Signed)
Talked to to pt and gave her appt for lab and MD on march 20

## 2013-03-11 NOTE — Telephone Encounter (Signed)
In error

## 2013-03-11 NOTE — Patient Instructions (Signed)
Hypomagnesemia Magnesium is a common ion (mineral) in the body which is needed for metabolism. It is about how the body handles food and other chemical reactions necessary for life. Only about 2% of the magnesium in our body is found in the blood. When this is low, it is called hypomagnesemia. The blood will measure only a tiny amount of the magnesium in our body. When it is low in our blood, it does not mean that the whole body supply is low. The normal serum concentration ranges from 1.8-2.5 mEq/L. When the level gets to be less than 1.0 mEq/L, a number of problems begin to happen.  CAUSES   Receiving intravenous fluids without magnesium replacement.  Loss of magnesium from the bowel by naso-gastric suction.  Loss of magnesium from nausea and vomiting or severe diarrhea. Any of the inflammatory bowel conditions can cause this.  Abuse of alcohol often leads to low serum magnesium.  An inherited form of magnesium loss happens when the kidneys lose magnesium. This is called familial or primary hypomagnesemia.  Some medications such as diuretics also cause the loss of magnesium. SYMPTOMS  These following problems are worse if the changes in magnesium levels come on suddenly.  Tremor.  Confusion.  Muscle weakness.  Over-sensitive to sights and sounds.  Sensitive reflexes.  Depression.  Muscular fibrillations.  Over-reactivity of the nerves.  Irritability.  Psychosis.  Spasms of the hand muscles.  Tetany (where the muscles go into uncontrollable spasms). DIAGNOSIS  This condition can be diagnosed by blood tests. TREATMENT   In emergency, magnesium can be given intravenously (by vein).  If the condition is less worrisome, it can be corrected by diet. High levels of magnesium are found in green leafy vegetables, peas, beans and nuts among other things. It can also be given through medications by mouth.  If it is being caused by medications, changes can be made.  If  alcohol is a problem, help is available if there are difficulties giving it up. Document Released: 10/09/2004 Document Revised: 04/07/2011 Document Reviewed: 09/03/2007 [facility] Patient Information 2014 [place]. Hypokalemia Hypokalemia means that the amount of potassium in the blood is lower than normal.Potassium is a chemical, called an electrolyte, that helps regulate the amount of fluid in the body. It also stimulates muscle contraction and helps nerves function properly.Most of the body's potassium is inside of cells, and only a very small amount is in the blood. Because the amount in the blood is so small, minor changes can be life-threatening. CAUSES  Antibiotics.  Diarrhea or vomiting.  Using laxatives too much, which can cause diarrhea.  Chronic kidney disease.  Water pills (diuretics).  Eating disorders (bulimia).  Low magnesium level.  Sweating a lot. SIGNS AND SYMPTOMS  Weakness.  Constipation.  Fatigue.  Muscle cramps.  Mental confusion.  Skipped heartbeats or irregular heartbeat (palpitations).  Tingling or numbness. DIAGNOSIS  Your health care provider can diagnose hypokalemia with blood tests. In addition to checking your potassium level, your health care provider may also check other lab tests. TREATMENT Hypokalemia can be treated with potassium supplements taken by mouth or adjustments in your current medicines. If your potassium level is very low, you may need to get potassium through a vein (IV) and be monitored in the hospital. A diet high in potassium is also helpful. Foods high in potassium are:  Nuts, such as peanuts and pistachios.  Seeds, such as sunflower seeds and pumpkin seeds.  Peas, lentils, and lima beans.  Whole grain and  bran cereals and breads.  Fresh fruit and vegetables, such as apricots, avocado, bananas, cantaloupe, kiwi, oranges, tomatoes, asparagus, and potatoes.  Orange and tomato juices.  Red meats.  Fruit  yogurt. HOME CARE INSTRUCTIONS  Take all medicines as prescribed by your health care provider.  Maintain a healthy diet by including nutritious food, such as fruits, vegetables, nuts, whole grains, and lean meats.  If you are taking a laxative, be sure to follow the directions on the label. SEEK MEDICAL CARE IF:  Your weakness gets worse.  You feel your heart pounding or racing.  You are vomiting or having diarrhea.  You are diabetic and having trouble keeping your blood glucose in the normal range. SEEK IMMEDIATE MEDICAL CARE IF:  You have chest pain, shortness of breath, or dizziness.  You are vomiting or having diarrhea for more than 2 days.  You faint. MAKE SURE YOU:   Understand these instructions.  Will watch your condition.  Will get help right away if you are not doing well or get worse. Document Released: 01/13/2005 Document Revised: 11/03/2012 Document Reviewed: 07/16/2012 [facility] Patient Information 2014 [place].

## 2013-03-13 DIAGNOSIS — Z902 Acquired absence of lung [part of]: Secondary | ICD-10-CM | POA: Insufficient documentation

## 2013-03-13 NOTE — Progress Notes (Signed)
[facility] OFFICE PROGRESS NOTE  [name], MD [place] [address]  DIAGNOSIS: Multiple myeloma, without mention of having achieved remission(203.00) - Plan: CBC with Differential, Basic metabolic panel (Bmet) - [facility], Magnesium - [facility]  Non-small cell lung cancer  Status post lobectomy of lung  Chief Complaint  Patient presents with  . Multiple myeloma, without mention of having achieved remissi      Multiple myeloma, without mention of having achieved remission(203.00)   05/27/2012 Initial Diagnosis Multiple myeloma, without mention of having achieved remission(203.00)   05/27/2012 Cancer Staging 1.  IgG kappa multiple myeloma. She presented in 05/2012 with anemia, Cr 0.8, Ca 9.6; no bone lytic lesion; LDH 140; beta2- microglobulin 1.86 (ref 1.01-1.73). Positive SPEP for M-spike at 1.69, IgG 2080, kappa 7.24, kappa:lambda ratio 24.97; UPEP positive   06/08/2012 Bone Marrow Biopsy Bone marrow biopsy on 06/08/12 showed 19% plasma cell; normal cytogenetics; myeloma FISH pending.    06/14/2012 Imaging Skeletal survey negative for bone lytic lesions.  Lung nodule apparent.   09/17/2012 Bone Marrow Biopsy Bone marrow biopsy showed 11% plasma cells.   10/18/2012 - 12/31/2012 Chemotherapy Started on VRD (Velcade 1.3 mg/m^2 [place] days 1,8 and 15; Revlimide 25 mg PO daily, on day 1 through 14; Decadron 40 mg PO Days 1, 8, 15).   Started on coumadin given RD.   01/14/2013 Bone Marrow Biopsy The bone marrow clot sections are normocellular toslightly hypercellular for age (approximately 40% overall). 1% plasma cells.   02/08/2013 Bone Marrow Transplant Auto Transplant at [facility] (Dr. Glorianne Manchester McIver).    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 Lamonte Sakai)   07/08/2012 Pathology Pathology consistent with Non-small cell lung cancer of RLL.    07/14/2012 Imaging 1. Right lower lobe nodule is non hypermetabolic which suggests benign etiology considering the slow growing rate in 6 years or non FDG avid disease as seen with carcinoid tumors. Pending biopsy. No PET evidence of mediastinal nodal disease.    08/02/2012 Imaging MRI of brain negative.   08/12/2012 Surgery Robotic Lobectomy of R Lower lobe.    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     Surgical resection margins are negative.     Two benign lymph nodes.     pTa1N0    INTERVAL HISTORY: Len Childs 68 y.o. female with a history of   MEDICAL HISTORY: Past Medical History  Diagnosis Date  . Osteoporosis   . Hypercholesterolemia   . Esophageal reflux disease   . Hypertension   . Diabetes type 2, controlled   . Pancytopenia   . Ischemic colitis 2009  . Lung nodule 06/11/2012  . Depression   . Sleep apnea     UPPER AIRWAY RESISTANCE  DOES NOT HAVE CPAP , MAY NEED   . Tuberculosis      (+ TB TEST)YEARS AGO POSSIBLE EXPOSURE WAS MED TX   . Multiple myeloma 05/2012    Cytogenetics on 06/08/2012 was normal 20, XX [20]  . Liver enzyme elevation     INTERIM HISTORY: has HYPERLIPIDEMIA; HYPERTENSION; PULMONARY NODULE, LEFT UPPER LOBE; G E R D; OSTEOPOROSIS; Nonspecific (abnormal) findings on radiological and other examination of body structure; ABNORMAL RADIOLOGIAL EXAMINATION; Monoclonal gammopathy; Lung nodule; Multiple myeloma, without mention of having achieved remission(203.00); Obesity; Malignant carcinoid tumor of the  bronchus and lung; and Status post lobectomy of lung on her problem list.    ALLERGIES:  has No Known Allergies.  MEDICATIONS: has a current medication list which includes the following prescription(s): acyclovir, bupropion, clonazepam, esomeprazole, folic acid, furosemide, loperamide, losartan, magnesium chloride, metoprolol succinate, multivitamin with minerals, ondansetron,  potassium chloride, prochlorperazine, sertraline, simvastatin, sulfamethoxazole-trimethoprim, and metformin.  SURGICAL HISTORY:  Past Surgical History  Procedure Laterality Date  . Colonoscopy  2013    poly; Dr. Oletta Lamas, next one due 2018.l  . Appendectomy    . Tonsillectomy    . Leg surgery    . Video bronchoscopy with endobronchial navigation Right 07/08/2012    Procedure: VIDEO BRONCHOSCOPY WITH ENDOBRONCHIAL NAVIGATION;  Surgeon: Collene Gobble, MD;  Location: [facility];  Service: Thoracic;  Laterality: Right;    REVIEW OF SYSTEMS:   Constitutional: Denies fevers, chills or abnormal weight loss; She reports continued fatigue.  Eyes: Denies blurriness of vision Ears, nose, mouth, throat, and face: Denies mucositis or sore throat Respiratory: Denies cough, dyspnea or wheezes Cardiovascular: Denies palpitation, chest discomfort or lower extremity swelling Gastrointestinal:  Denies nausea, heartburn or change in bowel habits Skin: Denies abnormal skin rashes Lymphatics: Denies new lymphadenopathy or easy bruising Neurological:Denies numbness, tingling or new weaknesses Behavioral/Psych: Mood is stable, no new changes  All other systems were reviewed with the patient and are negative.  PHYSICAL EXAMINATION: ECOG PERFORMANCE STATUS: 0 - Asymptomatic  Blood pressure 142/55, pulse 92, temperature 97.3 F (36.3 C), temperature source Oral, resp. rate 18, height '5\' 5"'  (1.651 m), weight 176 lb 8 oz (80.06 kg), SpO2 100.00%.  GENERAL:alert, no distress and comfortable; alopecia, chronically ill appearing.   SKIN: skin color, texture, turgor are normal, no rashes or significant lesions EYES: normal, Conjunctiva are pink and non-injected, sclera clear OROPHARYNX:no exudate, no erythema and lips, buccal mucosa, and tongue normal  NECK: supple, thyroid normal size, non-tender, without nodularity LYMPH:  no palpable lymphadenopathy in the cervical, axillary or supraclavicular LUNGS: clear to  auscultation with normal breathing effort, no wheezes or rhonchi HEART: regular rate & rhythm and no murmurs and no lower extremity edema ABDOMEN:abdomen soft, non-tender and normal bowel sounds Musculoskeletal:no cyanosis of digits and no clubbing  NEURO: alert & oriented x 3 with fluent speech, no focal motor/sensory deficits  Labs:  Lab Results  Component Value Date   WBC 3.9 03/11/2013   HGB 10.8* 03/11/2013   HCT 32.8* 03/11/2013   MCV 88.6 03/11/2013   PLT 172 03/11/2013   NEUTROABS 2.3 03/11/2013      Chemistry      Component Value Date/Time   NA 142 03/11/2013 1050   NA 139 07/07/2012 1353   K 4.4 03/11/2013 1050   K 4.2 07/07/2012 1353   CL 103 07/07/2012 1353   CL 105 06/08/2012 0913   CO2 24 03/11/2013 1050   CO2 27 07/07/2012 1353   BUN 16.6 03/11/2013 1050   BUN 17 07/07/2012 1353   CREATININE 0.8 03/11/2013 1050   CREATININE 0.65 07/07/2012 1353      Component Value Date/Time   CALCIUM 10.1 03/11/2013 1050   CALCIUM 10.1 07/07/2012 1353   ALKPHOS 102 01/06/2013 1442   ALKPHOS 94 07/07/2012 1353   AST 9 01/06/2013 1442   AST 38* 07/07/2012 1353   ALT 14 01/06/2013 1442   ALT 43* 07/07/2012 1353   BILITOT 0.36 01/06/2013 1442   BILITOT 0.4 07/07/2012 1353       Basic Metabolic Panel:  Recent Labs Lab 03/11/13  1050 03/11/13 1050  NA  --  142  K  --  4.4  CO2  --  24  GLUCOSE  --  162*  BUN  --  16.6  CREATININE  --  0.8  CALCIUM  --  10.1  MG 1.6  --    GFR Estimated Creatinine Clearance: 72.3 ml/min (by C-G formula based on Cr of 0.8).  CBC:  Recent Labs Lab 03/11/13 1049  WBC 3.9  NEUTROABS 2.3  HGB 10.8*  HCT 32.8*  MCV 88.6  PLT 172   Studies:  No results found.   RADIOGRAPHIC STUDIES: No results found.  ASSESSMENT: Len Childs 68 y.o. female with a history of Multiple myeloma, without mention of having achieved remission(203.00) - Plan: CBC with Differential, Basic metabolic panel (Bmet) - [facility], Magnesium - [facility]  Non-small cell lung  cancer  Status post lobectomy of lung   PLAN:  1. IgG Kappa MM s/p autologous SCT (02/08/2013).   -- She completed 4 cycles of VRd and had SCT at [facility].   She is continuing to recover her counts and her energy is improving.  We followed her counts and her WBC and Plts are recovered.   -- She will continue acyclovir and bactrim per protocol. --We will implement vaccinations per protocol. --Consideration for maintenance therapy with velcade or revlimid in 3 months following transplant.   2. 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 --1.7cm on final pathology from [facility].  0/8 lymph nodes were negative. Normal margins. Grade G1.   3. Anemia secondary to #1.  --She is asymptomatic.   4. Weight lost. --Patient has lost nearly 20 lbs post transplant.  We will continue boost/ensure with meals and monitor closely.   5. Follow-up.  She will have repeat counts and 2 weeks with a close follow in one month.   All questions were answered. The patient knows to call the clinic with any problems, questions or concerns. We can certainly see the patient much sooner if necessary.  I spent 15 minutes counseling the patient face to face. The total time spent in the appointment was 25 minutes.    Tecla Mailloux, MD 03/13/2013 4:56 PM

## 2013-03-28 ENCOUNTER — Telehealth: Payer: Self-pay | Admitting: Internal Medicine

## 2013-03-28 ENCOUNTER — Other Ambulatory Visit (HOSPITAL_BASED_OUTPATIENT_CLINIC_OR_DEPARTMENT_OTHER): Payer: Medicare Other

## 2013-03-28 ENCOUNTER — Ambulatory Visit (HOSPITAL_BASED_OUTPATIENT_CLINIC_OR_DEPARTMENT_OTHER): Payer: Medicare Other | Admitting: Internal Medicine

## 2013-03-28 VITALS — BP 138/65 | HR 85 | Temp 97.9°F | Resp 18 | Ht 65.0 in | Wt 179.6 lb

## 2013-03-28 DIAGNOSIS — D630 Anemia in neoplastic disease: Secondary | ICD-10-CM

## 2013-03-28 DIAGNOSIS — C7A090 Malignant carcinoid tumor of the bronchus and lung: Secondary | ICD-10-CM | POA: Diagnosis not present

## 2013-03-28 DIAGNOSIS — C9000 Multiple myeloma not having achieved remission: Secondary | ICD-10-CM | POA: Diagnosis not present

## 2013-03-28 DIAGNOSIS — R634 Abnormal weight loss: Secondary | ICD-10-CM

## 2013-03-28 DIAGNOSIS — Z902 Acquired absence of lung [part of]: Secondary | ICD-10-CM

## 2013-03-28 LAB — CBC WITH DIFFERENTIAL/PLATELET
BASO%: 0.4 % (ref 0.0–2.0)
BASOS ABS: 0 10*3/uL (ref 0.0–0.1)
EOS%: 2.4 % (ref 0.0–7.0)
Eosinophils Absolute: 0.1 10*3/uL (ref 0.0–0.5)
HCT: 33.3 % — ABNORMAL LOW (ref 34.8–46.6)
HEMOGLOBIN: 10.8 g/dL — AB (ref 11.6–15.9)
LYMPH%: 20.5 % (ref 14.0–49.7)
MCH: 30.5 pg (ref 25.1–34.0)
MCHC: 32.6 g/dL (ref 31.5–36.0)
MCV: 93.6 fL (ref 79.5–101.0)
MONO#: 0.4 10*3/uL (ref 0.1–0.9)
MONO%: 10.9 % (ref 0.0–14.0)
NEUT%: 65.8 % (ref 38.4–76.8)
NEUTROS ABS: 2.5 10*3/uL (ref 1.5–6.5)
Platelets: 161 10*3/uL (ref 145–400)
RBC: 3.55 10*6/uL — ABNORMAL LOW (ref 3.70–5.45)
RDW: 20.2 % — AB (ref 11.2–14.5)
WBC: 3.9 10*3/uL (ref 3.9–10.3)
lymph#: 0.8 10*3/uL — ABNORMAL LOW (ref 0.9–3.3)

## 2013-03-28 LAB — BASIC METABOLIC PANEL (CC13)
Anion Gap: 6 mEq/L (ref 3–11)
BUN: 17.6 mg/dL (ref 7.0–26.0)
CHLORIDE: 108 meq/L (ref 98–109)
CO2: 26 meq/L (ref 22–29)
Calcium: 9.8 mg/dL (ref 8.4–10.4)
Creatinine: 1.1 mg/dL (ref 0.6–1.1)
GLUCOSE: 137 mg/dL (ref 70–140)
POTASSIUM: 4.9 meq/L (ref 3.5–5.1)
SODIUM: 140 meq/L (ref 136–145)

## 2013-03-28 LAB — MAGNESIUM (CC13): Magnesium: 1.7 mg/dl (ref 1.5–2.5)

## 2013-03-28 NOTE — Telephone Encounter (Signed)
gv pt appt schedule for march. per Dr. Juliann Mule due to he is out wk of 3/30 pt will have lb/fu 3/27.

## 2013-03-29 NOTE — Progress Notes (Signed)
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 Multiple myeloma, without mention of having achieved remissi    CURRENT TREATMENT: Observation.    Multiple myeloma, without mention of having achieved remission(203.00)   05/27/2012 Initial Diagnosis Multiple myeloma, without mention of having achieved remission(203.00)   05/27/2012 Cancer Staging 1.  IgG kappa multiple myeloma. She presented in 05/2012 with anemia, Cr 0.8, Ca 9.6; no bone lytic lesion; LDH 140; beta2- microglobulin 1.86 (ref 1.01-1.73). Positive SPEP for M-spike at 1.69, IgG 2080, kappa 7.24, kappa:lambda ratio 24.97; UPEP positive   06/08/2012 Bone Marrow Biopsy Bone marrow biopsy on 06/08/12 showed 19% plasma cell; normal cytogenetics; myeloma FISH pending.    06/14/2012 Imaging Skeletal survey negative for bone lytic lesions.  Lung nodule apparent.   09/17/2012 Bone Marrow Biopsy Bone marrow biopsy showed 11% plasma cells.   10/18/2012 - 12/31/2012 Chemotherapy Started on VRD (Velcade 1.3 mg/m^2 [place] days 1,8 and 15; Revlimide 25 mg PO daily, on day 1 through 14; Decadron 40 mg PO Days 1, 8, 15).   Started on coumadin given RD.   01/14/2013 Bone Marrow Biopsy The bone marrow clot sections are normocellular toslightly hypercellular for age (approximately 40% overall). 1% plasma cells.   02/08/2013 Bone Marrow Transplant Auto Transplant at [facility] (Dr. Glorianne Manchester McIver).    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 Lamonte Sakai)    07/08/2012 Pathology Pathology consistent with Non-small cell lung cancer of RLL.    07/14/2012 Imaging 1. Right lower lobe nodule is non hypermetabolic which suggests benign etiology considering the slow growing rate in 6 years or non FDG avid disease as seen with carcinoid tumors. Pending biopsy. No PET evidence of mediastinal nodal disease.    08/02/2012 Imaging MRI of brain negative.   08/12/2012 Surgery Robotic Lobectomy of R Lower lobe.    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     Surgical resection margins are negative.     Two benign lymph nodes.     pTa1N0     INTERVAL HISTORY: [name] 68 y.o. female returns for her follow-up. She was last seen on 03/11/2013.  She is status post SCT on 02/08/2013 as noted above.  She reports a 3-5 lbs weight gain.  Her energy is slowly improving.  She reports doing well overall. She is independent of all activities of daily living.   Patient denies anorexia, headache, visual changes, confusion, drenching night sweats, palpable lymph node swelling, mucositis, odynophagia, dysphagia, jaundice, chest pain, palpitation, occasional shortness of breath, dyspnea on exertion, productive cough, gum bleeding, epistaxis, hematemesis, hemoptysis, abdominal pain, abdominal swelling, early satiety, melena, hematochezia, hematuria, skin rash, spontaneous bleeding, joint swelling, joint pain, heat or cold intolerance, bowel bladder incontinence, back pain, focal motor weakness, paresthesia.    MEDICAL HISTORY: Past Medical History  Diagnosis Date  . Osteoporosis   . Hypercholesterolemia   . Esophageal reflux disease   . Hypertension   . Diabetes type 2, controlled   .  Pancytopenia   . Ischemic colitis 2009  . Lung nodule 06/11/2012  . Depression   . Sleep apnea     UPPER AIRWAY RESISTANCE  DOES NOT HAVE CPAP , MAY NEED   . Tuberculosis      (+ TB TEST)YEARS AGO POSSIBLE EXPOSURE  WAS MED TX   . Multiple myeloma 05/2012    Cytogenetics on 06/08/2012 was normal 16, XX [20]  . 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    ALLERGIES:  has No Known Allergies.  MEDICATIONS: has a current medication list which includes the following prescription(s): acyclovir, bupropion, clonazepam, esomeprazole, folic acid, furosemide, loperamide, losartan, magnesium chloride, metformin, metoprolol succinate, multivitamin with minerals, ondansetron, potassium chloride, prochlorperazine, sertraline, simvastatin, and sulfamethoxazole-trimethoprim.  SURGICAL HISTORY:  Past Surgical History  Procedure Laterality Date  . Colonoscopy  2013    poly; Dr. Oletta Lamas, next one due 2018.l  . Appendectomy    . Tonsillectomy    . Leg surgery    . Video bronchoscopy with endobronchial navigation Right 07/08/2012    Procedure: VIDEO BRONCHOSCOPY WITH ENDOBRONCHIAL NAVIGATION;  Surgeon: Collene Gobble, MD;  Location: [facility];  Service: Thoracic;  Laterality: Right;    REVIEW OF SYSTEMS:   Constitutional: Denies fevers, chills or abnormal weight loss Eyes: Denies blurriness of vision Ears, nose, mouth, throat, and face: Denies mucositis or sore throat Respiratory: Denies cough, dyspnea or wheezes Cardiovascular: Denies palpitation, chest discomfort or lower extremity swelling Gastrointestinal:  Denies nausea, heartburn or change in bowel habits Skin: Denies abnormal skin rashes Lymphatics: Denies new lymphadenopathy or easy bruising Neurological:Denies numbness, tingling or  new weaknesses Behavioral/Psych: Mood is stable, no new changes  All other systems were reviewed with the patient and are negative.  PHYSICAL EXAMINATION: ECOG PERFORMANCE STATUS: 0 - Asymptomatic  Blood pressure 138/65, pulse 85, temperature 97.9 F (36.6 C), temperature source Oral, resp. rate 18, height '5\' 5"'  (1.651 m), weight 179 lb 9.6 oz (81.466 kg), SpO2 99.00%.  GENERAL:alert, no distress and comfortable; well developed and well nourished.  Alopecia.  SKIN: skin color, texture, turgor are normal, no rashes or significant lesions EYES: normal, Conjunctiva are pink and non-injected, sclera clear OROPHARYNX:no exudate, no erythema and lips, buccal mucosa, and tongue normal  NECK: supple, thyroid normal size, non-tender, without nodularity LYMPH:  no palpable lymphadenopathy in the cervical, axillary or supraclavicular LUNGS: clear to auscultation with normal breathing effort, no wheezes or rhonchi HEART: regular rate & rhythm and no murmurs and no lower extremity edema ABDOMEN:abdomen soft, non-tender and normal bowel sounds Musculoskeletal:no cyanosis of digits and no clubbing  NEURO: alert & oriented x 3 with fluent speech, no focal motor/sensory deficits  Labs:  Lab Results  Component Value Date   WBC 3.9 03/28/2013   HGB 10.8* 03/28/2013   HCT 33.3* 03/28/2013   MCV 93.6 03/28/2013   PLT 161 03/28/2013   NEUTROABS 2.5 03/28/2013      Chemistry      Component Value Date/Time   NA 140 03/28/2013 1501   NA 139 07/07/2012 1353   K 4.9 03/28/2013 1501   K 4.2 07/07/2012 1353   CL 103 07/07/2012 1353  CL 105 06/08/2012 0913   CO2 26 03/28/2013 1501   CO2 27 07/07/2012 1353   BUN 17.6 03/28/2013 1501   BUN 17 07/07/2012 1353   CREATININE 1.1 03/28/2013 1501   CREATININE 0.65 07/07/2012 1353      Component Value Date/Time   CALCIUM 9.8 03/28/2013 1501   CALCIUM 10.1 07/07/2012 1353   ALKPHOS 102 01/06/2013 1442   ALKPHOS 94 07/07/2012 1353   AST 9 01/06/2013 1442   AST 38* 07/07/2012 1353    ALT 14 01/06/2013 1442   ALT 43* 07/07/2012 1353   BILITOT 0.36 01/06/2013 1442   BILITOT 0.4 07/07/2012 1353       Basic Metabolic Panel:  Recent Labs Lab 03/28/13 1501  NA 140  K 4.9  CO2 26  GLUCOSE 137  BUN 17.6  CREATININE 1.1  CALCIUM 9.8  MG 1.7   CBC:  Recent Labs Lab 03/28/13 1501  WBC 3.9  NEUTROABS 2.5  HGB 10.8*  HCT 33.3*  MCV 93.6  PLT 161   Studies:  No results found.   RADIOGRAPHIC STUDIES: No results found.  ASSESSMENT: [name] 68 y.o. 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 IgG Kappa MM s/p autologous SCT (02/08/2013).  -- She completed 4 cycles of VRd and had SCT at [facility]. She is continuing to recover her counts and her energy is improving. We followed her counts and her WBC and Plts are recovered.  -- She will continue acyclovir and bactrim per protocol.  --We will implement vaccinations per protocol.  --Consideration for maintenance therapy with velcade or revlimid in 3 months following transplant.   2. 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  --1.7[facility] 0/8 lymph nodes were negative. Normal margins. Grade G1.   3. Anemia secondary to #1.  --She is asymptomatic.   4. Weight lost.  --Patient has lost nearly 20 lbs post transplant. We will continue boost/ensure with meals and monitor closely. She reports a 3-5 lb weight gain.   5. Follow-up. She will have repeat counts a close follow in one month.  All questions were answered. The patient knows to call the clinic with any problems, questions or concerns. We can certainly see the patient much sooner if necessary.  I spent 10 minutes  counseling the patient face to face. The total time spent in the appointment was 15 minutes.    Bohden Dung, MD 03/29/2013 5:11 PM

## 2013-04-07 ENCOUNTER — Telehealth: Payer: Self-pay | Admitting: *Deleted

## 2013-04-07 NOTE — Telephone Encounter (Signed)
Facsimile received ffrom [employer] requesting discontinuation reason for Revlimid therapy.  Returned fax ro [facility] indicating she is s/p Stem Cell Transplant on 02-08-2013 is why therapy discontinued.  Three months after SCT may receive Velcade or Revlimid for maintenance therapy per provider.

## 2013-04-11 ENCOUNTER — Other Ambulatory Visit (HOSPITAL_BASED_OUTPATIENT_CLINIC_OR_DEPARTMENT_OTHER): Payer: Medicare Other

## 2013-04-11 DIAGNOSIS — C9000 Multiple myeloma not having achieved remission: Secondary | ICD-10-CM

## 2013-04-11 LAB — BASIC METABOLIC PANEL (CC13)
ANION GAP: 10 meq/L (ref 3–11)
BUN: 16.4 mg/dL (ref 7.0–26.0)
CHLORIDE: 108 meq/L (ref 98–109)
CO2: 23 meq/L (ref 22–29)
Calcium: 9.8 mg/dL (ref 8.4–10.4)
Creatinine: 0.8 mg/dL (ref 0.6–1.1)
Glucose: 145 mg/dl — ABNORMAL HIGH (ref 70–140)
Potassium: 4.2 mEq/L (ref 3.5–5.1)
SODIUM: 141 meq/L (ref 136–145)

## 2013-04-12 DIAGNOSIS — G4733 Obstructive sleep apnea (adult) (pediatric): Secondary | ICD-10-CM | POA: Diagnosis not present

## 2013-04-12 DIAGNOSIS — E1129 Type 2 diabetes mellitus with other diabetic kidney complication: Secondary | ICD-10-CM | POA: Diagnosis not present

## 2013-04-13 DIAGNOSIS — H2510 Age-related nuclear cataract, unspecified eye: Secondary | ICD-10-CM | POA: Diagnosis not present

## 2013-04-13 DIAGNOSIS — H04129 Dry eye syndrome of unspecified lacrimal gland: Secondary | ICD-10-CM | POA: Diagnosis not present

## 2013-04-13 DIAGNOSIS — H353190 Nonexudative age-related macular degeneration, unspecified eye, stage unspecified: Secondary | ICD-10-CM | POA: Diagnosis not present

## 2013-04-22 ENCOUNTER — Telehealth: Payer: Self-pay | Admitting: Internal Medicine

## 2013-04-22 ENCOUNTER — Ambulatory Visit (HOSPITAL_BASED_OUTPATIENT_CLINIC_OR_DEPARTMENT_OTHER): Payer: Medicare Other | Admitting: Internal Medicine

## 2013-04-22 ENCOUNTER — Other Ambulatory Visit (HOSPITAL_BASED_OUTPATIENT_CLINIC_OR_DEPARTMENT_OTHER): Payer: Medicare Other

## 2013-04-22 VITALS — BP 143/61 | HR 81 | Temp 97.3°F | Resp 18 | Ht 65.0 in | Wt 187.3 lb

## 2013-04-22 DIAGNOSIS — C7A090 Malignant carcinoid tumor of the bronchus and lung: Secondary | ICD-10-CM

## 2013-04-22 DIAGNOSIS — Z902 Acquired absence of lung [part of]: Secondary | ICD-10-CM

## 2013-04-22 DIAGNOSIS — Z949 Transplanted organ and tissue status, unspecified: Secondary | ICD-10-CM

## 2013-04-22 DIAGNOSIS — C9000 Multiple myeloma not having achieved remission: Secondary | ICD-10-CM | POA: Diagnosis not present

## 2013-04-22 DIAGNOSIS — D630 Anemia in neoplastic disease: Secondary | ICD-10-CM | POA: Diagnosis not present

## 2013-04-22 LAB — CBC WITH DIFFERENTIAL/PLATELET
BASO%: 0.3 % (ref 0.0–2.0)
BASOS ABS: 0 10*3/uL (ref 0.0–0.1)
EOS%: 1.6 % (ref 0.0–7.0)
Eosinophils Absolute: 0.1 10*3/uL (ref 0.0–0.5)
HCT: 31.2 % — ABNORMAL LOW (ref 34.8–46.6)
HEMOGLOBIN: 10.3 g/dL — AB (ref 11.6–15.9)
LYMPH#: 0.8 10*3/uL — AB (ref 0.9–3.3)
LYMPH%: 25.3 % (ref 14.0–49.7)
MCH: 31.5 pg (ref 25.1–34.0)
MCHC: 32.9 g/dL (ref 31.5–36.0)
MCV: 95.7 fL (ref 79.5–101.0)
MONO#: 0.3 10*3/uL (ref 0.1–0.9)
MONO%: 10 % (ref 0.0–14.0)
NEUT#: 2 10*3/uL (ref 1.5–6.5)
NEUT%: 62.8 % (ref 38.4–76.8)
PLATELETS: 153 10*3/uL (ref 145–400)
RBC: 3.26 10*6/uL — ABNORMAL LOW (ref 3.70–5.45)
RDW: 18.9 % — ABNORMAL HIGH (ref 11.2–14.5)
WBC: 3.2 10*3/uL — ABNORMAL LOW (ref 3.9–10.3)

## 2013-04-22 LAB — COMPREHENSIVE METABOLIC PANEL (CC13)
ALT: 28 U/L (ref 0–55)
ANION GAP: 10 meq/L (ref 3–11)
AST: 22 U/L (ref 5–34)
Albumin: 3.6 g/dL (ref 3.5–5.0)
Alkaline Phosphatase: 98 U/L (ref 40–150)
BILIRUBIN TOTAL: 0.33 mg/dL (ref 0.20–1.20)
BUN: 19 mg/dL (ref 7.0–26.0)
CALCIUM: 9.6 mg/dL (ref 8.4–10.4)
CO2: 22 mEq/L (ref 22–29)
CREATININE: 0.7 mg/dL (ref 0.6–1.1)
Chloride: 109 mEq/L (ref 98–109)
Glucose: 135 mg/dl (ref 70–140)
Potassium: 4.4 mEq/L (ref 3.5–5.1)
Sodium: 141 mEq/L (ref 136–145)
Total Protein: 6.3 g/dL — ABNORMAL LOW (ref 6.4–8.3)

## 2013-04-22 NOTE — Telephone Encounter (Signed)
gv and printed appt sched and avs for pt for April  °

## 2013-04-23 NOTE — Progress Notes (Signed)
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 Multiple myeloma, without mention of having achieved remissi    CURRENT TREATMENT: Observation.    Multiple myeloma, without mention of having achieved remission(203.00)   05/27/2012 Initial Diagnosis Multiple myeloma, without mention of having achieved remission(203.00)   05/27/2012 Cancer Staging 1.  IgG kappa multiple myeloma. She presented in 05/2012 with anemia, Cr 0.8, Ca 9.6; no bone lytic lesion; LDH 140; beta2- microglobulin 1.86 (ref 1.01-1.73). Positive SPEP for M-spike at 1.69, IgG 2080, kappa 7.24, kappa:lambda ratio 24.97; UPEP positive   06/08/2012 Bone Marrow Biopsy Bone marrow biopsy on 06/08/12 showed 19% plasma cell; normal cytogenetics; myeloma FISH pending.    06/14/2012 Imaging Skeletal survey negative for bone lytic lesions.  Lung nodule apparent.   09/17/2012 Bone Marrow Biopsy Bone marrow biopsy showed 11% plasma cells.   10/18/2012 - 12/31/2012 Chemotherapy Started on VRD (Velcade 1.3 mg/m^2 [place] days 1,8 and 15; Revlimide 25 mg PO daily, on day 1 through 14; Decadron 40 mg PO Days 1, 8, 15).   Started on coumadin given RD.   01/14/2013 Bone Marrow Biopsy The bone marrow clot sections are normocellular toslightly hypercellular for age (approximately 40% overall). 1% plasma cells.   02/08/2013 Bone Marrow Transplant Auto Transplant at [facility] (Dr. Glorianne Manchester McIver).    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 Lamonte Sakai)   07/08/2012 Pathology Pathology consistent with Non-small cell lung cancer of RLL.    07/14/2012 Imaging 1. Right lower lobe nodule is non hypermetabolic which suggests benign etiology considering the slow growing rate in 6 years or non FDG avid disease as seen with carcinoid tumors. Pending biopsy. No PET evidence of mediastinal nodal disease.    08/02/2012 Imaging MRI of brain negative.   08/12/2012 Surgery Robotic Lobectomy of R Lower lobe.    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     Surgical resection margins are negative.     Two benign lymph nodes.     pTa1N0     INTERVAL HISTORY: [name] 68 y.o. female returns for her follow-up. She was last seen on 03/28/2013.  She is status post SCT on 02/08/2013 as noted above.  She reports a 7 lbs weight gain.  Her energy is slowly improving.  She reports doing well overall. She is independent of all activities of daily living. She is scheduled to see Dr. Harvel Ricks next month.    Patient denies anorexia, headache, visual changes, confusion, drenching night sweats, palpable lymph node swelling, mucositis, odynophagia, dysphagia, jaundice, chest pain, palpitation, occasional shortness of breath, dyspnea on exertion, productive cough, gum bleeding, epistaxis, hematemesis, hemoptysis, abdominal pain, abdominal swelling, early satiety, melena, hematochezia, hematuria, skin rash, spontaneous bleeding, joint swelling, joint pain, heat or cold intolerance, bowel bladder incontinence, back pain, focal motor weakness, paresthesia.    MEDICAL HISTORY: Past Medical History  Diagnosis Date  . Osteoporosis   . Hypercholesterolemia   .  Esophageal reflux disease   . Hypertension   . Diabetes type 2, controlled   . Pancytopenia   . Ischemic colitis 2009  . Lung nodule 06/11/2012  . Depression   . Sleep apnea     UPPER AIRWAY RESISTANCE   DOES NOT HAVE CPAP , MAY NEED   . Tuberculosis      (+ TB TEST)YEARS AGO POSSIBLE EXPOSURE WAS MED TX   . Multiple myeloma 05/2012    Cytogenetics on 06/08/2012 was normal 71, XX [20]  . 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    ALLERGIES:  has No Known Allergies.  MEDICATIONS: has a current medication list which includes the following prescription(s): acyclovir, bupropion, esomeprazole, folic acid, furosemide, gabapentin, losartan, magnesium chloride, metoprolol succinate, preservision areds 2, potassium chloride, sertraline, simvastatin, sulfamethoxazole-trimethoprim, multivitamin with minerals, ondansetron, and prochlorperazine.  SURGICAL HISTORY:  Past Surgical History  Procedure Laterality Date  . Colonoscopy  2013    poly; Dr. Oletta Lamas, next one due 2018.l  . Appendectomy    . Tonsillectomy    . Leg surgery    . Video bronchoscopy with endobronchial navigation Right 07/08/2012    Procedure: VIDEO BRONCHOSCOPY WITH ENDOBRONCHIAL NAVIGATION;  Surgeon: Collene Gobble, MD;  Location: [facility];  Service: Thoracic;  Laterality: Right;    REVIEW OF SYSTEMS:   Constitutional: Denies fevers, chills or abnormal weight loss Eyes: Denies blurriness of vision Ears, nose, mouth, throat, and face: Denies mucositis or sore throat Respiratory: Denies cough, dyspnea or wheezes Cardiovascular: Denies palpitation, chest discomfort or lower extremity swelling Gastrointestinal:  Denies nausea, heartburn or change in bowel habits Skin: Denies abnormal skin  rashes Lymphatics: Denies new lymphadenopathy or easy bruising Neurological:Denies numbness, tingling or new weaknesses Behavioral/Psych: Mood is stable, no new changes  All other systems were reviewed with the patient and are negative.  PHYSICAL EXAMINATION: ECOG PERFORMANCE STATUS: 0 - Asymptomatic  Blood pressure 143/61, pulse 81, temperature 97.3 F (36.3 C), temperature source Oral, resp. rate 18, height _0  (1.651 m), weight 187 lb 4.8 oz (84.959 kg).  GENERAL:alert, no distress and comfortable; well developed and well nourished.  Alopecia.  SKIN: skin color, texture, turgor are normal, no rashes or significant lesions EYES: normal, Conjunctiva are pink and non-injected, sclera clear OROPHARYNX:no exudate, no erythema and lips, buccal mucosa, and tongue normal  NECK: supple, thyroid normal size, non-tender, without nodularity LYMPH:  no palpable lymphadenopathy in the cervical, axillary or supraclavicular LUNGS: clear to auscultation with normal breathing effort, no wheezes or rhonchi HEART: regular rate & rhythm and no murmurs and no lower extremity edema ABDOMEN:abdomen soft, non-tender and normal bowel sounds Musculoskeletal:no cyanosis of digits and no clubbing  NEURO: alert & oriented x 3 with fluent speech, no focal motor/sensory deficits  Labs:  Lab Results  Component Value Date   WBC 3.2* 04/22/2013   HGB 10.3* 04/22/2013   HCT 31.2* 04/22/2013   MCV 95.7 04/22/2013   PLT 153 04/22/2013   NEUTROABS 2.0 04/22/2013      Chemistry      Component Value Date/Time   NA 141 04/22/2013 1439   NA 139 07/07/2012 1353   K  4.4 04/22/2013 1439   K 4.2 07/07/2012 1353   CL 103 07/07/2012 1353   CL 105 06/08/2012 0913   CO2 22 04/22/2013 1439   CO2 27 07/07/2012 1353   BUN 19.0 04/22/2013 1439   BUN 17 07/07/2012 1353   CREATININE 0.7 04/22/2013 1439   CREATININE 0.65 07/07/2012 1353      Component Value Date/Time   CALCIUM 9.6 04/22/2013 1439   CALCIUM 10.1 07/07/2012 1353    ALKPHOS 98 04/22/2013 1439   ALKPHOS 94 07/07/2012 1353   AST 22 04/22/2013 1439   AST 38* 07/07/2012 1353   ALT 28 04/22/2013 1439   ALT 43* 07/07/2012 1353   BILITOT 0.33 04/22/2013 1439   BILITOT 0.4 07/07/2012 1353       Basic Metabolic Panel:  Recent Labs Lab 04/22/13 1439  NA 141  K 4.4  CO2 22  GLUCOSE 135  BUN 19.0  CREATININE 0.7  CALCIUM 9.6   CBC:  Recent Labs Lab 04/22/13 1439  WBC 3.2*  NEUTROABS 2.0  HGB 10.3*  HCT 31.2*  MCV 95.7  PLT 153   Studies:  No results found.   RADIOGRAPHIC STUDIES: No results found.  ASSESSMENT: [name] 68 y.o. 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 IgG Kappa MM s/p autologous SCT (02/08/2013).  -- She completed 4 cycles of VRd and had SCT at [facility]. She is continuing to recover her counts and her energy is improving. We followed her counts and her WBC and Plts are recovered.  -- She will continue acyclovir and bactrim per protocol.  --We will implement vaccinations per protocol.  --Consideration for maintenance therapy with velcade or revlimid in 3 months following transplant.   2. 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  --1.7cm on final pathology from [facility]. 0/8 lymph nodes were negative. Normal margins. Grade G1.   3. Anemia secondary to #1.  --She is asymptomatic.   4. Weight lost.  --Patient has lost nearly 20 lbs post transplant. We will continue boost/ensure with meals and monitor closely. She reports a 7 lb weight gain.   5. Follow-up. She will have repeat counts a close follow in one month.  All questions were answered. The patient knows to call the  clinic with any problems, questions or concerns. We can certainly see the patient much sooner if necessary.  I spent 10 minutes counseling the patient face to face. The total time spent in the appointment was 15 minutes.    Jessica Krogh, MD 04/23/2013 9:14 AM

## 2013-05-02 ENCOUNTER — Other Ambulatory Visit: Payer: PRIVATE HEALTH INSURANCE

## 2013-05-02 ENCOUNTER — Ambulatory Visit: Payer: PRIVATE HEALTH INSURANCE

## 2013-05-13 DIAGNOSIS — Z9221 Personal history of antineoplastic chemotherapy: Secondary | ICD-10-CM | POA: Diagnosis not present

## 2013-05-13 DIAGNOSIS — K219 Gastro-esophageal reflux disease without esophagitis: Secondary | ICD-10-CM | POA: Diagnosis not present

## 2013-05-13 DIAGNOSIS — E785 Hyperlipidemia, unspecified: Secondary | ICD-10-CM | POA: Diagnosis not present

## 2013-05-13 DIAGNOSIS — E119 Type 2 diabetes mellitus without complications: Secondary | ICD-10-CM | POA: Diagnosis not present

## 2013-05-13 DIAGNOSIS — I10 Essential (primary) hypertension: Secondary | ICD-10-CM | POA: Diagnosis not present

## 2013-05-13 DIAGNOSIS — C9000 Multiple myeloma not having achieved remission: Secondary | ICD-10-CM | POA: Diagnosis not present

## 2013-05-13 DIAGNOSIS — Z9484 Stem cells transplant status: Secondary | ICD-10-CM | POA: Diagnosis not present

## 2013-05-18 ENCOUNTER — Telehealth: Payer: Self-pay | Admitting: Internal Medicine

## 2013-05-18 NOTE — Telephone Encounter (Signed)
returned pt call adn r/s appt per pt request...pt aware ofn new d.t

## 2013-05-20 ENCOUNTER — Other Ambulatory Visit: Payer: Medicare Other

## 2013-05-20 ENCOUNTER — Ambulatory Visit: Payer: Medicare Other

## 2013-05-20 DIAGNOSIS — Z85118 Personal history of other malignant neoplasm of bronchus and lung: Secondary | ICD-10-CM | POA: Diagnosis not present

## 2013-05-20 DIAGNOSIS — C3430 Malignant neoplasm of lower lobe, unspecified bronchus or lung: Secondary | ICD-10-CM | POA: Diagnosis not present

## 2013-05-20 DIAGNOSIS — R911 Solitary pulmonary nodule: Secondary | ICD-10-CM | POA: Diagnosis not present

## 2013-05-26 ENCOUNTER — Other Ambulatory Visit (HOSPITAL_BASED_OUTPATIENT_CLINIC_OR_DEPARTMENT_OTHER): Payer: Medicare Other

## 2013-05-26 ENCOUNTER — Encounter: Payer: Self-pay | Admitting: Internal Medicine

## 2013-05-26 ENCOUNTER — Ambulatory Visit (HOSPITAL_BASED_OUTPATIENT_CLINIC_OR_DEPARTMENT_OTHER): Payer: Medicare Other | Admitting: Internal Medicine

## 2013-05-26 ENCOUNTER — Telehealth: Payer: Self-pay | Admitting: Internal Medicine

## 2013-05-26 VITALS — BP 120/64 | HR 80 | Temp 98.1°F | Resp 18 | Ht 65.0 in | Wt 191.2 lb

## 2013-05-26 DIAGNOSIS — R634 Abnormal weight loss: Secondary | ICD-10-CM

## 2013-05-26 DIAGNOSIS — C9000 Multiple myeloma not having achieved remission: Secondary | ICD-10-CM | POA: Diagnosis not present

## 2013-05-26 DIAGNOSIS — C7A090 Malignant carcinoid tumor of the bronchus and lung: Secondary | ICD-10-CM

## 2013-05-26 DIAGNOSIS — D649 Anemia, unspecified: Secondary | ICD-10-CM

## 2013-05-26 LAB — CBC WITH DIFFERENTIAL/PLATELET
BASO%: 0.3 % (ref 0.0–2.0)
Basophils Absolute: 0 10*3/uL (ref 0.0–0.1)
EOS ABS: 0.1 10*3/uL (ref 0.0–0.5)
EOS%: 2 % (ref 0.0–7.0)
HCT: 32.6 % — ABNORMAL LOW (ref 34.8–46.6)
HGB: 10.9 g/dL — ABNORMAL LOW (ref 11.6–15.9)
LYMPH#: 1 10*3/uL (ref 0.9–3.3)
LYMPH%: 29.3 % (ref 14.0–49.7)
MCH: 33 pg (ref 25.1–34.0)
MCHC: 33.4 g/dL (ref 31.5–36.0)
MCV: 98.7 fL (ref 79.5–101.0)
MONO#: 0.3 10*3/uL (ref 0.1–0.9)
MONO%: 8.1 % (ref 0.0–14.0)
NEUT%: 60.3 % (ref 38.4–76.8)
NEUTROS ABS: 2.1 10*3/uL (ref 1.5–6.5)
PLATELETS: 170 10*3/uL (ref 145–400)
RBC: 3.3 10*6/uL — AB (ref 3.70–5.45)
RDW: 15.3 % — ABNORMAL HIGH (ref 11.2–14.5)
WBC: 3.5 10*3/uL — AB (ref 3.9–10.3)

## 2013-05-26 LAB — BASIC METABOLIC PANEL (CC13)
Anion Gap: 11 mEq/L (ref 3–11)
BUN: 21.2 mg/dL (ref 7.0–26.0)
CALCIUM: 9.8 mg/dL (ref 8.4–10.4)
CHLORIDE: 109 meq/L (ref 98–109)
CO2: 21 meq/L — AB (ref 22–29)
Creatinine: 0.9 mg/dL (ref 0.6–1.1)
GLUCOSE: 192 mg/dL — AB (ref 70–140)
POTASSIUM: 4.3 meq/L (ref 3.5–5.1)
SODIUM: 141 meq/L (ref 136–145)

## 2013-05-26 LAB — LACTATE DEHYDROGENASE (CC13): LDH: 137 U/L (ref 125–245)

## 2013-05-26 NOTE — Progress Notes (Signed)
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 Multiple myeloma, without mention of having achieved remissi    CURRENT TREATMENT: Observation.    Multiple myeloma, without mention of having achieved remission(203.00)   05/27/2012 Initial Diagnosis Multiple myeloma, without mention of having achieved remission(203.00)   05/27/2012 Cancer Staging 1.  IgG kappa multiple myeloma. She presented in 05/2012 with anemia, Cr 0.8, Ca 9.6; no bone lytic lesion; LDH 140; beta2- microglobulin 1.86 (ref 1.01-1.73). Positive SPEP for M-spike at 1.69, IgG 2080, kappa 7.24, kappa:lambda ratio 24.97; UPEP positive   06/08/2012 Bone Marrow Biopsy Bone marrow biopsy on 06/08/12 showed 19% plasma cell; normal cytogenetics; myeloma FISH pending.    06/14/2012 Imaging Skeletal survey negative for bone lytic lesions.  Lung nodule apparent.   09/17/2012 Bone Marrow Biopsy Bone marrow biopsy showed 11% plasma cells.   10/18/2012 - 12/31/2012 Chemotherapy Started on VRD (Velcade 1.3 mg/m^2 [place] days 1,8 and 15; Revlimide 25 mg PO daily, on day 1 through 14; Decadron 40 mg PO Days 1, 8, 15).   Started on coumadin given RD.   01/14/2013 Bone Marrow Biopsy The bone marrow clot sections are normocellular toslightly hypercellular for age (approximately 40% overall). 1% plasma cells.   02/08/2013 Bone Marrow Transplant Auto Transplant at [facility] (Dr. Glorianne Manchester McIver).    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 Lamonte Sakai)   07/08/2012 Pathology Pathology consistent with Non-small cell lung cancer of RLL.    07/14/2012 Imaging 1. Right lower lobe nodule is non  hypermetabolic which suggests benign etiology considering the slow growing rate in 6 years or non FDG avid disease as seen with carcinoid tumors. Pending biopsy. No PET evidence of mediastinal nodal disease.    08/02/2012 Imaging MRI of brain negative.   08/12/2012 Surgery Robotic Lobectomy of R Lower lobe.    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     Surgical resection margins are negative.     Two benign lymph nodes.     pTa1N0   INTERVAL HISTORY: Jessica GROSECLOSE 68 y.o. female returns for her follow-up. She was last seen on 04/22/2013.  She is status post SCT on 02/08/2013 as noted above.  She is doing fine.  She saw Dr. Harvel Ricks about 2 months ago. He said everything appeared fine and to follow up in 3 months to start her immunizations.  He also said that because of her ongoing dyspnea, he would repeat some pulmonary function tests.  Last Friday, she visited the surgeon's PA and she reviewed the pathology report.  In 3 months, they plan a repeat CT of chest.  On May 8th, she sees the cardiologist (Dr. Beau Fanny).   She is on the diurectic.   She weighs 192 lbs up from 187 lb her last visit.  Her energy is slowly improving.   She is independent of all activities of daily living.   Patient denies anorexia, headache, visual changes, confusion, drenching night sweats, palpable lymph node swelling, mucositis, odynophagia, dysphagia, jaundice, chest pain, palpitation, occasional shortness of breath, dyspnea on exertion, productive cough, gum bleeding, epistaxis, hematemesis, hemoptysis, abdominal pain,  abdominal swelling, early satiety, melena, hematochezia, hematuria, skin rash, spontaneous bleeding, joint swelling, joint pain, heat or cold intolerance, bowel bladder incontinence, back pain, focal motor weakness, paresthesia.    MEDICAL HISTORY: Past Medical History  Diagnosis Date  . Osteoporosis   . Hypercholesterolemia   .  Esophageal reflux disease   . Hypertension   . Diabetes type 2, controlled   . Pancytopenia   . Ischemic colitis 2009  . Lung nodule 06/11/2012  . Depression   . Sleep apnea     UPPER AIRWAY RESISTANCE  DOES NOT HAVE CPAP , MAY NEED   . Tuberculosis      (+ TB TEST)YEARS AGO POSSIBLE EXPOSURE WAS MED TX   . Multiple myeloma 05/2012    Cytogenetics on 06/08/2012 was normal 68, XX [20]  . 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    ALLERGIES:  has No Known Allergies.  MEDICATIONS: has a current medication list which includes the following prescription(s): acyclovir, bupropion, esomeprazole, folic acid, furosemide, gabapentin, losartan, magnesium chloride, metoprolol succinate, multivitamin with minerals, preservision areds 2, sertraline, simvastatin, and sulfamethoxazole-trimethoprim.  SURGICAL HISTORY:  Past Surgical History  Procedure Laterality Date  . Colonoscopy  2013    poly; Dr. Oletta Lamas, next one due 2018.l  . Appendectomy    . Tonsillectomy    . Leg surgery    . Video bronchoscopy with endobronchial navigation Right 07/08/2012    Procedure: VIDEO BRONCHOSCOPY WITH ENDOBRONCHIAL NAVIGATION;  Surgeon: Collene Gobble, MD;  Location: [facility];  Service: Thoracic;  Laterality: Right;    REVIEW OF SYSTEMS:   Constitutional: Denies fevers, chills or abnormal weight loss Eyes: Denies blurriness of vision Ears, nose, mouth, throat, and face: Denies mucositis or sore throat Respiratory: Denies cough, dyspnea or wheezes Cardiovascular: Denies palpitation,  chest discomfort or lower extremity swelling Gastrointestinal:  Denies nausea, heartburn or change in bowel habits Skin: Denies abnormal skin rashes Lymphatics: Denies new lymphadenopathy or easy bruising Neurological:Denies numbness, tingling or new weaknesses Behavioral/Psych: Mood is stable, no new changes  All other systems were reviewed with the patient and are negative.  PHYSICAL EXAMINATION: ECOG PERFORMANCE STATUS: 0 - Asymptomatic  Blood pressure 120/64, pulse 80, temperature 98.1 F (36.7 C), temperature source Oral, resp. rate 18, height '5\' 5"'  (1.651 m), weight 191 lb 3.2 oz (86.728 kg), SpO2 98.00%.  GENERAL:alert, no distress and comfortable; well developed and well nourished.  Alopecia.  SKIN: skin color, texture, turgor are normal, no rashes or significant lesions EYES: normal, Conjunctiva are pink and non-injected, sclera clear OROPHARYNX:no exudate, no erythema and lips, buccal mucosa, and tongue normal  NECK: supple, thyroid normal size, non-tender, without nodularity LYMPH:  no palpable lymphadenopathy in the cervical, axillary or supraclavicular LUNGS: clear to auscultation with normal breathing effort, no wheezes or rhonchi HEART: regular rate & rhythm and no murmurs and no lower extremity edema ABDOMEN:abdomen soft, non-tender and normal bowel sounds Musculoskeletal:no cyanosis of digits and no clubbing  NEURO: alert & oriented x 3 with fluent speech, no focal motor/sensory deficits  Labs:  Lab Results  Component Value Date   WBC 3.5* 05/26/2013   HGB 10.9* 05/26/2013  HCT 32.6* 05/26/2013   MCV 98.7 05/26/2013   PLT 170 05/26/2013   NEUTROABS 2.1 05/26/2013      Chemistry      Component Value Date/Time   NA 141 04/22/2013 1439   NA 139 07/07/2012 1353   K 4.4 04/22/2013 1439   K 4.2 07/07/2012 1353   CL 103 07/07/2012 1353   CL 105 06/08/2012 0913   CO2 22 04/22/2013 1439   CO2 27 07/07/2012 1353   BUN 19.0 04/22/2013 1439   BUN 17 07/07/2012 1353    CREATININE 0.7 04/22/2013 1439   CREATININE 0.65 07/07/2012 1353      Component Value Date/Time   CALCIUM 9.6 04/22/2013 1439   CALCIUM 10.1 07/07/2012 1353   ALKPHOS 98 04/22/2013 1439   ALKPHOS 94 07/07/2012 1353   AST 22 04/22/2013 1439   AST 38* 07/07/2012 1353   ALT 28 04/22/2013 1439   ALT 43* 07/07/2012 1353   BILITOT 0.33 04/22/2013 1439   BILITOT 0.4 07/07/2012 1353     CBC:  Recent Labs Lab 05/26/13 1016  WBC 3.5*  NEUTROABS 2.1  HGB 10.9*  HCT 32.6*  MCV 98.7  PLT 170   Studies:  No results found.   RADIOGRAPHIC STUDIES: No results found.  ASSESSMENT: Len Childs 68 y.o. 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 IgG Kappa MM s/p autologous SCT (02/08/2013).  -- She completed 4 cycles of VRd and had SCT at [facility]. She is continuing to recover her counts and her energy is improving. We followed her counts and her WBC and Plts are recovered.  -- She will continue acyclovir and bactrim per protocol.  --We will implement vaccinations per protocol.  --Consideration for maintenance therapy with velcade or revlimid in 3 months following transplant on next visit.   2. 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  --1.7cm on final pathology from [facility]. 0/8 lymph nodes were negative. Normal margins. Grade G1.   3. Anemia secondary to #1.  --She is asymptomatic.   4. Weight lost.  --Patient has lost nearly 20 lbs post transplant. We will continue boost/ensure with meals and monitor closely. She reports a 5 lb weight gain.   5. Follow-up. She will have repeat counts a close follow in one month.  All questions were answered. The patient knows to call the clinic with any problems, questions or concerns. We can certainly see the patient much sooner if necessary.  I spent 10 minutes counseling the patient face  to face. The total time spent in the appointment was 15 minutes.    Concha Norway, MD 05/26/2013 10:56 AM

## 2013-05-26 NOTE — Telephone Encounter (Signed)
Gave pt appt appt for lab and MD for May 2015

## 2013-05-30 LAB — SPEP & IFE WITH QIG
ALBUMIN ELP: 65.9 % (ref 55.8–66.1)
ALPHA-1-GLOBULIN: 4.3 % (ref 2.9–4.9)
Alpha-2-Globulin: 10.7 % (ref 7.1–11.8)
Beta 2: 4.5 % (ref 3.2–6.5)
Beta Globulin: 7 % (ref 4.7–7.2)
Gamma Globulin: 7.6 % — ABNORMAL LOW (ref 11.1–18.8)
IGM, SERUM: 13 mg/dL — AB (ref 52–322)
IgA: 44 mg/dL — ABNORMAL LOW (ref 69–380)
IgG (Immunoglobin G), Serum: 488 mg/dL — ABNORMAL LOW (ref 690–1700)
TOTAL PROTEIN, SERUM ELECTROPHOR: 6.2 g/dL (ref 6.0–8.3)

## 2013-05-30 LAB — KAPPA/LAMBDA LIGHT CHAINS
KAPPA FREE LGHT CHN: 0.7 mg/dL (ref 0.33–1.94)
Kappa:Lambda Ratio: 0.8 (ref 0.26–1.65)
Lambda Free Lght Chn: 0.87 mg/dL (ref 0.57–2.63)

## 2013-06-03 DIAGNOSIS — Z9484 Stem cells transplant status: Secondary | ICD-10-CM | POA: Diagnosis not present

## 2013-06-03 DIAGNOSIS — I421 Obstructive hypertrophic cardiomyopathy: Secondary | ICD-10-CM | POA: Diagnosis not present

## 2013-06-03 DIAGNOSIS — C9000 Multiple myeloma not having achieved remission: Secondary | ICD-10-CM | POA: Diagnosis not present

## 2013-06-03 DIAGNOSIS — R0609 Other forms of dyspnea: Secondary | ICD-10-CM | POA: Diagnosis not present

## 2013-06-03 DIAGNOSIS — R0989 Other specified symptoms and signs involving the circulatory and respiratory systems: Secondary | ICD-10-CM | POA: Diagnosis not present

## 2013-06-03 DIAGNOSIS — I517 Cardiomegaly: Secondary | ICD-10-CM | POA: Diagnosis not present

## 2013-06-03 DIAGNOSIS — I5032 Chronic diastolic (congestive) heart failure: Secondary | ICD-10-CM | POA: Diagnosis not present

## 2013-06-13 DIAGNOSIS — G4733 Obstructive sleep apnea (adult) (pediatric): Secondary | ICD-10-CM | POA: Diagnosis not present

## 2013-06-13 DIAGNOSIS — I10 Essential (primary) hypertension: Secondary | ICD-10-CM | POA: Diagnosis not present

## 2013-06-24 ENCOUNTER — Other Ambulatory Visit (HOSPITAL_BASED_OUTPATIENT_CLINIC_OR_DEPARTMENT_OTHER): Payer: Medicare Other

## 2013-06-24 ENCOUNTER — Other Ambulatory Visit: Payer: Self-pay

## 2013-06-24 ENCOUNTER — Telehealth: Payer: Self-pay | Admitting: Internal Medicine

## 2013-06-24 ENCOUNTER — Ambulatory Visit (HOSPITAL_BASED_OUTPATIENT_CLINIC_OR_DEPARTMENT_OTHER): Payer: Medicare Other | Admitting: Internal Medicine

## 2013-06-24 VITALS — BP 130/74 | HR 76 | Temp 97.0°F | Resp 18 | Ht 65.0 in | Wt 195.7 lb

## 2013-06-24 DIAGNOSIS — C9000 Multiple myeloma not having achieved remission: Secondary | ICD-10-CM

## 2013-06-24 DIAGNOSIS — D472 Monoclonal gammopathy: Secondary | ICD-10-CM | POA: Diagnosis not present

## 2013-06-24 DIAGNOSIS — D630 Anemia in neoplastic disease: Secondary | ICD-10-CM | POA: Diagnosis not present

## 2013-06-24 DIAGNOSIS — R634 Abnormal weight loss: Secondary | ICD-10-CM | POA: Diagnosis not present

## 2013-06-24 DIAGNOSIS — Z902 Acquired absence of lung [part of]: Secondary | ICD-10-CM

## 2013-06-24 DIAGNOSIS — C7A090 Malignant carcinoid tumor of the bronchus and lung: Secondary | ICD-10-CM | POA: Diagnosis not present

## 2013-06-24 LAB — COMPREHENSIVE METABOLIC PANEL (CC13)
ALBUMIN: 3.8 g/dL (ref 3.5–5.0)
ALT: 36 U/L (ref 0–55)
AST: 34 U/L (ref 5–34)
Alkaline Phosphatase: 91 U/L (ref 40–150)
Anion Gap: 14 mEq/L — ABNORMAL HIGH (ref 3–11)
BUN: 17.3 mg/dL (ref 7.0–26.0)
CALCIUM: 9.4 mg/dL (ref 8.4–10.4)
CO2: 19 mEq/L — ABNORMAL LOW (ref 22–29)
Chloride: 108 mEq/L (ref 98–109)
Creatinine: 0.8 mg/dL (ref 0.6–1.1)
Glucose: 137 mg/dl (ref 70–140)
POTASSIUM: 4.1 meq/L (ref 3.5–5.1)
Sodium: 141 mEq/L (ref 136–145)
Total Bilirubin: 0.41 mg/dL (ref 0.20–1.20)
Total Protein: 6.4 g/dL (ref 6.4–8.3)

## 2013-06-24 LAB — CBC WITH DIFFERENTIAL/PLATELET
BASO%: 0.3 % (ref 0.0–2.0)
Basophils Absolute: 0 10*3/uL (ref 0.0–0.1)
EOS%: 1.9 % (ref 0.0–7.0)
Eosinophils Absolute: 0.1 10*3/uL (ref 0.0–0.5)
HCT: 32.9 % — ABNORMAL LOW (ref 34.8–46.6)
HEMOGLOBIN: 11 g/dL — AB (ref 11.6–15.9)
LYMPH%: 23.7 % (ref 14.0–49.7)
MCH: 32.5 pg (ref 25.1–34.0)
MCHC: 33.4 g/dL (ref 31.5–36.0)
MCV: 97.3 fL (ref 79.5–101.0)
MONO#: 0.3 10*3/uL (ref 0.1–0.9)
MONO%: 10.3 % (ref 0.0–14.0)
NEUT#: 2 10*3/uL (ref 1.5–6.5)
NEUT%: 63.8 % (ref 38.4–76.8)
Platelets: 149 10*3/uL (ref 145–400)
RBC: 3.38 10*6/uL — ABNORMAL LOW (ref 3.70–5.45)
RDW: 13.5 % (ref 11.2–14.5)
WBC: 3.1 10*3/uL — ABNORMAL LOW (ref 3.9–10.3)
lymph#: 0.7 10*3/uL — ABNORMAL LOW (ref 0.9–3.3)
nRBC: 0 % (ref 0–0)

## 2013-06-24 MED ORDER — LENALIDOMIDE 10 MG PO CAPS: 10.0000 mg | ORAL_CAPSULE | Freq: Every day | ORAL | Status: DC

## 2013-06-24 NOTE — Progress Notes (Signed)
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 Multiple myeloma, without mention of having achieved remissi    CURRENT TREATMENT: Observation.    Multiple myeloma, without mention of having achieved remission(203.00)   05/27/2012 Initial Diagnosis Multiple myeloma, without mention of having achieved remission(203.00)   05/27/2012 Cancer Staging 1.  IgG kappa multiple myeloma. She presented in 05/2012 with anemia, Cr 0.8, Ca 9.6; no bone lytic lesion; LDH 140; beta2- microglobulin 1.86 (ref 1.01-1.73). Positive SPEP for M-spike at 1.69, IgG 2080, kappa 7.24, kappa:lambda ratio 24.97; UPEP positive   06/08/2012 Bone Marrow Biopsy Bone marrow biopsy on 06/08/12 showed 19% plasma cell; normal cytogenetics; myeloma FISH pending.    06/14/2012 Imaging Skeletal survey negative for bone lytic lesions.  Lung nodule apparent.   09/17/2012 Bone Marrow Biopsy Bone marrow biopsy showed 11% plasma cells.   10/18/2012 - 12/31/2012 Chemotherapy Started on VRD (Velcade 1.3 mg/m^2 [place] days 1,8 and 15; Revlimide 25 mg PO daily, on day 1 through 14; Decadron 40 mg PO Days 1, 8, 15).   Started on coumadin given RD.   01/14/2013 Bone Marrow Biopsy The bone marrow clot sections are normocellular toslightly hypercellular for age (approximately 40% overall). 1% plasma cells.   02/08/2013 Bone Marrow Transplant Auto Transplant at [facility] (Dr. Glorianne Manchester McIver).    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 Lamonte Sakai)   07/08/2012 Pathology Pathology consistent with Non-small cell lung cancer of RLL.    07/14/2012 Imaging 1. Right lower lobe nodule is non hypermetabolic which suggests benign etiology considering the slow growing rate in 6 years or non FDG avid disease as seen with carcinoid tumors. Pending biopsy. No PET evidence of mediastinal nodal disease.    08/02/2012 Imaging MRI of brain negative.   08/12/2012 Surgery Robotic Lobectomy of R Lower lobe.    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     Surgical resection margins are negative.     Two benign lymph nodes.     pTa1N0   INTERVAL HISTORY: KATHRIN FOLDEN 68 y.o. female returns for her follow-up. She was last seen on 05/26/2013.  She is status post SCT on 02/08/2013 as noted above.  She is doing fine.  She saw Dr. Harvel Ricks about 3 months ago. He said everything appeared fine and to follow up in 2 months to start her immunizations.  He also said that because of her ongoing dyspnea, he would repeat some pulmonary function tests.  Last Friday, she visited the surgeon's PA and she reviewed the pathology report.  In 2 months, they plan a repeat CT of chest.  On May 8th, she saw the cardiologist (Dr. Beau Fanny).  She weighs 196 lbs up from 192 lbs her last visit.  Her energy is slowly improving.   She is independent of all activities of daily living.   Patient denies anorexia, headache, visual changes, confusion, drenching night sweats, palpable lymph node swelling, mucositis, odynophagia, dysphagia, jaundice,  chest pain, palpitation, occasional shortness of breath, dyspnea on exertion, productive cough, gum bleeding, epistaxis, hematemesis, hemoptysis, abdominal pain, abdominal swelling, early satiety, melena, hematochezia, hematuria, skin rash, spontaneous bleeding, joint swelling, joint pain, heat or cold intolerance, bowel bladder  incontinence, back pain, focal motor weakness, paresthesia.    MEDICAL HISTORY: Past Medical History  Diagnosis Date  . Osteoporosis   . Hypercholesterolemia   . Esophageal reflux disease   . Hypertension   . Diabetes type 2, controlled   . Pancytopenia   . Ischemic colitis 2009  . Lung nodule 06/11/2012  . Depression   . Sleep apnea     UPPER AIRWAY RESISTANCE  DOES NOT HAVE CPAP , MAY NEED   . Tuberculosis      (+ TB TEST)YEARS AGO POSSIBLE EXPOSURE WAS MED TX   . Multiple myeloma 05/2012    Cytogenetics on 06/08/2012 was normal 21, XX [20]  . 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    ALLERGIES:  has No Known Allergies.  MEDICATIONS: has a current medication list which includes the following prescription(s): acyclovir, bupropion, folic acid, gabapentin, losartan, magnesium chloride, metoprolol succinate, multivitamin with minerals, preservision areds 2, sertraline, simvastatin, sulfamethoxazole-trimethoprim, esomeprazole, and lenalidomide.  SURGICAL HISTORY:  Past Surgical History  Procedure Laterality Date  . Colonoscopy  2013    poly; Dr. Oletta Lamas, next one due 2018.l  . Appendectomy    . Tonsillectomy    . Leg surgery    . Video bronchoscopy with endobronchial navigation Right 07/08/2012    Procedure: VIDEO BRONCHOSCOPY WITH ENDOBRONCHIAL NAVIGATION;  Surgeon: Collene Gobble, MD;  Location: [place];  Service: Thoracic;  Laterality: Right;    REVIEW OF SYSTEMS:   Constitutional: Denies fevers, chills or abnormal weight loss Eyes: Denies  blurriness of vision Ears, nose, mouth, throat, and face: Denies mucositis or sore throat Respiratory: Denies cough, dyspnea or wheezes Cardiovascular: Denies palpitation, chest discomfort or lower extremity swelling Gastrointestinal:  Denies nausea, heartburn or change in bowel habits Skin: Denies abnormal skin rashes Lymphatics: Denies new lymphadenopathy or easy bruising Neurological:Denies numbness, tingling or new weaknesses Behavioral/Psych: Mood is stable, no new changes  All other systems were reviewed with the patient and are negative.  PHYSICAL EXAMINATION: ECOG PERFORMANCE STATUS: 0 - Asymptomatic  Blood pressure 130/74, pulse 76, temperature 97 F (36.1 C), temperature source Oral, resp. rate 18, height _0  (1.651 m), weight 195 lb 11.2 oz (88.769 kg), SpO2 97.00%.  GENERAL:alert, no distress and comfortable; well developed and well nourished.  Alopecia.  SKIN: skin color, texture, turgor are normal, no rashes or significant lesions EYES: normal, Conjunctiva are pink and non-injected, sclera clear OROPHARYNX:no exudate, no erythema and lips, buccal mucosa, and tongue normal  NECK: supple, thyroid normal size, non-tender, without nodularity LYMPH:  no palpable lymphadenopathy in the cervical, axillary or supraclavicular LUNGS: clear to auscultation with normal breathing effort, no wheezes or rhonchi HEART: regular rate & rhythm and no murmurs and no lower extremity edema ABDOMEN:abdomen soft, non-tender and normal bowel sounds Musculoskeletal:no cyanosis of digits and no clubbing  NEURO: alert & oriented x 3 with fluent speech, no focal motor/sensory deficits  Labs:  Lab Results  Component Value Date   WBC 3.1* 06/24/2013   HGB 11.0* 06/24/2013   HCT 32.9* 06/24/2013   MCV 97.3 06/24/2013   PLT 149 06/24/2013   NEUTROABS 2.0 06/24/2013      Chemistry      Component Value Date/Time   NA 141 06/24/2013 1036   NA 139 07/07/2012 1353   K 4.1 06/24/2013 1036   K 4.2  07/07/2012 1353   CL 103 07/07/2012 1353   CL 105 06/08/2012 0913   CO2 19* 06/24/2013 1036   CO2 27 07/07/2012 1353   BUN 17.3 06/24/2013 1036   BUN 17 07/07/2012 1353   CREATININE 0.8 06/24/2013 1036   CREATININE 0.65 07/07/2012 1353      Component Value Date/Time   CALCIUM 9.4 06/24/2013 1036   CALCIUM 10.1 07/07/2012 1353   ALKPHOS 91 06/24/2013 1036   ALKPHOS 94 07/07/2012 1353   AST 34 06/24/2013 1036   AST 38* 07/07/2012 1353   ALT 36 06/24/2013 1036   ALT 43* 07/07/2012 1353   BILITOT 0.41 06/24/2013 1036   BILITOT 0.4 07/07/2012 1353     CBC:  Recent Labs Lab 06/24/13 1036  WBC 3.1*  NEUTROABS 2.0  HGB 11.0*  HCT 32.9*  MCV 97.3  PLT 149   Studies:  No results found.   RADIOGRAPHIC STUDIES: No results found.  ASSESSMENT: Len Childs 68 y.o. 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 IgG Kappa MM s/p autologous SCT (02/08/2013).  -- She completed 4 cycles of VRd and had SCT at [facility]. She is continuing to recover her counts and her energy is improving. We followed her counts and her WBC and Plts are recovered.  -- She will continue acyclovir and bactrim per protocol.  --We will implement vaccinations per protocol.  -- We started maintenance therapy with revlimid 10 mg daily today. We discussed extensively maintenance therapy options with lenalidomide 33m to 10 mg once daily for 3 months, then increased to 15 mg daily if tolerated; continue until relapse (Attal, 2012; MAnabel Bene 2012) or 10 mg once daily for 21 days of a 28-day treatment cycle until relapse (Palumbo, 2010). This is a catergory one recommendation based on The CALG 100104 trial demonstrating an improved  median time to progression (46 months for lenalidomide group versus 27 months for placebo) and disease progression or death (37% versus 58% repectively). However, second primary cancers occurred more frequently with the lenalidomide group (8% versus 3% in placebo) and increased with of severe neutropenia. She evaluated these risks and decided and agreed to proceed with lenaladomide maintenance therapy [facility]  She will start low dose ASA with revlimid.   2. 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  --[facility] lymph nodes were negative. Normal margins. Grade G1.   3. Anemia secondary to #1.  --She is asymptomatic.   4. Weight lost.  --Patient has lost nearly 20 lbs post transplant. We will continue boost/ensure with meals and monitor closely. She reports a 4 lb weight gain since her last visit. .   5. Follow-up. She will have repeat counts  a close follow in one month.  All questions were answered. The patient knows to call the clinic with any problems, questions or concerns. We can certainly see the patient much sooner if necessary.  I spent 15 minutes counseling the patient face to face. The total time spent in the appointment was 25 minutes.    Concha Norway, MD 06/24/2013 4:08 PM

## 2013-06-24 NOTE — Telephone Encounter (Signed)
gv pt appt schedule for june.  °

## 2013-06-27 LAB — IGG, IGA, IGM
IgA: 40 mg/dL — ABNORMAL LOW (ref 69–380)
IgG (Immunoglobin G), Serum: 483 mg/dL — ABNORMAL LOW (ref 690–1700)
IgM, Serum: 11 mg/dL — ABNORMAL LOW (ref 52–322)

## 2013-06-27 LAB — KAPPA/LAMBDA LIGHT CHAINS
KAPPA LAMBDA RATIO: 0.23 — AB (ref 0.26–1.65)
Kappa free light chain: 0.15 mg/dL — ABNORMAL LOW (ref 0.33–1.94)
Lambda Free Lght Chn: 0.66 mg/dL (ref 0.57–2.63)

## 2013-06-27 NOTE — Telephone Encounter (Signed)
RECEIVED A FAX FROM [facility] CONCERNING A CONFIRMATION OF FACSIMILE RECEIPT FOR PT. REFERRAL. 

## 2013-06-30 ENCOUNTER — Encounter: Payer: Self-pay | Admitting: Internal Medicine

## 2013-06-30 NOTE — Progress Notes (Signed)
[employer], [id number], approved revlimid from 06/30/13-07/01/14

## 2013-07-04 NOTE — Telephone Encounter (Signed)
RECEIVED A FAX FROM [employer] CONCERNING A CONFIRMATION OF PRESCRIPTION SHIPMENT FOR REVLIMID ON 07/01/13.

## 2013-07-08 ENCOUNTER — Other Ambulatory Visit (HOSPITAL_BASED_OUTPATIENT_CLINIC_OR_DEPARTMENT_OTHER): Payer: Medicare Other

## 2013-07-08 DIAGNOSIS — C9000 Multiple myeloma not having achieved remission: Secondary | ICD-10-CM

## 2013-07-08 LAB — CBC WITH DIFFERENTIAL/PLATELET
BASO%: 0.4 % (ref 0.0–2.0)
Basophils Absolute: 0 10*3/uL (ref 0.0–0.1)
EOS%: 1.7 % (ref 0.0–7.0)
Eosinophils Absolute: 0.1 10*3/uL (ref 0.0–0.5)
HCT: 34.4 % — ABNORMAL LOW (ref 34.8–46.6)
HGB: 11.2 g/dL — ABNORMAL LOW (ref 11.6–15.9)
LYMPH#: 0.9 10*3/uL (ref 0.9–3.3)
LYMPH%: 24.3 % (ref 14.0–49.7)
MCH: 32.8 pg (ref 25.1–34.0)
MCHC: 32.6 g/dL (ref 31.5–36.0)
MCV: 100.5 fL (ref 79.5–101.0)
MONO#: 0.4 10*3/uL (ref 0.1–0.9)
MONO%: 10 % (ref 0.0–14.0)
NEUT%: 63.6 % (ref 38.4–76.8)
NEUTROS ABS: 2.3 10*3/uL (ref 1.5–6.5)
Platelets: 153 10*3/uL (ref 145–400)
RBC: 3.42 10*6/uL — AB (ref 3.70–5.45)
RDW: 13.7 % (ref 11.2–14.5)
WBC: 3.5 10*3/uL — AB (ref 3.9–10.3)

## 2013-07-21 ENCOUNTER — Other Ambulatory Visit: Payer: Self-pay | Admitting: *Deleted

## 2013-07-21 NOTE — Telephone Encounter (Signed)
THIS REFILL REQUEST FOR REVLIMID WAS GIVEN TO DR.CHISM'S NURSE, KATHY BUYCK,RN.

## 2013-07-22 ENCOUNTER — Telehealth: Payer: Self-pay | Admitting: Internal Medicine

## 2013-07-22 ENCOUNTER — Other Ambulatory Visit (HOSPITAL_BASED_OUTPATIENT_CLINIC_OR_DEPARTMENT_OTHER): Payer: Medicare Other

## 2013-07-22 ENCOUNTER — Encounter: Payer: Self-pay | Admitting: Internal Medicine

## 2013-07-22 ENCOUNTER — Ambulatory Visit (HOSPITAL_BASED_OUTPATIENT_CLINIC_OR_DEPARTMENT_OTHER): Payer: Medicare Other | Admitting: Internal Medicine

## 2013-07-22 VITALS — BP 123/69 | HR 80 | Temp 97.0°F | Resp 18 | Ht 65.0 in | Wt 198.2 lb

## 2013-07-22 DIAGNOSIS — C7A090 Malignant carcinoid tumor of the bronchus and lung: Secondary | ICD-10-CM | POA: Diagnosis not present

## 2013-07-22 DIAGNOSIS — C9000 Multiple myeloma not having achieved remission: Secondary | ICD-10-CM

## 2013-07-22 DIAGNOSIS — D630 Anemia in neoplastic disease: Secondary | ICD-10-CM

## 2013-07-22 LAB — CBC WITH DIFFERENTIAL/PLATELET
BASO%: 0.5 % (ref 0.0–2.0)
BASOS ABS: 0 10*3/uL (ref 0.0–0.1)
EOS%: 2.1 % (ref 0.0–7.0)
Eosinophils Absolute: 0.1 10*3/uL (ref 0.0–0.5)
HEMATOCRIT: 33.5 % — AB (ref 34.8–46.6)
HEMOGLOBIN: 11 g/dL — AB (ref 11.6–15.9)
LYMPH%: 19.6 % (ref 14.0–49.7)
MCH: 32.7 pg (ref 25.1–34.0)
MCHC: 32.8 g/dL (ref 31.5–36.0)
MCV: 99.7 fL (ref 79.5–101.0)
MONO#: 0.2 10*3/uL (ref 0.1–0.9)
MONO%: 7.6 % (ref 0.0–14.0)
NEUT#: 2 10*3/uL (ref 1.5–6.5)
NEUT%: 70.2 % (ref 38.4–76.8)
Platelets: 155 10*3/uL (ref 145–400)
RBC: 3.36 10*6/uL — ABNORMAL LOW (ref 3.70–5.45)
RDW: 13.8 % (ref 11.2–14.5)
WBC: 2.9 10*3/uL — AB (ref 3.9–10.3)
lymph#: 0.6 10*3/uL — ABNORMAL LOW (ref 0.9–3.3)

## 2013-07-22 LAB — COMPREHENSIVE METABOLIC PANEL (CC13)
ALT: 45 U/L (ref 0–55)
ANION GAP: 11 meq/L (ref 3–11)
AST: 34 U/L (ref 5–34)
Albumin: 3.8 g/dL (ref 3.5–5.0)
Alkaline Phosphatase: 104 U/L (ref 40–150)
BUN: 19.7 mg/dL (ref 7.0–26.0)
CALCIUM: 9.5 mg/dL (ref 8.4–10.4)
CHLORIDE: 109 meq/L (ref 98–109)
CO2: 22 mEq/L (ref 22–29)
CREATININE: 0.8 mg/dL (ref 0.6–1.1)
GLUCOSE: 158 mg/dL — AB (ref 70–140)
Potassium: 4.1 mEq/L (ref 3.5–5.1)
Sodium: 141 mEq/L (ref 136–145)
Total Bilirubin: 0.39 mg/dL (ref 0.20–1.20)
Total Protein: 6.5 g/dL (ref 6.4–8.3)

## 2013-07-22 LAB — LACTATE DEHYDROGENASE (CC13): LDH: 163 U/L (ref 125–245)

## 2013-07-22 NOTE — Telephone Encounter (Signed)
gv and printed appt sched and avs fo rpt for July °

## 2013-07-22 NOTE — Progress Notes (Signed)
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 Multiple myeloma, without mention of having achieved remissi    CURRENT TREATMENT: Observation.    Multiple myeloma, without mention of having achieved remission(203.00)   05/27/2012 Initial Diagnosis Multiple myeloma, without mention of having achieved remission(203.00)   05/27/2012 Cancer Staging 1.  IgG kappa multiple myeloma. She presented in 05/2012 with anemia, Cr 0.8, Ca 9.6; no bone lytic lesion; LDH 140; beta2- microglobulin 1.86 (ref 1.01-1.73). Positive SPEP for M-spike at 1.69, IgG 2080, kappa 7.24, kappa:lambda ratio 24.97; UPEP positive   06/08/2012 Bone Marrow Biopsy Bone marrow biopsy on 06/08/12 showed 19% plasma cell; normal cytogenetics; myeloma FISH pending.    06/14/2012 Imaging Skeletal survey negative for bone lytic lesions.  Lung nodule apparent.   09/17/2012 Bone Marrow Biopsy Bone marrow biopsy showed 11% plasma cells.   10/18/2012 - 12/31/2012 Chemotherapy Started on VRD (Velcade 1.3 mg/m^2 [place] days 1,8 and 15; Revlimide 25 mg PO daily, on day 1 through 14; Decadron 40 mg PO Days 1, 8, 15).   Started on coumadin given RD.   01/14/2013 Bone Marrow Biopsy The bone marrow clot sections are normocellular toslightly hypercellular for age (approximately 40% overall). 1% plasma cells.   02/08/2013 Bone Marrow Transplant Auto Transplant at [facility] (Dr. Glorianne Manchester McIver).    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 Lamonte Sakai)   07/08/2012 Pathology Pathology consistent with  Non-small cell lung cancer of RLL.    07/14/2012 Imaging 1. Right lower lobe nodule is non hypermetabolic which suggests benign etiology considering the slow growing rate in 6 years or non FDG avid disease as seen with carcinoid tumors. Pending biopsy. No PET evidence of mediastinal nodal disease.    08/02/2012 Imaging MRI of brain negative.   08/12/2012 Surgery Robotic Lobectomy of R Lower lobe.    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     Surgical resection margins are negative.     Two benign lymph nodes.     pTa1N0   INTERVAL HISTORY: [name] 68 y.o. female returns for her follow-up. She was last seen on 06/24/2013.  She is status post SCT on 02/08/2013 as noted above.  She is doing fine.  She saw Dr. Harvel Ricks about 4 months ago. He said everything appeared fine and to follow up in 1 month to start her immunizations.  In 1 month, surgery plans to  repeat CT of chest.  On May 8th, she saw the cardiologist (Dr. Beau Fanny).  She weighs 198 lbs up from 196 lbs her last visit.  Her energy is slowly improving.   She is independent of all activities of daily living.   Patient denies anorexia, headache, visual changes, confusion, drenching night sweats, palpable lymph node swelling, mucositis, odynophagia, dysphagia, jaundice, chest pain, palpitation, occasional shortness of breath, dyspnea on exertion, productive cough, gum bleeding, epistaxis, hematemesis, hemoptysis, abdominal pain, abdominal swelling, early satiety, melena, hematochezia, hematuria, skin rash, spontaneous bleeding, joint swelling, joint pain, heat or cold intolerance, bowel bladder incontinence,  back pain, focal motor weakness, paresthesia.    MEDICAL HISTORY: Past Medical History  Diagnosis Date  . Osteoporosis   . Hypercholesterolemia   . Esophageal reflux disease   . Hypertension   . Diabetes type 2, controlled   . Pancytopenia   . Ischemic colitis 2009   . Lung nodule 06/11/2012  . Depression   . Sleep apnea     UPPER AIRWAY RESISTANCE  DOES NOT HAVE CPAP , MAY NEED   . Tuberculosis      (+ TB TEST)YEARS AGO POSSIBLE EXPOSURE WAS MED TX   . Multiple myeloma 05/2012    Cytogenetics on 06/08/2012 was normal 72, XX [20]  . 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    ALLERGIES:  has No Known Allergies.  MEDICATIONS: has a current medication list which includes the following prescription(s): acyclovir, bupropion, esomeprazole, folic acid, losartan, magnesium chloride, metoprolol succinate, multivitamin with minerals, preservision areds 2, sertraline, simvastatin, sulfamethoxazole-trimethoprim, gabapentin, and lenalidomide.  SURGICAL HISTORY:  Past Surgical History  Procedure Laterality Date  . Colonoscopy  2013    poly; Dr. Oletta Lamas, next one due 2018.l  . Appendectomy    . Tonsillectomy    . Leg surgery    . Video bronchoscopy with endobronchial navigation Right 07/08/2012    Procedure: VIDEO BRONCHOSCOPY WITH ENDOBRONCHIAL NAVIGATION;  Surgeon: Collene Gobble, MD;  Location: [facility];  Service: Thoracic;  Laterality: Right;    REVIEW OF SYSTEMS:   Constitutional: Denies fevers, chills or abnormal weight loss Eyes: Denies blurriness of vision Ears, nose, mouth, throat, and face: Denies mucositis or sore throat Respiratory: Denies cough, dyspnea or wheezes Cardiovascular: Denies palpitation, chest discomfort or lower extremity swelling Gastrointestinal:  Denies nausea, heartburn or change in bowel  habits Skin: Denies abnormal skin rashes Lymphatics: Denies new lymphadenopathy or easy bruising Neurological:Denies numbness, tingling or new weaknesses Behavioral/Psych: Mood is stable, no new changes  All other systems were reviewed with the patient and are negative.  PHYSICAL EXAMINATION: ECOG PERFORMANCE STATUS: 0 - Asymptomatic  Blood pressure 123/69, pulse 80, temperature 97 F (36.1 C), temperature source Oral, resp. rate 18, height '5\' 5"'  (1.651 m), weight 198 lb 3.2 oz (89.903 kg), SpO2 100.00%.  GENERAL:alert, no distress and comfortable; well developed and well nourished.  Alopecia.  SKIN: skin color, texture, turgor are normal, no rashes or significant lesions EYES: normal, Conjunctiva are pink and non-injected, sclera clear OROPHARYNX:no exudate, no erythema and lips, buccal mucosa, and tongue normal  NECK: supple, thyroid normal size, non-tender, without nodularity LYMPH:  no palpable lymphadenopathy in the cervical, axillary or supraclavicular LUNGS: clear to auscultation with normal breathing effort, no wheezes or rhonchi HEART: regular rate & rhythm and no murmurs and no lower extremity edema ABDOMEN:abdomen soft, non-tender and normal bowel sounds Musculoskeletal:no cyanosis of digits and no clubbing  NEURO: alert & oriented x 3 with fluent speech, no focal motor/sensory deficits  Labs:  Lab Results  Component Value Date   WBC 2.9* 07/22/2013   HGB 11.0* 07/22/2013   HCT 33.5* 07/22/2013   MCV 99.7 07/22/2013   PLT 155 07/22/2013   NEUTROABS 2.0 07/22/2013  Chemistry      Component Value Date/Time   NA 141 07/22/2013 1122   NA 139 07/07/2012 1353   K 4.1 07/22/2013 1122   K 4.2 07/07/2012 1353   CL 103 07/07/2012 1353   CL 105 06/08/2012 0913   CO2 22 07/22/2013 1122   CO2 27 07/07/2012 1353   BUN 19.7 07/22/2013 1122   BUN 17 07/07/2012 1353   CREATININE 0.8 07/22/2013 1122   CREATININE 0.65 07/07/2012 1353      Component Value Date/Time   CALCIUM 9.5  07/22/2013 1122   CALCIUM 10.1 07/07/2012 1353   ALKPHOS 104 07/22/2013 1122   ALKPHOS 94 07/07/2012 1353   AST 34 07/22/2013 1122   AST 38* 07/07/2012 1353   ALT 45 07/22/2013 1122   ALT 43* 07/07/2012 1353   BILITOT 0.39 07/22/2013 1122   BILITOT 0.4 07/07/2012 1353     CBC:  Recent Labs Lab 07/22/13 1122  WBC 2.9*  NEUTROABS 2.0  HGB 11.0*  HCT 33.5*  MCV 99.7  PLT 155   Studies:  No results found.   RADIOGRAPHIC STUDIES: No results found.  ASSESSMENT: [name] 68 y.o. 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 IgG Kappa MM s/p autologous SCT (02/08/2013).  -- She completed 4 cycles of VRd and had SCT at [facility]. She is continuing to recover her counts and her energy is improving. We followed her counts and her WBC and Plts are recovered.  -- She will continue acyclovir and bactrim per protocol.  --We will implement vaccinations per protocol.  -- We recommended starting  maintenance therapy with revlimid 10 mg daily today. She has the revlimid at her home but has not started it. We discussed extensively maintenance therapy options with lenalidomide 59m to 10 mg once daily for 3 months, then increased to 15 mg daily if tolerated; continue until relapse (Attal, 2012; MAnabel Bene 2012) or 10 mg once daily for 21 days of a 28-day treatment cycle until relapse (Palumbo, 2010). This is a catergory one recommendation based on The CALG 100104 trial demonstrating an improved median time to progression (46 months for lenalidomide group versus 27 months for placebo) and disease progression or death (37% versus 58% repectively). However, second primary cancers occurred more frequently with the lenalidomide group (8% versus 3% in placebo) and increased with of severe neutropenia.  She evaluated these risks and decided and agreed to proceed with lenaladomide maintenance therapy [facility]  She will start low dose ASA with revlimid.   2. 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  --[facility] lymph nodes were negative. Normal margins. Grade G1.   3. Anemia secondary to #1.  --She is asymptomatic. .Marland Kitchen  4. Follow-up. She will have repeat counts a close follow in one month.  All questions were answered. The patient knows to call the clinic with any problems, questions or concerns. We can certainly see the patient much sooner if necessary.  I spent 15 minutes counseling the patient face to face. The total time spent in the appointment was 25 minutes.    CHISM, DAVID, MD 07/23/2013 7:52 AM

## 2013-08-05 ENCOUNTER — Telehealth: Payer: Self-pay

## 2013-08-05 NOTE — Telephone Encounter (Signed)
Pt lvm stating she had rash in her armpits that is mostly gone. She now has rash on upper thighs toward groin. It is red. It is not itching or hurting. She has no other symptoms. She started her revlimid June 28. S/w Dr Juliann Mule. He said to use benadryl cream and/or pill. Pt is to call back in a few days if the rash persists or worsens. Pt restated instructions.

## 2013-08-08 ENCOUNTER — Other Ambulatory Visit (HOSPITAL_BASED_OUTPATIENT_CLINIC_OR_DEPARTMENT_OTHER): Payer: Medicare Other

## 2013-08-08 DIAGNOSIS — D630 Anemia in neoplastic disease: Secondary | ICD-10-CM

## 2013-08-08 DIAGNOSIS — C9000 Multiple myeloma not having achieved remission: Secondary | ICD-10-CM | POA: Diagnosis not present

## 2013-08-08 DIAGNOSIS — C7A090 Malignant carcinoid tumor of the bronchus and lung: Secondary | ICD-10-CM

## 2013-08-08 LAB — CBC WITH DIFFERENTIAL/PLATELET
BASO%: 0.7 % (ref 0.0–2.0)
BASOS ABS: 0 10*3/uL (ref 0.0–0.1)
EOS%: 4.8 % (ref 0.0–7.0)
Eosinophils Absolute: 0.1 10*3/uL (ref 0.0–0.5)
HEMATOCRIT: 33.8 % — AB (ref 34.8–46.6)
HGB: 11 g/dL — ABNORMAL LOW (ref 11.6–15.9)
LYMPH%: 19.1 % (ref 14.0–49.7)
MCH: 32.4 pg (ref 25.1–34.0)
MCHC: 32.5 g/dL (ref 31.5–36.0)
MCV: 99.8 fL (ref 79.5–101.0)
MONO#: 0.4 10*3/uL (ref 0.1–0.9)
MONO%: 13.1 % (ref 0.0–14.0)
NEUT#: 1.9 10*3/uL (ref 1.5–6.5)
NEUT%: 62.3 % (ref 38.4–76.8)
PLATELETS: 134 10*3/uL — AB (ref 145–400)
RBC: 3.38 10*6/uL — ABNORMAL LOW (ref 3.70–5.45)
RDW: 14 % (ref 11.2–14.5)
WBC: 3.1 10*3/uL — ABNORMAL LOW (ref 3.9–10.3)
lymph#: 0.6 10*3/uL — ABNORMAL LOW (ref 0.9–3.3)

## 2013-08-11 ENCOUNTER — Other Ambulatory Visit: Payer: Self-pay | Admitting: *Deleted

## 2013-08-11 DIAGNOSIS — C9000 Multiple myeloma not having achieved remission: Secondary | ICD-10-CM

## 2013-08-11 DIAGNOSIS — D472 Monoclonal gammopathy: Secondary | ICD-10-CM

## 2013-08-11 NOTE — Telephone Encounter (Signed)
THIS REFILL REQUEST FOR REVLIMID WAS GIVEN TO DR.CHISM'S NURSE, ROBIN BASS,RN.

## 2013-08-12 ENCOUNTER — Telehealth: Payer: Self-pay

## 2013-08-12 DIAGNOSIS — Z9484 Stem cells transplant status: Secondary | ICD-10-CM | POA: Diagnosis not present

## 2013-08-12 DIAGNOSIS — C9000 Multiple myeloma not having achieved remission: Secondary | ICD-10-CM | POA: Diagnosis not present

## 2013-08-12 DIAGNOSIS — Z23 Encounter for immunization: Secondary | ICD-10-CM | POA: Diagnosis not present

## 2013-08-12 MED ORDER — LENALIDOMIDE 10 MG PO CAPS: 10.0000 mg | ORAL_CAPSULE | Freq: Every day | ORAL | Status: DC

## 2013-08-12 NOTE — Addendum Note (Signed)
Addended by: Wyonia Hough on: 08/12/2013 11:05 AM   Modules accepted: Orders

## 2013-08-12 NOTE — Telephone Encounter (Signed)
samantha from [employer] called to verify rx change on revlimid from daily to 21days on 7 days off. Confirmed with Dr Juliann Mule and called [facility] that yes pt is to be 21 days on and 7 days off at the present

## 2013-08-19 NOTE — Telephone Encounter (Signed)
RECEIVED A FAX FROM [facility] CONCERNING A CONFIRMATION OF PRESCRIPTION SHIPMENT FOR REVLIMID ON 08/18/13.

## 2013-08-22 ENCOUNTER — Ambulatory Visit (HOSPITAL_BASED_OUTPATIENT_CLINIC_OR_DEPARTMENT_OTHER): Payer: Medicare Other | Admitting: Internal Medicine

## 2013-08-22 ENCOUNTER — Telehealth: Payer: Self-pay | Admitting: Internal Medicine

## 2013-08-22 ENCOUNTER — Other Ambulatory Visit (HOSPITAL_BASED_OUTPATIENT_CLINIC_OR_DEPARTMENT_OTHER): Payer: Medicare Other

## 2013-08-22 VITALS — BP 154/60 | HR 75 | Temp 99.2°F | Resp 20 | Ht 65.0 in | Wt 198.8 lb

## 2013-08-22 DIAGNOSIS — C9000 Multiple myeloma not having achieved remission: Secondary | ICD-10-CM | POA: Diagnosis not present

## 2013-08-22 DIAGNOSIS — C7A090 Malignant carcinoid tumor of the bronchus and lung: Secondary | ICD-10-CM | POA: Diagnosis not present

## 2013-08-22 DIAGNOSIS — D472 Monoclonal gammopathy: Secondary | ICD-10-CM

## 2013-08-22 DIAGNOSIS — D72819 Decreased white blood cell count, unspecified: Secondary | ICD-10-CM

## 2013-08-22 DIAGNOSIS — Z902 Acquired absence of lung [part of]: Secondary | ICD-10-CM

## 2013-08-22 LAB — CBC WITH DIFFERENTIAL/PLATELET
BASO%: 1.5 % (ref 0.0–2.0)
Basophils Absolute: 0 10e3/uL (ref 0.0–0.1)
EOS%: 7.7 % — ABNORMAL HIGH (ref 0.0–7.0)
Eosinophils Absolute: 0.2 10e3/uL (ref 0.0–0.5)
HCT: 34.8 % (ref 34.8–46.6)
HGB: 11.4 g/dL — ABNORMAL LOW (ref 11.6–15.9)
LYMPH%: 25.1 % (ref 14.0–49.7)
MCH: 32.1 pg (ref 25.1–34.0)
MCHC: 32.7 g/dL (ref 31.5–36.0)
MCV: 98.4 fL (ref 79.5–101.0)
MONO#: 0.3 10e3/uL (ref 0.1–0.9)
MONO%: 10.8 % (ref 0.0–14.0)
NEUT#: 1.6 10e3/uL (ref 1.5–6.5)
NEUT%: 54.9 % (ref 38.4–76.8)
Platelets: 160 10e3/uL (ref 145–400)
RBC: 3.53 10e6/uL — ABNORMAL LOW (ref 3.70–5.45)
RDW: 14.2 % (ref 11.2–14.5)
WBC: 2.8 10e3/uL — ABNORMAL LOW (ref 3.9–10.3)
lymph#: 0.7 10e3/uL — ABNORMAL LOW (ref 0.9–3.3)

## 2013-08-22 LAB — COMPREHENSIVE METABOLIC PANEL (CC13)
ALT: 35 U/L (ref 0–55)
AST: 24 U/L (ref 5–34)
Albumin: 3.7 g/dL (ref 3.5–5.0)
Alkaline Phosphatase: 107 U/L (ref 40–150)
Anion Gap: 10 mEq/L (ref 3–11)
BUN: 17 mg/dL (ref 7.0–26.0)
CALCIUM: 9.7 mg/dL (ref 8.4–10.4)
CO2: 24 mEq/L (ref 22–29)
CREATININE: 0.8 mg/dL (ref 0.6–1.1)
Chloride: 109 mEq/L (ref 98–109)
Glucose: 119 mg/dl (ref 70–140)
Potassium: 4.1 mEq/L (ref 3.5–5.1)
Sodium: 142 mEq/L (ref 136–145)
Total Bilirubin: 0.56 mg/dL (ref 0.20–1.20)
Total Protein: 6.6 g/dL (ref 6.4–8.3)

## 2013-08-22 NOTE — Progress Notes (Signed)
[facility] OFFICE PROGRESS NOTE  [name], MD [address] [place] [address]  DIAGNOSIS: Multiple myeloma, without mention of having achieved remission(203.00) - Plan: CBC with Differential, Basic metabolic panel (Bmet) - [facility]  Monoclonal gammopathy  Status post lobectomy of lung  Chief Complaint  Patient presents with  . Multiple myeloma, without mention of having achieved remissi    CURRENT TREATMENT: Revlimid 10 mg daily for 21 days on and then 7 days off started on 07/22/2013.     Multiple myeloma, without mention of having achieved remission(203.00)   05/27/2012 Initial Diagnosis Multiple myeloma, without mention of having achieved remission(203.00)   05/27/2012 Cancer Staging 1.  IgG kappa multiple myeloma. She presented in 05/2012 with anemia, Cr 0.8, Ca 9.6; no bone lytic lesion; LDH 140; beta2- microglobulin 1.86 (ref 1.01-1.73). Positive SPEP for M-spike at 1.69, IgG 2080, kappa 7.24, kappa:lambda ratio 24.97; UPEP positive   06/08/2012 Bone Marrow Biopsy Bone marrow biopsy on 06/08/12 showed 19% plasma cell; normal cytogenetics; myeloma FISH pending.    06/14/2012 Imaging Skeletal survey negative for bone lytic lesions.  Lung nodule apparent.   09/17/2012 Bone Marrow Biopsy Bone marrow biopsy showed 11% plasma cells.   10/18/2012 - 12/31/2012 Chemotherapy Started on VRD (Velcade 1.3 mg/m^2 [place] days 1,8 and 15; Revlimide 25 mg PO daily, on day 1 through 14; Decadron 40 mg PO Days 1, 8, 15).   Started on coumadin given RD.   01/14/2013 Bone Marrow Biopsy The bone marrow clot sections are normocellular toslightly hypercellular for age (approximately 40% overall). 1% plasma cells.   02/08/2013 Bone Marrow Transplant Auto Transplant at [facility] (Dr. Glorianne Manchester McIver).    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 Lamonte Sakai)    07/08/2012 Pathology Results Pathology consistent with Non-small cell lung cancer of RLL.    07/14/2012 Imaging 1. Right lower lobe nodule is non hypermetabolic which suggests benign etiology considering the slow growing rate in 6 years or non FDG avid disease as seen with carcinoid tumors. Pending biopsy. No PET evidence of mediastinal nodal disease.    08/02/2012 Imaging MRI of brain negative.   08/12/2012 Surgery Robotic Lobectomy of R Lower lobe.    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     Surgical resection margins are negative.     Two benign lymph nodes.     pTa1N0   INTERVAL HISTORY: [name] 68 y.o. female returns for her follow-up. She was last seen on 07/22/2013.  She is status post SCT on 02/08/2013 as noted above.  She is doing fine.  She saw Dr. Harvel Ricks on 08/19/2013.  He said everything appeared fine and to follow up in 1 month to start her immunizations.  In 2 month, surgery plans to  repeat CT of chest with Dr. Bea Laura.  On May 8th, she saw the cardiologist (Dr. Beau Fanny).  She weighs 198 lbs stable from 198 lbs her last visit.  Her energy is slowly improving.   She is independent of all activities of daily living. Her second cycle started on 08/20/2013.  She is taking revlimid 10 mg daily for 21 days and then off for 7 days.    Patient denies anorexia, headache, visual changes, confusion, drenching night sweats, palpable lymph node swelling, mucositis, odynophagia, dysphagia, jaundice,  chest pain, palpitation, occasional shortness of breath, dyspnea on exertion, productive cough, gum bleeding, epistaxis, hematemesis, hemoptysis, abdominal pain, abdominal swelling, early satiety, melena, hematochezia, hematuria, skin rash, spontaneous bleeding, joint swelling, joint pain, heat or cold intolerance, bowel bladder incontinence, back pain, focal motor weakness, paresthesia.    MEDICAL HISTORY: Past Medical  History  Diagnosis Date  . Osteoporosis   . Hypercholesterolemia   . Esophageal reflux disease   . Hypertension   . Diabetes type 2, controlled   . Pancytopenia   . Ischemic colitis 2009  . Lung nodule 06/11/2012  . Depression   . Sleep apnea     UPPER AIRWAY RESISTANCE  DOES NOT HAVE CPAP , MAY NEED   . Tuberculosis      (+ TB TEST)YEARS AGO POSSIBLE EXPOSURE WAS MED TX   . Multiple myeloma 05/2012    Cytogenetics on 06/08/2012 was normal 67, XX [20]  . 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    ALLERGIES:  has No Known Allergies.  MEDICATIONS: has a current medication list which includes the following prescription(s): acyclovir, aspirin, bupropion, esomeprazole, folic acid, lenalidomide, losartan, magnesium chloride, metoprolol succinate, multivitamin with minerals, preservision areds 2, sertraline, simvastatin, sulfamethoxazole-trimethoprim, and gabapentin.  SURGICAL HISTORY:  Past Surgical History  Procedure Laterality Date  . Colonoscopy  2013    poly; Dr. Oletta Lamas, next one due 2018.l  . Appendectomy    . Tonsillectomy    . Leg surgery    . Video bronchoscopy with endobronchial navigation Right 07/08/2012    Procedure: VIDEO BRONCHOSCOPY WITH ENDOBRONCHIAL NAVIGATION;  Surgeon: Collene Gobble, MD;  Location: [facility];  Service: Thoracic;  Laterality: Right;    REVIEW OF SYSTEMS:   Constitutional: Denies fevers, chills or abnormal weight loss Eyes: Denies blurriness of vision Ears, nose, mouth, throat, and face: Denies mucositis or sore  throat Respiratory: Denies cough, dyspnea or wheezes Cardiovascular: Denies palpitation, chest discomfort or lower extremity swelling Gastrointestinal:  Denies nausea, heartburn or change in bowel habits Skin: Denies abnormal skin rashes Lymphatics: Denies new lymphadenopathy or easy bruising Neurological:Denies numbness, tingling or new weaknesses Behavioral/Psych: Mood is stable, no new changes  All other systems were reviewed with the patient and are negative.  PHYSICAL EXAMINATION: ECOG PERFORMANCE STATUS: 0 - Asymptomatic  Blood pressure 154/60, pulse 75, temperature 99.2 F (37.3 C), temperature source Oral, resp. rate 20, height _0  (1.651 m), weight 198 lb 12.8 oz (90.175 kg).  GENERAL:alert, no distress and comfortable; well developed and well nourished.  Hair growing back.  SKIN: skin color, texture, turgor are normal, no rashes or significant lesions EYES: normal, Conjunctiva are pink and non-injected, sclera clear OROPHARYNX:no exudate, no erythema and lips, buccal mucosa, and tongue normal  NECK: supple, thyroid normal size, non-tender, without nodularity LYMPH:  no palpable lymphadenopathy in the cervical, axillary or supraclavicular LUNGS: clear to auscultation with normal breathing effort, no wheezes or rhonchi HEART: regular rate & rhythm and no murmurs and no lower extremity edema ABDOMEN:abdomen soft, non-tender and normal bowel sounds Musculoskeletal:no cyanosis of digits and no clubbing  NEURO: alert & oriented x 3 with fluent speech, no focal motor/sensory deficits  Labs:  Lab Results  Component Value Date   WBC 2.8* 08/22/2013   HGB 11.4* 08/22/2013   HCT 34.8 08/22/2013   MCV 98.4 08/22/2013   PLT 160 08/22/2013   NEUTROABS 1.6 08/22/2013      Chemistry      Component Value Date/Time   NA 142 08/22/2013 1449   NA 139 07/07/2012 1353   K 4.1 08/22/2013 1449   K 4.2 07/07/2012 1353   CL 103 07/07/2012 1353   CL 105 06/08/2012 0913   CO2 24 08/22/2013 1449    CO2 27 07/07/2012 1353   BUN 17.0 08/22/2013 1449   BUN 17 07/07/2012 1353   CREATININE 0.8 08/22/2013 1449   CREATININE 0.65 07/07/2012 1353      Component Value Date/Time   CALCIUM 9.7 08/22/2013 1449   CALCIUM 10.1 07/07/2012 1353   ALKPHOS 107 08/22/2013 1449   ALKPHOS 94 07/07/2012 1353   AST 24 08/22/2013 1449   AST 38* 07/07/2012 1353   ALT 35 08/22/2013 1449   ALT 43* 07/07/2012 1353   BILITOT 0.56 08/22/2013 1449   BILITOT 0.4 07/07/2012 1353     CBC:  Recent Labs Lab 08/22/13 1450  WBC 2.8*  NEUTROABS 1.6  HGB 11.4*  HCT 34.8  MCV 98.4  PLT 160   Studies:  No results found.   RADIOGRAPHIC STUDIES: No results found.  ASSESSMENT: [name] 68 y.o. female with a history of Multiple myeloma, without mention of having achieved remission(203.00) - Plan: CBC with Differential, Basic metabolic panel (Bmet) - [facility]  Monoclonal gammopathy  Status post lobectomy of lung   PLAN:   1. IgG Kappa MM s/p autologous SCT (02/08/2013).  -- She completed 4 cycles of VRd and had SCT at [facility]. She is continuing to recover her counts and her energy is improving. We followed her counts and her WBC and Plts are recovered.  -- She will continue acyclovir and bactrim per protocol.  --We will implement vaccinations per protocol.  This was started on July 24th, 2015 at [facility].  She will require her 12 month post transplant shots including Tdap, HIB, IPOL, Hepatitis B energex B 20 mcg/ml and Prevnar 13) some time in January.  -- We recommended continuing maintenance therapy with revlimid 10 mg daily for 21 days on followed by 7 days off. She has the revlimid at her home but has not started it. This is a catergory one recommendation based on The CALG 100104 trial demonstrating an improved median time to progression (46 months for lenalidomide group versus 27 months for placebo) and disease progression or death (37% versus 58% repectively). However, second primary cancers occurred more frequently  with the lenalidomide group (8% versus 3% in placebo) and increased with of severe neutropenia.   She will continue low dose ASA with revlimid.   2. 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  --1.7cm on final pathology from [facility]. 0/8 lymph nodes were negative. Normal margins. Grade G1.   3. Leukopenia (mild),Anemia secondary to #1.  --She is asymptomatic.    4. Follow-up. She will have repeat counts a close follow in one month.  All questions were answered. The patient knows to call the clinic with any problems, questions or concerns. We can certainly see the patient much sooner if necessary.  I spent 10 minutes counseling the patient face to face. The total time spent in the appointment was 15 minutes.    Talyssa Gibas, MD 08/22/2013 3:49 PM

## 2013-08-22 NOTE — Telephone Encounter (Signed)
gv pt appt schedule for aug.

## 2013-09-05 NOTE — Telephone Encounter (Signed)
Phone call - encounter closed. 

## 2013-09-09 ENCOUNTER — Other Ambulatory Visit: Payer: Self-pay | Admitting: Medical Oncology

## 2013-09-09 ENCOUNTER — Other Ambulatory Visit: Payer: Self-pay | Admitting: *Deleted

## 2013-09-09 DIAGNOSIS — D472 Monoclonal gammopathy: Secondary | ICD-10-CM

## 2013-09-09 DIAGNOSIS — C9000 Multiple myeloma not having achieved remission: Secondary | ICD-10-CM

## 2013-09-09 MED ORDER — LENALIDOMIDE 10 MG PO CAPS: 10.0000 mg | ORAL_CAPSULE | Freq: Every day | ORAL | Status: DC

## 2013-09-09 NOTE — Telephone Encounter (Signed)
THIS REFILL REQUEST FOR REVLIMID WAS GIVEN TO DR.CHISM'S NURSE, KATHY BUYCK,RN.

## 2013-09-12 NOTE — Telephone Encounter (Signed)
RECEIVED A FAX FROM [facility] CONCERNING A CONFIRMATION OF FACSIMILE RECEIPT FOR PT. REFERRAL. 

## 2013-09-15 NOTE — Telephone Encounter (Signed)
RECEIVED A FAX FROM [facility] CONCERNING A CONFIRMATION OF PRESCRIPTION SHIPMENT FOR REVLIMID.

## 2013-09-26 ENCOUNTER — Ambulatory Visit (HOSPITAL_BASED_OUTPATIENT_CLINIC_OR_DEPARTMENT_OTHER): Payer: Medicare Other | Admitting: Hematology

## 2013-09-26 ENCOUNTER — Other Ambulatory Visit (HOSPITAL_BASED_OUTPATIENT_CLINIC_OR_DEPARTMENT_OTHER): Payer: Medicare Other

## 2013-09-26 VITALS — BP 145/76 | HR 71 | Temp 97.6°F | Resp 20 | Ht 65.0 in | Wt 200.6 lb

## 2013-09-26 DIAGNOSIS — C7A090 Malignant carcinoid tumor of the bronchus and lung: Secondary | ICD-10-CM

## 2013-09-26 DIAGNOSIS — E119 Type 2 diabetes mellitus without complications: Secondary | ICD-10-CM | POA: Diagnosis not present

## 2013-09-26 DIAGNOSIS — D72819 Decreased white blood cell count, unspecified: Secondary | ICD-10-CM | POA: Diagnosis not present

## 2013-09-26 DIAGNOSIS — C9000 Multiple myeloma not having achieved remission: Secondary | ICD-10-CM

## 2013-09-26 DIAGNOSIS — C9001 Multiple myeloma in remission: Secondary | ICD-10-CM

## 2013-09-26 DIAGNOSIS — D630 Anemia in neoplastic disease: Secondary | ICD-10-CM

## 2013-09-26 DIAGNOSIS — Z859 Personal history of malignant neoplasm, unspecified: Secondary | ICD-10-CM | POA: Diagnosis not present

## 2013-09-26 LAB — CBC WITH DIFFERENTIAL/PLATELET
BASO%: 0.4 % (ref 0.0–2.0)
Basophils Absolute: 0 10*3/uL (ref 0.0–0.1)
EOS%: 7.7 % — ABNORMAL HIGH (ref 0.0–7.0)
Eosinophils Absolute: 0.2 10*3/uL (ref 0.0–0.5)
HCT: 32.7 % — ABNORMAL LOW (ref 34.8–46.6)
HGB: 10.6 g/dL — ABNORMAL LOW (ref 11.6–15.9)
LYMPH%: 24.5 % (ref 14.0–49.7)
MCH: 31.7 pg (ref 25.1–34.0)
MCHC: 32.4 g/dL (ref 31.5–36.0)
MCV: 97.9 fL (ref 79.5–101.0)
MONO#: 0.2 10*3/uL (ref 0.1–0.9)
MONO%: 8.4 % (ref 0.0–14.0)
NEUT%: 59 % (ref 38.4–76.8)
NEUTROS ABS: 1.6 10*3/uL (ref 1.5–6.5)
Platelets: 162 10*3/uL (ref 145–400)
RBC: 3.34 10*6/uL — AB (ref 3.70–5.45)
RDW: 14.1 % (ref 11.2–14.5)
WBC: 2.7 10*3/uL — ABNORMAL LOW (ref 3.9–10.3)
lymph#: 0.7 10*3/uL — ABNORMAL LOW (ref 0.9–3.3)

## 2013-09-26 LAB — BASIC METABOLIC PANEL (CC13)
ANION GAP: 10 meq/L (ref 3–11)
BUN: 12.4 mg/dL (ref 7.0–26.0)
CO2: 24 mEq/L (ref 22–29)
Calcium: 9.2 mg/dL (ref 8.4–10.4)
Chloride: 110 mEq/L — ABNORMAL HIGH (ref 98–109)
Creatinine: 0.8 mg/dL (ref 0.6–1.1)
Glucose: 181 mg/dl — ABNORMAL HIGH (ref 70–140)
Potassium: 3.8 mEq/L (ref 3.5–5.1)
SODIUM: 144 meq/L (ref 136–145)

## 2013-09-26 NOTE — Progress Notes (Signed)
[facility] OFFICE PROGRESS NOTE  [name], MD [address] [place] [address]  DIAGNOSIS: Multiple Myeloma and Carcinoid tumor of lung.  REASON FOR OFFICE VISIT: Follow up.  CURRENT TREATMENT: Revlimid 10 mg daily for 21 days on and then 7 days off started on 07/22/2013.     Multiple myeloma, without mention of having achieved remission(203.00)   05/27/2012 Initial Diagnosis Multiple myeloma, without mention of having achieved remission(203.00)   05/27/2012 Cancer Staging 1.  IgG kappa multiple myeloma. She presented in 05/2012 with anemia, Cr 0.8, Ca 9.6; no bone lytic lesion; LDH 140; beta2- microglobulin 1.86 (ref 1.01-1.73). Positive SPEP for M-spike at 1.69, IgG 2080, kappa 7.24, kappa:lambda ratio 24.97; UPEP positive   06/08/2012 Bone Marrow Biopsy Bone marrow biopsy on 06/08/12 showed 19% plasma cell; normal cytogenetics; myeloma FISH pending.    06/14/2012 Imaging Skeletal survey negative for bone lytic lesions.  Lung nodule apparent.   09/17/2012 Bone Marrow Biopsy Bone marrow biopsy showed 11% plasma cells.   10/18/2012 - 12/31/2012 Chemotherapy Started on VRD (Velcade 1.3 mg/m^2 [place] days 1,8 and 15; Revlimide 25 mg PO daily, on day 1 through 14; Decadron 40 mg PO Days 1, 8, 15).   Started on coumadin given RD.   01/14/2013 Bone Marrow Biopsy The bone marrow clot sections are normocellular toslightly hypercellular for age (approximately 40% overall). 1% plasma cells.   02/08/2013 Bone Marrow Transplant Auto Transplant at [facility] (Dr. Glorianne Manchester McIver).    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    07/14/2012 Imaging 1. Right lower lobe nodule is non hypermetabolic which suggests benign etiology considering the slow growing rate in  6 years or non FDG avid disease as seen with carcinoid tumors. Pending biopsy. No PET evidence of mediastinal nodal disease.    08/02/2012 Imaging MRI of brain negative.   08/12/2012 Surgery Robotic Lobectomy of R Lower lobe.    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     Surgical resection margins are negative.     Two benign lymph nodes.     pTa1N0   INTERVAL HISTORY: [name] 68 y.o. female returns for her follow-up. She was last seen on 08/22/2013 by Dr Juliann Mule.  She is status post SCT on 02/08/2013 as noted above.  She is doing fine.  She saw Dr. Harvel Ricks on 08/19/2013 and has another follow up coming in October 2015.  He said everything appeared fine and to follow up  to start her immunizations.  In 1 month, surgery plans to  repeat CT of chest with Dr. Bea Laura.  Her energy is slowly improving.   She is independent of all activities of daily living. Her third cycle of Revlimid started on 09/17/2013.  She is taking revlimid 10 mg daily for 21 days and then off for 7 days.  She is now off Bactrim but continues to take Acyclovir. She also used to take metformin for 3 years but stopped that at time of transplant. Looking at her blood sugars here she is running high and I asked her to resume her metformin 500 mg daily and also bring this to notice of her PCP and get hemoglobin A1C checked. Today her  fasting sugar when she checked was 116. Blood glucose here was 181.       Patient denies anorexia, headache, visual changes, confusion, drenching night sweats, palpable lymph node swelling, mucositis, odynophagia, dysphagia, jaundice, chest pain, palpitation, occasional shortness of breath, dyspnea on exertion, productive cough, gum bleeding, epistaxis, hematemesis, hemoptysis, abdominal pain, abdominal swelling, early satiety, melena, hematochezia, hematuria, skin rash, spontaneous bleeding, joint swelling, joint pain, heat or  cold intolerance, bowel bladder incontinence, back pain, focal motor weakness, paresthesia. She continues to take an aspirin daily 325 mg. Her 4th cycle of Revlimid will start on 10/11/2013.   MEDICAL HISTORY: Past Medical History  Diagnosis Date  . Osteoporosis   . Hypercholesterolemia   . Esophageal reflux disease   . Hypertension   . Diabetes type 2, controlled   . Pancytopenia   . Ischemic colitis 2009  . Lung nodule 06/11/2012  . Depression   . Sleep apnea     UPPER AIRWAY RESISTANCE  DOES NOT HAVE CPAP , MAY NEED   . Tuberculosis      (+ TB TEST)YEARS AGO POSSIBLE EXPOSURE WAS MED TX   . Multiple myeloma 05/2012    Cytogenetics on 06/08/2012 was normal 40, XX [20]  . 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    ALLERGIES:  has No Known Allergies.  MEDICATIONS: has a current medication list which includes the following prescription(s): acyclovir, aspirin, bupropion, esomeprazole, lenalidomide, losartan, magnesium chloride, metoprolol succinate, multivitamin with minerals, preservision areds 2, sertraline, and simvastatin.  SURGICAL HISTORY:  Past Surgical History  Procedure Laterality Date  . Colonoscopy  2013    poly; Dr. Oletta Lamas, next one due 2018.l  . Appendectomy    . Tonsillectomy    . Leg surgery    . Video bronchoscopy with endobronchial navigation Right 07/08/2012    Procedure: VIDEO BRONCHOSCOPY WITH ENDOBRONCHIAL NAVIGATION;  Surgeon: Collene Gobble, MD;  Location: [facility];  Service: Thoracic;  Laterality: Right;    REVIEW OF SYSTEMS:    Constitutional: Denies fevers, chills or abnormal weight loss, blood sugars running high. Eyes: Denies blurriness of vision Ears, nose, mouth, throat, and face: Denies mucositis or sore throat Respiratory: Denies cough, dyspnea or wheezes Cardiovascular: Denies palpitation, chest discomfort or lower extremity swelling Gastrointestinal:  Denies nausea, heartburn or change in bowel habits Skin: Denies abnormal skin rashes Lymphatics: Denies new lymphadenopathy or easy bruising Neurological:Denies numbness, tingling or new weaknesses Behavioral/Psych: Mood is stable, no new changes  All other systems were reviewed with the patient and are negative.  PHYSICAL EXAMINATION: ECOG PERFORMANCE STATUS: 0 - Asymptomatic  Blood pressure 145/76, pulse 71, temperature 97.6 F (36.4 C), temperature source Oral, resp. rate 20, height _0  (1.651 m), weight 200 lb 9.6 oz (90.992 kg).  GENERAL:alert, no distress and comfortable; well developed and well nourished.  Hair growing back.  SKIN: skin color, texture, turgor are normal, no rashes or significant lesions EYES: normal, Conjunctiva are pink and non-injected, sclera clear OROPHARYNX:no exudate, no erythema and lips, buccal mucosa, and tongue normal  NECK: supple, thyroid normal size, non-tender, without nodularity LYMPH:  no palpable lymphadenopathy in the cervical, axillary or supraclavicular LUNGS:  clear to auscultation with normal breathing effort, no wheezes or rhonchi HEART: regular rate & rhythm and no murmurs and no lower extremity edema ABDOMEN:abdomen soft, non-tender and normal bowel sounds Musculoskeletal:no cyanosis of digits and no clubbing  NEURO: alert & oriented x 3 with fluent speech, no focal motor/sensory deficits  Labs:        RADIOGRAPHIC STUDIES: No results found.  ASSESSMENT: [name] 68 y.o. female with a history of Multiple Myeloma and Pulmonary Carcinoid.   PLAN:   1. IgG Kappa MM s/p autologous SCT  (02/08/2013).  -- She completed 4 cycles of VRd and had SCT at [facility]. She is continuing to recover her counts and her energy is improving. We followed her counts and her WBC and Plts are recovered.  -- She will continue acyclovir and bactrim has been stopped as per protocol.  --We will implement vaccinations per protocol.  This was started on July 24th, 2015 at [facility].  She will require her 12 month post transplant shots including Tdap, HIB, IPOL, Hepatitis B energex B 20 mcg/ml and Prevnar 13) some time in January.  -- We recommended continuing maintenance therapy with Revlimid 10 mg daily for 21 days on followed by 7 days off. This is a catergory one recommendation based on The CALG 100104 trial demonstrating an improved median time to progression (46 months for lenalidomide group versus 27 months for placebo) and disease progression or death (37% versus 58% repectively). However, second primary cancers occurred more frequently with the lenalidomide group (8% versus 3% in placebo) and increased with of severe neutropenia.   She will continue full dose ASA with revlimid.  -- she is on her third cycle of Revlimid which was started on 09/17/2013 and her fourth cycle will start on 10/11/2013.  2. 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  --1.7cm on final pathology from [facility]. 0/8 lymph nodes were negative. Normal margins. Grade G1.  --Check serum Chromogranin marker as part of her monthly labs. -- She has a scan of chest and follow up coming next month for her resected carcinoid tumor with Dr Bea Laura.  3. Leukopenia (mild),Anemia secondary to #1.  --She is asymptomatic.  Continue to monitor.  4. Diabetes Type 2: Patient took herself off Metformin and consequently is showing high values for her non-fasting sugar testing. I recommended for her to resume her Metformin and also to check with her PCP.   Follow-up. She will have repeat counts a close follow  in one month.  All questions were answered. The patient knows to call the clinic with any problems, questions or concerns. We can certainly see the patient much sooner if necessary.  I spent 30 minutes counseling the patient face to face. The total time spent in the appointment was 30 minutes.    Bernadene Bell, MD Medical Hematologist/Oncologist [facility] Pager: [phone] Office No: [phone]

## 2013-09-28 ENCOUNTER — Telehealth: Payer: Self-pay | Admitting: Hematology

## 2013-09-28 ENCOUNTER — Encounter: Payer: Self-pay | Admitting: Hematology

## 2013-09-28 NOTE — Progress Notes (Signed)
Received letter from Patient [employer].  Pt is approved for Revlimid and Velcade or any medication related to MM from 10/14/13 to 10/14/14 or when benefit cap has been met.  The amount of the grant is $10,000.  I will send [facility] w/ billing a copy of letter.

## 2013-09-28 NOTE — Telephone Encounter (Signed)
, °

## 2013-10-06 ENCOUNTER — Inpatient Hospital Stay (HOSPITAL_COMMUNITY)
Admission: EM | Admit: 2013-10-06 | Discharge: 2013-10-13 | DRG: 871 | Disposition: A | Discharge status: Home / Self Care | Payer: Medicare Other | Attending: Internal Medicine | Admitting: Internal Medicine

## 2013-10-06 ENCOUNTER — Encounter (HOSPITAL_COMMUNITY): Payer: Self-pay | Admitting: Emergency Medicine

## 2013-10-06 ENCOUNTER — Emergency Department (HOSPITAL_COMMUNITY): Payer: Medicare Other

## 2013-10-06 DIAGNOSIS — E860 Dehydration: Secondary | ICD-10-CM | POA: Diagnosis present

## 2013-10-06 DIAGNOSIS — Z859 Personal history of malignant neoplasm, unspecified: Secondary | ICD-10-CM | POA: Diagnosis not present

## 2013-10-06 DIAGNOSIS — M810 Age-related osteoporosis without current pathological fracture: Secondary | ICD-10-CM | POA: Diagnosis present

## 2013-10-06 DIAGNOSIS — R091 Pleurisy: Secondary | ICD-10-CM | POA: Diagnosis present

## 2013-10-06 DIAGNOSIS — K219 Gastro-esophageal reflux disease without esophagitis: Secondary | ICD-10-CM | POA: Diagnosis present

## 2013-10-06 DIAGNOSIS — R918 Other nonspecific abnormal finding of lung field: Secondary | ICD-10-CM | POA: Diagnosis not present

## 2013-10-06 DIAGNOSIS — R079 Chest pain, unspecified: Secondary | ICD-10-CM | POA: Diagnosis not present

## 2013-10-06 DIAGNOSIS — D61818 Other pancytopenia: Secondary | ICD-10-CM | POA: Diagnosis present

## 2013-10-06 DIAGNOSIS — E7800 Pure hypercholesterolemia, unspecified: Secondary | ICD-10-CM | POA: Diagnosis present

## 2013-10-06 DIAGNOSIS — R071 Chest pain on breathing: Secondary | ICD-10-CM

## 2013-10-06 DIAGNOSIS — E119 Type 2 diabetes mellitus without complications: Secondary | ICD-10-CM | POA: Diagnosis present

## 2013-10-06 DIAGNOSIS — A419 Sepsis, unspecified organism: Principal | ICD-10-CM | POA: Diagnosis present

## 2013-10-06 DIAGNOSIS — J189 Pneumonia, unspecified organism: Secondary | ICD-10-CM | POA: Diagnosis not present

## 2013-10-06 DIAGNOSIS — J9600 Acute respiratory failure, unspecified whether with hypoxia or hypercapnia: Secondary | ICD-10-CM | POA: Diagnosis not present

## 2013-10-06 DIAGNOSIS — Z8249 Family history of ischemic heart disease and other diseases of the circulatory system: Secondary | ICD-10-CM

## 2013-10-06 DIAGNOSIS — G4730 Sleep apnea, unspecified: Secondary | ICD-10-CM | POA: Diagnosis present

## 2013-10-06 DIAGNOSIS — R7989 Other specified abnormal findings of blood chemistry: Secondary | ICD-10-CM

## 2013-10-06 DIAGNOSIS — D72819 Decreased white blood cell count, unspecified: Secondary | ICD-10-CM | POA: Diagnosis present

## 2013-10-06 DIAGNOSIS — R5383 Other fatigue: Secondary | ICD-10-CM | POA: Diagnosis not present

## 2013-10-06 DIAGNOSIS — J810 Acute pulmonary edema: Secondary | ICD-10-CM | POA: Diagnosis not present

## 2013-10-06 DIAGNOSIS — I2789 Other specified pulmonary heart diseases: Secondary | ICD-10-CM | POA: Diagnosis present

## 2013-10-06 DIAGNOSIS — E785 Hyperlipidemia, unspecified: Secondary | ICD-10-CM | POA: Diagnosis present

## 2013-10-06 DIAGNOSIS — J9601 Acute respiratory failure with hypoxia: Secondary | ICD-10-CM | POA: Diagnosis present

## 2013-10-06 DIAGNOSIS — R072 Precordial pain: Secondary | ICD-10-CM | POA: Diagnosis not present

## 2013-10-06 DIAGNOSIS — Z87891 Personal history of nicotine dependence: Secondary | ICD-10-CM | POA: Diagnosis not present

## 2013-10-06 DIAGNOSIS — I10 Essential (primary) hypertension: Secondary | ICD-10-CM | POA: Diagnosis present

## 2013-10-06 DIAGNOSIS — R0602 Shortness of breath: Secondary | ICD-10-CM | POA: Diagnosis not present

## 2013-10-06 DIAGNOSIS — C9000 Multiple myeloma not having achieved remission: Secondary | ICD-10-CM | POA: Diagnosis not present

## 2013-10-06 DIAGNOSIS — R5381 Other malaise: Secondary | ICD-10-CM | POA: Diagnosis not present

## 2013-10-06 DIAGNOSIS — R404 Transient alteration of awareness: Secondary | ICD-10-CM | POA: Diagnosis not present

## 2013-10-06 DIAGNOSIS — D696 Thrombocytopenia, unspecified: Secondary | ICD-10-CM | POA: Diagnosis present

## 2013-10-06 DIAGNOSIS — R0789 Other chest pain: Secondary | ICD-10-CM

## 2013-10-06 DIAGNOSIS — J811 Chronic pulmonary edema: Secondary | ICD-10-CM | POA: Diagnosis not present

## 2013-10-06 DIAGNOSIS — Z902 Acquired absence of lung [part of]: Secondary | ICD-10-CM | POA: Diagnosis not present

## 2013-10-06 DIAGNOSIS — I369 Nonrheumatic tricuspid valve disorder, unspecified: Secondary | ICD-10-CM | POA: Diagnosis not present

## 2013-10-06 DIAGNOSIS — R0902 Hypoxemia: Secondary | ICD-10-CM | POA: Diagnosis not present

## 2013-10-06 DIAGNOSIS — N644 Mastodynia: Secondary | ICD-10-CM | POA: Diagnosis not present

## 2013-10-06 DIAGNOSIS — J984 Other disorders of lung: Secondary | ICD-10-CM | POA: Diagnosis not present

## 2013-10-06 DIAGNOSIS — R945 Abnormal results of liver function studies: Secondary | ICD-10-CM

## 2013-10-06 LAB — CREATININE, SERUM
Creatinine, Ser: 0.77 mg/dL (ref 0.50–1.10)
GFR calc Af Amer: >90 mL/min (ref >90)
GFR calc non Af Amer: 86 mL/min — ABNORMAL LOW (ref >90)

## 2013-10-06 LAB — CBC
HEMATOCRIT: 27.4 % — AB (ref 36.0–46.0)
Hemoglobin: 9.1 g/dL — ABNORMAL LOW (ref 12.0–15.0)
MCH: 31.4 pg (ref 26.0–34.0)
MCHC: 33.2 g/dL (ref 30.0–36.0)
MCV: 94.5 fL (ref 78.0–100.0)
Platelets: 137 10*3/uL — ABNORMAL LOW (ref 150–400)
RBC: 2.9 MIL/uL — AB (ref 3.87–5.11)
RDW: 14.2 % (ref 11.5–15.5)
WBC: 3.6 10*3/uL — ABNORMAL LOW (ref 4.0–10.5)

## 2013-10-06 LAB — I-STAT CHEM 8, ED
BUN: 12 mg/dL (ref 6–23)
CALCIUM ION: 1.05 mmol/L — AB (ref 1.13–1.30)
CREATININE: 0.9 mg/dL (ref 0.50–1.10)
Chloride: 104 mEq/L (ref 96–112)
GLUCOSE: 130 mg/dL — AB (ref 70–99)
HEMATOCRIT: 30 % — AB (ref 36.0–46.0)
Hemoglobin: 10.2 g/dL — ABNORMAL LOW (ref 12.0–15.0)
Potassium: 3.7 mEq/L (ref 3.7–5.3)
Sodium: 131 mEq/L — ABNORMAL LOW (ref 137–147)
TCO2: 23 mmol/L (ref 0–100)

## 2013-10-06 LAB — MRSA PCR SCREENING: MRSA BY PCR: NEGATIVE

## 2013-10-06 LAB — CBC WITH DIFFERENTIAL/PLATELET
BASOS ABS: 0 10*3/uL (ref 0.0–0.1)
Basophils Relative: 0 % (ref 0–1)
EOS ABS: 0 10*3/uL (ref 0.0–0.7)
Eosinophils Relative: 0 % (ref 0–5)
HEMATOCRIT: 30 % — AB (ref 36.0–46.0)
HEMOGLOBIN: 9.6 g/dL — AB (ref 12.0–15.0)
Lymphocytes Relative: 12 % (ref 12–46)
Lymphs Abs: 0.5 10*3/uL — ABNORMAL LOW (ref 0.7–4.0)
MCH: 31.2 pg (ref 26.0–34.0)
MCHC: 32 g/dL (ref 30.0–36.0)
MCV: 97.4 fL (ref 78.0–100.0)
MONO ABS: 0.6 10*3/uL (ref 0.1–1.0)
MONOS PCT: 13 % — AB (ref 3–12)
Neutro Abs: 3.4 10*3/uL (ref 1.7–7.7)
Neutrophils Relative %: 75 % (ref 43–77)
Platelets: 147 10*3/uL — ABNORMAL LOW (ref 150–400)
RBC: 3.08 MIL/uL — ABNORMAL LOW (ref 3.87–5.11)
RDW: 14.3 % (ref 11.5–15.5)
WBC: 4.6 10*3/uL (ref 4.0–10.5)

## 2013-10-06 LAB — GLUCOSE, CAPILLARY: GLUCOSE-CAPILLARY: 93 mg/dL (ref 70–99)

## 2013-10-06 LAB — TROPONIN I: Troponin I: <0.3 ng/mL (ref <0.30)

## 2013-10-06 LAB — I-STAT TROPONIN, ED: Troponin i, poc: 0.01 ng/mL (ref 0.00–0.08)

## 2013-10-06 LAB — PRO B NATRIURETIC PEPTIDE: Pro B Natriuretic peptide (BNP): 597.9 pg/mL — ABNORMAL HIGH (ref 0–125)

## 2013-10-06 LAB — TSH: TSH: 4 u[IU]/mL (ref 0.350–4.500)

## 2013-10-06 MED ORDER — DEXTROSE 5 % IV SOLN: 500.0000 mg | INTRAVENOUS | Status: DC

## 2013-10-06 MED ORDER — BUPROPION HCL ER (XL) 300 MG PO TB24
300.0000 mg | ORAL_TABLET | Freq: Every day | ORAL | Status: DC
  Administered 2013-10-07 – 2013-10-13 (×7): 300 mg via ORAL
  Filled 2013-10-06 (×7): qty 1

## 2013-10-06 MED ORDER — INSULIN ASPART 100 UNIT/ML ~~LOC~~ SOLN: 0.0000 [IU] | SUBCUTANEOUS | Status: DC

## 2013-10-06 MED ORDER — HYDROMORPHONE HCL PF 1 MG/ML IJ SOLN
0.5000 mg | INTRAMUSCULAR | Status: DC | PRN
  Administered 2013-10-06 – 2013-10-07 (×5): 1 mg via INTRAVENOUS
  Filled 2013-10-06 (×5): qty 1

## 2013-10-06 MED ORDER — SERTRALINE HCL 100 MG PO TABS
100.0000 mg | ORAL_TABLET | Freq: Every day | ORAL | Status: DC
  Administered 2013-10-07 – 2013-10-13 (×7): 100 mg via ORAL
  Filled 2013-10-06 (×8): qty 1

## 2013-10-06 MED ORDER — ASPIRIN 325 MG PO TABS
325.0000 mg | ORAL_TABLET | Freq: Every day | ORAL | Status: DC
  Administered 2013-10-07 – 2013-10-13 (×7): 325 mg via ORAL
  Filled 2013-10-06 (×7): qty 1

## 2013-10-06 MED ORDER — DEXTROSE 5 % IV SOLN: 1.0000 g | INTRAVENOUS | Status: DC

## 2013-10-06 MED ORDER — PANTOPRAZOLE SODIUM 40 MG PO TBEC
80.0000 mg | DELAYED_RELEASE_TABLET | Freq: Every day | ORAL | Status: DC
  Administered 2013-10-07: 80 mg via ORAL
  Filled 2013-10-06: qty 2

## 2013-10-06 MED ORDER — IOHEXOL 350 MG/ML SOLN
100.0000 mL | Freq: Once | INTRAVENOUS | Status: AC | PRN
  Administered 2013-10-06: 100 mL via INTRAVENOUS

## 2013-10-06 MED ORDER — FENTANYL CITRATE 0.05 MG/ML IJ SOLN
25.0000 ug | Freq: Once | INTRAMUSCULAR | Status: AC
  Administered 2013-10-06: 25 ug via INTRAVENOUS
  Filled 2013-10-06: qty 2

## 2013-10-06 MED ORDER — SIMVASTATIN 40 MG PO TABS
40.0000 mg | ORAL_TABLET | Freq: Every evening | ORAL | Status: DC
  Administered 2013-10-06 – 2013-10-13 (×8): 40 mg via ORAL
  Filled 2013-10-06 (×8): qty 1

## 2013-10-06 MED ORDER — LENALIDOMIDE 10 MG PO CAPS
10.0000 mg | ORAL_CAPSULE | Freq: Every day | ORAL | Status: DC
  Filled 2013-10-06: qty 1

## 2013-10-06 MED ORDER — LEVOFLOXACIN IN D5W 750 MG/150ML IV SOLN
750.0000 mg | Freq: Once | INTRAVENOUS | Status: DC
  Administered 2013-10-06: 750 mg via INTRAVENOUS
  Filled 2013-10-06: qty 150

## 2013-10-06 MED ORDER — IBUPROFEN 600 MG PO TABS: 600.0000 mg | ORAL_TABLET | Freq: Four times a day (QID) | ORAL | Status: DC | PRN

## 2013-10-06 MED ORDER — DEXTROSE 5 % IV SOLN
1.0000 g | Freq: Once | INTRAVENOUS | Status: DC
  Filled 2013-10-06: qty 10

## 2013-10-06 MED ORDER — DEXTROSE 5 % IV SOLN
500.0000 mg | INTRAVENOUS | Status: DC
  Filled 2013-10-06: qty 500

## 2013-10-06 MED ORDER — ACYCLOVIR 400 MG PO TABS
400.0000 mg | ORAL_TABLET | Freq: Two times a day (BID) | ORAL | Status: DC
  Administered 2013-10-06 – 2013-10-13 (×14): 400 mg via ORAL
  Filled 2013-10-06 (×19): qty 1

## 2013-10-06 MED ORDER — PRESERVISION AREDS 2 PO CAPS: 1.0000 | ORAL_CAPSULE | Freq: Two times a day (BID) | ORAL | Status: DC

## 2013-10-06 MED ORDER — FENTANYL CITRATE 0.05 MG/ML IJ SOLN
50.0000 ug | Freq: Once | INTRAMUSCULAR | Status: AC
  Administered 2013-10-06: 50 ug via INTRAVENOUS
  Filled 2013-10-06: qty 2

## 2013-10-06 MED ORDER — SODIUM CHLORIDE 0.9 % IV SOLN
INTRAVENOUS | Status: DC
  Administered 2013-10-06 – 2013-10-11 (×4): via INTRAVENOUS

## 2013-10-06 MED ORDER — ADULT MULTIVITAMIN W/MINERALS CH
1.0000 | ORAL_TABLET | Freq: Every day | ORAL | Status: DC
  Administered 2013-10-07 – 2013-10-13 (×7): 1 via ORAL
  Filled 2013-10-06 (×7): qty 1

## 2013-10-06 MED ORDER — DEXTROSE 5 % IV SOLN
1.0000 g | INTRAVENOUS | Status: DC
  Filled 2013-10-06: qty 10

## 2013-10-06 MED ORDER — HEPARIN SODIUM (PORCINE) 5000 UNIT/ML IJ SOLN
5000.0000 [IU] | Freq: Three times a day (TID) | INTRAMUSCULAR | Status: DC
  Administered 2013-10-06 – 2013-10-13 (×18): 5000 [IU] via SUBCUTANEOUS
  Filled 2013-10-06 (×26): qty 1

## 2013-10-06 MED ORDER — SODIUM CHLORIDE 0.9 % IV BOLUS (SEPSIS)
1000.0000 mL | Freq: Once | INTRAVENOUS | Status: AC
  Administered 2013-10-06: 1000 mL via INTRAVENOUS

## 2013-10-06 MED ORDER — DEXTROSE 5 % IV SOLN
500.0000 mg | Freq: Once | INTRAVENOUS | Status: DC
  Filled 2013-10-06: qty 500

## 2013-10-06 NOTE — ED Notes (Signed)
Patient transported to CT 

## 2013-10-06 NOTE — H&P (Addendum)
Hospitalist Admission History and Physical  Patient name: [name] record number: [id number] Date of birth: [birth year] Age: 68 y.o. Gender: female  Primary Care Provider: [name], MD  Chief Complaint: chest pain, CAP  History of Present Illness:This is a 68 y.o. 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 Pt states that she woke up yesterday with L sided chest pain that was fairly constant. Also worse with coughing and deep breathing. Denies any fever or shortness of breath. States that she had some mild sore throat/URI sxs that resolved last week. Has been compliant with medication regimen including oral chemotherapy. Reports that blood sugars have been stable. Denies any orthopne , PND. No nausea or diaphoresis. Presented to PCP's office about complaints. Was noted to be tachycardic and ? Hypoxic. Was directed to ER for rule out of PE.  On presentation to the ER, tmax 99.2, HR 60s-90s, RR 10s-20s, BP 90s-100s. Satting in mid 90s on RA. WBC 4.6, hgb around 10, Plt 140s, Cr WNL @ 0.9. Pro BNP mildly elevated at 597. EKG NSR. CTA negative for PE, but did show bilateral patchy airspace disease concerning for infiltrate. Also mildly enlarged lymph nodes around prior lobectomy-likely reactive.   Assessment and Plan: [name] is a 68 y.o. year old female presenting with chest pain, CAP, hypotension    Active Problems:   Chest wall pain   CAP (community acquired pneumonia)   Hypotension   1-Chest Pain/CAP  -CAP treatment with rocephin and azithromycin (no recent hospitalizations within past 3 months) -panculture -supplemental O2 prn  -+ pleuritic component of chest pain- fairly atypical for cardiac source  -will cycle CEs, risk stratification labs, 2D  ECHO given mildly elevated pro BNP.  -continue ASA  -pain control  -I/S -stepdown bed given LLN BPs   2-Hypotension  -LLN BPs on presentation in setting of antihypertensive use and above -asymptomatic  -HOLD BP regimen  -gently hydrate-seems to be responding to fluids  -consider cortisol level if BP persists LLN   3-Multiple myeloma -on maintenance revilimid per H-O recs  -continue while in house  -cont acyclovir -H-O c/s as clinically indicated   4-HTN -HOLD BP meds overnight   5-DM  -A1C, SSI  FEN/GI: heart healthy, carb modified diet  Prophylaxis: sub q heparin  Disposition: pending further evaluation  Code Status:Full Code    Patient Active Problem List   Diagnosis Date Noted  . Chest wall pain 10/06/2013  . CAP (community acquired pneumonia) 10/06/2013  . Status post lobectomy of lung 03/13/2013  . 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 Obesity 11/08/2012  . Multiple myeloma, without mention of having achieved remission(203.00) 10/11/2012  . Lung nodule 06/11/2012  . Monoclonal gammopathy   . HYPERLIPIDEMIA 05/17/2007  . HYPERTENSION 05/17/2007  . G E R D 05/17/2007  . OSTEOPOROSIS 05/17/2007  . PULMONARY NODULE, LEFT UPPER LOBE 05/13/2007  . Nonspecific (abnormal) findings on radiological and other examination of body structure 05/13/2007  . ABNORMAL RADIOLOGIAL EXAMINATION 05/13/2007   Past Medical History: Past Medical History  Diagnosis Date  . Osteoporosis   . Hypercholesterolemia   . Esophageal reflux disease   . Hypertension   . Diabetes type 2, controlled   . Pancytopenia   . Ischemic colitis 2009  .  Lung nodule 06/11/2012  . Depression   . Sleep apnea     UPPER AIRWAY RESISTANCE  DOES NOT HAVE CPAP , MAY NEED   . Tuberculosis      (+ TB TEST)YEARS AGO POSSIBLE EXPOSURE WAS MED TX   . Multiple myeloma 05/2012    Cytogenetics on 06/08/2012 was normal 2, XX [20]  . Liver enzyme elevation     Past Surgical  History: Past Surgical History  Procedure Laterality Date  . Colonoscopy  2013    poly; Dr. Oletta Lamas, next one due 2018.l  . Appendectomy    . Tonsillectomy    . Leg surgery    . Video bronchoscopy with endobronchial navigation Right 07/08/2012    Procedure: VIDEO BRONCHOSCOPY WITH ENDOBRONCHIAL NAVIGATION;  Surgeon: Collene Gobble, MD;  Location: [facility];  Service: Thoracic;  Laterality: Right;    Social History: History   Social History  . Marital Status: Divorced    Spouse Name: N/A    Number of Children: 2  . Years of Education: N/A   Occupational History  . retired     adm. [employer]   Social History Main Topics  . Smoking status: Former Smoker -- 1.00 packs/day for 30 years    Types: Cigarettes    Quit date: 01/27/2009  . Smokeless tobacco: Never Used  . Alcohol Use: 0.0 oz/week     Comment: 1 bottle per wine per night   . Drug Use: No  . Sexual Activity: None   Other Topics Concern  . None   Social History Narrative  . None    Family History: Family History  Problem Relation Age of Onset  . Heart attack Father   . Cancer Sister     synovial sarcoma  . Cancer Maternal Grandfather     ? cancer    Allergies: No Known Allergies  Current Facility-Administered Medications  Medication Dose Route Frequency Provider Last Rate Last Dose  . 0.9 %  sodium chloride infusion   Intravenous Continuous Shanda Howells, MD      . acyclovir (ZOVIRAX) tablet 400 mg  400 mg Oral BID Shanda Howells, MD      . aspirin tablet 325 mg  325 mg Oral Daily Shanda Howells, MD      . azithromycin (ZITHROMAX) 500 mg in dextrose 5 % 250 mL IVPB  500 mg Intravenous Once Tammy Sours, MD      . azithromycin (ZITHROMAX) 500 mg in dextrose 5 % 250 mL IVPB  500 mg Intravenous Q24H Shanda Howells, MD      . buPROPion (WELLBUTRIN XL) 24 hr tablet 300 mg  300 mg Oral Daily Shanda Howells, MD      . cefTRIAXone (ROCEPHIN) 1 g in dextrose 5 % 50 mL IVPB  1 g Intravenous Once Tammy Sours, MD      .  cefTRIAXone (ROCEPHIN) 1 g in dextrose 5 % 50 mL IVPB  1 g Intravenous Q24H Shanda Howells, MD      . heparin injection 5,000 Units  5,000 Units Subcutaneous 3 times per day Shanda Howells, MD      . lenalidomide (REVLIMID) capsule 10 mg  10 mg Oral Daily Shanda Howells, MD      . multivitamin with minerals tablet 1 tablet  1 tablet Oral Daily Shanda Howells, MD      . Derrill Memo ON 10/07/2013] pantoprazole (PROTONIX) EC tablet 80 mg  80 mg Oral Q1200 Shanda Howells, MD      . PRESERVISION AREDS 2  CAPS 1 capsule  1 capsule Oral BID Shanda Howells, MD      . sertraline (ZOLOFT) tablet 100 mg  100 mg Oral Daily Shanda Howells, MD      . simvastatin (ZOCOR) tablet 40 mg  40 mg Oral QPM Shanda Howells, MD      . [DISCONTINUED] levofloxacin [facility]) IVPB 750 mg  750 mg Intravenous Once Tammy Sours, MD 100 mL/hr at 10/06/13 1824 750 mg at 10/06/13 1824   Current Outpatient Prescriptions  Medication Sig Dispense Refill  . acyclovir (ZOVIRAX) 400 MG tablet Take 1 tablet (400 mg total) by mouth 2 (two) times daily.  60 tablet  0  . aspirin 325 MG tablet Take 325 mg by mouth daily.      Marland Kitchen buPROPion (WELLBUTRIN XL) 300 MG 24 hr tablet Take 300 mg by mouth daily.      Marland Kitchen esomeprazole (NEXIUM) 40 MG capsule Take 40 mg by mouth 2 (two) times daily.       Marland Kitchen lenalidomide (REVLIMID) 10 MG capsule Take 1 capsule (10 mg total) by mouth daily. TAKE ONE CAPSULE BY MOUTH FOR 21 DAYS AND 7 DAYS OFF.  21 capsule  0  . losartan (COZAAR) 50 MG tablet Take 50 mg by mouth daily.      . Magnesium Chloride 535 (64 MG) MG TBCR Take 64 mg by mouth 2 (two) times daily.       . metoprolol succinate (TOPROL-XL) 50 MG 24 hr tablet Take 50 mg by mouth daily. Take with or immediately following a meal.      . Multiple Vitamins-Minerals (MULTIVITAMIN WITH MINERALS) tablet Take 1 tablet by mouth daily.      . Multiple Vitamins-Minerals (PRESERVISION AREDS 2) CAPS Take 1 capsule by mouth 2 (two) times daily.      . sertraline (ZOLOFT) 100 MG  tablet Take 100 mg by mouth daily.      . simvastatin (ZOCOR) 40 MG tablet Take 40 mg by mouth every evening.      Marland Kitchen ibuprofen (ADVIL,MOTRIN) 600 MG tablet Take 1 tablet (600 mg total) by mouth every 6 (six) hours as needed.  30 tablet  0   Review Of Systems: 12 point ROS negative except as noted above in HPI.  Physical Exam: Filed Vitals:   10/06/13 1822  BP: 108/70  Pulse:   Temp:   Resp: 28    General: alert, cooperative and mildly obese HEENT: PERRLA and extra ocular movement intact Heart: S1, S2 normal, no murmur, rub or gallop, regular rate and rhythm Lungs: clear to auscultation, no wheezes or rales and unlabored breathing Abdomen: abdomen is soft without significant tenderness, masses, organomegaly or guarding Extremities: extremities normal, atraumatic, no cyanosis or edema Skin:no rashes, no ecchymoses Neurology: normal without focal findings  Labs and Imaging: Lab Results  Component Value Date/Time   NA 131* 10/06/2013  3:28 PM   NA 144 09/26/2013 12:52 PM   K 3.7 10/06/2013  3:28 PM   K 3.8 09/26/2013 12:52 PM   CL 104 10/06/2013  3:28 PM   CL 105 06/08/2012  9:13 AM   CO2 24 09/26/2013 12:52 PM   CO2 27 07/07/2012  1:53 PM   BUN 12 10/06/2013  3:28 PM   BUN 12.4 09/26/2013 12:52 PM   CREATININE 0.90 10/06/2013  3:28 PM   CREATININE 0.8 09/26/2013 12:52 PM   GLUCOSE 130* 10/06/2013  3:28 PM   GLUCOSE 181* 09/26/2013 12:52 PM   GLUCOSE 120* 06/08/2012  9:13 AM  Lab Results  Component Value Date   WBC 4.6 10/06/2013   HGB 10.2* 10/06/2013   HCT 30.0* 10/06/2013   MCV 97.4 10/06/2013   PLT 147* 10/06/2013    Ct Angio Chest Pe W/cm &/or Wo Cm  10/06/2013   CLINICAL DATA:  Left chest pain.  EXAM: CT ANGIOGRAPHY CHEST WITH CONTRAST  TECHNIQUE: Multidetector CT imaging of the chest was performed using the standard protocol during bolus administration of intravenous contrast. Multiplanar CT image reconstructions and MIPs were obtained to evaluate the vascular anatomy.  CONTRAST:   164m OMNIPAQUE IOHEXOL 350 MG/ML SOLN  COMPARISON:  Chest CT 06/14/2012  FINDINGS: No evidence for a pulmonary embolism. Postoperative changes compatible with a right lower lobectomy. There has been interval enlargement of a right mediastinal lymph node which measures 1.2 cm in the short axis on sequence 401, image 16. Small lymph nodes in the supraclavicular regions. There is also interval enlargement of a prevascular lymph node that measures 1.3 cm on image 27. No significant pericardial or pleural fluid. No acute abnormalities within the upper abdomen.  There are patchy airspace densities in the right upper lobe and findings are suggestive for an infectious or inflammatory process. Patchy parenchymal densities along the periphery of the left lower lobe. There is extensive airspace disease with consolidation in the lingula. No acute bone abnormality.  Review of the MIP images confirms the above findings.  IMPRESSION: Negative for a pulmonary embolism.  Patchy airspace disease in both lungs. There is confluent airspace disease in the lingula and the findings are most compatible with pneumonia. Recommend follow-up to ensure resolution.  Postsurgical changes consistent with a right lower lobectomy and resection of the right lower lobe lesion. There has been interval enlargement of mediastinal lymph nodes. These enlarged lymph nodes are nonspecific and could be reactive in etiology. Recommend attention to these areas on follow up imaging.   Electronically Signed   By: AMarkus DaftM.D.   On: 10/06/2013 17:34           SShanda HowellsMD  Pager: [phone]

## 2013-10-06 NOTE — ED Provider Notes (Signed)
Care assumed from Dr. Lucrezia Starch.  Sent from PCP for PE r/o with left pleurisy. Exam normal.  CT PE study pending.  VSS on arrival but was hypotensive and hypoxic at PCP per reports.  Atypical.  Trop/ BNP for end organ damage for PE risk stratification.    Filed Vitals:   10/06/13 1431  BP: 93/47  Pulse: 69  Temp:   Resp: 18   CT without PE but notable for PNA.  CAP. Not receiving infusions but on maintenance chemo.  Not neutropenic.  Given risk factors including chemo, will admit for abx and observation.  Started rocephin/ azithro.      Tammy Sours, MD 10/07/13 0120

## 2013-10-06 NOTE — ED Provider Notes (Signed)
CSN: [id number]     Arrival date & time 10/06/13  1341 History   First MD Initiated Contact with Patient 10/06/13 1344     Chief Complaint  Patient presents with  . Chest Pain  . Breast Pain     (Consider location/radiation/quality/duration/timing/severity/associated sxs/prior Treatment) HPI [name] 68 y.o. with a pmh of HLP, HTN, multiple myeloma, lung cancer reportedly in remission who presents to the ED at the rec of her PCP for concern of a pulmonary embolism. She reportedly started having left sided pleuritic chest pain that started suddenly last night. It is localized to the left lateral aspect of the chest. Exacerbated with deep inspiration. No known relieving factors. It is moderate in severity. It is non radiaiting. Patient does have a history of right lung lobectomy but no surgeries on the left. She has a baseline cough, but no change in this. No fever, congestion, rhinorrhea. No prolonged airplane/car rides. No calf pain. No estrogen use. Denies any exertional chest pain, SOB, N, or diaphoresis.   Past Medical History  Diagnosis Date  . Osteoporosis   . Hypercholesterolemia   . Esophageal reflux disease   . Hypertension   . Diabetes type 2, controlled   . Pancytopenia   . Ischemic colitis 2009  . Lung nodule 06/11/2012  . Depression   . Sleep apnea     UPPER AIRWAY RESISTANCE  DOES NOT HAVE CPAP , MAY NEED   . Tuberculosis      (+ TB TEST)YEARS AGO POSSIBLE EXPOSURE WAS MED TX   . Multiple myeloma 05/2012    Cytogenetics on 06/08/2012 was normal 20, XX [20]  . Liver enzyme elevation    Past Surgical History  Procedure Laterality Date  . Colonoscopy  2013    poly; Dr. Oletta Lamas, next one due 2018.l  . Appendectomy    . Tonsillectomy    . Leg surgery    . Video bronchoscopy with endobronchial navigation Right 07/08/2012    Procedure: VIDEO BRONCHOSCOPY WITH ENDOBRONCHIAL NAVIGATION;  Surgeon: Collene Gobble, MD;  Location: [facility];  Service: Thoracic;  Laterality:  Right;   Family History  Problem Relation Age of Onset  . Heart attack Father   . Cancer Sister     synovial sarcoma  . Cancer Maternal Grandfather     ? cancer   History  Substance Use Topics  . Smoking status: Former Smoker -- 1.00 packs/day for 30 years    Types: Cigarettes    Quit date: 01/27/2009  . Smokeless tobacco: Never Used  . Alcohol Use: 0.0 oz/week     Comment: 1 bottle per wine per night    OB History   Grav Para Term Preterm Abortions TAB SAB Ect Mult Living                 Review of Systems  All other systems reviewed and are negative.     Allergies  Review of patient's allergies indicates no known allergies.  Home Medications   Prior to Admission medications   Medication Sig Start Date End Date Taking? Authorizing Provider  acyclovir (ZOVIRAX) 400 MG tablet Take 1 tablet (400 mg total) by mouth 2 (two) times daily. 01/18/13   Concha Norway, MD  aspirin 325 MG tablet Take 325 mg by mouth daily.    Historical Provider, MD  buPROPion (WELLBUTRIN XL) 300 MG 24 hr tablet Take 300 mg by mouth daily.    Historical Provider, MD  esomeprazole (NEXIUM) 40 MG capsule Take 40  mg by mouth 2 (two) times daily.     Historical Provider, MD  lenalidomide (REVLIMID) 10 MG capsule Take 1 capsule (10 mg total) by mouth daily. TAKE ONE CAPSULE BY MOUTH FOR 21 DAYS AND 7 DAYS OFF. 09/09/13   Concha Norway, MD  losartan (COZAAR) 50 MG tablet Take 50 mg by mouth daily.    Historical Provider, MD  Magnesium Chloride 535 (64 MG) MG TBCR Take 128 mg by mouth 2 (two) times daily. 02/08/13 02/03/14  Historical Provider, MD  metoprolol succinate (TOPROL-XL) 50 MG 24 hr tablet Take 50 mg by mouth daily. Take with or immediately following a meal.    Historical Provider, MD  Multiple Vitamins-Minerals (MULTIVITAMIN WITH MINERALS) tablet Take 1 tablet by mouth daily.    Historical Provider, MD  Multiple Vitamins-Minerals (PRESERVISION AREDS 2) CAPS Take 1 capsule by mouth 2 (two) times daily.     Historical Provider, MD  sertraline (ZOLOFT) 100 MG tablet Take 100 mg by mouth daily.    Historical Provider, MD  simvastatin (ZOCOR) 40 MG tablet Take 40 mg by mouth every evening.    Historical Provider, MD   BP 93/47  Pulse 69  Temp(Src) 99.2 F (37.3 C) (Oral)  Resp 18  Ht 5' 5.5" (1.664 m)  Wt 196 lb (88.905 kg)  BMI 32.11 kg/m2  SpO2 99% Physical Exam  Constitutional: She is oriented to person, place, and time. She appears well-developed and well-nourished. No distress.  HENT:  Head: Normocephalic and atraumatic.  Right Ear: External ear normal.  Left Ear: External ear normal.  Eyes: Conjunctivae and EOM are normal. Right eye exhibits no discharge. Left eye exhibits no discharge.  Neck: Normal range of motion. Neck supple. No JVD present.  Cardiovascular: Normal rate, regular rhythm and normal heart sounds.  Exam reveals no gallop and no friction rub.   No murmur heard. Pulmonary/Chest: Effort normal and breath sounds normal. No stridor. No respiratory distress. She has no wheezes. She has no rales. She exhibits no tenderness.  Abdominal: Soft. Bowel sounds are normal. She exhibits no distension. There is no tenderness. There is no rebound and no guarding.  Musculoskeletal: Normal range of motion. She exhibits no edema.  Neurological: She is alert and oriented to person, place, and time.  Skin: Skin is warm. No rash noted. She is not diaphoretic.  Psychiatric: She has a normal mood and affect. Her behavior is normal.    ED Course  Procedures (including critical care time) Labs Review Labs Reviewed  CBC WITH DIFFERENTIAL - Abnormal; Notable for the following:    RBC 3.08 (*)    Hemoglobin 9.6 (*)    HCT 30.0 (*)    Platelets 147 (*)    Lymphs Abs 0.5 (*)    Monocytes Relative 13 (*)    All other components within normal limits  I-STAT CHEM 8, ED - Abnormal; Notable for the following:    Sodium 131 (*)    Glucose, Bld 130 (*)    Calcium, Ion 1.05 (*)    Hemoglobin  10.2 (*)    HCT 30.0 (*)    All other components within normal limits  PRO B NATRIURETIC PEPTIDE  I-STAT TROPOININ, ED    Imaging Review No results found.   EKG Interpretation None      MDM   Final diagnoses:  None    Pt presents with pleuritic chest pain. No SOB. No hypoxia. Normotensive here although her PCP reports hypotension. No reproducible pain on exam. AFVSS. VS  wnls. NAD. HDS. No resp distress. ECG not suggestive of ischemia. Will work up for PE, with CTA. Trop and BNP for screening for end organ damage for PE. I have a low suspicion this is related to ACS. If CT PE study is neg, likely related to chest wall pain and can dc with motrin for pain and follow up with her PCP for further management of her symptoms. Patient care assumed by Dr. Henry Russel.   Care discussed with my attending, Dr. Wilson Singer.     Kelby Aline, MD 10/06/13 [phone]

## 2013-10-06 NOTE — Discharge Instructions (Signed)

## 2013-10-06 NOTE — ED Notes (Signed)
Per EMS- Pt comes from PCP c/o left chest pain/breast pain since last night. Denies n/v/d. Pt is a x 4. VSS. BP 112/64, 93% RA. Sent for r/o PE, hx lung cancer and multiple myeloma.

## 2013-10-06 NOTE — ED Notes (Signed)
[phone] Sara-Sister

## 2013-10-06 NOTE — ED Notes (Signed)
Resident at bedside.  

## 2013-10-07 ENCOUNTER — Other Ambulatory Visit: Payer: Self-pay | Admitting: *Deleted

## 2013-10-07 ENCOUNTER — Inpatient Hospital Stay (HOSPITAL_COMMUNITY): Payer: Medicare Other

## 2013-10-07 DIAGNOSIS — J189 Pneumonia, unspecified organism: Secondary | ICD-10-CM

## 2013-10-07 DIAGNOSIS — D696 Thrombocytopenia, unspecified: Secondary | ICD-10-CM | POA: Diagnosis present

## 2013-10-07 DIAGNOSIS — D72819 Decreased white blood cell count, unspecified: Secondary | ICD-10-CM | POA: Diagnosis present

## 2013-10-07 DIAGNOSIS — R091 Pleurisy: Secondary | ICD-10-CM

## 2013-10-07 DIAGNOSIS — I369 Nonrheumatic tricuspid valve disorder, unspecified: Secondary | ICD-10-CM

## 2013-10-07 DIAGNOSIS — J9601 Acute respiratory failure with hypoxia: Secondary | ICD-10-CM | POA: Diagnosis present

## 2013-10-07 DIAGNOSIS — E860 Dehydration: Secondary | ICD-10-CM | POA: Diagnosis present

## 2013-10-07 DIAGNOSIS — A419 Sepsis, unspecified organism: Principal | ICD-10-CM

## 2013-10-07 DIAGNOSIS — R072 Precordial pain: Secondary | ICD-10-CM

## 2013-10-07 LAB — CBC WITH DIFFERENTIAL/PLATELET
BASOS ABS: 0 10*3/uL (ref 0.0–0.1)
BASOS ABS: 0 10*3/uL (ref 0.0–0.1)
Basophils Relative: 0 % (ref 0–1)
Basophils Relative: 0 % (ref 0–1)
EOS ABS: 0.1 10*3/uL (ref 0.0–0.7)
EOS PCT: 2 % (ref 0–5)
EOS PCT: 4 % (ref 0–5)
Eosinophils Absolute: 0.1 10*3/uL (ref 0.0–0.7)
HEMATOCRIT: 27.1 % — AB (ref 36.0–46.0)
HEMATOCRIT: 26.1 % — AB (ref 36.0–46.0)
Hemoglobin: 8.9 g/dL — ABNORMAL LOW (ref 12.0–15.0)
Hemoglobin: 8.5 g/dL — ABNORMAL LOW (ref 12.0–15.0)
LYMPHS ABS: 0.3 10*3/uL — AB (ref 0.7–4.0)
LYMPHS PCT: 8 % — AB (ref 12–46)
LYMPHS PCT: 14 % (ref 12–46)
Lymphs Abs: 0.3 10*3/uL — ABNORMAL LOW (ref 0.7–4.0)
MCH: 31.4 pg (ref 26.0–34.0)
MCH: 31.1 pg (ref 26.0–34.0)
MCHC: 32.8 g/dL (ref 30.0–36.0)
MCHC: 32.6 g/dL (ref 30.0–36.0)
MCV: 95.8 fL (ref 78.0–100.0)
MCV: 95.6 fL (ref 78.0–100.0)
MONOS PCT: 12 % (ref 3–12)
Monocytes Absolute: 0.4 10*3/uL (ref 0.1–1.0)
Monocytes Absolute: 0.2 10*3/uL (ref 0.1–1.0)
Monocytes Relative: 8 % (ref 3–12)
Neutro Abs: 2.8 10*3/uL (ref 1.7–7.7)
Neutro Abs: 1.3 10*3/uL — ABNORMAL LOW (ref 1.7–7.7)
Neutrophils Relative %: 78 % — ABNORMAL HIGH (ref 43–77)
Neutrophils Relative %: 74 % (ref 43–77)
Platelets: 128 10*3/uL — ABNORMAL LOW (ref 150–400)
Platelets: 122 10*3/uL — ABNORMAL LOW (ref 150–400)
RBC: 2.83 MIL/uL — ABNORMAL LOW (ref 3.87–5.11)
RBC: 2.73 MIL/uL — ABNORMAL LOW (ref 3.87–5.11)
RDW: 14.3 % (ref 11.5–15.5)
RDW: 14.3 % (ref 11.5–15.5)
WBC: 3.6 10*3/uL — ABNORMAL LOW (ref 4.0–10.5)
WBC: 1.8 10*3/uL — AB (ref 4.0–10.5)

## 2013-10-07 LAB — COMPREHENSIVE METABOLIC PANEL
ALT: 37 U/L — AB (ref 0–35)
AST: 23 U/L (ref 0–37)
Albumin: 2.5 g/dL — ABNORMAL LOW (ref 3.5–5.2)
Alkaline Phosphatase: 131 U/L — ABNORMAL HIGH (ref 39–117)
Anion gap: 15 (ref 5–15)
BUN: 17 mg/dL (ref 6–23)
CO2: 21 meq/L (ref 19–32)
Calcium: 8.4 mg/dL (ref 8.4–10.5)
Chloride: 102 mEq/L (ref 96–112)
Creatinine, Ser: 0.98 mg/dL (ref 0.50–1.10)
GFR calc Af Amer: 68 mL/min — ABNORMAL LOW (ref >90)
GFR, EST NON AFRICAN AMERICAN: 59 mL/min — AB (ref >90)
Glucose, Bld: 104 mg/dL — ABNORMAL HIGH (ref 70–99)
POTASSIUM: 4.3 meq/L (ref 3.7–5.3)
Sodium: 138 mEq/L (ref 137–147)
Total Bilirubin: 0.5 mg/dL (ref 0.3–1.2)
Total Protein: 6.3 g/dL (ref 6.0–8.3)

## 2013-10-07 LAB — GLUCOSE, CAPILLARY
GLUCOSE-CAPILLARY: 87 mg/dL (ref 70–99)
GLUCOSE-CAPILLARY: 106 mg/dL — AB (ref 70–99)
Glucose-Capillary: 106 mg/dL — ABNORMAL HIGH (ref 70–99)
Glucose-Capillary: 102 mg/dL — ABNORMAL HIGH (ref 70–99)

## 2013-10-07 LAB — TROPONIN I
Troponin I: <0.3 ng/mL (ref <0.30)
Troponin I: <0.3 ng/mL (ref <0.30)

## 2013-10-07 LAB — BASIC METABOLIC PANEL
Anion gap: 15 (ref 5–15)
BUN: 21 mg/dL (ref 6–23)
CALCIUM: 8.4 mg/dL (ref 8.4–10.5)
CO2: 20 meq/L (ref 19–32)
CREATININE: 1 mg/dL (ref 0.50–1.10)
Chloride: 100 mEq/L (ref 96–112)
GFR calc Af Amer: 67 mL/min — ABNORMAL LOW (ref >90)
GFR, EST NON AFRICAN AMERICAN: 57 mL/min — AB (ref >90)
GLUCOSE: 95 mg/dL (ref 70–99)
Potassium: 4.3 mEq/L (ref 3.7–5.3)
SODIUM: 135 meq/L — AB (ref 137–147)

## 2013-10-07 LAB — LIPID PANEL
Cholesterol: 125 mg/dL (ref 0–200)
HDL: 70 mg/dL (ref >39)
LDL CALC: 42 mg/dL (ref 0–99)
Total CHOL/HDL Ratio: 1.8 RATIO
Triglycerides: 65 mg/dL (ref <150)
VLDL: 13 mg/dL (ref 0–40)

## 2013-10-07 LAB — HEMOGLOBIN A1C
HEMOGLOBIN A1C: 6 % — AB (ref <5.7)
Mean Plasma Glucose: 126 mg/dL — ABNORMAL HIGH (ref <117)

## 2013-10-07 LAB — URINALYSIS, ROUTINE W REFLEX MICROSCOPIC
BILIRUBIN URINE: NEGATIVE
Glucose, UA: NEGATIVE mg/dL
Hgb urine dipstick: NEGATIVE
KETONES UR: NEGATIVE mg/dL
Leukocytes, UA: NEGATIVE
NITRITE: NEGATIVE
Protein, ur: NEGATIVE mg/dL
Specific Gravity, Urine: 1.015 (ref 1.005–1.030)
UROBILINOGEN UA: 0.2 mg/dL (ref 0.0–1.0)
pH: 5.5 (ref 5.0–8.0)

## 2013-10-07 LAB — LACTIC ACID, PLASMA: Lactic Acid, Venous: 0.9 mmol/L (ref 0.5–2.2)

## 2013-10-07 LAB — STREP PNEUMONIAE URINARY ANTIGEN: Strep Pneumo Urinary Antigen: NEGATIVE

## 2013-10-07 LAB — HIV ANTIBODY (ROUTINE TESTING W REFLEX): HIV: NONREACTIVE

## 2013-10-07 LAB — PROCALCITONIN: PROCALCITONIN: 0.82 ng/mL

## 2013-10-07 MED ORDER — VANCOMYCIN HCL IN DEXTROSE 750-5 MG/150ML-% IV SOLN
750.0000 mg | Freq: Two times a day (BID) | INTRAVENOUS | Status: DC
  Administered 2013-10-07 – 2013-10-08 (×3): 750 mg via INTRAVENOUS
  Filled 2013-10-07 (×4): qty 150

## 2013-10-07 MED ORDER — LORAZEPAM 1 MG PO TABS
1.0000 mg | ORAL_TABLET | Freq: Four times a day (QID) | ORAL | Status: DC | PRN
  Administered 2013-10-07: 1 mg via ORAL
  Filled 2013-10-07: qty 2

## 2013-10-07 MED ORDER — ALBUTEROL SULFATE (2.5 MG/3ML) 0.083% IN NEBU: 5.0000 mg | INHALATION_SOLUTION | Freq: Once | RESPIRATORY_TRACT | Status: DC

## 2013-10-07 MED ORDER — SODIUM CHLORIDE 0.9 % IV BOLUS (SEPSIS)
500.0000 mL | Freq: Once | INTRAVENOUS | Status: AC
  Administered 2013-10-07: 500 mL via INTRAVENOUS

## 2013-10-07 MED ORDER — DEXTROSE 5 % IV SOLN
1.0000 g | Freq: Three times a day (TID) | INTRAVENOUS | Status: DC
  Administered 2013-10-07 – 2013-10-12 (×15): 1 g via INTRAVENOUS
  Filled 2013-10-07 (×19): qty 1

## 2013-10-07 MED ORDER — KETOROLAC TROMETHAMINE 30 MG/ML IJ SOLN
30.0000 mg | Freq: Four times a day (QID) | INTRAMUSCULAR | Status: AC | PRN
  Filled 2013-10-07: qty 1

## 2013-10-07 MED ORDER — MORPHINE SULFATE 2 MG/ML IJ SOLN: 1.0000 mg | INTRAMUSCULAR | Status: DC | PRN

## 2013-10-07 MED ORDER — KETOROLAC TROMETHAMINE 30 MG/ML IJ SOLN: 30.0000 mg | Freq: Once | INTRAMUSCULAR | Status: DC

## 2013-10-07 MED ORDER — ACETYLCYSTEINE 10 % IN SOLN
4.0000 mL | Freq: Once | RESPIRATORY_TRACT | Status: DC
  Filled 2013-10-07: qty 4

## 2013-10-07 MED ORDER — PERFLUTREN LIPID MICROSPHERE
1.0000 mL | INTRAVENOUS | Status: AC | PRN
  Filled 2013-10-07: qty 10

## 2013-10-07 MED ORDER — NITROGLYCERIN 0.4 MG SL SUBL: 0.4000 mg | SUBLINGUAL_TABLET | SUBLINGUAL | Status: DC | PRN

## 2013-10-07 MED ORDER — IPRATROPIUM BROMIDE 0.02 % IN SOLN: 0.5000 mg | Freq: Once | RESPIRATORY_TRACT | Status: DC

## 2013-10-07 MED ORDER — ACETAMINOPHEN 325 MG PO TABS
650.0000 mg | ORAL_TABLET | ORAL | Status: DC | PRN
  Administered 2013-10-07 – 2013-10-10 (×2): 650 mg via ORAL
  Filled 2013-10-07 (×2): qty 2

## 2013-10-07 MED ORDER — INSULIN ASPART 100 UNIT/ML ~~LOC~~ SOLN: 0.0000 [IU] | Freq: Three times a day (TID) | SUBCUTANEOUS | Status: DC

## 2013-10-07 MED ORDER — IPRATROPIUM-ALBUTEROL 0.5-2.5 (3) MG/3ML IN SOLN
3.0000 mL | RESPIRATORY_TRACT | Status: DC | PRN
  Administered 2013-10-07: 3 mL via RESPIRATORY_TRACT
  Filled 2013-10-07: qty 3

## 2013-10-07 MED ORDER — KETOROLAC TROMETHAMINE 30 MG/ML IJ SOLN
30.0000 mg | Freq: Once | INTRAMUSCULAR | Status: AC
  Administered 2013-10-07: 30 mg via INTRAVENOUS
  Filled 2013-10-07: qty 1

## 2013-10-07 MED ORDER — GUAIFENESIN-DM 100-10 MG/5ML PO SYRP
5.0000 mL | ORAL_SOLUTION | ORAL | Status: DC | PRN
  Administered 2013-10-07: 5 mL via ORAL
  Filled 2013-10-07 (×2): qty 5

## 2013-10-07 MED ORDER — PHENOL 1.4 % MT LIQD
1.0000 | OROMUCOSAL | Status: DC | PRN
  Filled 2013-10-07 (×2): qty 177

## 2013-10-07 MED ORDER — OXYCODONE HCL 5 MG PO TABS
5.0000 mg | ORAL_TABLET | ORAL | Status: DC | PRN
  Administered 2013-10-07: 5 mg via ORAL
  Administered 2013-10-11: 10 mg via ORAL
  Filled 2013-10-07: qty 2
  Filled 2013-10-07: qty 1

## 2013-10-07 NOTE — Progress Notes (Addendum)
[facility]  ERYKAH LIPPERT CXK:[id number] DOB: 1945/07/03 DOA: 10/06/2013 PCP: [name], MD   Brief narrative: 68 year old female patient currently undergoing chemotherapy treatment for multiple myeloma. 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 She also has chronic problems related to hypertension diabetes. She presented to the hospital because of chest pain left-sided that was fairly constant in nature. It seemed to be worse with coughing and deep breathing. She denied any issues related to fever or shortness of breath. She endorses mild sore throat upper respiratory symptoms one week prior that had resolved. Initially presented to her primary care physician's office with these complaints was noted to be tachycardic and hypoxic and was directed to the ER for further evaluation. Because of her underlying complaints he there was concern for possible pulmonary embolism.  In the ER she had a low-grade temperature with blood pressure readings in the 97-026 systolic range. Her O2 saturations were in the mid 90s on room. Her white count was 4600. Platelets 140,000 which are lower than normal for her. EKG was sinus rhythm. CTA was negative for PE but did show bilateral patchy airspace disease concerning for infiltrate/pneumonia there was also mildly enlarged lymph nodes that appear to be reactive in nature.  HPI/Subjective: Patient alert and primarily complaining of very significant pleuritic chest discomfort. Also endorses episode of what she describes as laryngeal spasm  Assessment/Plan: Active Problems:   Sepsis Secondary to pneumonia. Blood pressures better, but borderline. Bolus saline and continue SDU. Continue abx for pneumonia      Acute respiratory failure with hypoxia/pneumonia in imunocompromiswed/Pleurisy patient immunocompromised and chemotherapy-continue oxygen and other supportive care.  Broaden abx. toradol for pleurisy which is patients main complaint currently    Status post lobectomy of lung secondary to carcinoid/Multiple myeloma -Acutely ill so we'll discontinue Revlimid-patient's oncologist has been added as consultant in the office notified-continue acyclovir      Dehydration/hypotension -Continue IV fluids-systolic blood pressure dropped to 79 earlier today so was given a fluid bolus of 500 cc    Thrombocytopenia/Leukopenia -Baseline WBC has ranged from 2.7-3.1 while on chemotherapy with platelet count ranging from 134,000- 162,000-most recent WBC has dropped to 1.8 and platelet count has dropped to 122,000 which is concerning for an evolving sepsis process-followup labs in a.m. and add coags in the event has developed DIC    HYPERTENSION with mild concentric hypertrophy -Blood pressure soft-home Toprol-XL and Cozaar on hold   Pulmonary hypertension -Noted on echo this admission with pressure of 51 mmHg-may benefit from nitrate and/or the addition of Norvasc once recovers   Abnormal Hemeglobin A1c -6.0-follow CBGs closely and provide sliding scale insulin if needed  DVT prophylaxis: Subcutaneous heparin Code Status: Full Family Communication: No family at bedside Disposition Plan/Expected LOS: Stepdown  Consultants: None  Procedures: 2-D echocardiogram: - Left ventricle: The cavity size was normal. There was mild concentric hypertrophy. Systolic function was normal. The estimated ejection fraction was in the range of 55% to 60%. Wall motion was normal; there were no regional wall motion abnormalities. - Mitral valve: There was trivial regurgitation. - Left atrium: The atrium was moderately dilated. - Tricuspid valve: There was mild regurgitation. - Pulmonic valve: There was trivial regurgitation. - Pulmonary arteries: PA peak pressure: 51 mm Hg (S). - The right ventricular systolic pressure was increased consistent with moderate pulmonary  hypertension.  Cultures: 9/10 blood cultures x2 pending 9/11 urine culture  Antibiotics: Acyclovir 9/10 >>> Cefepime 9/11 >>> Levaquin 9/11 >>> Vancomycin 9/11 >>> Rocephin 9/10 x 1 dose Zithromax 9/10 x 1 dose  Objective: Blood pressure 97/86, pulse 69, temperature 97.5 F (36.4 C), temperature source Oral, resp. rate 23, height '5\' 5"'  (1.651 m), weight 199 lb 8.3 oz (90.5 kg), SpO2 100.00%.  Intake/Output Summary (Last 24 hours) at 10/07/13 1432 Last data filed at 10/07/13 1421  Gross per 24 hour  Intake   1300 ml  Output    600 ml  Net    700 ml     Exam: Gen: uncomfortable. oriented Chest: CTA without WRR. Splinting. Cardiac: Regular rate and rhythm, S1-S2,  no rubs murmurs or gallops, no peripheral edema, no JVD Abdomen: Soft nontender nondistended without obvious hepatosplenomegaly, no ascites Extremities: Symmetrical in appearance without cyanosis, clubbing or edema  Scheduled Meds:  Scheduled Meds: . acetylcysteine  4 mL Nebulization Once  . acyclovir  400 mg Oral BID  . albuterol  5 mg Nebulization Once  . aspirin  325 mg Oral Daily  . buPROPion  300 mg Oral Daily  . ceFEPime (MAXIPIME) IV  1 g Intravenous Q8H  . heparin  5,000 Units Subcutaneous 3 times per day  . insulin aspart  0-9 Units Subcutaneous 6 times per day  . ipratropium  0.5 mg Nebulization Once  . multivitamin with minerals  1 tablet Oral Daily  . pantoprazole  80 mg Oral Q1200  . sertraline  100 mg Oral Daily  . simvastatin  40 mg Oral QPM  . vancomycin  750 mg Intravenous Q12H   Continuous Infusions: . sodium chloride 150 mL/hr at 10/07/13 1432    Data Reviewed: Basic Metabolic Panel:  Recent Labs Lab 10/06/13 1528 10/06/13 2041 10/07/13 0100 10/07/13 0930  NA 131*  --  138 135*  K 3.7  --  4.3 4.3  CL 104  --  102 100  CO2  --   --  21 20  GLUCOSE 130*  --  104* 95  BUN 12  --  17 21  CREATININE 0.90 0.77 0.98 1.00  CALCIUM  --   --  8.4 8.4   Liver Function  Tests:  Recent Labs Lab 10/07/13 0100  AST 23  ALT 37*  ALKPHOS 131*  BILITOT 0.5  PROT 6.3  ALBUMIN 2.5*   No results found for this basename: LIPASE, AMYLASE,  in the last 168 hours No results found for this basename: AMMONIA,  in the last 168 hours CBC:  Recent Labs Lab 10/06/13 1409 10/06/13 1528 10/06/13 2041 10/07/13 0100 10/07/13 0930  WBC 4.6  --  3.6* 3.6* 1.8*  NEUTROABS 3.4  --   --  2.8 1.3*  HGB 9.6* 10.2* 9.1* 8.9* 8.5*  HCT 30.0* 30.0* 27.4* 27.1* 26.1*  MCV 97.4  --  94.5 95.8 95.6  PLT 147*  --  137* 128* 122*   Cardiac Enzymes:  Recent Labs Lab 10/06/13 2040 10/07/13 0100 10/07/13 0700  TROPONINI <0.30 <0.30 <0.30   BNP (last 3 results)  Recent Labs  10/06/13 1409  PROBNP 597.9*   CBG:  Recent Labs Lab 10/06/13 2312 10/07/13 0510 10/07/13 0739 10/07/13 1156  GLUCAP 93 87 106* 106*    Recent Results (from the past 240 hour(s))  MRSA PCR SCREENING     Status: None   Collection Time    10/06/13 10:20 PM      Result Value Ref Range Status   MRSA by PCR NEGATIVE  NEGATIVE Final  Comment:            The GeneXpert MRSA Assay (FDA     approved for NASAL specimens     only), is one component of a     comprehensive MRSA colonization     surveillance program. It is not     intended to diagnose MRSA     infection nor to guide or     monitor treatment for     MRSA infections.     Studies:  Recent x-ray studies have been reviewed in detail by the Attending Physician  Time spent :      Erin Hearing, [place] [facility] Office  [phone] Pager [phone]  If 7PM-7AM, please contact night-coverage [email or web address] Password [email or web address] 10/07/2013, 2:32 PM   LOS: 1 day   Attending note: chart reviewed. Patient examined independently. Above note amended. Agree  Doree Barthel, MD [facility]

## 2013-10-07 NOTE — Progress Notes (Signed)
Patient report air gushing to her right ear, Erin Hearing NP notified. Got call back and will just monitor patient for now and health teaching given to report if symptoms worsen.Will continue to monitor.

## 2013-10-07 NOTE — Progress Notes (Signed)
Called to patient's room by tech to assist with patient's escalating mood. Pt upset that nobody had told her of medication exchanges and that she did not know what the plan of care was. Dr. Conley Canal paged and she updated and calmed patient's brother. Pharmacy came to complete med reconciliation with brother present.

## 2013-10-07 NOTE — Progress Notes (Signed)
ANTIBIOTIC CONSULT NOTE - INITIAL  Pharmacy Consult for vancomycin/cefepime Indication: pneumonia  No Known Allergies  Patient Measurements: Height: '5\' 5"'  (165.1 cm) Weight: 199 lb 8.3 oz (90.5 kg) IBW/kg (Calculated) : 57  Vital Signs: Temp: 98.2 F (36.8 C) (09/11 0740) Temp src: Oral (09/11 0740) BP: 98/48 mmHg (09/11 0740) Pulse Rate: 76 (09/11 0740) Intake/Output from previous day:   Intake/Output from this shift:    Labs:  Recent Labs  10/06/13 1409 10/06/13 1528 10/06/13 2041 10/07/13 0100  WBC 4.6  --  3.6* 3.6*  HGB 9.6* 10.2* 9.1* 8.9*  PLT 147*  --  137* 128*  CREATININE  --  0.90 0.77 0.98   Estimated Creatinine Clearance: 62.8 ml/min (by C-G formula based on Cr of 0.98). No results found for this basename: VANCOTROUGH, Corlis Leak, VANCORANDOM, GENTTROUGH, GENTPEAK, GENTRANDOM, TOBRATROUGH, TOBRAPEAK, TOBRARND, AMIKACINPEAK, AMIKACINTROU, AMIKACIN,  in the last 72 hours   Microbiology: Recent Results (from the past 720 hour(s))  MRSA PCR SCREENING     Status: None   Collection Time    10/06/13 10:20 PM      Result Value Ref Range Status   MRSA by PCR NEGATIVE  NEGATIVE Final   Comment:            The GeneXpert MRSA Assay (FDA     approved for NASAL specimens     only), is one component of a     comprehensive MRSA colonization     surveillance program. It is not     intended to diagnose MRSA     infection nor to guide or     monitor treatment for     MRSA infections.    Medical History: Past Medical History  Diagnosis Date  . Osteoporosis   . Hypercholesterolemia   . Esophageal reflux disease   . Hypertension   . Diabetes type 2, controlled   . Pancytopenia   . Ischemic colitis 2009  . Lung nodule 06/11/2012  . Depression   . Sleep apnea     UPPER AIRWAY RESISTANCE  DOES NOT HAVE CPAP , MAY NEED   . Tuberculosis      (+ TB TEST)YEARS AGO POSSIBLE EXPOSURE WAS MED TX   . Multiple myeloma 05/2012    Cytogenetics on 06/08/2012 was normal  36, XX [20]  . Liver enzyme elevation     Assessment: 25 YOF with hx of Multiple myeloma s/p chemotherapy who presented with L sided chest pain and worsening cough. CTA negative for PE, but did show b/l patchy airspace disease concerning for infiltrate. Pharmacy is consulted to start vancomycin and cefepime for pneumonia. She received a dose of levaquin IV last night. Afebrile, wbc low 3.6. Scr 0.98, est. crcl ~ 60 ml/min. Blood and sputum cultures are pending  Goal of Therapy:  Vancomycin trough level 15-20 mcg/ml  Plan:  - Vancomycin 750 mg IV Q 12 hrs - Cefepime 1g IV Q 8 hrs - Monitor renal function - f/u cultures - Vancomycin trough at steady state.  Maryanna Shape, PharmD, BCPS  Clinical Pharmacist  Pager: [phone]   10/07/2013,8:22 AM

## 2013-10-07 NOTE — Telephone Encounter (Signed)
THIS REFILL REQUEST FOR REVLIMID WAS GIVEN TO DR.SHEBAI'S NURSE, STACEY CAMP,RN.

## 2013-10-07 NOTE — Progress Notes (Signed)
Temp of 101.0 oral. MD aware paged. No new orders given. Tylenol 650mg  given. Will recheck pt's temp.

## 2013-10-07 NOTE — Progress Notes (Signed)
Echocardiogram 2D Echocardiogram with Definity has been performed.  [name] 10/07/2013, 10:19 AM

## 2013-10-08 ENCOUNTER — Inpatient Hospital Stay (HOSPITAL_COMMUNITY): Payer: Medicare Other

## 2013-10-08 DIAGNOSIS — J9600 Acute respiratory failure, unspecified whether with hypoxia or hypercapnia: Secondary | ICD-10-CM

## 2013-10-08 DIAGNOSIS — D696 Thrombocytopenia, unspecified: Secondary | ICD-10-CM

## 2013-10-08 DIAGNOSIS — D72819 Decreased white blood cell count, unspecified: Secondary | ICD-10-CM

## 2013-10-08 DIAGNOSIS — C9000 Multiple myeloma not having achieved remission: Secondary | ICD-10-CM

## 2013-10-08 LAB — CBC WITH DIFFERENTIAL/PLATELET
BASOS ABS: 0 10*3/uL (ref 0.0–0.1)
Basophils Relative: 0 % (ref 0–1)
EOS ABS: 0.1 10*3/uL (ref 0.0–0.7)
Eosinophils Relative: 6 % — ABNORMAL HIGH (ref 0–5)
HCT: 25.5 % — ABNORMAL LOW (ref 36.0–46.0)
Hemoglobin: 8.3 g/dL — ABNORMAL LOW (ref 12.0–15.0)
Lymphocytes Relative: 15 % (ref 12–46)
Lymphs Abs: 0.2 10*3/uL — ABNORMAL LOW (ref 0.7–4.0)
MCH: 30.6 pg (ref 26.0–34.0)
MCHC: 32.5 g/dL (ref 30.0–36.0)
MCV: 94.1 fL (ref 78.0–100.0)
Monocytes Absolute: 0.1 10*3/uL (ref 0.1–1.0)
Monocytes Relative: 7 % (ref 3–12)
NEUTROS ABS: 1.1 10*3/uL — AB (ref 1.7–7.7)
Neutrophils Relative %: 72 % (ref 43–77)
PLATELETS: 126 10*3/uL — AB (ref 150–400)
RBC: 2.71 MIL/uL — AB (ref 3.87–5.11)
RDW: 14.1 % (ref 11.5–15.5)
WBC: 1.6 10*3/uL — ABNORMAL LOW (ref 4.0–10.5)

## 2013-10-08 LAB — COMPREHENSIVE METABOLIC PANEL
ALK PHOS: 178 U/L — AB (ref 39–117)
ALT: 46 U/L — AB (ref 0–35)
AST: 36 U/L (ref 0–37)
Albumin: 2.2 g/dL — ABNORMAL LOW (ref 3.5–5.2)
Anion gap: 12 (ref 5–15)
BUN: 15 mg/dL (ref 6–23)
CALCIUM: 8.3 mg/dL — AB (ref 8.4–10.5)
CO2: 19 meq/L (ref 19–32)
Chloride: 103 mEq/L (ref 96–112)
Creatinine, Ser: 0.64 mg/dL (ref 0.50–1.10)
GFR calc Af Amer: >90 mL/min (ref >90)
GLUCOSE: 135 mg/dL — AB (ref 70–99)
Potassium: 3.8 mEq/L (ref 3.7–5.3)
SODIUM: 134 meq/L — AB (ref 137–147)
Total Bilirubin: 0.4 mg/dL (ref 0.3–1.2)
Total Protein: 5.9 g/dL — ABNORMAL LOW (ref 6.0–8.3)

## 2013-10-08 LAB — LEGIONELLA ANTIGEN, URINE: Legionella Antigen, Urine: NEGATIVE

## 2013-10-08 LAB — PRO B NATRIURETIC PEPTIDE: Pro B Natriuretic peptide (BNP): 1718 pg/mL — ABNORMAL HIGH (ref 0–125)

## 2013-10-08 LAB — PROTIME-INR
INR: 1.17 (ref 0.00–1.49)
PROTHROMBIN TIME: 14.9 s (ref 11.6–15.2)

## 2013-10-08 MED ORDER — VANCOMYCIN HCL 10 G IV SOLR
1250.0000 mg | Freq: Two times a day (BID) | INTRAVENOUS | Status: DC
  Administered 2013-10-08 – 2013-10-12 (×8): 1250 mg via INTRAVENOUS
  Filled 2013-10-08 (×10): qty 1250

## 2013-10-08 MED ORDER — PANTOPRAZOLE SODIUM 40 MG PO TBEC
40.0000 mg | DELAYED_RELEASE_TABLET | Freq: Two times a day (BID) | ORAL | Status: DC
  Administered 2013-10-08 – 2013-10-13 (×11): 40 mg via ORAL
  Filled 2013-10-08 (×11): qty 1

## 2013-10-08 MED ORDER — CEPASTAT 14.5 MG MT LOZG
1.0000 | LOZENGE | OROMUCOSAL | Status: DC | PRN
  Administered 2013-10-08: 1 via BUCCAL
  Filled 2013-10-08: qty 9

## 2013-10-08 MED ORDER — FUROSEMIDE 10 MG/ML IJ SOLN
40.0000 mg | Freq: Once | INTRAMUSCULAR | Status: AC
  Administered 2013-10-08: 40 mg via INTRAVENOUS
  Filled 2013-10-08: qty 4

## 2013-10-08 NOTE — Progress Notes (Signed)
CXR shows pulmonary edema and pBNP >1K. Give lasix. Discussed findings with patient at bedside.  Jessica Barthel, MD

## 2013-10-08 NOTE — Progress Notes (Signed)
ANTIBIOTIC CONSULT NOTE  Pharmacy Consult for vancomycin/cefepime Indication: pneumonia  No Known Allergies  Patient Measurements: Height: 5' 5" (165.1 cm) Weight: 199 lb 8.3 oz (90.5 kg) IBW/kg (Calculated) : 57  Vital Signs: Temp: 99.7 F (37.6 C) (09/12 1200) Temp src: Oral (09/12 1200) BP: 118/50 mmHg (09/12 0600) Pulse Rate: 78 (09/12 0600) Intake/Output from previous day: 09/11 0701 - 09/12 0700 In: 2013.3 [P.O.:840; I.V.:973.3; IV Piggyback:200] Out: 600 [Urine:600] Intake/Output from this shift:    Labs:  Recent Labs  10/07/13 0100 10/07/13 0930 10/08/13 0927  WBC 3.6* 1.8* 1.6*  HGB 8.9* 8.5* 8.3*  PLT 128* 122* 126*  CREATININE 0.98 1.00 0.64   Estimated Creatinine Clearance: 76.9 ml/min (by C-G formula based on Cr of 0.64). No results found for this basename: VANCOTROUGH, Corlis Leak, VANCORANDOM, GENTTROUGH, GENTPEAK, GENTRANDOM, TOBRATROUGH, TOBRAPEAK, TOBRARND, AMIKACINPEAK, AMIKACINTROU, AMIKACIN,  in the last 72 hours   Microbiology: Recent Results (from the past 720 hour(s))  MRSA PCR SCREENING     Status: None   Collection Time    10/06/13 10:20 PM      Result Value Ref Range Status   MRSA by PCR NEGATIVE  NEGATIVE Final   Comment:            The GeneXpert MRSA Assay (FDA     approved for NASAL specimens     only), is one component of a     comprehensive MRSA colonization     surveillance program. It is not     intended to diagnose MRSA     infection nor to guide or     monitor treatment for     MRSA infections.    Assessment: 82 YOF with hx of Multiple myeloma s/p chemotherapy who presented with L sided chest pain and worsening cough. CTA negative for PE, but did show b/l patchy airspace disease concerning for infiltrate and she was started on vancomycin and cefepime. She did receive 1 dose of levofloxacin on 9/10.  Blood, urine and sputum cultures pending.  Afebrile, wbc decreased to 1.6. Scr improved to 0.64 and CrCl is  ~55m/min.  Goal of Therapy:  Vancomycin trough level 15-20 mcg/ml  Plan:  - change Vancomycin to 12562mIV q12h with improvement in renal function - Cefepime 1g IV Q 8 hrs - f/u c/s, clinical progression, renal function, Vancomycin trough at steady state   Khamani Daniely D. Julea Hutto, PharmD, BCPS Clinical Pharmacist Pager: [phone]/12/2013 1:19 PM

## 2013-10-08 NOTE — Progress Notes (Signed)
[facility]  [name] OEV:[id number] DOB: [birth year] DOA: 10/06/2013 PCP: [name], MD   Brief narrative: 68 year old female patient currently undergoing chemotherapy treatment for multiple myeloma. 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 She also has chronic problems related to hypertension diabetes. She presented to the hospital because of chest pain left-sided that was fairly constant in nature. It seemed to be worse with coughing and deep breathing. She denied any issues related to fever or shortness of breath. She endorses mild sore throat upper respiratory symptoms one week prior that had resolved. Initially presented to her primary care physician's office with these complaints was noted to be tachycardic and hypoxic and was directed to the ER for further evaluation. Because of her underlying complaints he there was concern for possible pulmonary embolism.  In the ER she had a low-grade temperature with blood pressure readings in the 82-993 systolic range. Her O2 saturations were in the mid 90s on room. Her white count was 4600. Platelets 140,000 which are lower than normal for her. EKG was sinus rhythm. CTA was negative for PE but did show bilateral patchy airspace disease concerning for infiltrate/pneumonia there was also mildly enlarged lymph nodes that appear to be reactive in nature.  Assessment/Plan: Active Problems:   Sepsis Vitals improving. Decrease IV to [facility].      Acute respiratory failure with hypoxia/pneumonia in imunocompromiswed/Pleurisy patient immunocompromised and chemotherapy-continue oxygen and other supportive care. Broaden abx. Continue current. Pt reports some orthopnea. Will repeat CXR and check pro BNP r/o fluid overload. Echo shows good EF.    Status post lobectomy of lung secondary to carcinoid/Multiple myeloma -Acutely ill so we'll discontinue  Revlimid-patient's oncologist has been added as consultant in the office notified-continue acyclovir      Dehydration/hypotension resolved    Thrombocytopenia/Leukopenia Labs pending.    HYPERTENSION with mild concentric hypertrophy meds held   Pulmonary hypertension -Noted on echo this admission with pressure of 51 mmHg   Abnormal Hemeglobin A1c -6.0  GERD: takes bid ppi. Will change.  Extensive discussion about test results, medications, progress, plans today. Patient reportedly very upset last night about changes to medications, plans etc. Pt expressed thanks and appears calmer.   DVT prophylaxis: Subcutaneous heparin Code Status: Full Family Communication: brother 9/11 by phone Disposition Plan/Expected LOS: Stepdown  Consultants: None  Procedures: 2-D echocardiogram: - Left ventricle: The cavity size was normal. There was mild concentric hypertrophy. Systolic function was normal. The estimated ejection fraction was in the range of 55% to 60%. Wall motion was normal; there were no regional wall motion abnormalities. - Mitral valve: There was trivial regurgitation. - Left atrium: The atrium was moderately dilated. - Tricuspid valve: There was mild regurgitation. - Pulmonic valve: There was trivial regurgitation. - Pulmonary arteries: PA peak pressure: 51 mm Hg (S). - The right ventricular systolic pressure was increased consistent with moderate pulmonary hypertension.  Cultures: 9/10 blood cultures x2 pending 9/11 urine culture  Antibiotics: Acyclovir 9/10 >>> Cefepime 9/11 >>> Levaquin 9/11 >>> Vancomycin 9/11 >>> Rocephin 9/10 x 1 dose Zithromax 9/10 x 1 dose  HPI/Subjective: Upset about medication changes, unanswered questions. Per RN, very agitated 9/11 pm. Received ativan.  Overall, breathing, pleuritic pain, dyspnea better. C/o orthopnea. Cough better. No wheeze.  Objective: Blood pressure 118/50, pulse 78, temperature 98.9 F (37.2 C), temperature  source Axillary, resp. rate 20, height '5\' 5"'  (1.651 m), weight 90.5  kg (199 lb 8.3 oz), SpO2 96.00%.  Intake/Output Summary (Last 24 hours) at 10/08/13 0801 Last data filed at 10/08/13 0700  Gross per 24 hour  Intake 2013.33 ml  Output    600 ml  Net 1413.33 ml     Exam: Gen: in chair. Appears more comfortable, but guarded, accusatory and angry  Chest: rales at right base no WR Cardiac: Regular rate and rhythm, S1-S2,  no rubs murmurs or gallops, no peripheral edema, no JVD Abdomen: Soft nontender nondistended without obvious hepatosplenomegaly, no ascites Extremities: Symmetrical in appearance without cyanosis, clubbing or edema  Scheduled Meds:  Scheduled Meds: . acetylcysteine  4 mL Nebulization Once  . acyclovir  400 mg Oral BID  . albuterol  5 mg Nebulization Once  . aspirin  325 mg Oral Daily  . buPROPion  300 mg Oral Daily  . ceFEPime (MAXIPIME) IV  1 g Intravenous Q8H  . heparin  5,000 Units Subcutaneous 3 times per day  . ipratropium  0.5 mg Nebulization Once  . multivitamin with minerals  1 tablet Oral Daily  . pantoprazole  80 mg Oral Q1200  . sertraline  100 mg Oral Daily  . simvastatin  40 mg Oral QPM  . vancomycin  750 mg Intravenous Q12H   Continuous Infusions: . sodium chloride 150 mL/hr at 10/07/13 1500    Data Reviewed: Basic Metabolic Panel:  Recent Labs Lab 10/06/13 1528 10/06/13 2041 10/07/13 0100 10/07/13 0930  NA 131*  --  138 135*  K 3.7  --  4.3 4.3  CL 104  --  102 100  CO2  --   --  21 20  GLUCOSE 130*  --  104* 95  BUN 12  --  17 21  CREATININE 0.90 0.77 0.98 1.00  CALCIUM  --   --  8.4 8.4   Liver Function Tests:  Recent Labs Lab 10/07/13 0100  AST 23  ALT 37*  ALKPHOS 131*  BILITOT 0.5  PROT 6.3  ALBUMIN 2.5*   No results found for this basename: LIPASE, AMYLASE,  in the last 168 hours No results found for this basename: AMMONIA,  in the last 168 hours CBC:  Recent Labs Lab 10/06/13 1409 10/06/13 1528  10/06/13 2041 10/07/13 0100 10/07/13 0930  WBC 4.6  --  3.6* 3.6* 1.8*  NEUTROABS 3.4  --   --  2.8 1.3*  HGB 9.6* 10.2* 9.1* 8.9* 8.5*  HCT 30.0* 30.0* 27.4* 27.1* 26.1*  MCV 97.4  --  94.5 95.8 95.6  PLT 147*  --  137* 128* 122*   Cardiac Enzymes:  Recent Labs Lab 10/06/13 2040 10/07/13 0100 10/07/13 0700  TROPONINI <0.30 <0.30 <0.30   BNP (last 3 results)  Recent Labs  10/06/13 1409  PROBNP 597.9*   CBG:  Recent Labs Lab 10/06/13 2312 10/07/13 0510 10/07/13 0739 10/07/13 1156 10/07/13 1625  GLUCAP 93 87 106* 106* 102*    Recent Results (from the past 240 hour(s))  MRSA PCR SCREENING     Status: None   Collection Time    10/06/13 10:20 PM      Result Value Ref Range Status   MRSA by PCR NEGATIVE  NEGATIVE Final   Comment:            The GeneXpert MRSA Assay (FDA     approved for NASAL specimens     only), is one component of a     comprehensive MRSA colonization     surveillance program. It is  not     intended to diagnose MRSA     infection nor to guide or     monitor treatment for     MRSA infections.     Studies:  Time spent :  Doree Barthel, MD [facility] [phone]   If 7PM-7AM, please contact night-coverage [email or web address] Password [email or web address] 10/08/2013, 8:01 AM   LOS: 2 days

## 2013-10-08 NOTE — ED Provider Notes (Signed)
I have reviewed the documentation of the resident and agree.   Sharyon Cable, MD 10/08/13 [phone]

## 2013-10-09 DIAGNOSIS — J810 Acute pulmonary edema: Secondary | ICD-10-CM

## 2013-10-09 DIAGNOSIS — D61818 Other pancytopenia: Secondary | ICD-10-CM

## 2013-10-09 LAB — CBC WITH DIFFERENTIAL/PLATELET
BASOS ABS: 0 10*3/uL (ref 0.0–0.1)
BASOS PCT: 1 % (ref 0–1)
EOS ABS: 0 10*3/uL (ref 0.0–0.7)
Eosinophils Relative: 1 % (ref 0–5)
HCT: 24.1 % — ABNORMAL LOW (ref 36.0–46.0)
Hemoglobin: 7.8 g/dL — ABNORMAL LOW (ref 12.0–15.0)
Lymphocytes Relative: 25 % (ref 12–46)
Lymphs Abs: 0.6 10*3/uL — ABNORMAL LOW (ref 0.7–4.0)
MCH: 29.9 pg (ref 26.0–34.0)
MCHC: 32.4 g/dL (ref 30.0–36.0)
MCV: 92.3 fL (ref 78.0–100.0)
MONOS PCT: 16 % — AB (ref 3–12)
Monocytes Absolute: 0.4 10*3/uL (ref 0.1–1.0)
Neutro Abs: 1.3 10*3/uL — ABNORMAL LOW (ref 1.7–7.7)
Neutrophils Relative %: 57 % (ref 43–77)
Platelets: 139 10*3/uL — ABNORMAL LOW (ref 150–400)
RBC: 2.61 MIL/uL — ABNORMAL LOW (ref 3.87–5.11)
RDW: 14.1 % (ref 11.5–15.5)
WBC: 2.4 10*3/uL — ABNORMAL LOW (ref 4.0–10.5)

## 2013-10-09 LAB — URINE CULTURE
COLONY COUNT: NO GROWTH
CULTURE: NO GROWTH

## 2013-10-09 MED ORDER — METOPROLOL SUCCINATE ER 50 MG PO TB24
50.0000 mg | ORAL_TABLET | Freq: Every day | ORAL | Status: DC
  Administered 2013-10-09 – 2013-10-13 (×5): 50 mg via ORAL
  Filled 2013-10-09 (×5): qty 1

## 2013-10-09 MED ORDER — FUROSEMIDE 10 MG/ML IJ SOLN
40.0000 mg | Freq: Once | INTRAMUSCULAR | Status: AC
  Administered 2013-10-09: 40 mg via INTRAVENOUS
  Filled 2013-10-09: qty 4

## 2013-10-09 NOTE — Progress Notes (Addendum)
[facility]  MISAKO ROEDER BWL:[id number] DOB: 03-15-1945 DOA: 10/06/2013 PCP: [name], MD   Brief narrative: 68 year old female patient currently undergoing chemotherapy treatment for multiple myeloma. 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 She also has chronic problems related to hypertension diabetes. She presented to the hospital because of chest pain left-sided that was fairly constant in nature. It seemed to be worse with coughing and deep breathing. She denied any issues related to fever or shortness of breath. She endorses mild sore throat upper respiratory symptoms one week prior that had resolved. Initially presented to her primary care physician's office with these complaints was noted to be tachycardic and hypoxic and was directed to the ER for further evaluation. Because of her underlying complaints he there was concern for possible pulmonary embolism.  In the ER she had a low-grade temperature with blood pressure readings in the 68-115 systolic range. Her O2 saturations were in the mid 90s on room. Her white count was 4600. Platelets 140,000 which are lower than normal for her. EKG was sinus rhythm. CTA was negative for PE but did show bilateral patchy airspace disease concerning for infiltrate/pneumonia there was also mildly enlarged lymph nodes that appear to be reactive in nature.  Assessment/Plan: Active Problems:   Sepsis Blood pressures normal for >48 hours      Acute respiratory failure with hypoxia/pneumonia in imunocompromiswed/Pleurisy Continue cefepime, vancomycin. Symptoms improving, but still requiring oxygen.  Acute pulmonary edema, from fluid overload: normal EF and no h/o CHF.  Received a dose of lasix yesterday. Will give another dose today.    Status post lobectomy of lung secondary to carcinoid  Multiple myeloma revlimid held. Left courtesy message with onc on  Friday.      Dehydration/hypotension resolved  Pancytopenia: WBC and platelet count recovering. hgb borderline. Monitor. Has not required transfusion    HYPERTENSION with mild concentric hypertrophy meds held. Resume as able   Pulmonary hypertension -Noted on echo this admission with pressure of 51 mmHg   Abnormal Hemeglobin A1c -6.0  GERD: takes bid ppi.   Extensive discussion about test results, medications, progress, plans today. Transfer to tele. PT eval tomorrow. Wean oxygen as able. Still very weak  DVT prophylaxis: Subcutaneous heparin Code Status: Full Family Communication: brother 9/11 by phone Disposition Plan/Expected LOS: transfer to tele  Consultants: None  Procedures: 2-D echocardiogram: - Left ventricle: The cavity size was normal. There was mild concentric hypertrophy. Systolic function was normal. The estimated ejection fraction was in the range of 55% to 60%. Wall motion was normal; there were no regional wall motion abnormalities. - Mitral valve: There was trivial regurgitation. - Left atrium: The atrium was moderately dilated. - Tricuspid valve: There was mild regurgitation. - Pulmonic valve: There was trivial regurgitation. - Pulmonary arteries: PA peak pressure: 51 mm Hg (S). - The right ventricular systolic pressure was increased consistent with moderate pulmonary hypertension.  Cultures: 9/10 blood cultures x2 pending 9/11 urine culture  Antibiotics: Acyclovir 9/10 >>> Cefepime 9/11 >>> Levaquin 9/11 >>> Vancomycin 9/11 >>> Rocephin 9/10 x 1 dose Zithromax 9/10 x 1 dose  HPI/Subjective: Pain, cough breathing continues to improve. Very weak. Stuffy nose  Objective: Blood pressure 142/67, pulse 84, temperature 98.3 F (36.8 C), temperature source Oral, resp. rate 28, height '5\' 5"'  (1.651 m), weight 89.7 kg (197 lb 12 oz), SpO2 96.00%.  Intake/Output Summary (Last 24 hours) at 10/09/13  [place] filed at 10/09/13 0820  Gross per  24 hour  Intake    150 ml  Output    300 ml  Net   -150 ml     Exam: Gen: in chair. Calm and cooperative Chest: rales at bases without WRR Cardiac: Regular rate and rhythm, S1-S2,  no rubs murmurs or gallops, no peripheral edema, no JVD Abdomen: Soft nontender nondistended without obvious hepatosplenomegaly, no ascites Extremities: Symmetrical in appearance without cyanosis, clubbing or edema  Scheduled Meds:  Scheduled Meds: . acetylcysteine  4 mL Nebulization Once  . acyclovir  400 mg Oral BID  . albuterol  5 mg Nebulization Once  . aspirin  325 mg Oral Daily  . buPROPion  300 mg Oral Daily  . ceFEPime (MAXIPIME) IV  1 g Intravenous Q8H  . furosemide  40 mg Intravenous Once  . heparin  5,000 Units Subcutaneous 3 times per day  . ipratropium  0.5 mg Nebulization Once  . multivitamin with minerals  1 tablet Oral Daily  . pantoprazole  40 mg Oral BID  . sertraline  100 mg Oral Daily  . simvastatin  40 mg Oral QPM  . vancomycin  1,250 mg Intravenous Q12H   Continuous Infusions: . sodium chloride 10 mL/hr at 10/08/13 0820    Data Reviewed: Basic Metabolic Panel:  Recent Labs Lab 10/06/13 1528 10/06/13 2041 10/07/13 0100 10/07/13 0930 10/08/13 0927  NA 131*  --  138 135* 134*  K 3.7  --  4.3 4.3 3.8  CL 104  --  102 100 103  CO2  --   --  '21 20 19  ' GLUCOSE 130*  --  104* 95 135*  BUN 12  --  '17 21 15  ' CREATININE 0.90 0.77 0.98 1.00 0.64  CALCIUM  --   --  8.4 8.4 8.3*   Liver Function Tests:  Recent Labs Lab 10/07/13 0100 10/08/13 0927  AST 23 36  ALT 37* 46*  ALKPHOS 131* 178*  BILITOT 0.5 0.4  PROT 6.3 5.9*  ALBUMIN 2.5* 2.2*   No results found for this basename: LIPASE, AMYLASE,  in the last 168 hours No results found for this basename: AMMONIA,  in the last 168 hours CBC:  Recent Labs Lab 10/06/13 1409  10/06/13 2041 10/07/13 0100 10/07/13 0930 10/08/13 0927 10/09/13 0656  WBC 4.6  --  3.6* 3.6* 1.8* 1.6* 2.4*  NEUTROABS 3.4  --   --   2.8 1.3* 1.1* 1.3*  HGB 9.6*  < > 9.1* 8.9* 8.5* 8.3* 7.8*  HCT 30.0*  < > 27.4* 27.1* 26.1* 25.5* 24.1*  MCV 97.4  --  94.5 95.8 95.6 94.1 92.3  PLT 147*  --  137* 128* 122* 126* 139*  < > = values in this interval not displayed. Cardiac Enzymes:  Recent Labs Lab 10/06/13 2040 10/07/13 0100 10/07/13 0700  TROPONINI <0.30 <0.30 <0.30   BNP (last 3 results)  Recent Labs  10/06/13 1409 10/08/13 0927  PROBNP 597.9* 1718.0*   CBG:  Recent Labs Lab 10/06/13 2312 10/07/13 0510 10/07/13 0739 10/07/13 1156 10/07/13 1625  GLUCAP 93 87 106* 106* 102*    Recent Results (from the past 240 hour(s))  CULTURE, BLOOD (ROUTINE X 2)     Status: None   Collection Time    10/06/13  8:34 PM      Result Value Ref Range Status   Specimen Description BLOOD FOREARM RIGHT   Final   Special Requests BOTTLES DRAWN AEROBIC ONLY  5CC   Final   Culture  Setup Time     Final   Value: 10/07/2013 01:03     Performed at [facility]   Culture     Final   Value:        BLOOD CULTURE RECEIVED NO GROWTH TO DATE CULTURE WILL BE HELD FOR 5 DAYS BEFORE ISSUING A FINAL NEGATIVE REPORT     Performed at [facility]   Report Status PENDING   Incomplete  CULTURE, BLOOD (ROUTINE X 2)     Status: None   Collection Time    10/06/13  8:41 PM      Result Value Ref Range Status   Specimen Description BLOOD HAND RIGHT   Final   Special Requests BOTTLES DRAWN AEROBIC AND ANAEROBIC 10CC   Final   Culture  Setup Time     Final   Value: 10/07/2013 01:02     Performed at [facility]   Culture     Final   Value:        BLOOD CULTURE RECEIVED NO GROWTH TO DATE CULTURE WILL BE HELD FOR 5 DAYS BEFORE ISSUING A FINAL NEGATIVE REPORT     Note: Culture results may be compromised due to an excessive volume of blood received in culture bottles.     Performed at [facility]   Report Status PENDING   Incomplete  MRSA PCR SCREENING     Status: None   Collection Time    10/06/13 10:20 PM        Result Value Ref Range Status   MRSA by PCR NEGATIVE  NEGATIVE Final   Comment:            The GeneXpert MRSA Assay (FDA     approved for NASAL specimens     only), is one component of a     comprehensive MRSA colonization     surveillance program. It is not     intended to diagnose MRSA     infection nor to guide or     monitor treatment for     MRSA infections.  URINE CULTURE     Status: None   Collection Time    10/07/13 12:50 PM      Result Value Ref Range Status   Specimen Description URINE, CATHETERIZED   Final   Special Requests NONE   Final   Culture  Setup Time     Final   Value: 10/08/2013 02:14     Performed at [place]     Final   Value: NO GROWTH     Performed at [facility]   Culture     Final   Value: NO GROWTH     Performed at [facility]   Report Status 10/09/2013 FINAL   Final     Time spent : 71 min  Doree Barthel, MD [facility] [phone]  If 7PM-7AM, please contact night-coverage [email or web address] Password [email or web address] 10/09/2013, 11:27 AM   LOS: 3 days

## 2013-10-10 LAB — BASIC METABOLIC PANEL
Anion gap: 13 (ref 5–15)
BUN: 11 mg/dL (ref 6–23)
CALCIUM: 9.1 mg/dL (ref 8.4–10.5)
CO2: 29 meq/L (ref 19–32)
CREATININE: 0.74 mg/dL (ref 0.50–1.10)
Chloride: 101 mEq/L (ref 96–112)
GFR calc Af Amer: >90 mL/min (ref >90)
GFR calc non Af Amer: 87 mL/min — ABNORMAL LOW (ref >90)
Glucose, Bld: 98 mg/dL (ref 70–99)
Potassium: 3.7 mEq/L (ref 3.7–5.3)
Sodium: 143 mEq/L (ref 137–147)

## 2013-10-10 LAB — CBC WITH DIFFERENTIAL/PLATELET
BASOS PCT: 1 % (ref 0–1)
Basophils Absolute: 0 10*3/uL (ref 0.0–0.1)
EOS PCT: 7 % — AB (ref 0–5)
Eosinophils Absolute: 0.2 10*3/uL (ref 0.0–0.7)
HEMATOCRIT: 24.7 % — AB (ref 36.0–46.0)
Hemoglobin: 8.3 g/dL — ABNORMAL LOW (ref 12.0–15.0)
LYMPHS PCT: 26 % (ref 12–46)
Lymphs Abs: 0.7 10*3/uL (ref 0.7–4.0)
MCH: 31.9 pg (ref 26.0–34.0)
MCHC: 33.6 g/dL (ref 30.0–36.0)
MCV: 95 fL (ref 78.0–100.0)
MONO ABS: 0.4 10*3/uL (ref 0.1–1.0)
Monocytes Relative: 12 % (ref 3–12)
Neutro Abs: 1.5 10*3/uL — ABNORMAL LOW (ref 1.7–7.7)
Neutrophils Relative %: 54 % (ref 43–77)
Platelets: 185 10*3/uL (ref 150–400)
RBC: 2.6 MIL/uL — ABNORMAL LOW (ref 3.87–5.11)
RDW: 14.2 % (ref 11.5–15.5)
WBC: 2.8 10*3/uL — ABNORMAL LOW (ref 4.0–10.5)

## 2013-10-10 LAB — TYPE AND SCREEN
ABO/RH(D): A POS
Antibody Screen: NEGATIVE

## 2013-10-10 LAB — ABO/RH: ABO/RH(D): A POS

## 2013-10-10 MED ORDER — OXYMETAZOLINE HCL 0.05 % NA SOLN
1.0000 | Freq: Two times a day (BID) | NASAL | Status: DC | PRN
  Administered 2013-10-10: 1 via NASAL
  Filled 2013-10-10: qty 15

## 2013-10-10 NOTE — Evaluation (Addendum)
Physical Therapy Evaluation Patient Details Name: SUHAYLAH WAMPOLE MRN: [id number] DOB: Jul 03, 1945 Today's Date: 10/10/2013   History of Present Illness  68 year old female patient currently undergoing chemotherapy treatment for multiple myeloma. 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 She also has chronic problems related to hypertension diabetes. She presented to the hospital because of chest pain left-sided that was fairly constant in nature. It seemed to be worse with coughing and deep breathing. She denied any issues related to fever or shortness of breath.negative for PE  Clinical Impression  Pt very pleasant and moving well but decreased oxygen saturations with activity without supplemental oxygen. Pt educated for energy conservation techniques and demonstrates decreased activity tolerance compared to baseline. Pt will benefit from acute therapy to maximize gait and maintain oxygen saturations as well as implement HEP. Anticipate quick recovery.     Follow Up Recommendations Other (comment) (outpatient cardiopulmonary rehab)    Equipment Recommendations  None recommended by PT    Recommendations for Other Services       Precautions / Restrictions Precautions Precaution Comments: watch sats      Mobility  Bed Mobility Overal bed mobility: Modified Independent                Transfers Overall transfer level: Modified independent                  Ambulation/Gait Ambulation/Gait assistance: Supervision;Modified independent (Device/Increase time) Ambulation Distance (Feet): 200 Feet Assistive device: None Gait Pattern/deviations: WFL(Within Functional Limits)   Gait velocity interpretation: Below normal speed for age/gender General Gait Details: sats dropping to 85% on 3L with gait  Stairs            Wheelchair Mobility    Modified Rankin (Stroke Patients Only)       Balance Overall balance  assessment: No apparent balance deficits (not formally assessed)                                           Pertinent Vitals/Pain Pain Assessment: No/denies pain sats 96% on 2L at rest Drop to 79% on RA walking to bathroom On 3L with gait dropping to 85% and able to recover to 95% on 3L HR 82     Home Living Family/patient expects to be discharged to:: Private residence Living Arrangements: Alone   Type of Home: House Home Access: Stairs to enter   Technical brewer of Steps: 4 Home Layout: Multi-level Home Equipment: None      Prior Function Level of Independence: Independent         Comments: pt has a housekeeper but otherwise does all ADLs for herself with increased time due to fatigue after chemo and stem cell transplant     Hand Dominance        Extremity/Trunk Assessment   Upper Extremity Assessment: Overall WFL for tasks assessed           Lower Extremity Assessment: Generalized weakness      Cervical / Trunk Assessment: Normal  Communication   Communication: No difficulties  Cognition Arousal/Alertness: Awake/alert Behavior During Therapy: WFL for tasks assessed/performed Overall Cognitive Status: Within Functional Limits for tasks assessed                      General Comments      Exercises  Assessment/Plan    PT Assessment Patient needs continued PT services  PT Diagnosis Difficulty walking   PT Problem List Decreased activity tolerance;Cardiopulmonary status limiting activity  PT Treatment Interventions Patient/family education;Functional mobility training;Therapeutic activities   PT Goals (Current goals can be found in the Care Plan section) Acute Rehab PT Goals Patient Stated Goal: be able to take care of myself PT Goal Formulation: With patient Time For Goal Achievement: 10/17/13 Potential to Achieve Goals: Good    Frequency Min 3X/week   Barriers to discharge Decreased caregiver  support      Co-evaluation               End of Session   Activity Tolerance: Patient tolerated treatment well Patient left: in chair;with call bell/phone within reach Nurse Communication: Mobility status         Time: 6016-5800 PT Time Calculation (min): 25 min   Charges:   PT Evaluation $Initial PT Evaluation Tier I: 1 Procedure PT Treatments $Therapeutic Activity: 8-22 mins   PT G Codes:          Melford Aase 10/10/2013, 11:24 AM Elwyn Reach, [place]

## 2013-10-10 NOTE — Progress Notes (Addendum)
[facility]  [name] DJS:[id number] DOB: 20-Apr-1945 DOA: 10/06/2013 PCP: Jonathon Bellows, MD   Brief narrative: 68 year old female patient currently undergoing chemotherapy treatment for multiple myeloma. 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 She also has chronic problems related to hypertension diabetes. She presented to the hospital because of chest pain left-sided that was fairly constant in nature. It seemed to be worse with coughing and deep breathing. She denied any issues related to fever or shortness of breath. She endorses mild sore throat upper respiratory symptoms one week prior that had resolved. Initially presented to her primary care physician's office with these complaints was noted to be tachycardic and hypoxic and was directed to the ER for further evaluation. Because of her underlying complaints he there was concern for possible pulmonary embolism.  In the ER she had a low-grade temperature with blood pressure readings in the 88-502 systolic range. Her O2 saturations were in the mid 90s on room. Her white count was 4600. Platelets 140,000 which are lower than normal for her. EKG was sinus rhythm. CTA was negative for PE but did show bilateral patchy airspace disease concerning for infiltrate/pneumonia there was also mildly enlarged lymph nodes that appear to be reactive in nature.  Assessment/Plan: Active Problems:   Sepsis Blood pressures normal for >48 hours      Acute respiratory failure with hypoxia/pneumonia in imunocompromiswed/Pleurisy Continue cefepime, vancomycin. Symptoms improving, but still requiring oxygen.  Acute pulmonary edema, from fluid overload: normal EF and no h/o CHF.  Cont Lasix and follow I and O    Status post lobectomy of lung secondary to carcinoid - stable  Multiple myeloma revlimid held. Left courtesy message with onc on Friday.     Dehydration/hypotension resolved  Pancytopenia likely from MM WBC and platelet count recovering.   Has not required transfusion    HYPERTENSION with mild concentric hypertrophy meds held. Resume as able   Pulmonary hypertension -Noted on echo this admission with pressure of 51 mmHg  Glucose intolerance Hemeglobin A1c- 6.0 -cont to follow sugars  GERD: takes bid ppi.    DVT prophylaxis: Subcutaneous heparin Code Status: Full Family Communication: brother 9/11 by phone Disposition Plan/Expected LOS: PT eval  Consultants: None  Procedures: 2-D echocardiogram: - Left ventricle: The cavity size was normal. There was mild concentric hypertrophy. Systolic function was normal. The estimated ejection fraction was in the range of 55% to 60%. Wall motion was normal; there were no regional wall motion abnormalities. - Mitral valve: There was trivial regurgitation. - Left atrium: The atrium was moderately dilated. - Tricuspid valve: There was mild regurgitation. - Pulmonic valve: There was trivial regurgitation. - Pulmonary arteries: PA peak pressure: 51 mm Hg (S). - The right ventricular systolic pressure was increased consistent with moderate pulmonary hypertension.  Cultures: 9/10 blood cultures x2 pending 9/11 urine culture  Antibiotics: Acyclovir 9/10 >>> Cefepime 9/11 >>> Levaquin 9/11 >>> Vancomycin 9/11 >>> Rocephin 9/10 x 1 dose Zithromax 9/10 x 1 dose  HPI/Subjective: Pain, cough breathing continues to improve. Very weak. Stuffy nose  Objective: Blood pressure 128/59, pulse 71, temperature 98.7 F (37.1 C), temperature source Oral, resp. rate 18, height '5\' 5"'  (1.651 m), weight 89.359 kg (197 lb), SpO2 93.00%.  Intake/Output Summary (Last 24 hours) at 10/10/13 1115 Last data filed at 10/09/13 1957  Gross per 24 hour  Intake    360 ml  Output  2200 ml  Net  -1840 ml     Exam: Gen: in chair. Calm and cooperative Chest: rales at bases without  WRR Cardiac: Regular rate and rhythm, S1-S2,  no rubs murmurs or gallops, no peripheral edema, no JVD Abdomen: Soft nontender nondistended without obvious hepatosplenomegaly, no ascites Extremities: Symmetrical in appearance without cyanosis, clubbing or edema  Scheduled Meds:  Scheduled Meds: . acetylcysteine  4 mL Nebulization Once  . acyclovir  400 mg Oral BID  . albuterol  5 mg Nebulization Once  . aspirin  325 mg Oral Daily  . buPROPion  300 mg Oral Daily  . ceFEPime (MAXIPIME) IV  1 g Intravenous Q8H  . heparin  5,000 Units Subcutaneous 3 times per day  . ipratropium  0.5 mg Nebulization Once  . metoprolol succinate  50 mg Oral Daily  . multivitamin with minerals  1 tablet Oral Daily  . pantoprazole  40 mg Oral BID  . sertraline  100 mg Oral Daily  . simvastatin  40 mg Oral QPM  . vancomycin  1,250 mg Intravenous Q12H   Continuous Infusions: . sodium chloride 10 mL/hr at 10/08/13 0820    Data Reviewed: Basic Metabolic Panel:  Recent Labs Lab 10/06/13 1528 10/06/13 2041 10/07/13 0100 10/07/13 0930 10/08/13 0927 10/10/13 0726  NA 131*  --  138 135* 134* 143  K 3.7  --  4.3 4.3 3.8 3.7  CL 104  --  102 100 103 101  CO2  --   --  '21 20 19 29  ' GLUCOSE 130*  --  104* 95 135* 98  BUN 12  --  '17 21 15 11  ' CREATININE 0.90 0.77 0.98 1.00 0.64 0.74  CALCIUM  --   --  8.4 8.4 8.3* 9.1   Liver Function Tests:  Recent Labs Lab 10/07/13 0100 10/08/13 0927  AST 23 36  ALT 37* 46*  ALKPHOS 131* 178*  BILITOT 0.5 0.4  PROT 6.3 5.9*  ALBUMIN 2.5* 2.2*   No results found for this basename: LIPASE, AMYLASE,  in the last 168 hours No results found for this basename: AMMONIA,  in the last 168 hours CBC:  Recent Labs Lab 10/07/13 0100 10/07/13 0930 10/08/13 0927 10/09/13 0656 10/10/13 0726  WBC 3.6* 1.8* 1.6* 2.4* 2.8*  NEUTROABS 2.8 1.3* 1.1* 1.3* 1.5*  HGB 8.9* 8.5* 8.3* 7.8* 8.3*  HCT 27.1* 26.1* 25.5* 24.1* 24.7*  MCV 95.8 95.6 94.1 92.3 95.0  PLT 128*  122* 126* 139* 185   Cardiac Enzymes:  Recent Labs Lab 10/06/13 2040 10/07/13 0100 10/07/13 0700  TROPONINI <0.30 <0.30 <0.30   BNP (last 3 results)  Recent Labs  10/06/13 1409 10/08/13 0927  PROBNP 597.9* 1718.0*   CBG:  Recent Labs Lab 10/06/13 2312 10/07/13 0510 10/07/13 0739 10/07/13 1156 10/07/13 1625  GLUCAP 93 87 106* 106* 102*    Recent Results (from the past 240 hour(s))  CULTURE, BLOOD (ROUTINE X 2)     Status: None   Collection Time    10/06/13  8:34 PM      Result Value Ref Range Status   Specimen Description BLOOD FOREARM RIGHT   Final   Special Requests BOTTLES DRAWN AEROBIC ONLY 5CC   Final   Culture  Setup Time     Final   Value: 10/07/2013 01:03     Performed at [facility]   Culture     Final   Value:        BLOOD CULTURE RECEIVED NO  GROWTH TO DATE CULTURE WILL BE HELD FOR 5 DAYS BEFORE ISSUING A FINAL NEGATIVE REPORT     Performed at [facility]   Report Status PENDING   Incomplete  CULTURE, BLOOD (ROUTINE X 2)     Status: None   Collection Time    10/06/13  8:41 PM      Result Value Ref Range Status   Specimen Description BLOOD HAND RIGHT   Final   Special Requests BOTTLES DRAWN AEROBIC AND ANAEROBIC 10CC   Final   Culture  Setup Time     Final   Value: 10/07/2013 01:02     Performed at [facility]   Culture     Final   Value:        BLOOD CULTURE RECEIVED NO GROWTH TO DATE CULTURE WILL BE HELD FOR 5 DAYS BEFORE ISSUING A FINAL NEGATIVE REPORT     Note: Culture results may be compromised due to an excessive volume of blood received in culture bottles.     Performed at [facility]   Report Status PENDING   Incomplete  MRSA PCR SCREENING     Status: None   Collection Time    10/06/13 10:20 PM      Result Value Ref Range Status   MRSA by PCR NEGATIVE  NEGATIVE Final   Comment:            The GeneXpert MRSA Assay (FDA     approved for NASAL specimens     only), is one component of a      comprehensive MRSA colonization     surveillance program. It is not     intended to diagnose MRSA     infection nor to guide or     monitor treatment for     MRSA infections.  URINE CULTURE     Status: None   Collection Time    10/07/13 12:50 PM      Result Value Ref Range Status   Specimen Description URINE, CATHETERIZED   Final   Special Requests NONE   Final   Culture  Setup Time     Final   Value: 10/08/2013 02:14     Performed at [facility]     Final   Value: NO GROWTH     Performed at [facility]   Culture     Final   Value: NO GROWTH     Performed at [facility]   Report Status 10/09/2013 FINAL   Final     Time spent : 35 min  [facility]  If 7PM-7AM, please contact night-coverage [email or web address] Password [email or web address] 10/10/2013, 11:15 AM   LOS: 4 days

## 2013-10-10 NOTE — Care Management Note (Signed)
    Page 1 of 1   10/13/2013     9:51:52 AM CARE MANAGEMENT NOTE 10/13/2013  Patient:  DAMONI, ERKER   Account Number:  [id number]  Date Initiated:  10/10/2013  Documentation initiated by:  GRAVES-BIGELOW,Kiah Keay  Subjective/Objective Assessment:   Pt admitted for sepsis and Acute resp failure. Continues on IV ABX therapy.     Action/Plan:   CM will continue to monitor for disposition needs.   Anticipated DC Date:  10/12/2013   Anticipated DC Plan:  [place]  CM consult      Choice offered to / List presented to:             Status of service:  Completed, signed off Medicare Important Message given?  YES (If response is "NO", the following Medicare IM given date fields will be blank) Date Medicare IM given:  10/10/2013 Medicare IM given by:  GRAVES-BIGELOW,Nick Stults Date Additional Medicare IM given:  10/12/2013 Additional Medicare IM given by:  Alize Acy GRAVES-BIGELOW  Discharge Disposition:  HOME/SELF CARE  Per UR Regulation:  Reviewed for med. necessity/level of care/duration of stay  If discussed at [place] of Stay Meetings, dates discussed:   10/13/2013    Comments:  10-12-13 [address] RN,BSN [phone] Acute pulmonary edema, from fluid overload- Cont Lasix per MD notes. PT recommends Outpatient PT (outpt cardiopulmonary rehab). CM did call to the [facility] will need an order from cardiologist to be seen and evaluated.  Pt will need to have a diagnosis of heart disease listed before being seen. No further needs from CM at this time.

## 2013-10-10 NOTE — Clinical Documentation Improvement (Signed)
Possible Clinical Conditions?  Chemo induced Pancytopenia  Pancytopenia Thrombocytopenia Other Condition Cannot Clinically Determine   Supporting Information: (As per notes) "patient immunocompromised and & chemotherapy" Diagnostics: Component     Latest Ref Rng 10/06/2013 10/07/2013 10/07/2013 10/08/2013         8:41 PM  1:00 AM  9:30 AM   WBC     4.0 - 10.5 K/uL 3.6 (L) 3.6 (L) 1.8 (L) 1.6 (L)  RBC     3.87 - 5.11 MIL/uL 2.90 (L) 2.83 (L) 2.73 (L) 2.71 (L)  Hemoglobin     12.0 - 15.0 g/dL 9.1 (L) 8.9 (L) 8.5 (L) 8.3 (L)  HCT     36.0 - 46.0 % 27.4 (L) 27.1 (L) 26.1 (L) 25.5 (L)  Platelets     150 - 400 K/uL 137 (L) 128 (L) 122 (L) 126 (L)  NEUT#     1.7 - 7.7 K/uL  2.8 1.3 (L) 1.1 (L)   Component     Latest Ref Rng 10/09/2013 10/10/2013  WBC     4.0 - 10.5 K/uL 2.4 (L) 2.8 (L)  RBC     3.87 - 5.11 MIL/uL 2.61 (L) 2.60 (L)  Hemoglobin     12.0 - 15.0 g/dL 7.8 (L) 8.3 (L)  HCT     36.0 - 46.0 % 24.1 (L) 24.7 (L)  MCV     78.0 - 100.0 fL 92.3 95.0  MCH     26.0 - 34.0 pg 29.9 31.9  RDW     11.5 - 15.5 % 14.1 14.2  Platelets     150 - 400 K/uL 139 (L) 185  NEUT#     1.7 - 7.7 K/uL 1.3 (L) 1.5 (L)   Thank You, Alessandra Grout, RN, BSN, CCDS,Clinical Documentation Specialist:  [phone]  [phone]=[facility]- Health Information Management

## 2013-10-11 ENCOUNTER — Other Ambulatory Visit: Payer: Medicare Other

## 2013-10-11 ENCOUNTER — Ambulatory Visit: Payer: Medicare Other

## 2013-10-11 ENCOUNTER — Inpatient Hospital Stay (HOSPITAL_COMMUNITY): Payer: Medicare Other

## 2013-10-11 LAB — CBC WITH DIFFERENTIAL/PLATELET
BASOS PCT: 2 % — AB (ref 0–1)
Basophils Absolute: 0 10*3/uL (ref 0.0–0.1)
Eosinophils Absolute: 0.2 10*3/uL (ref 0.0–0.7)
Eosinophils Relative: 10 % — ABNORMAL HIGH (ref 0–5)
HCT: 26.6 % — ABNORMAL LOW (ref 36.0–46.0)
HEMOGLOBIN: 8.7 g/dL — AB (ref 12.0–15.0)
LYMPHS ABS: 0.6 10*3/uL — AB (ref 0.7–4.0)
LYMPHS PCT: 25 % (ref 12–46)
MCH: 30.6 pg (ref 26.0–34.0)
MCHC: 32.7 g/dL (ref 30.0–36.0)
MCV: 93.7 fL (ref 78.0–100.0)
MONO ABS: 0.3 10*3/uL (ref 0.1–1.0)
MONOS PCT: 14 % — AB (ref 3–12)
NEUTROS ABS: 1.1 10*3/uL — AB (ref 1.7–7.7)
Neutrophils Relative %: 49 % (ref 43–77)
Platelets: 195 10*3/uL (ref 150–400)
RBC: 2.84 MIL/uL — AB (ref 3.87–5.11)
RDW: 14.2 % (ref 11.5–15.5)
WBC: 2.3 10*3/uL — ABNORMAL LOW (ref 4.0–10.5)

## 2013-10-11 LAB — GLUCOSE, CAPILLARY: Glucose-Capillary: 177 mg/dL — ABNORMAL HIGH (ref 70–99)

## 2013-10-11 LAB — VANCOMYCIN, TROUGH: Vancomycin Tr: 19 ug/mL (ref 10.0–20.0)

## 2013-10-11 MED ORDER — FUROSEMIDE 10 MG/ML IJ SOLN
40.0000 mg | Freq: Two times a day (BID) | INTRAMUSCULAR | Status: DC
  Administered 2013-10-11 – 2013-10-13 (×4): 40 mg via INTRAVENOUS
  Filled 2013-10-11 (×7): qty 4

## 2013-10-11 NOTE — Progress Notes (Addendum)
ANTIBIOTIC CONSULT NOTE - FOLLOW UP  Pharmacy Consult for Vancomycin and Cefepime Indication: pneumonia  No Known Allergies  Patient Measurements: Height: 5\' 5"  (165.1 cm) Weight: 197 lb (89.359 kg) IBW/kg (Calculated) : 57  Vital Signs: Temp: 98.4 F (36.9 C) (09/15 0500) BP: 134/89 mmHg (09/15 0500) Pulse Rate: 64 (09/15 0500) Intake/Output from previous day: 09/14 0701 - 09/15 0700 In: 1190 [P.O.:840; IV Piggyback:350] Out: -  Intake/Output from this shift:    Labs:  Recent Labs  10/09/13 0656 10/10/13 0726 10/11/13 0550  WBC 2.4* 2.8* 2.3*  HGB 7.8* 8.3* 8.7*  PLT 139* 185 195  CREATININE  --  0.74  --    Estimated Creatinine Clearance: 76.4 ml/min (by C-G formula based on Cr of 0.74).  Assessment: 67yof continues on day #5 vancomycin and cefepime for pneumonia. Cultures negative. Renal function stable. CXR actually now more consistent with pulmonary edema.  Levaquin 9/10 x1 Vancomycin 9/11>> Cefepime 9/11>> Rocephin 9/10 x 1 dsoe Zithromax 9/10 x 1 dose  Home acyclovir>>  9/10 blood cx: NGTD 9/11 urine: negative, final  Goal of Therapy:  Vancomycin trough level 15-20 mcg/ml  Plan:  1) Continue vancomycin 1250mg  IV q12 - trough tonight at 2030 2) Continue cefepime 1g IV q8 3) Follow up LOT  Deboraha Sprang 10/11/2013,12:40 PM    10/11/2013 10:19 PM Vancomycin trough this PM = 19 (goal 15-20) Continue Vancomycin 1250 mg IV q12h Follow up antibiotic length of therapy.  [employer], Pharm.D., BCPS Clinical Pharmacist Pager [phone] 10/11/2013 10:20 PM

## 2013-10-11 NOTE — Progress Notes (Signed)
Patient ambulated 300 feet with RN mild SOB noted, 02 sat 94% while ambulating 96% at rest.

## 2013-10-11 NOTE — Progress Notes (Addendum)
[facility]  [name] IDP:[id number] DOB: 01/30/1945 DOA: 10/06/2013 PCP: Jonathon Bellows, MD   Brief narrative: 68 year old female patient currently undergoing chemotherapy treatment for multiple myeloma. 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 She also has chronic problems related to hypertension diabetes. She presented to the hospital because of chest pain left-sided that was fairly constant in nature. It seemed to be worse with coughing and deep breathing. She denied any issues related to fever or shortness of breath. She endorses mild sore throat upper respiratory symptoms one week prior that had resolved. Initially presented to her primary care physician's office with these complaints was noted to be tachycardic and hypoxic and was directed to the ER for further evaluation. Because of her underlying complaints he there was concern for possible pulmonary embolism.  In the ER she had a low-grade temperature with blood pressure readings in the 44-315 systolic range. Her O2 saturations were in the mid 90s on room. Her white count was 4600. Platelets 140,000 which are lower than normal for her. EKG was sinus rhythm. CTA was negative for PE but did show bilateral patchy airspace disease concerning for infiltrate/pneumonia there was also mildly enlarged lymph nodes that appear to be reactive in nature.  Assessment/Plan: Active Problems:   Sepsis Due to pneumonia       Acute respiratory failure with hypoxia/pneumonia in imunocompromiswed/Pleurisy Continue cefepime, vancomycin.  - Symptoms improving, but still requiring oxygen.- check room air pulse ox today  Acute pulmonary edema, from fluid overload: - normal EF and no h/o CHF.   CXR reveals continued edema - Cont Lasix and follow I and O    Status post lobectomy of lung secondary to carcinoid - stable  Multiple myeloma revlimid held. Left courtesy  message with onc on Friday.      Dehydration/hypotension resolved  Pancytopenia likely from MM WBC and platelet count recovering.   Has not required transfusion    HYPERTENSION with mild concentric hypertrophy meds held. Resume as able   Pulmonary hypertension -Noted on echo this admission with pressure of 51 mmHg  Glucose intolerance Hemeglobin A1c- 6.0 -cont to follow sugars  GERD: takes bid ppi.    DVT prophylaxis: Subcutaneous heparin Code Status: Full Family Communication: brother 9/11 by phone Disposition Plan/Expected LOS: PT eval  Consultants: None  Procedures: 2-D echocardiogram: - Left ventricle: The cavity size was normal. There was mild concentric hypertrophy. Systolic function was normal. The estimated ejection fraction was in the range of 55% to 60%. Wall motion was normal; there were no regional wall motion abnormalities. - Mitral valve: There was trivial regurgitation. - Left atrium: The atrium was moderately dilated. - Tricuspid valve: There was mild regurgitation. - Pulmonic valve: There was trivial regurgitation. - Pulmonary arteries: PA peak pressure: 51 mm Hg (S). - The right ventricular systolic pressure was increased consistent with moderate pulmonary hypertension.  Cultures: 9/10 blood cultures x2 pending 9/11 urine culture  Antibiotics: Acyclovir 9/10 >>> Cefepime 9/11 >>> Levaquin 9/11 >>> Vancomycin 9/11 >>> Rocephin 9/10 x 1 dose Zithromax 9/10 x 1 dose  HPI/Subjective: Pain, cough breathing continues to improve. Very weak. Stuffy nose  Objective: Blood pressure 134/89, pulse 64, temperature 98.4 F (36.9 C), temperature source Oral, resp. rate 20, height '5\' 5"'  (1.651 m), weight 89.359 kg (197 lb), SpO2 97.00%.  Intake/Output Summary (Last 24 hours) at 10/11/13 1156 Last data filed at 10/11/13 0500  Gross per 24 hour  Intake    950 ml  Output      0 ml  Net    950 ml     Exam: Gen: in chair. Calm and  cooperative Chest: rales at bases without WRR Cardiac: Regular rate and rhythm, S1-S2,  no rubs murmurs or gallops, no peripheral edema, no JVD Abdomen: Soft nontender nondistended without obvious hepatosplenomegaly, no ascites Extremities: Symmetrical in appearance without cyanosis, clubbing or edema  Scheduled Meds:  Scheduled Meds: . acetylcysteine  4 mL Nebulization Once  . acyclovir  400 mg Oral BID  . albuterol  5 mg Nebulization Once  . aspirin  325 mg Oral Daily  . buPROPion  300 mg Oral Daily  . ceFEPime (MAXIPIME) IV  1 g Intravenous Q8H  . furosemide  40 mg Intravenous BID  . heparin  5,000 Units Subcutaneous 3 times per day  . ipratropium  0.5 mg Nebulization Once  . metoprolol succinate  50 mg Oral Daily  . multivitamin with minerals  1 tablet Oral Daily  . pantoprazole  40 mg Oral BID  . sertraline  100 mg Oral Daily  . simvastatin  40 mg Oral QPM  . vancomycin  1,250 mg Intravenous Q12H   Continuous Infusions: . sodium chloride 10 mL/hr at 10/08/13 0820    Data Reviewed: Basic Metabolic Panel:  Recent Labs Lab 10/06/13 1528 10/06/13 2041 10/07/13 0100 10/07/13 0930 10/08/13 0927 10/10/13 0726  NA 131*  --  138 135* 134* 143  K 3.7  --  4.3 4.3 3.8 3.7  CL 104  --  102 100 103 101  CO2  --   --  '21 20 19 29  ' GLUCOSE 130*  --  104* 95 135* 98  BUN 12  --  '17 21 15 11  ' CREATININE 0.90 0.77 0.98 1.00 0.64 0.74  CALCIUM  --   --  8.4 8.4 8.3* 9.1   Liver Function Tests:  Recent Labs Lab 10/07/13 0100 10/08/13 0927  AST 23 36  ALT 37* 46*  ALKPHOS 131* 178*  BILITOT 0.5 0.4  PROT 6.3 5.9*  ALBUMIN 2.5* 2.2*   No results found for this basename: LIPASE, AMYLASE,  in the last 168 hours No results found for this basename: AMMONIA,  in the last 168 hours CBC:  Recent Labs Lab 10/07/13 0930 10/08/13 0927 10/09/13 0656 10/10/13 0726 10/11/13 0550  WBC 1.8* 1.6* 2.4* 2.8* 2.3*  NEUTROABS 1.3* 1.1* 1.3* 1.5* 1.1*  HGB 8.5* 8.3* 7.8* 8.3* 8.7*   HCT 26.1* 25.5* 24.1* 24.7* 26.6*  MCV 95.6 94.1 92.3 95.0 93.7  PLT 122* 126* 139* 185 195   Cardiac Enzymes:  Recent Labs Lab 10/06/13 2040 10/07/13 0100 10/07/13 0700  TROPONINI <0.30 <0.30 <0.30   BNP (last 3 results)  Recent Labs  10/06/13 1409 10/08/13 0927  PROBNP 597.9* 1718.0*   CBG:  Recent Labs Lab 10/06/13 2312 10/07/13 0510 10/07/13 0739 10/07/13 1156 10/07/13 1625  GLUCAP 93 87 106* 106* 102*    Recent Results (from the past 240 hour(s))  CULTURE, BLOOD (ROUTINE X 2)     Status: None   Collection Time    10/06/13  8:34 PM      Result Value Ref Range Status   Specimen Description BLOOD FOREARM RIGHT   Final   Special Requests BOTTLES DRAWN AEROBIC ONLY 5CC   Final   Culture  Setup Time     Final   Value: 10/07/2013 01:03  Performed at [facility]     Final   Value:        BLOOD CULTURE RECEIVED NO GROWTH TO DATE CULTURE WILL BE HELD FOR 5 DAYS BEFORE ISSUING A FINAL NEGATIVE REPORT     Performed at [facility]   Report Status PENDING   Incomplete  CULTURE, BLOOD (ROUTINE X 2)     Status: None   Collection Time    10/06/13  8:41 PM      Result Value Ref Range Status   Specimen Description BLOOD HAND RIGHT   Final   Special Requests BOTTLES DRAWN AEROBIC AND ANAEROBIC 10CC   Final   Culture  Setup Time     Final   Value: 10/07/2013 01:02     Performed at [facility]   Culture     Final   Value:        BLOOD CULTURE RECEIVED NO GROWTH TO DATE CULTURE WILL BE HELD FOR 5 DAYS BEFORE ISSUING A FINAL NEGATIVE REPORT     Note: Culture results may be compromised due to an excessive volume of blood received in culture bottles.     Performed at [facility]   Report Status PENDING   Incomplete  MRSA PCR SCREENING     Status: None   Collection Time    10/06/13 10:20 PM      Result Value Ref Range Status   MRSA by PCR NEGATIVE  NEGATIVE Final   Comment:            The GeneXpert MRSA Assay (FDA      approved for NASAL specimens     only), is one component of a     comprehensive MRSA colonization     surveillance program. It is not     intended to diagnose MRSA     infection nor to guide or     monitor treatment for     MRSA infections.  URINE CULTURE     Status: None   Collection Time    10/07/13 12:50 PM      Result Value Ref Range Status   Specimen Description URINE, CATHETERIZED   Final   Special Requests NONE   Final   Culture  Setup Time     Final   Value: 10/08/2013 02:14     Performed at [facility]     Final   Value: NO GROWTH     Performed at [facility]   Culture     Final   Value: NO GROWTH     Performed at [facility]   Report Status 10/09/2013 FINAL   Final     Time spent : 35 min  [facility] If 7PM-7AM, please contact night-coverage [email or web address] Password [email or web address] 10/11/2013, 11:56 AM   LOS: 5 days

## 2013-10-12 ENCOUNTER — Other Ambulatory Visit: Payer: Self-pay | Admitting: *Deleted

## 2013-10-12 MED ORDER — LEVOFLOXACIN 750 MG PO TABS
750.0000 mg | ORAL_TABLET | ORAL | Status: DC
  Administered 2013-10-12 – 2013-10-13 (×2): 750 mg via ORAL
  Filled 2013-10-12 (×2): qty 1

## 2013-10-12 NOTE — Progress Notes (Signed)
[facility] PROGRESS NOTE Interim History: 68 year old female patient currently undergoing chemotherapy treatment for multiple myeloma. 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 She also has chronic problems related to hypertension diabetes. She presented to the hospital because of chest pain left-sided that was fairly constant in nature. It seemed to be worse with coughing and deep breathing. She denied any issues related to fever or shortness of breath. She endorses mild sore throat upper respiratory symptoms one week prior that had resolved. Initially presented to her primary care physician's office with these complaints was noted to be tachycardic and hypoxic and was directed to the ER for further evaluation. Because of her underlying complaints he there was concern for possible pulmonary embolism.    Assessment/Plan: Sepsis  Due to pneumonia.  Acute respiratory failure with hypoxia/pneumonia in imunocompromiswed/Pleurisy  - de-escalate treatment to levaquin monitor overnight. - started empirically on admission. - mild de sat to 86% with ambulation. Re-check in am  Acute pulmonary edema, from fluid overload:  - normal EF and no h/o CHF.  - Cont Lasix and follow I and O, NoJVD.  Status post lobectomy of lung secondary to carcinoid  - stable   Multiple myeloma  - revlimid held. Left courtesy message with onc on Friday.  - resume as an outpatient.  Dehydration/hypotension  resolved   Pancytopenia likely from MM  WBC and platelet count recovering.  Has not required transfusion.  HYPERTENSION with mild concentric hypertrophy  meds held. Resume as able   Pulmonary hypertension  -Noted on echo this admission with pressure of 51 mmHg. - follow up with cardiology as an outpatient  Glucose intolerance  Hemeglobin A1c- 6.0  -cont to follow sugars   GERD: takes bid ppi.   Code Status: full Family Communication: none    Disposition Plan: inpatinet   Consultants:  none  Procedures:  Echo  CXR  Antibiotics:  Vanc/cefepime  levaquin  HPI/Subjective: Feels better  Objective: Filed Vitals:   10/11/13 1939 10/11/13 2243 10/12/13 0519 10/12/13 0927  BP: 141/63  103/53 138/62  Pulse: 69  66 63  Temp: 97.9 F (36.6 C)  97.6 F (36.4 C) 98.1 F (36.7 C)  TempSrc: Oral  Oral Oral  Resp: 18  18   Height:      Weight:   90.81 kg (200 lb 3.2 oz)   SpO2: 92% 94% 97% 97%   No intake or output data in the 24 hours ending 10/12/13 1021 Filed Weights   10/09/13 0413 10/10/13 0500 10/12/13 0519  Weight: 89.7 kg (197 lb 12 oz) 89.359 kg (197 lb) 90.81 kg (200 lb 3.2 oz)    Exam:  General: Alert, awake, oriented x3, in no acute distress.  HEENT: No bruits, no goiter.  Heart: Regular rate and rhythm. Lungs: Good air movement, clear Abdomen: Soft, nontender, nondistended, positive bowel sounds.   Data Reviewed: Basic Metabolic Panel:  Recent Labs Lab 10/06/13 1528 10/06/13 2041 10/07/13 0100 10/07/13 0930 10/08/13 0927 10/10/13 0726  NA 131*  --  138 135* 134* 143  K 3.7  --  4.3 4.3 3.8 3.7  CL 104  --  102 100 103 101  CO2  --   --  '21 20 19 29  ' GLUCOSE 130*  --  104* 95 135* 98  BUN 12  --  '17 21 15 11  ' CREATININE 0.90 0.77 0.98 1.00 0.64 0.74  CALCIUM  --   --  8.4 8.4  8.3* 9.1   Liver Function Tests:  Recent Labs Lab 10/07/13 0100 10/08/13 0927  AST 23 36  ALT 37* 46*  ALKPHOS 131* 178*  BILITOT 0.5 0.4  PROT 6.3 5.9*  ALBUMIN 2.5* 2.2*   No results found for this basename: LIPASE, AMYLASE,  in the last 168 hours No results found for this basename: AMMONIA,  in the last 168 hours CBC:  Recent Labs Lab 10/07/13 0930 10/08/13 0927 10/09/13 0656 10/10/13 0726 10/11/13 0550  WBC 1.8* 1.6* 2.4* 2.8* 2.3*  NEUTROABS 1.3* 1.1* 1.3* 1.5* 1.1*  HGB 8.5* 8.3* 7.8* 8.3* 8.7*  HCT 26.1* 25.5* 24.1* 24.7* 26.6*  MCV 95.6 94.1 92.3 95.0 93.7  PLT 122* 126* 139*  185 195   Cardiac Enzymes:  Recent Labs Lab 10/06/13 2040 10/07/13 0100 10/07/13 0700  TROPONINI <0.30 <0.30 <0.30   BNP (last 3 results)  Recent Labs  10/06/13 1409 10/08/13 0927  PROBNP 597.9* 1718.0*   CBG:  Recent Labs Lab 10/07/13 0510 10/07/13 0739 10/07/13 1156 10/07/13 1625 10/11/13 1949  GLUCAP 87 106* 106* 102* 177*    Recent Results (from the past 240 hour(s))  CULTURE, BLOOD (ROUTINE X 2)     Status: None   Collection Time    10/06/13  8:34 PM      Result Value Ref Range Status   Specimen Description BLOOD FOREARM RIGHT   Final   Special Requests BOTTLES DRAWN AEROBIC ONLY 5CC   Final   Culture  Setup Time     Final   Value: 10/07/2013 01:03     Performed at [facility]   Culture     Final   Value:        BLOOD CULTURE RECEIVED NO GROWTH TO DATE CULTURE WILL BE HELD FOR 5 DAYS BEFORE ISSUING A FINAL NEGATIVE REPORT     Performed at [facility]   Report Status PENDING   Incomplete  CULTURE, BLOOD (ROUTINE X 2)     Status: None   Collection Time    10/06/13  8:41 PM      Result Value Ref Range Status   Specimen Description BLOOD HAND RIGHT   Final   Special Requests BOTTLES DRAWN AEROBIC AND ANAEROBIC 10CC   Final   Culture  Setup Time     Final   Value: 10/07/2013 01:02     Performed at [facility]   Culture     Final   Value:        BLOOD CULTURE RECEIVED NO GROWTH TO DATE CULTURE WILL BE HELD FOR 5 DAYS BEFORE ISSUING A FINAL NEGATIVE REPORT     Note: Culture results may be compromised due to an excessive volume of blood received in culture bottles.     Performed at [facility]   Report Status PENDING   Incomplete  MRSA PCR SCREENING     Status: None   Collection Time    10/06/13 10:20 PM      Result Value Ref Range Status   MRSA by PCR NEGATIVE  NEGATIVE Final   Comment:            The GeneXpert MRSA Assay (FDA     approved for NASAL specimens     only), is one component of a     comprehensive MRSA  colonization     surveillance program. It is not     intended to diagnose MRSA     infection nor to guide or  monitor treatment for     MRSA infections.  URINE CULTURE     Status: None   Collection Time    10/07/13 12:50 PM      Result Value Ref Range Status   Specimen Description URINE, CATHETERIZED   Final   Special Requests NONE   Final   Culture  Setup Time     Final   Value: 10/08/2013 02:14     Performed at [place]     Final   Value: NO GROWTH     Performed at [facility]   Culture     Final   Value: NO GROWTH     Performed at [facility]   Report Status 10/09/2013 FINAL   Final     Studies: Dg Chest Port 1 View  10/11/2013   CLINICAL DATA:  Pulmonary edema.  Short of breath.  EXAM: PORTABLE CHEST - 1 VIEW  COMPARISON:  10/08/2013.  FINDINGS: Support apparatus: Monitoring leads project over the chest.  Cardiomediastinal Silhouette:  Unchanged.  Lungs: Allowing for differences in technique, there is no interval change with bilateral interstitial and airspace opacity most compatible with pulmonary edema. No pneumothorax.  Effusions:  Possible small RIGHT pleural effusion.  Other:  None.  IMPRESSION: No interval change allowing for differences in technique from 10/08/2013. RIGHT-greater-than-LEFT interstitial and mild airspace opacity most consistent with pulmonary edema.   Electronically Signed   By: Dereck Ligas M.D.   On: 10/11/2013 08:13    Scheduled Meds: . acetylcysteine  4 mL Nebulization Once  . acyclovir  400 mg Oral BID  . albuterol  5 mg Nebulization Once  . aspirin  325 mg Oral Daily  . buPROPion  300 mg Oral Daily  . ceFEPime (MAXIPIME) IV  1 g Intravenous Q8H  . furosemide  40 mg Intravenous BID  . heparin  5,000 Units Subcutaneous 3 times per day  . ipratropium  0.5 mg Nebulization Once  . metoprolol succinate  50 mg Oral Daily  . multivitamin with minerals  1 tablet Oral Daily  . pantoprazole  40 mg Oral  BID  . sertraline  100 mg Oral Daily  . simvastatin  40 mg Oral QPM  . vancomycin  1,250 mg Intravenous Q12H   Continuous Infusions: . sodium chloride 10 mL/hr at 10/11/13 2122     Charlynne Cousins  [facility] Pager [phone]. If 8PM-8AM, please contact night-coverage at [email or web address], password [facility] 10/12/2013, 10:21 AM  LOS: 6 days

## 2013-10-12 NOTE — Progress Notes (Signed)
Physical Therapy Treatment Patient Details Name: [name] MRN: [id number] DOB: 11-11-1945 Today's Date: 10/12/2013    History of Present Illness 68 year old female patient currently undergoing chemotherapy treatment for multiple myeloma. 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 She also has chronic problems related to hypertension diabetes. She presented to the hospital because of chest pain left-sided that was fairly constant in nature. It seemed to be worse with coughing and deep breathing. She denied any issues related to fever or shortness of breath.negative for PE    PT Comments    Pt progressing with respiratory status with activity, ambulated on RA today, sats still dropped to 85% but able to recover to 95% within 3 mins without supplemental O2. Recommend oupt cardiopulmonary rehab for pt, is this is not a possibility, recommend outpt PT for exercise progressing with monitoring. PT will continue to follow.   Follow Up Recommendations  Outpatient PT (outpt cardiopulmonary rehab)     Equipment Recommendations  None recommended by PT    Recommendations for Other Services       Precautions / Restrictions Precautions Precaution Comments: watch sats Restrictions Weight Bearing Restrictions: No    Mobility  Bed Mobility               General bed mobility comments: pt received in chair  Transfers Overall transfer level: Modified independent Equipment used: None                Ambulation/Gait Ambulation/Gait assistance: Supervision Ambulation Distance (Feet): 225 Feet Assistive device: None Gait Pattern/deviations: WFL(Within Functional Limits) Gait velocity: decreased Gait velocity interpretation: <1.8 ft/sec, indicative of risk for recurrent falls General Gait Details: pt ambulated on RA today but sats down to 85%, pt symptomatic, rest given with instructions for no talking and proper breathing and sats  back to 95% within 3 mins, without supplemental O2   Stairs Stairs: Yes Stairs assistance: Supervision Stair Management: One rail Right;Alternating pattern;Forwards Number of Stairs: 3 General stair comments: number of stairs limited by IV line, safe with stairs but very winded afterwards  Wheelchair Mobility    Modified Rankin (Stroke Patients Only)       Balance Overall balance assessment: No apparent balance deficits (not formally assessed)                                  Cognition Arousal/Alertness: Awake/alert Behavior During Therapy: WFL for tasks assessed/performed Overall Cognitive Status: Within Functional Limits for tasks assessed                      Exercises Other Exercises Other Exercises: discussed activity progression and using her O2 sats monitor at home to guide activity Other Exercises: reviewed HEP and discussed exercises in combo with energy conservation techniques and how to make both happen    General Comments        Pertinent Vitals/Pain Pain Assessment: No/denies pain O2 sats 95% on RA at rest              85% with ambulation and 3 stairs              95% on RA with <3 mins rest    Home Living                      Prior Function  PT Goals (current goals can now be found in the care plan section) Acute Rehab PT Goals Patient Stated Goal: be able to take care of myself PT Goal Formulation: With patient Time For Goal Achievement: 10/17/13 Potential to Achieve Goals: Good Progress towards PT goals: Progressing toward goals    Frequency  Min 3X/week    PT Plan Current plan remains appropriate    Co-evaluation             End of Session Equipment Utilized During Treatment: Gait belt Activity Tolerance: Patient tolerated treatment well Patient left: in chair;with call bell/phone within reach     Time: 0950-1021 PT Time Calculation (min): 31 min  Charges:  $Gait Training: 8-22  mins $Therapeutic Activity: 8-22 mins                    G Codes:     Leighton Roach, PT  Acute Rehab Services  [phone]  Leighton Roach 10/12/2013, 11:27 AM

## 2013-10-12 NOTE — ED Provider Notes (Signed)
I saw and evaluated the patient, reviewed the resident's note and I agree with the findings and plan.   EKG Interpretation   Date/Time:  Thursday October 06 2013 13:42:45 EDT Ventricular Rate:  72 PR Interval:  147 QRS Duration: 84 QT Interval:  423 QTC Calculation: 463 R Axis:   85 Text Interpretation:  Sinus rhythm Borderline right axis deviation Minimal  ST depression, anterolateral leads artifact noted Confirmed by Christy Gentles   MD, DONALD ([id number]) on 10/06/2013 4:18:00 PM      68 y.o. with a pmh of HLP, HTN, multiple myeloma, lung cancer reportedly in remission who presents to the ED at the rec of her PCP for concern of a pulmonary embolism. ON exam she appears well. Afebrile. HD stable. She denies SOB to me. Pleuritic sounding CP. Doubt ACS. Doubt dissection. Will obtain CTa to eval for PE.    Virgel Manifold, MD 10/12/13 1215

## 2013-10-12 NOTE — Telephone Encounter (Signed)
Patient currently in hospital and Revlimid on hold as inpatient. Called [facility] and made them aware to put refill on hold till further notice. Refill request given to collaborative nurse.

## 2013-10-13 LAB — CULTURE, BLOOD (ROUTINE X 2)
Culture: NO GROWTH
Culture: NO GROWTH

## 2013-10-13 MED ORDER — LEVOFLOXACIN 750 MG PO TABS
750.0000 mg | ORAL_TABLET | ORAL | Status: DC
Start: 2013-10-13 — End: 2013-12-14

## 2013-10-13 MED ORDER — ESOMEPRAZOLE MAGNESIUM 40 MG PO CPDR: 40.0000 mg | DELAYED_RELEASE_CAPSULE | Freq: Two times a day (BID) | ORAL | Status: DC

## 2013-10-13 MED ORDER — POLYETHYLENE GLYCOL 3350 17 G PO PACK
17.0000 g | PACK | Freq: Every day | ORAL | Status: DC
  Filled 2013-10-13 (×2): qty 1

## 2013-10-13 NOTE — Progress Notes (Signed)
Patient is not very happy that she had to wait so long to be discharged. Texted MD Venetia Constable and also informed the charge nurse Sheppard Evens  . Tried to explain to patient that the MD would get discharged ready as soon as he can it takes about a couple of one to two hours max but she has been waiting since 0830 this am . Asked the Assist Director to talk with patient before discharge.or call if she has already left before she can .

## 2013-10-13 NOTE — Discharge Summary (Signed)
Physician Discharge Summary  [name] ATF:[id number] DOB: Apr 20, 1945 DOA: 10/06/2013  PCP: Maurice Small, D, MD  Admit date: 10/06/2013 Discharge date: 10/13/2013  Time spent: 35 minutes  Recommendations for Outpatient Follow-up:  1. Follow up with oncologist 2 weeks.  Discharge Diagnoses:  Principal Problem:   pneumonia in imunocompromised Active Problems:   Acute respiratory failure with hypoxia   HYPERTENSION   Status post lobectomy of lung   Pleurisy   Sepsis   Dehydration   Multiple myeloma   Thrombocytopenia   Leukopenia   Other pancytopenia   Acute pulmonary edema   Discharge Condition: Stable  Diet recommendation: Regular  Filed Weights   10/09/13 0413 10/10/13 0500 10/12/13 0519  Weight: 89.7 kg (197 lb 12 oz) 89.359 kg (197 lb) 90.81 kg (200 lb 3.2 oz)    History of present illness:  67 y.o. 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 Pt states that she woke up yesterday with L sided chest pain that was fairly constant. Also worse with coughing and deep breathing. Denies any fever or shortness of breath. States that she had some mild sore throat/URI sxs that resolved last week. Has been compliant with medication regimen including oral chemotherapy. Reports that blood sugars have been stable. Denies any orthopne , PND. No nausea or diaphoresis. Presented to PCP's office about complaints. Was noted to be tachycardic and ? Hypoxic   Hospital Course:  Sepsis  Due to pneumonia.  - resolved with antibiotics.  Acute respiratory failure with hypoxia/pneumonia in imunocompromiswed/Pleurisy: - started on admission on vanc and cefepime - de-escalate treatment to levaquin monitor overnight.  - remained afebrile and WBC improved, home 6 additional days of  antibiotics.  Acute pulmonary edema, from fluid overload:  - normal EF and no h/o CHF.  -Single dose of lasix with improvement.  Status post lobectomy of lung secondary to carcinoid  - stable   Multiple myeloma  - revlimid held. Left courtesy message with onc on Friday.  - resume as an outpatient. With 1/21 day cycle.  Dehydration/hypotension  Resolved.  Pancytopenia likely from MM  WBC and platelet count recovering.  Has not required transfusion.   HYPERTENSION with mild concentric hypertrophy  meds held. Resume as able   Pulmonary hypertension  -Noted on echo this admission with pressure of 51 mmHg.  - follow up with cardiology as an outpatient   Glucose intolerance  Hemeglobin A1c- 6.0  -cont to follow sugars   Procedures:  ECHO  Consultations:  None  Discharge Exam: Filed Vitals:   10/13/13 0518  BP: 134/54  Pulse: 68  Temp: 97.9 F (36.6 C)  Resp: 18    General: A&O x3 Cardiovascular: RRR  Respiratory: Good air movement CTA B/L  Discharge Instructions You were cared for by a hospitalist during your hospital stay. If you have any questions about your discharge medications or the care you received while you were in the hospital after you are discharged, you can call the unit and asked to speak with the hospitalist on call if the hospitalist that took care of you is not available. Once you are discharged, your primary care physician will handle any further medical issues. Please note that NO REFILLS for any discharge medications will be authorized once you are discharged, as it is imperative that  you return to your primary care physician (or establish a relationship with a primary care physician if you do not have one) for your aftercare needs so that they can reassess your need for medications and monitor your lab values.   Current Discharge Medication List    START taking these medications   Details  ibuprofen (ADVIL,MOTRIN) 600 MG tablet Take 1 tablet  (600 mg total) by mouth every 6 (six) hours as needed. Qty: 30 tablet, Refills: 0    levofloxacin (LEVAQUIN) 750 MG tablet Take 1 tablet (750 mg total) by mouth daily. Qty: 6 tablet, Refills: 0      CONTINUE these medications which have CHANGED   Details  esomeprazole (NEXIUM) 40 MG capsule Take 1 capsule (40 mg total) by mouth 2 (two) times daily. Qty: 30 capsule, Refills: 0   Associated Diagnoses: Other pancytopenia; Abnormal LFTs      CONTINUE these medications which have NOT CHANGED   Details  acyclovir (ZOVIRAX) 400 MG tablet Take 1 tablet (400 mg total) by mouth 2 (two) times daily. Qty: 60 tablet, Refills: 0   Associated Diagnoses: Multiple myeloma, without mention of having achieved remission    aspirin 325 MG tablet Take 325 mg by mouth daily.    buPROPion (WELLBUTRIN XL) 300 MG 24 hr tablet Take 300 mg by mouth daily.   Associated Diagnoses: Other pancytopenia; Abnormal LFTs    lenalidomide (REVLIMID) 10 MG capsule Take 1 capsule (10 mg total) by mouth daily. TAKE ONE CAPSULE BY MOUTH FOR 21 DAYS AND 7 DAYS OFF. Qty: 21 capsule, Refills: 0   Associated Diagnoses: Multiple myeloma, without mention of having achieved remission; Monoclonal paraproteinemia    losartan (COZAAR) 50 MG tablet Take 50 mg by mouth daily.   Associated Diagnoses: Other pancytopenia; Abnormal LFTs    magnesium chloride (SLOW-MAG) 64 MG TBEC SR tablet Take 1 tablet by mouth 2 (two) times daily.     metoprolol succinate (TOPROL-XL) 50 MG 24 hr tablet Take 50 mg by mouth daily. Take with or immediately following a meal.   Associated Diagnoses: Other pancytopenia; Abnormal LFTs    Multiple Vitamins-Minerals (MULTIVITAMIN WITH MINERALS) tablet Take 1 tablet by mouth at bedtime.    Associated Diagnoses: Other pancytopenia; Abnormal LFTs    Multiple Vitamins-Minerals (PRESERVISION AREDS 2) CAPS Take 1 capsule by mouth 2 (two) times daily.    OVER THE COUNTER MEDICATION Take 22.3-44.6 mg by mouth 2  (two) times daily. Nexium OTC 22.3 mg (take 2 tablets (44.6 mg) every morning and 1 tablet (22.3 mg) every night    sertraline (ZOLOFT) 100 MG tablet Take 100 mg by mouth daily.    Associated Diagnoses: Other pancytopenia; Abnormal LFTs    simvastatin (ZOCOR) 40 MG tablet Take 40 mg by mouth at bedtime.    Associated Diagnoses: Other pancytopenia; Abnormal LFTs       No Known Allergies Follow-up Information   Follow up with [name], MD. (Follow up for your chest wall pain)    Specialty:  Family Medicine   Contact information:   [place] [id number] [phone]        The results of significant diagnostics from this hospitalization (including imaging, microbiology, ancillary and laboratory) are listed below for reference.    Significant Diagnostic Studies: Ct Angio Chest Pe W/cm &/or Wo Cm  10/06/2013   CLINICAL DATA:  Left chest pain.  EXAM: CT ANGIOGRAPHY CHEST WITH CONTRAST  TECHNIQUE: Multidetector CT imaging of the chest was  performed using the standard protocol during bolus administration of intravenous contrast. Multiplanar CT image reconstructions and MIPs were obtained to evaluate the vascular anatomy.  CONTRAST:  177m OMNIPAQUE IOHEXOL 350 MG/ML SOLN  COMPARISON:  Chest CT 06/14/2012  FINDINGS: No evidence for a pulmonary embolism. Postoperative changes compatible with a right lower lobectomy. There has been interval enlargement of a right mediastinal lymph node which measures 1.2 cm in the short axis on sequence 401, image 16. Small lymph nodes in the supraclavicular regions. There is also interval enlargement of a prevascular lymph node that measures 1.3 cm on image 27. No significant pericardial or pleural fluid. No acute abnormalities within the upper abdomen.  There are patchy airspace densities in the right upper lobe and findings are suggestive for an infectious or inflammatory process. Patchy parenchymal densities along the periphery  of the left lower lobe. There is extensive airspace disease with consolidation in the lingula. No acute bone abnormality.  Review of the MIP images confirms the above findings.  IMPRESSION: Negative for a pulmonary embolism.  Patchy airspace disease in both lungs. There is confluent airspace disease in the lingula and the findings are most compatible with pneumonia. Recommend follow-up to ensure resolution.  Postsurgical changes consistent with a right lower lobectomy and resection of the right lower lobe lesion. There has been interval enlargement of mediastinal lymph nodes. These enlarged lymph nodes are nonspecific and could be reactive in etiology. Recommend attention to these areas on follow up imaging.   Electronically Signed   By: AMarkus DaftM.D.   On: 10/06/2013 17:34   Dg Chest Port 1 View  10/11/2013   CLINICAL DATA:  Pulmonary edema.  Short of breath.  EXAM: PORTABLE CHEST - 1 VIEW  COMPARISON:  10/08/2013.  FINDINGS: Support apparatus: Monitoring leads project over the chest.  Cardiomediastinal Silhouette:  Unchanged.  Lungs: Allowing for differences in technique, there is no interval change with bilateral interstitial and airspace opacity most compatible with pulmonary edema. No pneumothorax.  Effusions:  Possible small RIGHT pleural effusion.  Other:  None.  IMPRESSION: No interval change allowing for differences in technique from 10/08/2013. RIGHT-greater-than-LEFT interstitial and mild airspace opacity most consistent with pulmonary edema.   Electronically Signed   By: GDereck LigasM.D.   On: 10/11/2013 08:13   Dg Chest Port 1 View  10/08/2013   CLINICAL DATA:  68year old female with pneumonia and shortness of breath  EXAM: PORTABLE CHEST - 1 VIEW  COMPARISON:  10/07/2013  FINDINGS: Cardiomediastinal silhouette is stable.  Increasing bilateral airspace opacities likely represent moderate pulmonary edema.  There is no evidence of pneumothorax.  Right apical thickening is again noted.  This  is a low volume film.  IMPRESSION: Increasing airspace opacities likely representing mild pulmonary edema.   Electronically Signed   By: JHassan RowanM.D.   On: 10/08/2013 10:00   Dg Chest Port 1 View  10/07/2013   CLINICAL DATA:  Hypoxia  EXAM: PORTABLE CHEST - 1 VIEW  COMPARISON:  Chest radiograph July 08, 2012 and chest CT October 06, 2013  FINDINGS: The previously noted lingular consolidation is again noted in appears grossly stable. Patchy infiltrate in the right upper lobe and right base is also present and appears unchanged. No new opacity. Heart is upper normal in size with pulmonary vascularity within normal limits. No adenopathy. No bone lesions.  IMPRESSION: Areas of infiltrate bilaterally, most pronounced in the lingula, essentially stable compared to 1 day prior.   Electronically Signed  By: Lowella Grip M.D.   On: 10/07/2013 09:18    Microbiology: Recent Results (from the past 240 hour(s))  CULTURE, BLOOD (ROUTINE X 2)     Status: None   Collection Time    10/06/13  8:34 PM      Result Value Ref Range Status   Specimen Description BLOOD FOREARM RIGHT   Final   Special Requests BOTTLES DRAWN AEROBIC ONLY 5CC   Final   Culture  Setup Time     Final   Value: 10/07/2013 01:03     Performed at [facility]   Culture     Final   Value: NO GROWTH 5 DAYS     Performed at [facility]   Report Status 10/13/2013 FINAL   Final  CULTURE, BLOOD (ROUTINE X 2)     Status: None   Collection Time    10/06/13  8:41 PM      Result Value Ref Range Status   Specimen Description BLOOD HAND RIGHT   Final   Special Requests BOTTLES DRAWN AEROBIC AND ANAEROBIC 10CC   Final   Culture  Setup Time     Final   Value: 10/07/2013 01:02     Performed at [facility]   Culture     Final   Value: NO GROWTH 5 DAYS     Note: Culture results may be compromised due to an excessive volume of blood received in culture bottles.     Performed at [facility]   Report Status  10/13/2013 FINAL   Final  MRSA PCR SCREENING     Status: None   Collection Time    10/06/13 10:20 PM      Result Value Ref Range Status   MRSA by PCR NEGATIVE  NEGATIVE Final   Comment:            The GeneXpert MRSA Assay (FDA     approved for NASAL specimens     only), is one component of a     comprehensive MRSA colonization     surveillance program. It is not     intended to diagnose MRSA     infection nor to guide or     monitor treatment for     MRSA infections.  URINE CULTURE     Status: None   Collection Time    10/07/13 12:50 PM      Result Value Ref Range Status   Specimen Description URINE, CATHETERIZED   Final   Special Requests NONE   Final   Culture  Setup Time     Final   Value: 10/08/2013 02:14     Performed at [facility]     Final   Value: NO GROWTH     Performed at [facility]   Culture     Final   Value: NO GROWTH     Performed at [facility]   Report Status 10/09/2013 FINAL   Final     Labs: Basic Metabolic Panel:  Recent Labs Lab 10/06/13 2041 10/07/13 0100 10/07/13 0930 10/08/13 0927 10/10/13 0726  NA  --  138 135* 134* 143  K  --  4.3 4.3 3.8 3.7  CL  --  102 100 103 101  CO2  --  '21 20 19 29  ' GLUCOSE  --  104* 95 135* 98  BUN  --  '17 21 15 11  ' CREATININE 0.77 0.98 1.00 0.64 0.74  CALCIUM  --  8.4 8.4 8.3* 9.1   Liver Function Tests:  Recent Labs Lab 10/07/13 0100 10/08/13 0927  AST 23 36  ALT 37* 46*  ALKPHOS 131* 178*  BILITOT 0.5 0.4  PROT 6.3 5.9*  ALBUMIN 2.5* 2.2*   No results found for this basename: LIPASE, AMYLASE,  in the last 168 hours No results found for this basename: AMMONIA,  in the last 168 hours CBC:  Recent Labs Lab 10/07/13 0930 10/08/13 0927 10/09/13 0656 10/10/13 0726 10/11/13 0550  WBC 1.8* 1.6* 2.4* 2.8* 2.3*  NEUTROABS 1.3* 1.1* 1.3* 1.5* 1.1*  HGB 8.5* 8.3* 7.8* 8.3* 8.7*  HCT 26.1* 25.5* 24.1* 24.7* 26.6*  MCV 95.6 94.1 92.3 95.0 93.7  PLT 122*  126* 139* 185 195   Cardiac Enzymes:  Recent Labs Lab 10/06/13 2040 10/07/13 0100 10/07/13 0700  TROPONINI <0.30 <0.30 <0.30   BNP: BNP (last 3 results)  Recent Labs  10/06/13 1409 10/08/13 0927  PROBNP 597.9* 1718.0*   CBG:  Recent Labs Lab 10/07/13 0510 10/07/13 0739 10/07/13 1156 10/07/13 1625 10/11/13 1949  GLUCAP 87 106* 106* 102* 177*       Signed:  Charlynne Cousins  [facility] 10/13/2013, 3:37 PM

## 2013-10-18 DIAGNOSIS — Z23 Encounter for immunization: Secondary | ICD-10-CM | POA: Diagnosis not present

## 2013-10-18 DIAGNOSIS — J189 Pneumonia, unspecified organism: Secondary | ICD-10-CM | POA: Diagnosis not present

## 2013-10-18 DIAGNOSIS — C9000 Multiple myeloma not having achieved remission: Secondary | ICD-10-CM | POA: Diagnosis not present

## 2013-10-18 DIAGNOSIS — E1129 Type 2 diabetes mellitus with other diabetic kidney complication: Secondary | ICD-10-CM | POA: Diagnosis not present

## 2013-10-18 DIAGNOSIS — C3490 Malignant neoplasm of unspecified part of unspecified bronchus or lung: Secondary | ICD-10-CM | POA: Diagnosis not present

## 2013-10-18 DIAGNOSIS — Z09 Encounter for follow-up examination after completed treatment for conditions other than malignant neoplasm: Secondary | ICD-10-CM | POA: Diagnosis not present

## 2013-10-23 ENCOUNTER — Emergency Department (INDEPENDENT_AMBULATORY_CARE_PROVIDER_SITE_OTHER)
Admission: EM | Admit: 2013-10-23 | Discharge: 2013-10-23 | Disposition: A | Discharge status: Home / Self Care | Payer: Medicare Other | Source: Home / Self Care | Attending: Family Medicine | Admitting: Family Medicine

## 2013-10-23 ENCOUNTER — Encounter (HOSPITAL_COMMUNITY): Payer: Self-pay | Admitting: Emergency Medicine

## 2013-10-23 DIAGNOSIS — J029 Acute pharyngitis, unspecified: Secondary | ICD-10-CM

## 2013-10-23 HISTORY — DX: Pneumonia, unspecified organism: J18.9

## 2013-10-23 HISTORY — DX: Sepsis, unspecified organism: A41.9

## 2013-10-23 NOTE — Discharge Instructions (Signed)
Drink plenty of fluids as discussed, use lozenges, gargle and mucinex or delsym for cough. Return or see your doctor if further problems

## 2013-10-23 NOTE — ED Provider Notes (Signed)
CSN: [id number]     Arrival date & time 10/23/13  1155 History   First MD Initiated Contact with Patient 10/23/13 1205     Chief Complaint  Patient presents with  . Sore Throat   (Consider location/radiation/quality/duration/timing/severity/associated sxs/prior Treatment) Patient is a 68 y.o. female presenting with pharyngitis. The history is provided by the patient.  Sore Throat This is a recurrent problem. The current episode started 2 days ago. The problem has not changed since onset.Pertinent negatives include no chest pain, no abdominal pain and no shortness of breath. Associated symptoms comments: Having pnd, no fever, taking levaquin for 2 weeks , still on med. No other sx..    Past Medical History  Diagnosis Date  . Osteoporosis   . Hypercholesterolemia   . Esophageal reflux disease   . Hypertension   . Diabetes type 2, controlled   . Pancytopenia   . Ischemic colitis 2009  . Lung nodule 06/11/2012  . Depression   . Sleep apnea     UPPER AIRWAY RESISTANCE  DOES NOT HAVE CPAP , MAY NEED   . Tuberculosis      (+ TB TEST)YEARS AGO POSSIBLE EXPOSURE WAS MED TX   . Multiple myeloma 05/2012    Cytogenetics on 06/08/2012 was normal 26, XX [20]  . Liver enzyme elevation   . Sepsis   . Pneumonia    Past Surgical History  Procedure Laterality Date  . Colonoscopy  2013    poly; Dr. Oletta Lamas, next one due 2018.l  . Appendectomy    . Tonsillectomy    . Leg surgery    . Video bronchoscopy with endobronchial navigation Right 07/08/2012    Procedure: VIDEO BRONCHOSCOPY WITH ENDOBRONCHIAL NAVIGATION;  Surgeon: Collene Gobble, MD;  Location: [facility];  Service: Thoracic;  Laterality: Right;   Family History  Problem Relation Age of Onset  . Heart attack Father   . Cancer Sister     synovial sarcoma  . Cancer Maternal Grandfather     ? cancer   History  Substance Use Topics  . Smoking status: Former Smoker -- 1.00 packs/day for 30 years    Types: Cigarettes    Quit date:  01/27/2009  . Smokeless tobacco: Never Used  . Alcohol Use: 0.0 oz/week     Comment: occasionally   OB History   Grav Para Term Preterm Abortions TAB SAB Ect Mult Living                 Review of Systems  Constitutional: Negative.  Negative for fever.  HENT: Positive for congestion, postnasal drip, rhinorrhea and sore throat.   Respiratory: Negative for cough and shortness of breath.   Cardiovascular: Negative for chest pain.  Gastrointestinal: Negative for abdominal pain.    Allergies  Review of patient's allergies indicates no known allergies.  Home Medications   Prior to Admission medications   Medication Sig Start Date End Date Taking? Authorizing Provider  acyclovir (ZOVIRAX) 400 MG tablet Take 1 tablet (400 mg total) by mouth 2 (two) times daily. 01/18/13  Yes Concha Norway, MD  aspirin 325 MG tablet Take 325 mg by mouth daily.   Yes Historical Provider, MD  buPROPion (WELLBUTRIN XL) 300 MG 24 hr tablet Take 300 mg by mouth daily.   Yes Historical Provider, MD  esomeprazole (NEXIUM) 40 MG capsule Take 1 capsule (40 mg total) by mouth 2 (two) times daily. 10/13/13  Yes Charlynne Cousins, MD  ibuprofen (ADVIL,MOTRIN) 600 MG tablet Take 1 tablet (600  mg total) by mouth every 6 (six) hours as needed. 10/06/13  Yes Kelby Aline, MD  lenalidomide (REVLIMID) 10 MG capsule Take 1 capsule (10 mg total) by mouth daily. TAKE ONE CAPSULE BY MOUTH FOR 21 DAYS AND 7 DAYS OFF. 09/09/13  Yes Concha Norway, MD  levofloxacin (LEVAQUIN) 750 MG tablet Take 750 mg by mouth daily. Started 10/19/13   Yes Historical Provider, MD  losartan (COZAAR) 50 MG tablet Take 50 mg by mouth daily.   Yes Historical Provider, MD  magnesium chloride (SLOW-MAG) 64 MG TBEC SR tablet Take 1 tablet by mouth 2 (two) times daily.    Yes Historical Provider, MD  metoprolol succinate (TOPROL-XL) 50 MG 24 hr tablet Take 50 mg by mouth daily. Take with or immediately following a meal.   Yes Historical Provider, MD  Multiple  Vitamins-Minerals (MULTIVITAMIN WITH MINERALS) tablet Take 1 tablet by mouth at bedtime.    Yes Historical Provider, MD  Multiple Vitamins-Minerals (PRESERVISION AREDS 2) CAPS Take 1 capsule by mouth 2 (two) times daily.   Yes Historical Provider, MD  OVER THE COUNTER MEDICATION Take 22.3-44.6 mg by mouth 2 (two) times daily. Nexium OTC 22.3 mg (take 2 tablets (44.6 mg) every morning and 1 tablet (22.3 mg) every night   Yes Historical Provider, MD  sertraline (ZOLOFT) 100 MG tablet Take 100 mg by mouth daily.    Yes Historical Provider, MD  simvastatin (ZOCOR) 40 MG tablet Take 40 mg by mouth at bedtime.    Yes Historical Provider, MD  levofloxacin (LEVAQUIN) 750 MG tablet Take 1 tablet (750 mg total) by mouth daily. 10/13/13   Charlynne Cousins, MD   BP 140/66  Pulse 78  Temp(Src) 97.3 F (36.3 C) (Oral)  Resp 18  SpO2 94% Physical Exam  Nursing note and vitals reviewed. Constitutional: She is oriented to person, place, and time. She appears well-developed and well-nourished. No distress.  HENT:  Head: Normocephalic.  Right Ear: External ear normal.  Left Ear: External ear normal.  Mouth/Throat: Oropharynx is clear and moist.  Eyes: Pupils are equal, round, and reactive to light.  Neck: Normal range of motion. Neck supple.  Cardiovascular: Normal heart sounds and intact distal pulses.   Pulmonary/Chest: Effort normal and breath sounds normal.  Lymphadenopathy:    She has no cervical adenopathy.  Neurological: She is alert and oriented to person, place, and time.  Skin: Skin is warm and dry.    ED Course  Procedures (including critical care time) Labs Review Labs Reviewed - No data to display  Imaging Review No results found.   MDM   1. Acute pharyngitis, unspecified pharyngitis type        Billy Fischer, MD 10/23/13 1235

## 2013-10-23 NOTE — ED Notes (Signed)
Pt c/o sore throat when swallowing since Friday.  States "petrified" she is getting sick again, as she only had a sore throat when she presented to her PCP 9/10 & was sent to ED, admitted with sepsis & pneumonia.  Denies any fevers.  Had f/u with PCP on 9/23 - was finishing Levaquin course from hospital discharge, told PCP she felt like she was getting sick again, was given another course of Levaquin, which she is currently taking.  Denies any other c/o's.

## 2013-11-03 ENCOUNTER — Telehealth: Payer: Self-pay | Admitting: *Deleted

## 2013-11-03 NOTE — Telephone Encounter (Signed)
Received call from patient stating,"My doctor at [facility] wants me to stop taking Revlimid until I get better after my stem cell transplant. I see him October 26 th and I will call [facility] and let everyone know when I can start taking it again."

## 2013-11-10 ENCOUNTER — Other Ambulatory Visit: Payer: Self-pay | Admitting: Family Medicine

## 2013-11-10 ENCOUNTER — Ambulatory Visit
Admission: RE | Admit: 2013-11-10 | Discharge: 2013-11-10 | Disposition: A | Discharge status: Home / Self Care | Payer: Medicare Other | Source: Ambulatory Visit | Attending: Family Medicine | Admitting: Family Medicine

## 2013-11-10 DIAGNOSIS — R0602 Shortness of breath: Secondary | ICD-10-CM | POA: Diagnosis not present

## 2013-11-10 DIAGNOSIS — R079 Chest pain, unspecified: Secondary | ICD-10-CM | POA: Diagnosis not present

## 2013-11-10 DIAGNOSIS — R109 Unspecified abdominal pain: Secondary | ICD-10-CM

## 2013-11-10 DIAGNOSIS — J029 Acute pharyngitis, unspecified: Secondary | ICD-10-CM | POA: Diagnosis not present

## 2013-11-10 DIAGNOSIS — J90 Pleural effusion, not elsewhere classified: Secondary | ICD-10-CM | POA: Diagnosis not present

## 2013-11-10 DIAGNOSIS — Z8701 Personal history of pneumonia (recurrent): Secondary | ICD-10-CM | POA: Diagnosis not present

## 2013-11-10 DIAGNOSIS — J189 Pneumonia, unspecified organism: Secondary | ICD-10-CM | POA: Diagnosis not present

## 2013-11-11 DIAGNOSIS — C9000 Multiple myeloma not having achieved remission: Secondary | ICD-10-CM | POA: Diagnosis not present

## 2013-11-11 DIAGNOSIS — Z23 Encounter for immunization: Secondary | ICD-10-CM | POA: Diagnosis not present

## 2013-11-11 DIAGNOSIS — Z9484 Stem cells transplant status: Secondary | ICD-10-CM | POA: Diagnosis not present

## 2013-11-18 ENCOUNTER — Telehealth: Payer: Self-pay | Admitting: *Deleted

## 2013-11-18 DIAGNOSIS — R911 Solitary pulmonary nodule: Secondary | ICD-10-CM | POA: Diagnosis not present

## 2013-11-18 DIAGNOSIS — Z902 Acquired absence of lung [part of]: Secondary | ICD-10-CM | POA: Diagnosis not present

## 2013-11-18 DIAGNOSIS — C3411 Malignant neoplasm of upper lobe, right bronchus or lung: Secondary | ICD-10-CM | POA: Diagnosis not present

## 2013-11-18 NOTE — Telephone Encounter (Signed)
Received voice message from patient stating,"my oncologist at [facility], Dr. Harvel Ricks, switched me from Revlimid to [place]. Do you or Dr. Lona Kettle have those orders? When is my appointment? My last dose of Revlimid was 10/06/13. My return number is [phone]. This message will be given to MD.

## 2013-11-22 ENCOUNTER — Telehealth: Payer: Self-pay | Admitting: Hematology

## 2013-11-22 ENCOUNTER — Other Ambulatory Visit: Payer: Self-pay | Admitting: Pharmacist

## 2013-11-22 ENCOUNTER — Other Ambulatory Visit: Payer: Self-pay | Admitting: Hematology

## 2013-11-22 NOTE — Telephone Encounter (Signed)
lvm for pt regarding to [facility] appts.Marland KitchenMarland Kitchen

## 2013-11-24 ENCOUNTER — Other Ambulatory Visit: Payer: Self-pay | Admitting: Hematology

## 2013-11-24 ENCOUNTER — Telehealth: Payer: Self-pay

## 2013-11-24 DIAGNOSIS — C9000 Multiple myeloma not having achieved remission: Secondary | ICD-10-CM

## 2013-11-24 MED ORDER — ONDANSETRON HCL 8 MG PO TABS: 8.0000 mg | ORAL_TABLET | Freq: Two times a day (BID) | ORAL | Status: DC

## 2013-11-24 MED ORDER — ACYCLOVIR 400 MG PO TABS: 400.0000 mg | ORAL_TABLET | Freq: Two times a day (BID) | ORAL | Status: DC

## 2013-11-24 MED ORDER — DEXAMETHASONE 4 MG PO TABS
8.0000 mg | ORAL_TABLET | Freq: Two times a day (BID) | ORAL | Status: DC
Start: 2013-11-24 — End: 2013-11-30

## 2013-11-24 MED ORDER — PROCHLORPERAZINE MALEATE 10 MG PO TABS: 10.0000 mg | ORAL_TABLET | Freq: Four times a day (QID) | ORAL | Status: DC | PRN

## 2013-11-24 MED ORDER — LORAZEPAM 0.5 MG PO TABS: 0.5000 mg | ORAL_TABLET | Freq: Four times a day (QID) | ORAL | Status: DC | PRN

## 2013-11-24 NOTE — Telephone Encounter (Signed)
S/w pt that she will be getting velcade at [facility]. Dr Lona Kettle ordered nausea med RXs to be picked up. Pt states she "has a wheelbarrow full" of meds. She sees Dr Lona Kettle on next visit. She will bring in meds and organize needs at that time.

## 2013-11-30 ENCOUNTER — Ambulatory Visit (HOSPITAL_BASED_OUTPATIENT_CLINIC_OR_DEPARTMENT_OTHER): Payer: Medicare Other

## 2013-11-30 ENCOUNTER — Telehealth: Payer: Self-pay | Admitting: Hematology

## 2013-11-30 ENCOUNTER — Ambulatory Visit (HOSPITAL_BASED_OUTPATIENT_CLINIC_OR_DEPARTMENT_OTHER): Payer: Medicare Other | Admitting: Hematology

## 2013-11-30 ENCOUNTER — Encounter: Payer: Self-pay | Admitting: Hematology

## 2013-11-30 ENCOUNTER — Other Ambulatory Visit (HOSPITAL_BASED_OUTPATIENT_CLINIC_OR_DEPARTMENT_OTHER): Payer: Medicare Other

## 2013-11-30 VITALS — BP 151/66 | HR 78 | Temp 98.0°F | Resp 18 | Ht 65.0 in | Wt 199.6 lb

## 2013-11-30 DIAGNOSIS — D72819 Decreased white blood cell count, unspecified: Secondary | ICD-10-CM

## 2013-11-30 DIAGNOSIS — Z5112 Encounter for antineoplastic immunotherapy: Secondary | ICD-10-CM | POA: Diagnosis not present

## 2013-11-30 DIAGNOSIS — C7A090 Malignant carcinoid tumor of the bronchus and lung: Secondary | ICD-10-CM

## 2013-11-30 DIAGNOSIS — D630 Anemia in neoplastic disease: Secondary | ICD-10-CM

## 2013-11-30 DIAGNOSIS — C9001 Multiple myeloma in remission: Secondary | ICD-10-CM

## 2013-11-30 DIAGNOSIS — Z859 Personal history of malignant neoplasm, unspecified: Secondary | ICD-10-CM

## 2013-11-30 DIAGNOSIS — C9000 Multiple myeloma not having achieved remission: Secondary | ICD-10-CM

## 2013-11-30 LAB — CBC WITH DIFFERENTIAL/PLATELET
BASO%: 0.4 % (ref 0.0–2.0)
BASOS ABS: 0 10*3/uL (ref 0.0–0.1)
EOS ABS: 0.3 10*3/uL (ref 0.0–0.5)
EOS%: 5.5 % (ref 0.0–7.0)
HCT: 34.3 % — ABNORMAL LOW (ref 34.8–46.6)
HEMOGLOBIN: 11 g/dL — AB (ref 11.6–15.9)
LYMPH%: 17.6 % (ref 14.0–49.7)
MCH: 31.4 pg (ref 25.1–34.0)
MCHC: 32 g/dL (ref 31.5–36.0)
MCV: 98 fL (ref 79.5–101.0)
MONO#: 0.3 10*3/uL (ref 0.1–0.9)
MONO%: 7.5 % (ref 0.0–14.0)
NEUT%: 69 % (ref 38.4–76.8)
NEUTROS ABS: 3.2 10*3/uL (ref 1.5–6.5)
PLATELETS: 170 10*3/uL (ref 145–400)
RBC: 3.5 10*6/uL — ABNORMAL LOW (ref 3.70–5.45)
RDW: 17.5 % — AB (ref 11.2–14.5)
WBC: 4.6 10*3/uL (ref 3.9–10.3)
lymph#: 0.8 10*3/uL — ABNORMAL LOW (ref 0.9–3.3)

## 2013-11-30 LAB — COMPREHENSIVE METABOLIC PANEL (CC13)
ALBUMIN: 3.7 g/dL (ref 3.5–5.0)
ALT: 47 U/L (ref 0–55)
AST: 33 U/L (ref 5–34)
Alkaline Phosphatase: 104 U/L (ref 40–150)
Anion Gap: 9 mEq/L (ref 3–11)
BUN: 18.1 mg/dL (ref 7.0–26.0)
CALCIUM: 9.4 mg/dL (ref 8.4–10.4)
CO2: 25 mEq/L (ref 22–29)
Chloride: 107 mEq/L (ref 98–109)
Creatinine: 0.8 mg/dL (ref 0.6–1.1)
GLUCOSE: 154 mg/dL — AB (ref 70–140)
Potassium: 4.1 mEq/L (ref 3.5–5.1)
SODIUM: 140 meq/L (ref 136–145)
TOTAL PROTEIN: 6.5 g/dL (ref 6.4–8.3)
Total Bilirubin: 0.41 mg/dL (ref 0.20–1.20)

## 2013-11-30 LAB — LACTATE DEHYDROGENASE (CC13): LDH: 144 U/L (ref 125–245)

## 2013-11-30 MED ORDER — ONDANSETRON HCL 8 MG PO TABS
8.0000 mg | ORAL_TABLET | Freq: Once | ORAL | Status: AC
  Administered 2013-11-30: 8 mg via ORAL

## 2013-11-30 MED ORDER — ONDANSETRON HCL 8 MG PO TABS
ORAL_TABLET | ORAL | Status: AC
  Filled 2013-11-30: qty 1

## 2013-11-30 MED ORDER — BORTEZOMIB CHEMO SQ INJECTION 3.5 MG (2.5MG/ML)
1.3000 mg/m2 | Freq: Once | INTRAMUSCULAR | Status: AC
  Administered 2013-11-30: 2.75 mg via SUBCUTANEOUS
  Filled 2013-11-30: qty 2.75

## 2013-11-30 MED ORDER — ONDANSETRON 8 MG/50ML IVPB (CHCC): 8.0000 mg | Freq: Once | INTRAVENOUS | Status: DC

## 2013-11-30 MED ORDER — BORTEZOMIB CHEMO IV INJECTION 3.5 MG: 1.3000 mg/m2 | Freq: Once | INTRAMUSCULAR | Status: DC

## 2013-11-30 NOTE — Progress Notes (Signed)
[facility] OFFICE PROGRESS NOTE Date of Visit: 11/30/2013  Jonathon Bellows, MD [facility] [address] 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  REASON FOR OFFICE VISIT: Follow up.  CURRENT TREATMENT: Revlimid 10 mg daily for 21 days on and then 7 days off started on 07/22/2013. Today we are switching her Maintenance therapy to sq Velcade for 2 years upon recommendations from [facility].    Multiple myeloma, without mention of having achieved remission(203.00)   05/27/2012 Initial Diagnosis Multiple myeloma, without mention of having achieved remission(203.00)   05/27/2012 Cancer Staging 1.  IgG kappa multiple myeloma. She presented in 05/2012 with anemia, Cr 0.8, Ca 9.6; no bone lytic lesion; LDH 140; beta2- microglobulin 1.86 (ref 1.01-1.73). Positive SPEP for M-spike at 1.69, IgG 2080, kappa 7.24, kappa:lambda ratio 24.97; UPEP positive   06/08/2012 Bone Marrow Biopsy Bone marrow biopsy on 06/08/12 showed 19% plasma cell; normal cytogenetics; myeloma FISH pending.    06/14/2012 Imaging Skeletal survey negative for bone lytic lesions.  Lung nodule apparent.   09/17/2012 Bone Marrow Biopsy Bone marrow biopsy showed 11% plasma cells.   10/18/2012 - 12/31/2012 Chemotherapy Started on VRD (Velcade 1.3 mg/m^2 [place] days 1,8 and 15; Revlimide 25 mg PO daily, on day 1 through 14; Decadron 40 mg PO Days 1, 8, 15).   Started on coumadin given RD.   01/14/2013 Bone Marrow Biopsy The bone marrow clot sections are normocellular toslightly hypercellular for age (approximately 40% overall). 1% plasma cells.   02/08/2013 Bone Marrow Transplant Auto Transplant at [facility] (Dr. Glorianne Manchester McIver).   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 Lamonte Sakai)   07/08/2012  Pathology Results Pathology consistent with Non-small cell lung cancer of RLL.    07/14/2012 Imaging 1. Right lower lobe nodule is non hypermetabolic which suggests benign etiology considering the slow growing rate in 6 years or non FDG avid disease as seen with carcinoid tumors. Pending biopsy. No PET evidence of mediastinal nodal disease.    08/02/2012 Imaging MRI of brain negative.   08/12/2012 Surgery Robotic Lobectomy of R Lower lobe.    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     Surgical resection margins are negative.     Two benign lymph nodes.     pTa1N0   INTERVAL HISTORY: [name] 68 y.o. female returns for her follow-up. She was last seen by me on 11/01/2013.  She is status post SCT on 02/08/2013 as noted above.  She is doing fine.  She saw Dr. Harvel Ricks 2 weeks ago in October 2015.  He said everything appeared fine and to follow up  to start her immunizations.  In 1 month, surgery plans to  repeat CT of chest with Dr. Bea Laura.  Her energy is slowly improving.   She is independent of all activities of daily living. Her third cycle of Revlimid started on 09/17/2013.  She is taking revlimid 10 mg daily for 21 days and then off for 7 days.  She is now off Bactrim but continues to take Acyclovir. She also used to take metformin for 3 years but stopped that at time of transplant. Looking at her blood sugars here she  is running high and I asked her to resume her metformin 500 mg daily and also bring this to notice of her PCP and get hemoglobin A1C checked. Today her fasting sugar when she checked was 116. Blood glucose here was 181.   Patient was hospitalized with pneumonia and treated with IV antibiotics for 7 days. She is admitted on 10/06/2013 and discharged on 10/13/2013.she had a major left-sided pneumonia. She also had echocardiogram done which showed normal ejection fraction. After discharge she was seen by her  family doctor and was given 10 additional days of antibiotics so she took a total of 16 days of oral antibiotics after discharge from the hospital. It was discussed at [facility] that the Revlimid may be causing her more immunocompromised and it would be a good strategy to switch her to subcutaneous Velcade therapy every 2 weeks for 2 years. Today, she is going to get her first dose of Velcade subcutaneous. She hasn't received her flu shot and I will plan to give it to her on her next visit in about 2 weeks. Her last day of Revlimid was 10/06/2013.   Patient denies anorexia, headache, visual changes, confusion, drenching night sweats, palpable lymph node swelling, mucositis, odynophagia, dysphagia, jaundice, chest pain, palpitation, occasional shortness of breath, dyspnea on exertion, productive cough, gum bleeding, epistaxis, hematemesis, hemoptysis, abdominal pain, abdominal swelling, early satiety, melena, hematochezia, hematuria, skin rash, spontaneous bleeding, joint swelling, joint pain, heat or cold intolerance, bowel bladder incontinence, back pain, focal motor weakness, paresthesia.   MEDICAL HISTORY: Past Medical History  Diagnosis Date  . Osteoporosis   . Hypercholesterolemia   . Esophageal reflux disease   . Hypertension   . Diabetes type 2, controlled   . Pancytopenia   . Ischemic colitis 2009  . Lung nodule 06/11/2012  . Depression   . Sleep apnea     UPPER AIRWAY RESISTANCE  DOES NOT HAVE CPAP , MAY NEED   . Tuberculosis      (+ TB TEST)YEARS AGO POSSIBLE EXPOSURE WAS MED TX   . Multiple myeloma 05/2012    Cytogenetics on 06/08/2012 was normal 53, XX [20]  . Liver enzyme elevation   . Sepsis   . 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    ALLERGIES:  has No Known Allergies.  MEDICATIONS: has a current medication list which includes the following prescription(s): acyclovir, acyclovir, aspirin, bupropion, dexamethasone, esomeprazole, ibuprofen, lenalidomide, levofloxacin, levofloxacin, lorazepam, losartan, magnesium chloride, metoprolol succinate, multivitamin with minerals, preservision areds 2, ondansetron, OVER THE COUNTER MEDICATION, prochlorperazine, sertraline, and simvastatin.  SURGICAL HISTORY:  Past Surgical History  Procedure Laterality Date  . Colonoscopy  2013    poly; Dr. Oletta Lamas, next one due 2018.l  . Appendectomy    . Tonsillectomy    . Leg surgery    . Video bronchoscopy with endobronchial navigation Right 07/08/2012    Procedure: VIDEO BRONCHOSCOPY WITH ENDOBRONCHIAL NAVIGATION;  Surgeon: Collene Gobble, MD;  Location: [facility];  Service: Thoracic;  Laterality: Right;    REVIEW OF SYSTEMS:  Constitutional: Denies fevers, chills or abnormal weight loss, blood sugars running high. Eyes: Denies blurriness of vision Ears, nose, mouth, throat, and face: Denies mucositis or sore throat Respiratory: Denies cough, dyspnea or wheezes Cardiovascular: Denies palpitation, chest discomfort or lower extremity swelling Gastrointestinal:  Denies nausea, heartburn or change in bowel habits Skin: Denies abnormal skin rashes Lymphatics: Denies new lymphadenopathy or easy bruising Neurological:Denies numbness, tingling or new weaknesses Behavioral/Psych: Mood is stable, no new changes  All other systems were reviewed with the patient and are  negative.  PHYSICAL EXAMINATION: ECOG PERFORMANCE STATUS: 1  Blood pressure 151/66, pulse 78, temperature 98 F (36.7 C), temperature source Oral, resp. rate 18, height '5\' 5"'  (1.651 m), weight 199 lb 9.6 oz (90.538 kg).  GENERAL:alert, no distress and comfortable; well developed and well nourished.  Hair growing back.  SKIN: skin color, texture, turgor are normal, no rashes or significant lesions EYES: normal, Conjunctiva are pink and non-injected, sclera clear OROPHARYNX:no exudate, no erythema and lips, buccal mucosa, and tongue normal  NECK: supple, thyroid normal size, non-tender, without nodularity LYMPH:  no palpable lymphadenopathy in the cervical, axillary or supraclavicular LUNGS: clear to auscultation with normal breathing effort, no wheezes or rhonchi HEART: regular rate & rhythm and no murmurs and no lower extremity edema ABDOMEN:abdomen soft, non-tender and normal bowel sounds Musculoskeletal:no cyanosis of digits and no clubbing  NEURO: alert & oriented x 3 with fluent speech, no focal motor/sensory deficits  Labs:          RADIOGRAPHIC STUDIES:    CLINICAL DATA: Left-sided chest pain for the last few days. Occasional shortness of breath. Recent hospitalization for pneumonia.  EXAM: CHEST 2 VIEW 11/10/2013  COMPARISON: Chest x-ray 10/11/2013 and chest CT 10/06/2013  FINDINGS: The cardiac silhouette, mediastinal and hilar contours are within normal limits and stable. There is mild tortuosity of the thoracic aorta. There is a small to moderate-sized right pleural effusion.The left lower lobe pneumonia has largely cleared. There may be some residual post pneumonic atelectasis or scarring but no airspace consolidation. No left-sided pleural effusion. The bony thorax is intact.  IMPRESSION:Small to moderate-sized right pleural effusion increased since prior CT scan.Clearing of left lung infiltrate with probable post pneumonic atelectasis and  scarring.    Electronically Signed  By: Kalman Jewels M.D. On: 11/10/2013 16:48  EXAM: CT ANGIOGRAPHY CHEST WITH CONTRAST 10/06/2013 TECHNIQUE: Multidetector CT imaging of the chest was performed using the standard protocol during bolus administration of intravenous contrast. Multiplanar CT image reconstructions and MIPs were obtained to evaluate the vascular anatomy. CONTRAST: 118m OMNIPAQUE IOHEXOL 350 MG/ML SOLN COMPARISON: Chest CT 06/14/2012  FINDINGS: No evidence for a pulmonary embolism. Postoperative changes compatible with a right lower lobectomy. There has been interval enlargement of a right mediastinal lymph node which measures 1.2 cm in the short axis on sequence 401, image 16. Small lymph nodes in the supraclavicular regions. There is also interval enlargement of a prevascular lymph node that measures 1.3 cm on image 27. No significant pericardial or pleural fluid. No acute abnormalities within the upper abdomen. There are patchy airspace densities in the right upper lobe and findings are suggestive for an infectious or inflammatory process.Patchy parenchymal densities along the periphery of the left lowerlobe. There is extensive airspace disease with consolidation in the lingula. No acute bone abnormality. Review of the MIP images confirms the above findings.  IMPRESSION:Negative for a pulmonary embolism. Patchy airspace disease in both lungs. There is confluent airspace disease in the lingula and the findings are  most compatible with pneumonia. Recommend follow-up to ensure resolution. Postsurgical changes consistent with a right lower lobectomy and resection of the right lower lobe lesion. There has been interval enlargement of mediastinal lymph nodes. These enlarged lymph nodes are nonspecific and could be reactive in etiology. Recommend attention to these areas on follow up imaging.        Electronically Signed  By: Markus Daft M.D.  On: 10/06/2013  17:34   ASSESSMENT: [name] 68 y.o. female with a history of Multiple Myeloma and Pulmonary Carcinoid.   PLAN:   1. IgG Kappa MM s/p autologous SCT (02/08/2013).  -- She completed 4 cycles of VRd and had SCT at [facility]. She is continuing to recover her counts and her energy is improving. We followed her counts and her WBC and Plts are recovered.  -- She will continue acyclovir and bactrim has been stopped as per protocol.  --We will implement vaccinations per protocol.  This was started on July 24th, 2015 at [facility].  She will require her 12 month post transplant shots including Tdap, HIB, IPOL, Hepatitis B energex B 20 mcg/ml and Prevnar 13) some time in January.  -- We recommended continuing maintenance therapy with Revlimid 10 mg daily for 21 days on followed by 7 days off. This is a catergory one recommendation based on The CALG 100104 trial demonstrating an improved median time to progression (46 months for lenalidomide group versus 27 months for placebo) and disease progression or death (37% versus 58% repectively). However, second primary cancers occurred more frequently with the lenalidomide group (8% versus 3% in placebo) and increased with of severe neutropenia.   She will continue full dose ASA with revlimid.  -- she completed her third cycle of Revlimid which was started on 09/17/2013 and was admitted to [facility] with Pneumonia. She was very sick from it and required 7 days of IV and 16 days of oral antibiotics.  --It was decided to change her chemotherapy from Revlimid to Velcade. She is going to get her first dose of Velcade today and she'll get it every 2 weeks for 2 years. She is going to get acyclovir twice a day for prophylaxis against shingles and varicella virus reactivation. --The common side effects of Velcade were discussed and include but are not limited to neuropathy constipation shingles and myelosuppression especially thrombocytopenia. --she will follow here  in 2 weeks to get her next dose and M.D. Visit. She'll also get a flu shot at that time.  2. 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  --1.7cm on final pathology from [facility]. 0/8 lymph nodes were negative. Normal margins. Grade G1.  --Check serum Chromogranin marker as part of her monthly labs.this marker is pending from today. -- She has a scan of chest and follow up coming next month for her resected carcinoid tumor with Dr Bea Laura.  3. Leukopenia (mild),Anemia secondary to #1.  --She is asymptomatic.  Continue to monitor.  4. Diabetes Type 2: Patient took herself off Metformin and consequently is showing high values for her non-fasting sugar testing. I recommended for her to resume her Metformin and also to check with her PCP.   Follow-up. She will have repeat counts a close follow in one month.  All questions were answered. The patient knows to call the clinic with any problems, questions or concerns. We can certainly see the patient much sooner if necessary.  I spent 20 minutes counseling the patient face to face. The  total time spent in the appointment was 30 minutes.    Bernadene Bell, MD Medical Hematologist/Oncologist [place] Pager: [phone] Office No: [phone]

## 2013-11-30 NOTE — Telephone Encounter (Signed)
Gave avs & cal for Nov.  °

## 2013-11-30 NOTE — Patient Instructions (Signed)
[facility] Discharge Instructions for Patients Receiving Chemotherapy  Today you received the following chemotherapy agents Velcade  To help prevent nausea and vomiting after your treatment, we encourage you to take your nausea medication as directed.   If you develop nausea and vomiting that is not controlled by your nausea medication, call the clinic.   BELOW ARE SYMPTOMS THAT SHOULD BE REPORTED IMMEDIATELY:  *FEVER GREATER THAN 100.5 F  *CHILLS WITH OR WITHOUT FEVER  NAUSEA AND VOMITING THAT IS NOT CONTROLLED WITH YOUR NAUSEA MEDICATION  *UNUSUAL SHORTNESS OF BREATH  *UNUSUAL BRUISING OR BLEEDING  TENDERNESS IN MOUTH AND THROAT WITH OR WITHOUT PRESENCE OF ULCERS  *URINARY PROBLEMS  *BOWEL PROBLEMS  UNUSUAL RASH Items with * indicate a potential emergency and should be followed up as soon as possible.  Feel free to call the clinic you have any questions or concerns. The clinic phone number is [phone].   Bortezomib injection What is this medicine? BORTEZOMIB (bor TEZ oh mib) is a chemotherapy drug. It slows the growth of cancer cells. This medicine is used to treat multiple myeloma, and certain lymphomas, such as mantle-cell lymphoma. This medicine may be used for other purposes; ask your health care provider or pharmacist if you have questions. COMMON BRAND NAME(S): Velcade What should I tell my health care provider before I take this medicine? They need to know if you have any of these conditions: -diabetes -heart disease -irregular heartbeat -liver disease -on hemodialysis -low blood counts, like low white blood cells, platelets, or hemoglobin -peripheral neuropathy -taking medicine for blood pressure -an unusual or allergic reaction to bortezomib, mannitol, boron, other medicines, foods, dyes, or preservatives -pregnant or trying to get pregnant -breast-feeding How should I use this medicine? This medicine is for injection into a vein or  for injection under the skin. It is given by a health care professional in a hospital or clinic setting. Talk to your pediatrician regarding the use of this medicine in children. Special care may be needed. Overdosage: If you think you have taken too much of this medicine contact a poison control center or emergency room at once. NOTE: This medicine is only for you. Do not share this medicine with others. What if I miss a dose? It is important not to miss your dose. Call your doctor or health care professional if you are unable to keep an appointment. What may interact with this medicine? This medicine may interact with the following medications: -ketoconazole -rifampin -ritonavir -St. John's Wort This list may not describe all possible interactions. Give your health care provider a list of all the medicines, herbs, non-prescription drugs, or dietary supplements you use. Also tell them if you smoke, drink alcohol, or use illegal drugs. Some items may interact with your medicine. What should I watch for while using this medicine? Visit your doctor for checks on your progress. This drug may make you feel generally unwell. This is not uncommon, as chemotherapy can affect healthy cells as well as cancer cells. Report any side effects. Continue your course of treatment even though you feel ill unless your doctor tells you to stop. You may get drowsy or dizzy. Do not drive, use machinery, or do anything that needs mental alertness until you know how this medicine affects you. Do not stand or sit up quickly, especially if you are an older patient. This reduces the risk of dizzy or fainting spells. In some cases, you may be given additional medicines to help with side   effects. Follow all directions for their use. Call your doctor or health care professional for advice if you get a fever, chills or sore throat, or other symptoms of a cold or flu. Do not treat yourself. This drug decreases your body's ability  to fight infections. Try to avoid being around people who are sick. This medicine may increase your risk to bruise or bleed. Call your doctor or health care professional if you notice any unusual bleeding. You may need blood work done while you are taking this medicine. In some patients, this medicine may cause a serious brain infection that may cause death. If you have any problems seeing, thinking, speaking, walking, or standing, tell your doctor right away. If you cannot reach your doctor, urgently seek other source of medical care. Do not become pregnant while taking this medicine. Women should inform their doctor if they wish to become pregnant or think they might be pregnant. There is a potential for serious side effects to an unborn child. Talk to your health care professional or pharmacist for more information. Do not breast-feed an infant while taking this medicine. Check with your doctor or health care professional if you get an attack of severe diarrhea, nausea and vomiting, or if you sweat a lot. The loss of too much body fluid can make it dangerous for you to take this medicine. What side effects may I notice from receiving this medicine? Side effects that you should report to your doctor or health care professional as soon as possible: -allergic reactions like skin rash, itching or hives, swelling of the face, lips, or tongue -breathing problems -changes in hearing -changes in vision -fast, irregular heartbeat -feeling faint or lightheaded, falls -pain, tingling, numbness in the hands or feet -right upper belly pain -seizures -swelling of the ankles, feet, hands -unusual bleeding or bruising -unusually weak or tired -vomiting -yellowing of the eyes or skin Side effects that usually do not require medical attention (report to your doctor or health care professional if they continue or are bothersome): -changes in emotions or moods -constipation -diarrhea -loss of  appetite -headache -irritation at site where injected -nausea This list may not describe all possible side effects. Call your doctor for medical advice about side effects. You may report side effects to FDA at 1-800-FDA-1088. Where should I keep my medicine? This drug is given in a hospital or clinic and will not be stored at home. NOTE: This sheet is a summary. It may not cover all possible information. If you have questions about this medicine, talk to your doctor, pharmacist, or health care provider.  2015, Elsevier/Gold Standard. (2012-11-08 12:46:32)  

## 2013-12-01 ENCOUNTER — Encounter: Payer: Self-pay | Admitting: *Deleted

## 2013-12-01 ENCOUNTER — Telehealth: Payer: Self-pay | Admitting: *Deleted

## 2013-12-01 NOTE — Telephone Encounter (Signed)
PT. IS EATING AND FORCING FLUIDS. NO NAUSEA OR VOMITING. DISCUSSED WITH PT. HOW TO TAKE HER ANTIEMETICS. NOT DIARRHEA. LAST GOOD BOWEL MOVEMENT WAS 11/30/13. NO MOUTH ISSUES. PT. WILL CALL THIS OFFICE 24/7 IF ANY PROBLEMS OR CONCERNS ARISE.

## 2013-12-06 LAB — PROTEIN ELECTROPHORESIS, SERUM, WITH REFLEX
Albumin ELP: 62.3 % (ref 55.8–66.1)
Alpha-1-Globulin: 4.3 % (ref 2.9–4.9)
Alpha-2-Globulin: 10.8 % (ref 7.1–11.8)
Beta 2: 4.5 % (ref 3.2–6.5)
Beta Globulin: 7 % (ref 4.7–7.2)
Gamma Globulin: 11.1 % (ref 11.1–18.8)
TOTAL PROTEIN, SERUM ELECTROPHOR: 6.2 g/dL (ref 6.0–8.3)

## 2013-12-06 LAB — CHROMOGRANIN A: CHROMOGRANIN A: 50 ng/mL — AB (ref <=15)

## 2013-12-09 DIAGNOSIS — C9000 Multiple myeloma not having achieved remission: Secondary | ICD-10-CM | POA: Diagnosis not present

## 2013-12-09 DIAGNOSIS — Z85110 Personal history of malignant carcinoid tumor of bronchus and lung: Secondary | ICD-10-CM | POA: Diagnosis not present

## 2013-12-09 DIAGNOSIS — R0609 Other forms of dyspnea: Secondary | ICD-10-CM | POA: Diagnosis not present

## 2013-12-09 DIAGNOSIS — E119 Type 2 diabetes mellitus without complications: Secondary | ICD-10-CM | POA: Diagnosis not present

## 2013-12-09 DIAGNOSIS — Z87891 Personal history of nicotine dependence: Secondary | ICD-10-CM | POA: Diagnosis not present

## 2013-12-09 DIAGNOSIS — K219 Gastro-esophageal reflux disease without esophagitis: Secondary | ICD-10-CM | POA: Diagnosis not present

## 2013-12-09 DIAGNOSIS — I5032 Chronic diastolic (congestive) heart failure: Secondary | ICD-10-CM | POA: Diagnosis not present

## 2013-12-09 DIAGNOSIS — R931 Abnormal findings on diagnostic imaging of heart and coronary circulation: Secondary | ICD-10-CM | POA: Diagnosis not present

## 2013-12-09 DIAGNOSIS — I10 Essential (primary) hypertension: Secondary | ICD-10-CM | POA: Diagnosis not present

## 2013-12-09 DIAGNOSIS — Z8701 Personal history of pneumonia (recurrent): Secondary | ICD-10-CM | POA: Diagnosis not present

## 2013-12-09 DIAGNOSIS — E785 Hyperlipidemia, unspecified: Secondary | ICD-10-CM | POA: Diagnosis not present

## 2013-12-09 DIAGNOSIS — Z9221 Personal history of antineoplastic chemotherapy: Secondary | ICD-10-CM | POA: Diagnosis not present

## 2013-12-09 DIAGNOSIS — R5383 Other fatigue: Secondary | ICD-10-CM | POA: Diagnosis not present

## 2013-12-12 ENCOUNTER — Other Ambulatory Visit: Payer: Self-pay

## 2013-12-12 DIAGNOSIS — C7A090 Malignant carcinoid tumor of the bronchus and lung: Secondary | ICD-10-CM

## 2013-12-13 ENCOUNTER — Telehealth: Payer: Self-pay | Admitting: Hematology

## 2013-12-13 ENCOUNTER — Telehealth: Payer: Self-pay | Admitting: *Deleted

## 2013-12-13 NOTE — Telephone Encounter (Signed)
Per staff message and patient request I have moved appts

## 2013-12-13 NOTE — Telephone Encounter (Signed)
LM to confirm appt Moved to thurs per VM pt left 12/12/13.

## 2013-12-13 NOTE — Telephone Encounter (Signed)
Pt called back and wanted it to be moved back to original d/t for 12/14/13. Pt is aware of appt d/t.

## 2013-12-14 ENCOUNTER — Ambulatory Visit (HOSPITAL_BASED_OUTPATIENT_CLINIC_OR_DEPARTMENT_OTHER): Payer: Medicare Other | Admitting: Hematology

## 2013-12-14 ENCOUNTER — Encounter: Payer: Self-pay | Admitting: Hematology

## 2013-12-14 ENCOUNTER — Ambulatory Visit: Payer: Medicare Other

## 2013-12-14 ENCOUNTER — Telehealth: Payer: Self-pay | Admitting: Hematology

## 2013-12-14 ENCOUNTER — Other Ambulatory Visit: Payer: Medicare Other

## 2013-12-14 ENCOUNTER — Ambulatory Visit (HOSPITAL_BASED_OUTPATIENT_CLINIC_OR_DEPARTMENT_OTHER): Payer: Medicare Other

## 2013-12-14 ENCOUNTER — Other Ambulatory Visit (HOSPITAL_BASED_OUTPATIENT_CLINIC_OR_DEPARTMENT_OTHER): Payer: Medicare Other

## 2013-12-14 VITALS — BP 157/63 | HR 86 | Temp 98.1°F | Resp 18 | Ht 65.0 in | Wt 202.3 lb

## 2013-12-14 DIAGNOSIS — D630 Anemia in neoplastic disease: Secondary | ICD-10-CM

## 2013-12-14 DIAGNOSIS — C7A090 Malignant carcinoid tumor of the bronchus and lung: Secondary | ICD-10-CM

## 2013-12-14 DIAGNOSIS — C9000 Multiple myeloma not having achieved remission: Secondary | ICD-10-CM

## 2013-12-14 DIAGNOSIS — D72819 Decreased white blood cell count, unspecified: Secondary | ICD-10-CM | POA: Diagnosis not present

## 2013-12-14 DIAGNOSIS — E119 Type 2 diabetes mellitus without complications: Secondary | ICD-10-CM | POA: Diagnosis not present

## 2013-12-14 DIAGNOSIS — Z5112 Encounter for antineoplastic immunotherapy: Secondary | ICD-10-CM

## 2013-12-14 DIAGNOSIS — C9001 Multiple myeloma in remission: Secondary | ICD-10-CM

## 2013-12-14 LAB — COMPREHENSIVE METABOLIC PANEL (CC13)
ALT: 56 U/L — AB (ref 0–55)
ANION GAP: 13 meq/L — AB (ref 3–11)
AST: 40 U/L — ABNORMAL HIGH (ref 5–34)
Albumin: 3.7 g/dL (ref 3.5–5.0)
Alkaline Phosphatase: 107 U/L (ref 40–150)
BUN: 18.9 mg/dL (ref 7.0–26.0)
CALCIUM: 9.6 mg/dL (ref 8.4–10.4)
CHLORIDE: 106 meq/L (ref 98–109)
CO2: 21 meq/L — AB (ref 22–29)
Creatinine: 0.8 mg/dL (ref 0.6–1.1)
Glucose: 184 mg/dl — ABNORMAL HIGH (ref 70–140)
POTASSIUM: 4.5 meq/L (ref 3.5–5.1)
Sodium: 140 mEq/L (ref 136–145)
TOTAL PROTEIN: 6.5 g/dL (ref 6.4–8.3)
Total Bilirubin: 0.49 mg/dL (ref 0.20–1.20)

## 2013-12-14 LAB — CBC WITH DIFFERENTIAL/PLATELET
BASO%: 0.3 % (ref 0.0–2.0)
Basophils Absolute: 0 10*3/uL (ref 0.0–0.1)
EOS%: 3.8 % (ref 0.0–7.0)
Eosinophils Absolute: 0.2 10*3/uL (ref 0.0–0.5)
HEMATOCRIT: 34.7 % — AB (ref 34.8–46.6)
HGB: 11.3 g/dL — ABNORMAL LOW (ref 11.6–15.9)
LYMPH#: 0.6 10*3/uL — AB (ref 0.9–3.3)
LYMPH%: 15.5 % (ref 14.0–49.7)
MCH: 31.7 pg (ref 25.1–34.0)
MCHC: 32.6 g/dL (ref 31.5–36.0)
MCV: 97.2 fL (ref 79.5–101.0)
MONO#: 0.3 10*3/uL (ref 0.1–0.9)
MONO%: 8.5 % (ref 0.0–14.0)
NEUT#: 2.9 10*3/uL (ref 1.5–6.5)
NEUT%: 71.9 % (ref 38.4–76.8)
Platelets: 146 10*3/uL (ref 145–400)
RBC: 3.57 10*6/uL — AB (ref 3.70–5.45)
RDW: 16.4 % — AB (ref 11.2–14.5)
WBC: 4 10*3/uL (ref 3.9–10.3)

## 2013-12-14 MED ORDER — ONDANSETRON HCL 8 MG PO TABS
ORAL_TABLET | ORAL | Status: AC
  Filled 2013-12-14: qty 1

## 2013-12-14 MED ORDER — BORTEZOMIB CHEMO SQ INJECTION 3.5 MG (2.5MG/ML)
1.3000 mg/m2 | Freq: Once | INTRAMUSCULAR | Status: AC
Start: 2013-12-14 — End: 2013-12-14
  Administered 2013-12-14: 2.75 mg via SUBCUTANEOUS
  Filled 2013-12-14: qty 2.75

## 2013-12-14 MED ORDER — ONDANSETRON HCL 8 MG PO TABS
8.0000 mg | ORAL_TABLET | Freq: Once | ORAL | Status: AC
  Administered 2013-12-14: 8 mg via ORAL

## 2013-12-14 NOTE — Progress Notes (Signed)
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  REASON FOR OFFICE VISIT: Follow up.  CURRENT TREATMENT: Revlimid 10 mg daily for 21 days on and then 7 days off started on 07/22/2013. 11/30/2013 we switched her Maintenance therapy to sq Velcade for 2 years upon recommendations from [facility]. Today is second dose of Velcade.    Multiple myeloma in remission   05/27/2012 Initial Diagnosis Multiple myeloma, without mention of having achieved remission(203.00)   05/27/2012 Cancer Staging 1.  IgG kappa multiple myeloma. She presented in 05/2012 with anemia, Cr 0.8, Ca 9.6; no bone lytic lesion; LDH 140; beta2- microglobulin 1.86 (ref 1.01-1.73). Positive SPEP for M-spike at 1.69, IgG 2080, kappa 7.24, kappa:lambda ratio 24.97; UPEP positive   06/08/2012 Bone Marrow Biopsy Bone marrow biopsy on 06/08/12 showed 19% plasma cell; normal cytogenetics; myeloma FISH pending.    06/14/2012 Imaging Skeletal survey negative for bone lytic lesions.  Lung nodule apparent.   09/17/2012 Bone Marrow Biopsy Bone marrow biopsy showed 11% plasma cells.   10/18/2012 - 12/31/2012 Chemotherapy Started on VRD (Velcade 1.3 mg/m^2 Lindsay days 1,8 and 15; Revlimide 25 mg PO daily, on day 1 through 14; Decadron 40 mg PO Days 1, 8, 15).   Started on coumadin given RD.   01/14/2013 Bone Marrow Biopsy The bone marrow clot sections are normocellular toslightly hypercellular for age (approximately 40% overall). 1% plasma cells.   02/08/2013 Bone Marrow Transplant Auto Transplant at [facility] (Dr. Glorianne Manchester McIver).   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 Lamonte Sakai)   07/08/2012 Pathology  Results Pathology consistent with Non-small cell lung cancer of RLL.    07/14/2012 Imaging 1. Right lower lobe nodule is non hypermetabolic which suggests benign etiology considering the slow growing rate in 6 years or non FDG avid disease as seen with carcinoid tumors. Pending biopsy. No PET evidence of mediastinal nodal disease.    08/02/2012 Imaging MRI of brain negative.   08/12/2012 Surgery Robotic Lobectomy of R Lower lobe.    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     Surgical resection margins are negative.     Two benign lymph nodes.     pTa1N0   INTERVAL HISTORY: [name] 68 y.o. female returns for her follow-up. She was last seen by me on 11/30/2013.  She is status post SCT on 02/08/2013 as noted above.  She is doing fine.  She saw Dr. Harvel Ricks 2 weeks ago in October 2015.  He said everything appeared fine and to follow up  to start her immunizations.  In 1 month, surgery plans to  repeat CT of chest with Dr. Bea Laura.  Her energy is slowly improving.   She is independent of all activities of daily living. Her third cycle of Revlimid started on 09/17/2013.  She is taking revlimid 10 mg daily for 21 days and then off for 7 days.  She is now off Bactrim but continues to take Acyclovir. She also used to take metformin for 3 years but stopped that at time of transplant. Looking at her blood sugars here she  is running high and I asked her to resume her metformin 500 mg daily and also bring this to notice of her PCP and get hemoglobin A1C checked. Today her fasting sugar when she checked was 116. Blood glucose here was 181.   Patient was hospitalized with pneumonia and treated with IV antibiotics for 7 days. She is admitted on 10/06/2013 and discharged on 10/13/2013.she had a major left-sided pneumonia. She also had echocardiogram done which showed normal ejection fraction. After discharge she was seen by her family  doctor and was given 10 additional days of antibiotics so she took a total of 16 days of oral antibiotics after discharge from the hospital. It was discussed at [facility] that the Revlimid may be causing her more immunocompromised and it would be a good strategy to switch her to subcutaneous Velcade therapy every 2 weeks for 2 years. Today, she is going to get her first dose of Velcade subcutaneous. She hasn't received her flu shot and I will plan to give it to her on her next visit in about 2 weeks. Her last day of Revlimid was 10/06/2013.   Patient denies anorexia, headache, visual changes, confusion, drenching night sweats, palpable lymph node swelling, mucositis, odynophagia, dysphagia, jaundice, chest pain, palpitation, occasional shortness of breath, dyspnea on exertion, productive cough, gum bleeding, epistaxis, hematemesis, hemoptysis, abdominal pain, abdominal swelling, early satiety, melena, hematochezia, hematuria, skin rash, spontaneous bleeding, joint swelling, joint pain, heat or cold intolerance, bowel bladder incontinence, back pain, focal motor weakness, paresthesia.   She mentioned today about taking a multivitamin which has some vitamin C, vitamin A , zinc and it and that is fine.she'll follow up coming up at [facility] in January 2016. She was seen by her cardiologist and was given a clearance. She is not voicing any new problems today. She tolerated first dose of Velcade well and today is her second dose of maintenance therapy. After this I will have her follow with Dr. Alvy Bimler in a month.   MEDICAL HISTORY: Past Medical History  Diagnosis Date  . Osteoporosis   . Hypercholesterolemia   . Esophageal reflux disease   . Hypertension   . Diabetes type 2, controlled   . Pancytopenia   . Ischemic colitis 2009  . Lung nodule 06/11/2012  . Depression   . Sleep apnea     UPPER AIRWAY RESISTANCE  DOES NOT HAVE CPAP , MAY NEED   . Tuberculosis      (+ TB TEST)YEARS AGO POSSIBLE  EXPOSURE WAS MED TX   . Multiple myeloma 05/2012    Cytogenetics on 06/08/2012 was normal 45, XX [20]  . Liver enzyme elevation   . Sepsis   . 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    ALLERGIES:  has No Known Allergies.  MEDICATIONS: has a current medication list which includes the following prescription(s): acyclovir, bupropion, esomeprazole, losartan, magnesium chloride, metoprolol succinate, multivitamin with minerals,  preservision areds 2, sertraline, and simvastatin.  SURGICAL HISTORY:  Past Surgical History  Procedure Laterality Date  . Colonoscopy  2013    poly; Dr. Oletta Lamas, next one due 2018.l  . Appendectomy    . Tonsillectomy    . Leg surgery    . Video bronchoscopy with endobronchial navigation Right 07/08/2012    Procedure: VIDEO BRONCHOSCOPY WITH ENDOBRONCHIAL NAVIGATION;  Surgeon: Collene Gobble, MD;  Location: [facility];  Service: Thoracic;  Laterality: Right;    REVIEW OF SYSTEMS:   Constitutional: Denies fevers, chills or abnormal weight loss, blood sugars running high. Eyes: Denies blurriness of vision Ears, nose, mouth, throat, and face: Denies mucositis or sore throat Respiratory: Denies cough, dyspnea or  wheezes Cardiovascular: Denies palpitation, chest discomfort or lower extremity swelling Gastrointestinal:  Denies nausea, heartburn or change in bowel habits Skin: Denies abnormal skin rashes Lymphatics: Denies new lymphadenopathy or easy bruising Neurological:Denies numbness, tingling or new weaknesses Behavioral/Psych: Mood is stable, no new changes  All other systems were reviewed with the patient and are negative.  PHYSICAL EXAMINATION: ECOG PERFORMANCE STATUS: 1  Blood pressure 157/63, pulse 86, temperature 98.1 F (36.7 C), temperature source Oral, resp. rate 18, height _0  (1.651 m), weight 202 lb 4.8 oz (91.763 kg), SpO2 99 %.  GENERAL:alert, no distress and comfortable; well developed and well nourished.  Hair growing back.  SKIN: skin color, texture, turgor are normal, no rashes or significant lesions EYES: normal, Conjunctiva are pink and non-injected, sclera clear OROPHARYNX:no exudate, no erythema and lips, buccal mucosa, and tongue normal  NECK: supple, thyroid normal size, non-tender, without nodularity LYMPH:  no palpable lymphadenopathy in the cervical, axillary or supraclavicular LUNGS: clear to auscultation with normal breathing effort, no wheezes or rhonchi HEART: regular rate & rhythm and no murmurs and no lower extremity edema ABDOMEN:abdomen soft, non-tender and normal bowel sounds Musculoskeletal:no cyanosis of digits and no clubbing  NEURO: alert & oriented x 3 with fluent speech, no focal motor/sensory deficits  Labs:              RADIOGRAPHIC STUDIES:  CLINICAL DATA: Left-sided chest pain for the last few days. Occasional shortness of breath. Recent hospitalization for pneumonia.  EXAM: CHEST 2 VIEW 11/10/2013  COMPARISON: Chest x-ray 10/11/2013 and chest CT 10/06/2013  FINDINGS: The cardiac silhouette, mediastinal and hilar contours are within normal limits and stable. There is mild tortuosity of the thoracic aorta. There is a  small to moderate-sized right pleural effusion.The left lower lobe pneumonia has largely cleared. There may be some residual post pneumonic atelectasis or scarring but no airspace consolidation. No left-sided pleural effusion. The bony thorax is intact.  IMPRESSION:Small to moderate-sized right pleural effusion increased since prior CT scan.Clearing of left lung infiltrate with probable post pneumonic atelectasis and scarring.    Electronically Signed  By: Kalman Jewels M.D. On: 11/10/2013 16:48  EXAM: CT ANGIOGRAPHY CHEST WITH CONTRAST 10/06/2013 TECHNIQUE: Multidetector CT imaging of the chest was performed using the standard protocol during bolus administration of intravenous contrast. Multiplanar CT image reconstructions and MIPs were obtained to evaluate the vascular anatomy. CONTRAST: 154m OMNIPAQUE IOHEXOL 350 MG/ML SOLN COMPARISON: Chest CT 06/14/2012  FINDINGS: No evidence for a pulmonary embolism. Postoperative changes compatible with a right lower lobectomy. There has been interval enlargement of a right mediastinal lymph node which measures 1.2 cm in the short axis on sequence 401, image 16. Small lymph nodes in the supraclavicular regions. There is also interval enlargement of a prevascular lymph node that  measures 1.3 cm on image 27. No significant pericardial or pleural fluid. No acute abnormalities within the upper abdomen. There are patchy airspace densities in the right upper lobe and findings are suggestive for an infectious or inflammatory process.Patchy parenchymal densities along the periphery of the left lowerlobe. There is extensive airspace disease with consolidation in the lingula. No acute bone abnormality. Review of the MIP images confirms the above findings.  IMPRESSION:Negative for a pulmonary embolism. Patchy airspace disease in both lungs. There is confluent airspace disease in the lingula and the findings are most compatible with pneumonia.  Recommend follow-up to ensure resolution. Postsurgical changes consistent with a right lower lobectomy and resection of the right lower lobe lesion. There has been interval enlargement of mediastinal lymph nodes. These enlarged lymph nodes are nonspecific and could be reactive in etiology. Recommend attention to these areas on follow up imaging.        Electronically Signed  By: Markus Daft M.D.  On: 10/06/2013 17:34   ASSESSMENT: TIAWANA FORGY 68 y.o. female with a history of Multiple Myeloma and Pulmonary Carcinoid.   PLAN:   1. IgG Kappa MM s/p autologous SCT (02/08/2013).  -- She completed 4 cycles of VRd and had SCT at [facility]. She is continuing to recover her counts and her energy is improving. We followed her counts and her WBC and Plts are recovered.  -- She will continue acyclovir and bactrim has been stopped as per protocol.  --We will implement vaccinations per protocol.  This was started on July 24th, 2015 at [facility].  She will require her 12 month post transplant shots including Tdap, HIB, IPOL, Hepatitis B energex B 20 mcg/ml and Prevnar 13) some time in January.  -- She started maintenance therapy with Revlimid 10 mg daily for 21 days on followed by 7 days off. This is a catergory one recommendation based on The CALG 100104 trial demonstrating an improved median time to progression (46 months for lenalidomide group versus 27 months for placebo) and disease progression or death (37% versus 58% repectively). However, second primary cancers occurred more frequently with the lenalidomide group (8% versus 3% in placebo) and increased with of severe neutropenia.   She will continue full dose ASA with revlimid.  -- she completed her third cycle of Revlimid which was started on 09/17/2013 and was admitted to [facility] with Pneumonia. She was very sick from it and required 7 days of IV and 16 days of oral antibiotics.  --It was decided to change her chemotherapy from Revlimid  to Velcade. She got her first dose of Velcade 11/30/2013 and she will get it every 2 weeks for 2 years. She is going to take acyclovir twice a day for prophylaxis against shingles and varicella virus reactivation. --The common side effects of Velcade were discussed and include but are not limited to neuropathy constipation shingles and myelosuppression especially thrombocytopenia. --she will follow here in 2 weeks to get her next dose and M.D. Visit in 4 weeks with Dr Alvy Bimler.  2. 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  --1.7cm on final pathology from [facility]. 0/8 lymph nodes were negative. Normal margins. Grade G1.  --Check serum Chromogranin marker as part of her monthly labs. It was 50 elevated on 11/30/2013. I told the patient that the trend for chromogranin is more important than one absolute value and we will recheck it in one month. -- She has a scan of chest and follow up coming next  month for her resected carcinoid tumor with Dr Bea Laura.  3. Leukopenia (mild),Anemia secondary to #1.  --She is asymptomatic.  White count and hemoglobin are both better and improved on velcade.  4. Diabetes Type 2: Patient took herself off Metformin and consequently is showing high values for her non-fasting sugar testing. I recommended for her to resume her Metformin and also to check with her PCP.   Follow-up. She will have repeat counts cbc,bmp in 2 weeks and 4 weeks. Myeloma markers and Chromogranin in 1 month.  All questions were answered. The patient knows to call the clinic with any problems, questions or concerns. We can certainly see the patient much sooner if necessary.  I spent 20 minutes counseling the patient face to face. The total time spent in the appointment was 25 minutes.    Jessica Bell, MD Medical Hematologist/Oncologist [place] Pager: [phone] Office No: [phone]

## 2013-12-14 NOTE — Telephone Encounter (Signed)
gv and printed appt sched and avs for pt for Dec....sed added tx

## 2013-12-14 NOTE — Patient Instructions (Signed)
[facility] Discharge Instructions for Patients Receiving Chemotherapy  Today you received the following chemotherapy agents Velcade  To help prevent nausea and vomiting after your treatment, we encourage you to take your nausea medication    If you develop nausea and vomiting that is not controlled by your nausea medication, call the clinic.   BELOW ARE SYMPTOMS THAT SHOULD BE REPORTED IMMEDIATELY:  *FEVER GREATER THAN 100.5 F  *CHILLS WITH OR WITHOUT FEVER  NAUSEA AND VOMITING THAT IS NOT CONTROLLED WITH YOUR NAUSEA MEDICATION  *UNUSUAL SHORTNESS OF BREATH  *UNUSUAL BRUISING OR BLEEDING  TENDERNESS IN MOUTH AND THROAT WITH OR WITHOUT PRESENCE OF ULCERS  *URINARY PROBLEMS  *BOWEL PROBLEMS  UNUSUAL RASH Items with * indicate a potential emergency and should be followed up as soon as possible.  Feel free to call the clinic you have any questions or concerns. The clinic phone number is [phone].

## 2013-12-15 ENCOUNTER — Ambulatory Visit: Payer: Medicare Other

## 2013-12-15 ENCOUNTER — Other Ambulatory Visit: Payer: Medicare Other

## 2013-12-19 ENCOUNTER — Telehealth: Payer: Self-pay

## 2013-12-19 NOTE — Telephone Encounter (Signed)
Pt called asking about decrease in acyclovir dose. Was 800 mg BID and is now 400 mg BID. S/w Dr Lona Kettle and called pt back. It is correct to take the 400 mg BID. The medication has possibility of being nephrotoxic. Pt expressed understanding.

## 2013-12-26 ENCOUNTER — Telehealth: Payer: Self-pay | Admitting: *Deleted

## 2013-12-26 NOTE — Telephone Encounter (Signed)
Former pt of Dr. Lona Kettle is scheduled for lab/velcade on Wed 12/2.  She doesn't meet Dr. Alvy Bimler until following week on 12/16. She is asking if this is correct or should she be seeing Dr. Alvy Bimler this week?Marland Kitchen

## 2013-12-26 NOTE — Telephone Encounter (Signed)
AS long as she feels fine that's OK with me

## 2013-12-26 NOTE — Telephone Encounter (Signed)
Pt says she feels fine and she will keep her appts as scheduled and see Dr. Alvy Bimler on 12/16.  Instructed pt to call if anything changes. She verbalized understanding.

## 2013-12-28 ENCOUNTER — Ambulatory Visit (HOSPITAL_BASED_OUTPATIENT_CLINIC_OR_DEPARTMENT_OTHER): Payer: Medicare Other

## 2013-12-28 ENCOUNTER — Other Ambulatory Visit (HOSPITAL_BASED_OUTPATIENT_CLINIC_OR_DEPARTMENT_OTHER): Payer: Medicare Other

## 2013-12-28 VITALS — BP 134/65 | HR 92 | Temp 98.3°F | Resp 18

## 2013-12-28 DIAGNOSIS — C9000 Multiple myeloma not having achieved remission: Secondary | ICD-10-CM

## 2013-12-28 DIAGNOSIS — Z5112 Encounter for antineoplastic immunotherapy: Secondary | ICD-10-CM

## 2013-12-28 DIAGNOSIS — C9001 Multiple myeloma in remission: Secondary | ICD-10-CM

## 2013-12-28 LAB — COMPREHENSIVE METABOLIC PANEL (CC13)
ALBUMIN: 3.4 g/dL — AB (ref 3.5–5.0)
ALT: 40 U/L (ref 0–55)
AST: 27 U/L (ref 5–34)
Alkaline Phosphatase: 111 U/L (ref 40–150)
Anion Gap: 13 mEq/L — ABNORMAL HIGH (ref 3–11)
BUN: 21.4 mg/dL (ref 7.0–26.0)
CO2: 24 mEq/L (ref 22–29)
Calcium: 9.8 mg/dL (ref 8.4–10.4)
Chloride: 103 mEq/L (ref 98–109)
Creatinine: 0.9 mg/dL (ref 0.6–1.1)
Glucose: 184 mg/dl — ABNORMAL HIGH (ref 70–140)
POTASSIUM: 4.3 meq/L (ref 3.5–5.1)
Sodium: 140 mEq/L (ref 136–145)
Total Bilirubin: 0.5 mg/dL (ref 0.20–1.20)
Total Protein: 6.2 g/dL — ABNORMAL LOW (ref 6.4–8.3)

## 2013-12-28 LAB — CBC WITH DIFFERENTIAL/PLATELET
BASO%: 0.5 % (ref 0.0–2.0)
Basophils Absolute: 0 10*3/uL (ref 0.0–0.1)
EOS%: 3.1 % (ref 0.0–7.0)
Eosinophils Absolute: 0.1 10*3/uL (ref 0.0–0.5)
HCT: 34 % — ABNORMAL LOW (ref 34.8–46.6)
HEMOGLOBIN: 10.9 g/dL — AB (ref 11.6–15.9)
LYMPH%: 13.2 % — AB (ref 14.0–49.7)
MCH: 31.9 pg (ref 25.1–34.0)
MCHC: 32.1 g/dL (ref 31.5–36.0)
MCV: 99.3 fL (ref 79.5–101.0)
MONO#: 0.3 10*3/uL (ref 0.1–0.9)
MONO%: 8.1 % (ref 0.0–14.0)
NEUT#: 3 10*3/uL (ref 1.5–6.5)
NEUT%: 75.1 % (ref 38.4–76.8)
PLATELETS: 150 10*3/uL (ref 145–400)
RBC: 3.42 10*6/uL — ABNORMAL LOW (ref 3.70–5.45)
RDW: 17.4 % — ABNORMAL HIGH (ref 11.2–14.5)
WBC: 4 10*3/uL (ref 3.9–10.3)
lymph#: 0.5 10*3/uL — ABNORMAL LOW (ref 0.9–3.3)

## 2013-12-28 LAB — BASIC METABOLIC PANEL (CC13)
Anion Gap: 11 mEq/L (ref 3–11)
BUN: 21.1 mg/dL (ref 7.0–26.0)
CALCIUM: 9.8 mg/dL (ref 8.4–10.4)
CHLORIDE: 104 meq/L (ref 98–109)
CO2: 26 mEq/L (ref 22–29)
Creatinine: 0.8 mg/dL (ref 0.6–1.1)
Glucose: 184 mg/dl — ABNORMAL HIGH (ref 70–140)
Potassium: 4.3 mEq/L (ref 3.5–5.1)
Sodium: 141 mEq/L (ref 136–145)

## 2013-12-28 MED ORDER — ONDANSETRON HCL 8 MG PO TABS
8.0000 mg | ORAL_TABLET | Freq: Once | ORAL | Status: AC
  Administered 2013-12-28: 8 mg via ORAL

## 2013-12-28 MED ORDER — ONDANSETRON HCL 8 MG PO TABS
ORAL_TABLET | ORAL | Status: AC
  Filled 2013-12-28: qty 1

## 2013-12-28 MED ORDER — BORTEZOMIB CHEMO SQ INJECTION 3.5 MG (2.5MG/ML)
1.3000 mg/m2 | Freq: Once | INTRAMUSCULAR | Status: AC
  Administered 2013-12-28: 2.75 mg via SUBCUTANEOUS
  Filled 2013-12-28: qty 2.75

## 2013-12-28 NOTE — Patient Instructions (Signed)
Bortezomib injection What is this medicine? BORTEZOMIB (bor TEZ oh mib) is a chemotherapy drug. It slows the growth of cancer cells. This medicine is used to treat multiple myeloma, and certain lymphomas, such as mantle-cell lymphoma. This medicine may be used for other purposes; ask your health care provider or pharmacist if you have questions. COMMON BRAND NAME(S): Velcade What should I tell my health care provider before I take this medicine? They need to know if you have any of these conditions: -diabetes -heart disease -irregular heartbeat -liver disease -on hemodialysis -low blood counts, like low white blood cells, platelets, or hemoglobin -peripheral neuropathy -taking medicine for blood pressure -an unusual or allergic reaction to bortezomib, mannitol, boron, other medicines, foods, dyes, or preservatives -pregnant or trying to get pregnant -breast-feeding How should I use this medicine? This medicine is for injection into a vein or for injection under the skin. It is given by a health care professional in a hospital or clinic setting. Talk to your pediatrician regarding the use of this medicine in children. Special care may be needed. Overdosage: If you think you have taken too much of this medicine contact a poison control center or emergency room at once. NOTE: This medicine is only for you. Do not share this medicine with others. What if I miss a dose? It is important not to miss your dose. Call your doctor or health care professional if you are unable to keep an appointment. What may interact with this medicine? This medicine may interact with the following medications: -ketoconazole -rifampin -ritonavir -St. John's Wort This list may not describe all possible interactions. Give your health care provider a list of all the medicines, herbs, non-prescription drugs, or dietary supplements you use. Also tell them if you smoke, drink alcohol, or use illegal drugs. Some items  may interact with your medicine. What should I watch for while using this medicine? Visit your doctor for checks on your progress. This drug may make you feel generally unwell. This is not uncommon, as chemotherapy can affect healthy cells as well as cancer cells. Report any side effects. Continue your course of treatment even though you feel ill unless your doctor tells you to stop. You may get drowsy or dizzy. Do not drive, use machinery, or do anything that needs mental alertness until you know how this medicine affects you. Do not stand or sit up quickly, especially if you are an older patient. This reduces the risk of dizzy or fainting spells. In some cases, you may be given additional medicines to help with side effects. Follow all directions for their use. Call your doctor or health care professional for advice if you get a fever, chills or sore throat, or other symptoms of a cold or flu. Do not treat yourself. This drug decreases your body's ability to fight infections. Try to avoid being around people who are sick. This medicine may increase your risk to bruise or bleed. Call your doctor or health care professional if you notice any unusual bleeding. You may need blood work done while you are taking this medicine. In some patients, this medicine may cause a serious brain infection that may cause death. If you have any problems seeing, thinking, speaking, walking, or standing, tell your doctor right away. If you cannot reach your doctor, urgently seek other source of medical care. Do not become pregnant while taking this medicine. Women should inform their doctor if they wish to become pregnant or think they might be pregnant. There is   a potential for serious side effects to an unborn child. Talk to your health care professional or pharmacist for more information. Do not breast-feed an infant while taking this medicine. Check with your doctor or health care professional if you get an attack of  severe diarrhea, nausea and vomiting, or if you sweat a lot. The loss of too much body fluid can make it dangerous for you to take this medicine. What side effects may I notice from receiving this medicine? Side effects that you should report to your doctor or health care professional as soon as possible: -allergic reactions like skin rash, itching or hives, swelling of the face, lips, or tongue -breathing problems -changes in hearing -changes in vision -fast, irregular heartbeat -feeling faint or lightheaded, falls -pain, tingling, numbness in the hands or feet -right upper belly pain -seizures -swelling of the ankles, feet, hands -unusual bleeding or bruising -unusually weak or tired -vomiting -yellowing of the eyes or skin Side effects that usually do not require medical attention (report to your doctor or health care professional if they continue or are bothersome): -changes in emotions or moods -constipation -diarrhea -loss of appetite -headache -irritation at site where injected -nausea This list may not describe all possible side effects. Call your doctor for medical advice about side effects. You may report side effects to FDA at 1-800-FDA-1088. Where should I keep my medicine? This drug is given in a hospital or clinic and will not be stored at home. NOTE: This sheet is a summary. It may not cover all possible information. If you have questions about this medicine, talk to your doctor, pharmacist, or health care provider.  2015, Elsevier/Gold Standard. (2012-11-08 12:46:32)  

## 2013-12-30 ENCOUNTER — Telehealth: Payer: Self-pay | Admitting: *Deleted

## 2013-12-30 NOTE — Telephone Encounter (Signed)
Pt reports redness at injection site of Velcade from 2 days ago.  States redness about 1 1/2 inch in diameter and itching at times.  Denies any warmth or swelling or drainage from site.  Informed pt she can have some local irritation and redness from injection of any medication and I have seen pts develop irritation from Velcade.   Suggested she come in to clinic for RN to assess. Pt states she doesn't think it needs to be looked at as she is pretty confident it is not infected.  Instructed pt to notify us if it gets any worse or develop any s/s of infection as mentioned above.  Also suggested she use warm compress on site.   She verbalized understanding.

## 2014-01-11 ENCOUNTER — Other Ambulatory Visit: Payer: Self-pay | Admitting: Hematology and Oncology

## 2014-01-11 ENCOUNTER — Encounter: Payer: Self-pay | Admitting: Hematology and Oncology

## 2014-01-11 ENCOUNTER — Telehealth: Payer: Self-pay | Admitting: Hematology and Oncology

## 2014-01-11 ENCOUNTER — Ambulatory Visit (HOSPITAL_BASED_OUTPATIENT_CLINIC_OR_DEPARTMENT_OTHER): Payer: Medicare Other | Admitting: Hematology and Oncology

## 2014-01-11 ENCOUNTER — Ambulatory Visit (HOSPITAL_BASED_OUTPATIENT_CLINIC_OR_DEPARTMENT_OTHER): Payer: Medicare Other

## 2014-01-11 ENCOUNTER — Other Ambulatory Visit (HOSPITAL_BASED_OUTPATIENT_CLINIC_OR_DEPARTMENT_OTHER): Payer: Medicare Other

## 2014-01-11 VITALS — BP 136/61 | HR 20 | Temp 98.6°F | Resp 20 | Ht 65.0 in | Wt 201.6 lb

## 2014-01-11 DIAGNOSIS — C9001 Multiple myeloma in remission: Secondary | ICD-10-CM

## 2014-01-11 DIAGNOSIS — C9000 Multiple myeloma not having achieved remission: Secondary | ICD-10-CM

## 2014-01-11 DIAGNOSIS — M810 Age-related osteoporosis without current pathological fracture: Secondary | ICD-10-CM

## 2014-01-11 DIAGNOSIS — Z85110 Personal history of malignant carcinoid tumor of bronchus and lung: Secondary | ICD-10-CM | POA: Diagnosis not present

## 2014-01-11 DIAGNOSIS — K219 Gastro-esophageal reflux disease without esophagitis: Secondary | ICD-10-CM | POA: Diagnosis not present

## 2014-01-11 DIAGNOSIS — D1430 Benign neoplasm of unspecified bronchus and lung: Secondary | ICD-10-CM

## 2014-01-11 DIAGNOSIS — G629 Polyneuropathy, unspecified: Secondary | ICD-10-CM

## 2014-01-11 DIAGNOSIS — Z9484 Stem cells transplant status: Secondary | ICD-10-CM | POA: Diagnosis not present

## 2014-01-11 DIAGNOSIS — E119 Type 2 diabetes mellitus without complications: Secondary | ICD-10-CM

## 2014-01-11 DIAGNOSIS — Z5112 Encounter for antineoplastic immunotherapy: Secondary | ICD-10-CM

## 2014-01-11 DIAGNOSIS — R0982 Postnasal drip: Secondary | ICD-10-CM

## 2014-01-11 DIAGNOSIS — R05 Cough: Secondary | ICD-10-CM

## 2014-01-11 DIAGNOSIS — R059 Cough, unspecified: Secondary | ICD-10-CM

## 2014-01-11 HISTORY — DX: Cough, unspecified: R05.9

## 2014-01-11 HISTORY — DX: Postnasal drip: R09.82

## 2014-01-11 LAB — CBC WITH DIFFERENTIAL/PLATELET
BASO%: 0.4 % (ref 0.0–2.0)
Basophils Absolute: 0 10*3/uL (ref 0.0–0.1)
EOS ABS: 0.2 10*3/uL (ref 0.0–0.5)
EOS%: 3.1 % (ref 0.0–7.0)
HCT: 34.4 % — ABNORMAL LOW (ref 34.8–46.6)
HGB: 11 g/dL — ABNORMAL LOW (ref 11.6–15.9)
LYMPH%: 9.5 % — ABNORMAL LOW (ref 14.0–49.7)
MCH: 31.6 pg (ref 25.1–34.0)
MCHC: 31.9 g/dL (ref 31.5–36.0)
MCV: 99.2 fL (ref 79.5–101.0)
MONO#: 0.5 10*3/uL (ref 0.1–0.9)
MONO%: 8.4 % (ref 0.0–14.0)
NEUT#: 4.8 10*3/uL (ref 1.5–6.5)
NEUT%: 78.6 % — ABNORMAL HIGH (ref 38.4–76.8)
Platelets: 170 10*3/uL (ref 145–400)
RBC: 3.46 10*6/uL — AB (ref 3.70–5.45)
RDW: 16.3 % — AB (ref 11.2–14.5)
WBC: 6.1 10*3/uL (ref 3.9–10.3)
lymph#: 0.6 10*3/uL — ABNORMAL LOW (ref 0.9–3.3)

## 2014-01-11 LAB — COMPREHENSIVE METABOLIC PANEL (CC13)
ALBUMIN: 3.2 g/dL — AB (ref 3.5–5.0)
ALT: 30 U/L (ref 0–55)
ANION GAP: 13 meq/L — AB (ref 3–11)
AST: 24 U/L (ref 5–34)
Alkaline Phosphatase: 145 U/L (ref 40–150)
BUN: 15.2 mg/dL (ref 7.0–26.0)
CHLORIDE: 102 meq/L (ref 98–109)
CO2: 23 meq/L (ref 22–29)
Calcium: 9.5 mg/dL (ref 8.4–10.4)
Creatinine: 0.9 mg/dL (ref 0.6–1.1)
EGFR: 71 mL/min/{1.73_m2} — AB (ref >90)
GLUCOSE: 212 mg/dL — AB (ref 70–140)
POTASSIUM: 4.6 meq/L (ref 3.5–5.1)
SODIUM: 138 meq/L (ref 136–145)
Total Bilirubin: 0.43 mg/dL (ref 0.20–1.20)
Total Protein: 6.6 g/dL (ref 6.4–8.3)

## 2014-01-11 MED ORDER — AMOXICILLIN-POT CLAVULANATE 875-125 MG PO TABS: 1.0000 | ORAL_TABLET | Freq: Two times a day (BID) | ORAL | Status: DC

## 2014-01-11 MED ORDER — ONDANSETRON HCL 8 MG PO TABS
8.0000 mg | ORAL_TABLET | Freq: Once | ORAL | Status: AC
  Administered 2014-01-11: 8 mg via ORAL

## 2014-01-11 MED ORDER — BORTEZOMIB CHEMO SQ INJECTION 3.5 MG (2.5MG/ML)
1.3000 mg/m2 | Freq: Once | INTRAMUSCULAR | Status: AC
  Administered 2014-01-11: 2.75 mg via SUBCUTANEOUS
  Filled 2014-01-11: qty 2.75

## 2014-01-11 MED ORDER — TRIAMCINOLONE ACETONIDE 55 MCG/ACT NA AERO: 2.0000 | INHALATION_SPRAY | Freq: Every day | NASAL | Status: DC

## 2014-01-11 MED ORDER — ONDANSETRON HCL 8 MG PO TABS
ORAL_TABLET | ORAL | Status: AC
  Filled 2014-01-11: qty 1

## 2014-01-11 NOTE — Assessment & Plan Note (Signed)
She is not on intravenous bisphosphonate. I recommend dental clearance. I recommend vitamin D supplement. I will start her on Zometa once we have obtained dental clearance to treat her myeloma and osteoporosis.

## 2014-01-11 NOTE — Assessment & Plan Note (Signed)
She has significant elevated blood sugar but is not on treatment. I recommend she resume metformin.

## 2014-01-11 NOTE — Assessment & Plan Note (Signed)
I recommend she add calcium carbonate

## 2014-01-11 NOTE — Patient Instructions (Signed)
[facility] Discharge Instructions for Patients Receiving Chemotherapy  Today you received the following chemotherapy agents Velcade.  To help prevent nausea and vomiting after your treatment, we encourage you to take your nausea medication as directed.    If you develop nausea and vomiting that is not controlled by your nausea medication, call the clinic.   BELOW ARE SYMPTOMS THAT SHOULD BE REPORTED IMMEDIATELY:  *FEVER GREATER THAN 100.5 F  *CHILLS WITH OR WITHOUT FEVER  NAUSEA AND VOMITING THAT IS NOT CONTROLLED WITH YOUR NAUSEA MEDICATION  *UNUSUAL SHORTNESS OF BREATH  *UNUSUAL BRUISING OR BLEEDING  TENDERNESS IN MOUTH AND THROAT WITH OR WITHOUT PRESENCE OF ULCERS  *URINARY PROBLEMS  *BOWEL PROBLEMS  UNUSUAL RASH Items with * indicate a potential emergency and should be followed up as soon as possible.  Feel free to call the clinic you have any questions or concerns. The clinic phone number is [phone].

## 2014-01-11 NOTE — Assessment & Plan Note (Signed)
I gave her prescription Nasacort to try. I gave her prescription of antibiotic to hang onto, in case she get worse or if she starts to get upper respiratory tract infection.

## 2014-01-11 NOTE — Assessment & Plan Note (Signed)
Her repeat CT scan in October showed no evidence of disease.

## 2014-01-11 NOTE — Assessment & Plan Note (Signed)
I am 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 This is only mild, grade 1. Recommend close observation. She will continue Velcade every other week for 2 years per recommendation from the transplant physician.

## 2014-01-11 NOTE — Progress Notes (Signed)
[facility] FOLLOW-UP progress notes  Patient Care Team: Jonathon Bellows, MD as PCP - General (Family Medicine) Nobie Putnam, MD (Hematology and Oncology) Winfield Cunas., MD as Attending Physician (Gastroenterology) Jerrell Belfast, MD as Attending Physician (Otolaryngology) Collene Gobble, MD as Attending Physician (Pulmonary Disease)  CHIEF COMPLAINTS/PURPOSE OF VISIT:  Multiple myeloma status post transplant and carcinoid tumor status post resection, on Velcade  HISTORY OF PRESENTING ILLNESS:  [name] 68 y.o. female was transferred to my care after her prior physician has left.  I reviewed the patient's records extensive and collaborated the history with the patient. Summary of her history is as follows:   Multiple myeloma in remission   05/27/2012 Initial Diagnosis Multiple myeloma, without mention of having achieved remission(203.00)   05/27/2012 Cancer Staging 1.  IgG kappa multiple myeloma. She presented in 05/2012 with anemia, Cr 0.8, Ca 9.6; no bone lytic lesion; LDH 140; beta2- microglobulin 1.86 (ref 1.01-1.73). Positive SPEP for M-spike at 1.69, IgG 2080, kappa 7.24, kappa:lambda ratio 24.97; UPEP positive   06/08/2012 Bone Marrow Biopsy Bone marrow biopsy on 06/08/12 showed 19% plasma cell; normal cytogenetics; myeloma FISH pending.    06/14/2012 Imaging Skeletal survey negative for bone lytic lesions.  Lung nodule apparent.   09/17/2012 Bone Marrow Biopsy Bone marrow biopsy showed 11% plasma cells.   10/18/2012 - 12/31/2012 Chemotherapy Started on VRD (Velcade 1.3 mg/m^2 [place] days 1,8 and 15; Revlimide 25 mg PO daily, on day 1 through 14; Decadron 40 mg PO Days 1, 8, 15).   Started on coumadin given RD.   01/14/2013 Bone Marrow Biopsy The bone marrow clot sections are normocellular toslightly hypercellular for age (approximately 40% overall). 1% plasma cells.   02/08/2013 Bone Marrow Transplant Auto Transplant at [facility] (Dr. Glorianne Manchester McIver).   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    07/14/2012 Imaging 1. Right lower lobe nodule is non hypermetabolic which suggests benign etiology considering the slow growing rate in 6 years or non FDG avid disease as seen with carcinoid tumors. Pending biopsy. No PET evidence of mediastinal nodal disease.    08/02/2012 Imaging MRI of brain negative.   08/12/2012 Surgery Robotic Lobectomy of R Lower lobe.    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     Surgical resection margins are negative.     Two benign lymph nodes.     pTa1N0   Please see below for problem oriented charting. She is seen prior to her Velcade injection. She complained of postnasal drip and sore throat. Denies fevers or chills. Denies bone pain. She complained of tingling sensation on her skin. Denies side effects from Velcade injection.   MEDICAL HISTORY:  Past Medical History  Diagnosis Date  . Osteoporosis   . Hypercholesterolemia   . Esophageal reflux disease   . Hypertension   . Diabetes type 2, controlled   . Pancytopenia   . Ischemic colitis 2009  . Lung nodule 06/11/2012  . Depression   . Sleep apnea     UPPER AIRWAY RESISTANCE  DOES NOT HAVE CPAP , MAY NEED   .  Tuberculosis      (+ TB TEST)YEARS AGO POSSIBLE EXPOSURE WAS MED TX   . Multiple myeloma 05/2012    Cytogenetics on 06/08/2012 was normal 69, XX [20]  . Liver enzyme elevation   . Sepsis   . Pneumonia   . Post-nasal drip 01/11/2014  . Cough 01/11/2014    SURGICAL HISTORY: Past Surgical History  Procedure Laterality Date  . Colonoscopy  2013    poly; Dr. Oletta Lamas, next one due 2018.l  .  Appendectomy    . Tonsillectomy    . Leg surgery    . Video bronchoscopy with endobronchial navigation Right 07/08/2012    Procedure: VIDEO BRONCHOSCOPY WITH ENDOBRONCHIAL NAVIGATION;  Surgeon: Collene Gobble, MD;  Location: [facility];  Service: Thoracic;  Laterality: Right;    SOCIAL HISTORY: History   Social History  . Marital Status: Divorced    Spouse Name: N/A    Number of Children: 2  . Years of Education: N/A   Occupational History  . retired     adm. [employer]   Social History Main Topics  . Smoking status: Former Smoker -- 1.00 packs/day for 30 years    Types: Cigarettes    Quit date: 01/27/2009  . Smokeless tobacco: Never Used  . Alcohol Use: 0.0 oz/week     Comment: occasionally  . Drug Use: No  . Sexual Activity: Not on file   Other Topics Concern  . Not on file   Social History Narrative    FAMILY HISTORY: Family History  Problem Relation Age of Onset  . Heart attack Father   . Cancer Sister     synovial sarcoma  . Cancer Maternal Grandfather     ? cancer    ALLERGIES:  has No Known Allergies.  MEDICATIONS:  Current Outpatient Prescriptions  Medication Sig Dispense Refill  . acyclovir (ZOVIRAX) 400 MG tablet Take 1 tablet (400 mg total) by mouth 2 (two) times daily. 60 tablet 0  . buPROPion (WELLBUTRIN XL) 300 MG 24 hr tablet Take 300 mg by mouth daily.    . cholecalciferol (VITAMIN D) 1000 UNITS tablet Take 2,000 Units by mouth daily.    Marland Kitchen esomeprazole (NEXIUM) 40 MG capsule Take 1 capsule (40 mg total) by mouth 2 (two) times daily. 30 capsule 0  . losartan (COZAAR) 50 MG tablet Take 50 mg by mouth daily.    . magnesium chloride (SLOW-MAG) 64 MG TBEC SR tablet Take 1 tablet by mouth 2 (two) times daily.     . metoprolol succinate (TOPROL-XL) 50 MG 24 hr tablet Take 50 mg by mouth daily. Take with or immediately following a meal.    . Multiple Vitamins-Minerals (MULTIVITAMIN WITH MINERALS) tablet Take 1 tablet by mouth at bedtime.     . Multiple  Vitamins-Minerals (PRESERVISION AREDS 2) CAPS Take 1 capsule by mouth 2 (two) times daily.    . sertraline (ZOLOFT) 100 MG tablet Take 100 mg by mouth daily.     . simvastatin (ZOCOR) 40 MG tablet Take 40 mg by mouth at bedtime.     Marland Kitchen amoxicillin-clavulanate (AUGMENTIN) 875-125 MG per tablet Take 1 tablet by mouth 2 (two) times daily. 14 tablet 0  . triamcinolone (NASACORT) 55 MCG/ACT AERO nasal inhaler Place 2 sprays into the nose daily. 1 Inhaler 12   No current facility-administered medications for this visit.    REVIEW OF SYSTEMS:   Constitutional: Denies fevers, chills or abnormal night sweats Eyes: Denies blurriness of vision, double vision or watery eyes Respiratory:  Denies cough, dyspnea or wheezes Cardiovascular: Denies palpitation, chest discomfort or lower extremity swelling Gastrointestinal:  Denies nausea, heartburn or change in bowel habits Skin: Denies abnormal skin rashes Lymphatics: Denies new lymphadenopathy or easy bruising Behavioral/Psych: Mood is stable, no new changes  All other systems were reviewed with the patient and are negative.  PHYSICAL EXAMINATION: ECOG PERFORMANCE STATUS: 1 - Symptomatic but completely ambulatory  Filed Vitals:   01/11/14 1200  BP: 136/61  Pulse: 20  Temp: 98.6 F (37 C)  Resp: 20   Filed Weights   01/11/14 1200  Weight: 201 lb 9.6 oz (91.445 kg)    GENERAL:alert, no distress and comfortable. She is morbidly obese SKIN: skin color, texture, turgor are normal, no rashes or significant lesions EYES: normal, conjunctiva are pink and non-injected, sclera clear OROPHARYNX:no exudate, normal lips, buccal mucosa, and tongue  NECK: supple, thyroid normal size, non-tender, without nodularity LYMPH:  no palpable lymphadenopathy in the cervical, axillary or inguinal LUNGS: clear to auscultation and percussion with normal breathing effort HEART: regular rate & rhythm and no murmurs without lower extremity edema ABDOMEN:abdomen soft,  non-tender and normal bowel sounds Musculoskeletal:no cyanosis of digits and no clubbing  PSYCH: alert & oriented x 3 with fluent speech NEURO: no focal motor/sensory deficits  LABORATORY DATA:  I have reviewed the data as listed Lab Results  Component Value Date   WBC 6.1 01/11/2014   HGB 11.0* 01/11/2014   HCT 34.4* 01/11/2014   MCV 99.2 01/11/2014   PLT 170 01/11/2014    Recent Labs  10/07/13 0930 10/08/13 0927 10/10/13 0726  12/14/13 1027 12/28/13 1124 12/28/13 1223 01/11/14 1147  NA 135* 134* 143  < > 140 141 140 138  K 4.3 3.8 3.7  < > 4.5 4.3 4.3 4.6  CL 100 103 101  --   --   --   --   --   CO2 _0 < > 21* _1 GLUCOSE 95 135* 98  < > 184* 184* 184* 212*  BUN _2 < > 18.9 21.1 21.4 15.2  CREATININE 1.00 0.64 0.74  < > 0.8 0.8 0.9 0.9  CALCIUM 8.4 8.3* 9.1  < > 9.6 9.8 9.8 9.5  GFRNONAA 57* >90 87*  --   --   --   --   --   GFRAA 67* >90 >90  --   --   --   --   --   PROT  --  5.9*  --   < > 6.5  --  6.2* 6.6  ALBUMIN  --  2.2*  --   < > 3.7  --  3.4* 3.2*  AST  --  36  --   < > 40*  --  27 24  ALT  --  46*  --   < > 56*  --  40 30  ALKPHOS  --  178*  --   < > 107  --  111 145  BILITOT  --  0.4  --   < > 0.49  --  0.50 0.43  < > = values in this interval not displayed.  ASSESSMENT & PLAN:  Multiple myeloma in remission  I am 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 This is only mild, grade 1. Recommend close observation. She will continue Velcade every other week for 2 years per recommendation from the transplant physician.  Carcinoid tumor of lung  Her repeat CT scan in October showed no  evidence of disease.  Cough  I suspect this is due to postnasal drip. Clinically, she has no signs of pneumonia. I reassured the patient.  Diabetes  She has significant elevated blood sugar but is not on treatment. I recommend she resume metformin.  G E R D I recommend she add calcium carbonate  H/O autologous stem cell transplant She is due  for her post transplant vaccination next month at [facility] drip I gave her prescription Nasacort to try. I gave her prescription of antibiotic to hang onto, in case she get worse or if she starts to get upper respiratory tract infection.  Osteoporosis She is not on intravenous bisphosphonate. I recommend dental clearance. I recommend vitamin D supplement. I will start her on Zometa once we have obtained dental clearance to treat her myeloma and osteoporosis.   Orders Placed This Encounter  Procedures  . CBC with Differential    Standing Status: Future     Number of Occurrences:      Standing Expiration Date: 02/15/2015  . Comprehensive metabolic panel    Standing Status: Future     Number of Occurrences:      Standing Expiration Date: 02/15/2015    All questions were answered. The patient knows to call the clinic with any problems, questions or concerns. I spent 40 minutes counseling the patient face to face. The total time spent in the appointment was 60 minutes and more than 50% was on counseling.     [facility], [place], MD 01/11/2014 4:37 PM

## 2014-01-11 NOTE — Telephone Encounter (Signed)
lvm for pt regarding to [facility] and Jan appt....mailed pt appt sched/avs and letter

## 2014-01-11 NOTE — Assessment & Plan Note (Signed)
I suspect this is due to postnasal drip. Clinically, she has no signs of pneumonia. I reassured the patient.

## 2014-01-11 NOTE — Assessment & Plan Note (Signed)
She is due for her post transplant vaccination next month at [facility]

## 2014-01-13 LAB — SPEP & IFE WITH QIG
ALPHA-1-GLOBULIN: 7.6 % — AB (ref 2.9–4.9)
ALPHA-2-GLOBULIN: 16.3 % — AB (ref 7.1–11.8)
Albumin ELP: 54.3 % — ABNORMAL LOW (ref 55.8–66.1)
Beta 2: 6.3 % (ref 3.2–6.5)
Beta Globulin: 7.6 % — ABNORMAL HIGH (ref 4.7–7.2)
GAMMA GLOBULIN: 7.9 % — AB (ref 11.1–18.8)
IgA: 65 mg/dL — ABNORMAL LOW (ref 69–380)
IgG (Immunoglobin G), Serum: 453 mg/dL — ABNORMAL LOW (ref 690–1700)
IgM, Serum: 18 mg/dL — ABNORMAL LOW (ref 52–322)
Total Protein, Serum Electrophoresis: 6.5 g/dL (ref 6.0–8.3)

## 2014-01-13 LAB — KAPPA/LAMBDA LIGHT CHAINS
KAPPA FREE LGHT CHN: 0.03 mg/dL — AB (ref 0.33–1.94)
Kappa:Lambda Ratio: 0.19 — ABNORMAL LOW (ref 0.26–1.65)
Lambda Free Lght Chn: 0.16 mg/dL — ABNORMAL LOW (ref 0.57–2.63)

## 2014-01-25 ENCOUNTER — Other Ambulatory Visit (HOSPITAL_BASED_OUTPATIENT_CLINIC_OR_DEPARTMENT_OTHER): Payer: Medicare Other

## 2014-01-25 ENCOUNTER — Ambulatory Visit (HOSPITAL_BASED_OUTPATIENT_CLINIC_OR_DEPARTMENT_OTHER): Payer: Medicare Other

## 2014-01-25 DIAGNOSIS — C9000 Multiple myeloma not having achieved remission: Secondary | ICD-10-CM

## 2014-01-25 DIAGNOSIS — Z5112 Encounter for antineoplastic immunotherapy: Secondary | ICD-10-CM

## 2014-01-25 DIAGNOSIS — C9001 Multiple myeloma in remission: Secondary | ICD-10-CM

## 2014-01-25 LAB — CBC WITH DIFFERENTIAL/PLATELET
BASO%: 0.7 % (ref 0.0–2.0)
BASOS ABS: 0 10*3/uL (ref 0.0–0.1)
EOS ABS: 0.3 10*3/uL (ref 0.0–0.5)
EOS%: 5.6 % (ref 0.0–7.0)
HCT: 32.7 % — ABNORMAL LOW (ref 34.8–46.6)
HGB: 10.4 g/dL — ABNORMAL LOW (ref 11.6–15.9)
LYMPH%: 15.7 % (ref 14.0–49.7)
MCH: 31.6 pg (ref 25.1–34.0)
MCHC: 31.9 g/dL (ref 31.5–36.0)
MCV: 99 fL (ref 79.5–101.0)
MONO#: 0.4 10*3/uL (ref 0.1–0.9)
MONO%: 8.1 % (ref 0.0–14.0)
NEUT%: 69.9 % (ref 38.4–76.8)
NEUTROS ABS: 3.4 10*3/uL (ref 1.5–6.5)
Platelets: 183 10*3/uL (ref 145–400)
RBC: 3.31 10*6/uL — AB (ref 3.70–5.45)
RDW: 15 % — ABNORMAL HIGH (ref 11.2–14.5)
WBC: 4.8 10*3/uL (ref 3.9–10.3)
lymph#: 0.8 10*3/uL — ABNORMAL LOW (ref 0.9–3.3)

## 2014-01-25 LAB — COMPREHENSIVE METABOLIC PANEL (CC13)
ALK PHOS: 125 U/L (ref 40–150)
ALT: 35 U/L (ref 0–55)
AST: 27 U/L (ref 5–34)
Albumin: 3.3 g/dL — ABNORMAL LOW (ref 3.5–5.0)
Anion Gap: 10 mEq/L (ref 3–11)
BUN: 12.7 mg/dL (ref 7.0–26.0)
CO2: 26 mEq/L (ref 22–29)
Calcium: 9.7 mg/dL (ref 8.4–10.4)
Chloride: 106 mEq/L (ref 98–109)
Creatinine: 0.8 mg/dL (ref 0.6–1.1)
EGFR: 79 mL/min/{1.73_m2} — ABNORMAL LOW (ref >90)
Glucose: 130 mg/dl (ref 70–140)
POTASSIUM: 4.7 meq/L (ref 3.5–5.1)
SODIUM: 142 meq/L (ref 136–145)
TOTAL PROTEIN: 6.7 g/dL (ref 6.4–8.3)
Total Bilirubin: 0.45 mg/dL (ref 0.20–1.20)

## 2014-01-25 MED ORDER — BORTEZOMIB CHEMO SQ INJECTION 3.5 MG (2.5MG/ML)
1.3000 mg/m2 | Freq: Once | INTRAMUSCULAR | Status: AC
  Administered 2014-01-25: 2.75 mg via SUBCUTANEOUS
  Filled 2014-01-25: qty 2.75

## 2014-01-25 MED ORDER — ONDANSETRON HCL 8 MG PO TABS
ORAL_TABLET | ORAL | Status: AC
  Filled 2014-01-25: qty 1

## 2014-01-25 MED ORDER — ONDANSETRON HCL 8 MG PO TABS
8.0000 mg | ORAL_TABLET | Freq: Once | ORAL | Status: AC
  Administered 2014-01-25: 8 mg via ORAL

## 2014-01-25 NOTE — Patient Instructions (Signed)
[facility] Discharge Instructions for Patients Receiving Chemotherapy  Today you received the following chemotherapy agents Velcade.  To help prevent nausea and vomiting after your treatment, we encourage you to take your nausea medication as directed.    If you develop nausea and vomiting that is not controlled by your nausea medication, call the clinic.   BELOW ARE SYMPTOMS THAT SHOULD BE REPORTED IMMEDIATELY:  *FEVER GREATER THAN 100.5 F  *CHILLS WITH OR WITHOUT FEVER  NAUSEA AND VOMITING THAT IS NOT CONTROLLED WITH YOUR NAUSEA MEDICATION  *UNUSUAL SHORTNESS OF BREATH  *UNUSUAL BRUISING OR BLEEDING  TENDERNESS IN MOUTH AND THROAT WITH OR WITHOUT PRESENCE OF ULCERS  *URINARY PROBLEMS  *BOWEL PROBLEMS  UNUSUAL RASH Items with * indicate a potential emergency and should be followed up as soon as possible.  Feel free to call the clinic you have any questions or concerns. The clinic phone number is [phone].

## 2014-01-31 ENCOUNTER — Encounter: Payer: Self-pay | Admitting: Hematology and Oncology

## 2014-01-31 ENCOUNTER — Other Ambulatory Visit: Payer: Self-pay | Admitting: Hematology and Oncology

## 2014-01-31 ENCOUNTER — Telehealth: Payer: Self-pay | Admitting: *Deleted

## 2014-01-31 DIAGNOSIS — E119 Type 2 diabetes mellitus without complications: Secondary | ICD-10-CM | POA: Insufficient documentation

## 2014-01-31 HISTORY — DX: Type 2 diabetes mellitus without complications: E11.9

## 2014-01-31 MED ORDER — METFORMIN HCL 500 MG PO TABS: 500.0000 mg | ORAL_TABLET | Freq: Two times a day (BID) | ORAL | Status: DC

## 2014-01-31 NOTE — Telephone Encounter (Signed)
Pt left VM states Dr. Alvy Bimler instructed her to resume her Metformin on last visit.  The metformin she has is expired and she asks if Dr. Alvy Bimler will send rx to her pharmacy, [facility] on [address]? She also wants to clarify the dose, is it 500 mg twice daily?

## 2014-01-31 NOTE — Telephone Encounter (Signed)
Done. BID sent to pharmacy

## 2014-01-31 NOTE — Telephone Encounter (Signed)
Left VM for pt informing of Rx sent to [facility] for metformin 500 mg twice daily.

## 2014-02-07 ENCOUNTER — Other Ambulatory Visit: Payer: Self-pay | Admitting: Hematology and Oncology

## 2014-02-07 DIAGNOSIS — C9001 Multiple myeloma in remission: Secondary | ICD-10-CM

## 2014-02-08 ENCOUNTER — Other Ambulatory Visit (HOSPITAL_BASED_OUTPATIENT_CLINIC_OR_DEPARTMENT_OTHER): Payer: Medicare Other

## 2014-02-08 ENCOUNTER — Ambulatory Visit (HOSPITAL_BASED_OUTPATIENT_CLINIC_OR_DEPARTMENT_OTHER): Payer: Medicare Other

## 2014-02-08 ENCOUNTER — Encounter: Payer: Self-pay | Admitting: Hematology and Oncology

## 2014-02-08 ENCOUNTER — Ambulatory Visit (HOSPITAL_BASED_OUTPATIENT_CLINIC_OR_DEPARTMENT_OTHER): Payer: Medicare Other | Admitting: Hematology and Oncology

## 2014-02-08 VITALS — BP 154/58 | HR 92 | Temp 98.2°F | Resp 18 | Ht 65.0 in | Wt 197.8 lb

## 2014-02-08 DIAGNOSIS — K219 Gastro-esophageal reflux disease without esophagitis: Secondary | ICD-10-CM

## 2014-02-08 DIAGNOSIS — C9001 Multiple myeloma in remission: Secondary | ICD-10-CM | POA: Diagnosis not present

## 2014-02-08 DIAGNOSIS — D630 Anemia in neoplastic disease: Secondary | ICD-10-CM | POA: Diagnosis not present

## 2014-02-08 DIAGNOSIS — Z5112 Encounter for antineoplastic immunotherapy: Secondary | ICD-10-CM

## 2014-02-08 DIAGNOSIS — R5383 Other fatigue: Secondary | ICD-10-CM

## 2014-02-08 HISTORY — DX: Other fatigue: R53.83

## 2014-02-08 LAB — COMPREHENSIVE METABOLIC PANEL (CC13)
ALBUMIN: 3.5 g/dL (ref 3.5–5.0)
ALK PHOS: 126 U/L (ref 40–150)
ALT: 27 U/L (ref 0–55)
AST: 26 U/L (ref 5–34)
Anion Gap: 13 mEq/L — ABNORMAL HIGH (ref 3–11)
BUN: 13.8 mg/dL (ref 7.0–26.0)
CHLORIDE: 106 meq/L (ref 98–109)
CO2: 24 mEq/L (ref 22–29)
Calcium: 9.5 mg/dL (ref 8.4–10.4)
Creatinine: 0.8 mg/dL (ref 0.6–1.1)
EGFR: 76 mL/min/{1.73_m2} — ABNORMAL LOW (ref >90)
GLUCOSE: 146 mg/dL — AB (ref 70–140)
Potassium: 4.3 mEq/L (ref 3.5–5.1)
SODIUM: 143 meq/L (ref 136–145)
TOTAL PROTEIN: 6.8 g/dL (ref 6.4–8.3)
Total Bilirubin: 0.44 mg/dL (ref 0.20–1.20)

## 2014-02-08 LAB — CBC WITH DIFFERENTIAL/PLATELET
BASO%: 0.4 % (ref 0.0–2.0)
Basophils Absolute: 0 10*3/uL (ref 0.0–0.1)
EOS%: 4.5 % (ref 0.0–7.0)
Eosinophils Absolute: 0.2 10*3/uL (ref 0.0–0.5)
HCT: 33.9 % — ABNORMAL LOW (ref 34.8–46.6)
HEMOGLOBIN: 11 g/dL — AB (ref 11.6–15.9)
LYMPH%: 14.6 % (ref 14.0–49.7)
MCH: 31.6 pg (ref 25.1–34.0)
MCHC: 32.4 g/dL (ref 31.5–36.0)
MCV: 97.4 fL (ref 79.5–101.0)
MONO#: 0.4 10*3/uL (ref 0.1–0.9)
MONO%: 8 % (ref 0.0–14.0)
NEUT#: 3.5 10*3/uL (ref 1.5–6.5)
NEUT%: 72.5 % (ref 38.4–76.8)
Platelets: 181 10*3/uL (ref 145–400)
RBC: 3.48 10*6/uL — AB (ref 3.70–5.45)
RDW: 14.7 % — AB (ref 11.2–14.5)
WBC: 4.9 10*3/uL (ref 3.9–10.3)
lymph#: 0.7 10*3/uL — ABNORMAL LOW (ref 0.9–3.3)

## 2014-02-08 MED ORDER — ONDANSETRON HCL 8 MG PO TABS
ORAL_TABLET | ORAL | Status: AC
  Filled 2014-02-08: qty 1

## 2014-02-08 MED ORDER — ONDANSETRON HCL 8 MG PO TABS
8.0000 mg | ORAL_TABLET | Freq: Once | ORAL | Status: AC
  Administered 2014-02-08: 8 mg via ORAL

## 2014-02-08 MED ORDER — BORTEZOMIB CHEMO SQ INJECTION 3.5 MG (2.5MG/ML)
1.3000 mg/m2 | Freq: Once | INTRAMUSCULAR | Status: AC
  Administered 2014-02-08: 2.75 mg via SUBCUTANEOUS
  Filled 2014-02-08: qty 2.75

## 2014-02-08 NOTE — Patient Instructions (Signed)
[facility] Discharge Instructions for Patients Receiving Chemotherapy  Today you received the following chemotherapy agents velcade   To help prevent nausea and vomiting after your treatment, we encourage you to take your nausea medication as directed  If you develop nausea and vomiting that is not controlled by your nausea medication, call the clinic.   BELOW ARE SYMPTOMS THAT SHOULD BE REPORTED IMMEDIATELY:  *FEVER GREATER THAN 100.5 F  *CHILLS WITH OR WITHOUT FEVER  NAUSEA AND VOMITING THAT IS NOT CONTROLLED WITH YOUR NAUSEA MEDICATION  *UNUSUAL SHORTNESS OF BREATH  *UNUSUAL BRUISING OR BLEEDING  TENDERNESS IN MOUTH AND THROAT WITH OR WITHOUT PRESENCE OF ULCERS  *URINARY PROBLEMS  *BOWEL PROBLEMS  UNUSUAL RASH Items with * indicate a potential emergency and should be followed up as soon as possible.  Feel free to call the clinic you have any questions or concerns. The clinic phone number is [phone].  

## 2014-02-09 ENCOUNTER — Encounter: Payer: Self-pay | Admitting: Hematology and Oncology

## 2014-02-09 DIAGNOSIS — D630 Anemia in neoplastic disease: Secondary | ICD-10-CM | POA: Insufficient documentation

## 2014-02-09 NOTE — Assessment & Plan Note (Signed)
I am 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 This is only mild, grade 1. Recommend close observation. She will continue Velcade every other week for 2 years per recommendation from the transplant physician.

## 2014-02-09 NOTE — Assessment & Plan Note (Signed)
This is likely anemia of chronic disease. The patient denies recent history of bleeding such as epistaxis, hematuria or hematochezia. She is asymptomatic from the anemia. We will observe for now.  She does not require transfusion now.   

## 2014-02-09 NOTE — Progress Notes (Signed)
[place] OFFICE PROGRESS NOTE  Patient Care Team: Jonathon Bellows, MD as PCP - General (Family Medicine) Nobie Putnam, MD (Hematology and Oncology) Winfield Cunas., MD as Attending Physician (Gastroenterology) Jerrell Belfast, MD as Attending Physician (Otolaryngology) Collene Gobble, MD as Attending Physician (Pulmonary Disease)  SUMMARY OF ONCOLOGIC HISTORY:   Multiple myeloma in remission   05/27/2012 Initial Diagnosis Multiple myeloma, without mention of having achieved remission(203.00)   05/27/2012 Cancer Staging 1.  IgG kappa multiple myeloma. She presented in 05/2012 with anemia, Cr 0.8, Ca 9.6; no bone lytic lesion; LDH 140; beta2- microglobulin 1.86 (ref 1.01-1.73). Positive SPEP for M-spike at 1.69, IgG 2080, kappa 7.24, kappa:lambda ratio 24.97; UPEP positive   06/08/2012 Bone Marrow Biopsy Bone marrow biopsy on 06/08/12 showed 19% plasma cell; normal cytogenetics; myeloma FISH pending.    06/14/2012 Imaging Skeletal survey negative for bone lytic lesions.  Lung nodule apparent.   09/17/2012 Bone Marrow Biopsy Bone marrow biopsy showed 11% plasma cells.   10/18/2012 - 12/31/2012 Chemotherapy Started on VRD (Velcade 1.3 mg/m^2 [place] days 1,8 and 15; Revlimide 25 mg PO daily, on day 1 through 14; Decadron 40 mg PO Days 1, 8, 15).   Started on coumadin given RD.   01/14/2013 Bone Marrow Biopsy The bone marrow clot sections are normocellular toslightly hypercellular for age (approximately 40% overall). 1% plasma cells.   02/08/2013 Bone Marrow Transplant Auto Transplant at [facility] (Dr. Glorianne Manchester McIver).   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    07/14/2012 Imaging 1. Right lower  lobe nodule is non hypermetabolic which suggests benign etiology considering the slow growing rate in 6 years or non FDG avid disease as seen with carcinoid tumors. Pending biopsy. No PET evidence of mediastinal nodal disease.    08/02/2012 Imaging MRI of brain negative.   08/12/2012 Surgery Robotic Lobectomy of R Lower lobe.    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     Surgical resection margins are negative.     Two benign lymph nodes.     pTa1N0    INTERVAL HISTORY: Please see below for problem oriented charting. She is in prior to chemotherapy. She complained of occasional heartburn, well controlled right now. She denies any new bone pain. No recent infection.  REVIEW OF SYSTEMS:   Constitutional: Denies fevers, chills or abnormal weight loss Eyes: Denies blurriness of vision Ears, nose, mouth, throat, and face: Denies mucositis or sore throat Respiratory: Denies cough, dyspnea or wheezes Cardiovascular: Denies palpitation, chest discomfort or lower extremity swelling Gastrointestinal:  Denies nausea, heartburn or change in bowel habits Skin: Denies abnormal skin rashes Lymphatics: Denies new lymphadenopathy or easy bruising Neurological:Denies numbness, tingling or new weaknesses Behavioral/Psych: Mood is stable, no new changes  All other systems were reviewed with the patient and are negative.  I have reviewed the past medical history, past surgical history, social history and family history with the patient and they are unchanged from previous note.  ALLERGIES:  has No Known Allergies.  MEDICATIONS:  Current Outpatient Prescriptions  Medication Sig Dispense Refill  . acyclovir (ZOVIRAX) 400 MG tablet Take  1 tablet (400 mg total) by mouth 2 (two) times daily. 60 tablet 0  . buPROPion (WELLBUTRIN XL) 300 MG 24 hr tablet Take 300 mg by mouth daily.    . cholecalciferol (VITAMIN D) 1000 UNITS  tablet Take 2,000 Units by mouth daily.    Marland Kitchen esomeprazole (NEXIUM) 40 MG capsule Take 1 capsule (40 mg total) by mouth 2 (two) times daily. (Patient taking differently: Take 40 mg by mouth daily. ) 30 capsule 0  . losartan (COZAAR) 50 MG tablet Take 50 mg by mouth daily.    . magnesium chloride (SLOW-MAG) 64 MG TBEC SR tablet Take 1 tablet by mouth 2 (two) times daily.     . metFORMIN (GLUCOPHAGE) 500 MG tablet Take 1 tablet (500 mg total) by mouth 2 (two) times daily with a meal. 60 tablet 1  . metoprolol succinate (TOPROL-XL) 50 MG 24 hr tablet Take 50 mg by mouth daily. Take with or immediately following a meal.    . Multiple Vitamins-Minerals (MULTIVITAMIN WITH MINERALS) tablet Take 1 tablet by mouth at bedtime.     . Multiple Vitamins-Minerals (PRESERVISION AREDS 2) CAPS Take 1 capsule by mouth 2 (two) times daily.    . sertraline (ZOLOFT) 100 MG tablet Take 100 mg by mouth daily.     . simvastatin (ZOCOR) 40 MG tablet Take 40 mg by mouth at bedtime.     . triamcinolone (NASACORT) 55 MCG/ACT AERO nasal inhaler Place 2 sprays into the nose daily. 1 Inhaler 12   No current facility-administered medications for this visit.    PHYSICAL EXAMINATION: ECOG PERFORMANCE STATUS: 0 - Asymptomatic  Filed Vitals:   02/08/14 1133  BP: 154/58  Pulse: 92  Temp: 98.2 F (36.8 C)  Resp: 18   Filed Weights   02/08/14 1133  Weight: 197 lb 12.8 oz (89.721 kg)    GENERAL:alert, no distress and comfortable SKIN: skin color, texture, turgor are normal, no rashes or significant lesions EYES: normal, Conjunctiva are pink and non-injected, sclera clear OROPHARYNX:no exudate, no erythema and lips, buccal mucosa, and tongue normal  NECK: supple, thyroid normal size, non-tender, without nodularity LYMPH:  no palpable lymphadenopathy in the cervical, axillary or inguinal LUNGS: clear to auscultation and percussion with normal breathing effort HEART: regular rate & rhythm and no murmurs and no lower  extremity edema ABDOMEN:abdomen soft, non-tender and normal bowel sounds Musculoskeletal:no cyanosis of digits and no clubbing  NEURO: alert & oriented x 3 with fluent speech, no focal motor/sensory deficits  LABORATORY DATA:  I have reviewed the data as listed    Component Value Date/Time   NA 143 02/08/2014 1116   NA 143 10/10/2013 0726   K 4.3 02/08/2014 1116   K 3.7 10/10/2013 0726   CL 101 10/10/2013 0726   CL 105 06/08/2012 0913   CO2 24 02/08/2014 1116   CO2 29 10/10/2013 0726   GLUCOSE 146* 02/08/2014 1116   GLUCOSE 98 10/10/2013 0726   GLUCOSE 120* 06/08/2012 0913   BUN 13.8 02/08/2014 1116   BUN 11 10/10/2013 0726   CREATININE 0.8 02/08/2014 1116   CREATININE 0.74 10/10/2013 0726   CALCIUM 9.5 02/08/2014 1116   CALCIUM 9.1 10/10/2013 0726   PROT 6.8 02/08/2014 1116   PROT 5.9* 10/08/2013 0927   ALBUMIN 3.5 02/08/2014 1116   ALBUMIN 2.2* 10/08/2013 0927   AST 26 02/08/2014 1116   AST 36 10/08/2013 0927   ALT 27 02/08/2014 1116   ALT 46* 10/08/2013 0927   ALKPHOS 126 02/08/2014 1116  ALKPHOS 178* 10/08/2013 0927   BILITOT 0.44 02/08/2014 1116   BILITOT 0.4 10/08/2013 0927   GFRNONAA 87* 10/10/2013 0726   GFRAA >90 10/10/2013 0726    No results found for: SPEP, UPEP  Lab Results  Component Value Date   WBC 4.9 02/08/2014   NEUTROABS 3.5 02/08/2014   HGB 11.0* 02/08/2014   HCT 33.9* 02/08/2014   MCV 97.4 02/08/2014   PLT 181 02/08/2014      Chemistry      Component Value Date/Time   NA 143 02/08/2014 1116   NA 143 10/10/2013 0726   K 4.3 02/08/2014 1116   K 3.7 10/10/2013 0726   CL 101 10/10/2013 0726   CL 105 06/08/2012 0913   CO2 24 02/08/2014 1116   CO2 29 10/10/2013 0726   BUN 13.8 02/08/2014 1116   BUN 11 10/10/2013 0726   CREATININE 0.8 02/08/2014 1116   CREATININE 0.74 10/10/2013 0726      Component Value Date/Time   CALCIUM 9.5 02/08/2014 1116   CALCIUM 9.1 10/10/2013 0726   ALKPHOS 126 02/08/2014 1116   ALKPHOS 178* 10/08/2013  0927   AST 26 02/08/2014 1116   AST 36 10/08/2013 0927   ALT 27 02/08/2014 1116   ALT 46* 10/08/2013 0927   BILITOT 0.44 02/08/2014 1116   BILITOT 0.4 10/08/2013 0927      ASSESSMENT & PLAN:  Multiple myeloma in remission  I am 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 This is only mild, grade 1. Recommend close observation. She will continue Velcade every other week for 2 years per recommendation from the transplant physician.   Acid reflux She is taking a proton pump inhibitor. Clinically, this is stable.   Anemia in neoplastic disease This is likely anemia of chronic disease. The patient denies recent history of bleeding such as epistaxis, hematuria or hematochezia. She is asymptomatic from the anemia. We will observe for now.  She does not require transfusion now.      Orders Placed This Encounter  Procedures  . T4, free    Standing Status: Future     Number of Occurrences:      Standing Expiration Date: 03/15/2015  . TSH    Standing Status: Future     Number of Occurrences:      Standing Expiration Date: 03/15/2015   All questions were answered. The patient knows to call the clinic with any problems, questions or concerns. No barriers to learning was detected. I spent 25 minutes counseling the patient face to face. The total time spent in the appointment was 30 minutes and more than 50% was on counseling and review of test results     Jessica Augusta, MD '@T' (<PARAMETER> error)@ 1:06 PM

## 2014-02-09 NOTE — Assessment & Plan Note (Signed)
She is taking a proton pump inhibitor. Clinically, this is stable.

## 2014-02-10 ENCOUNTER — Telehealth: Payer: Self-pay | Admitting: *Deleted

## 2014-02-10 ENCOUNTER — Telehealth: Payer: Self-pay | Admitting: Hematology and Oncology

## 2014-02-10 ENCOUNTER — Other Ambulatory Visit: Payer: Self-pay | Admitting: Family Medicine

## 2014-02-10 ENCOUNTER — Ambulatory Visit
Admission: RE | Admit: 2014-02-10 | Discharge: 2014-02-10 | Disposition: A | Discharge status: Home / Self Care | Payer: Medicare Other | Source: Ambulatory Visit | Attending: Family Medicine | Admitting: Family Medicine

## 2014-02-10 DIAGNOSIS — J929 Pleural plaque without asbestos: Secondary | ICD-10-CM | POA: Diagnosis not present

## 2014-02-10 DIAGNOSIS — R0600 Dyspnea, unspecified: Secondary | ICD-10-CM

## 2014-02-10 DIAGNOSIS — I517 Cardiomegaly: Secondary | ICD-10-CM | POA: Diagnosis not present

## 2014-02-10 DIAGNOSIS — R109 Unspecified abdominal pain: Secondary | ICD-10-CM | POA: Diagnosis not present

## 2014-02-10 DIAGNOSIS — J986 Disorders of diaphragm: Secondary | ICD-10-CM | POA: Diagnosis not present

## 2014-02-10 NOTE — Telephone Encounter (Signed)
Per staff message and POF I have scheduled appts. Advised scheduler of appts. JMW  

## 2014-02-10 NOTE — Telephone Encounter (Signed)
LM to confirm appt. Mailed cal.

## 2014-02-14 DIAGNOSIS — Z9484 Stem cells transplant status: Secondary | ICD-10-CM | POA: Diagnosis not present

## 2014-02-14 DIAGNOSIS — C9000 Multiple myeloma not having achieved remission: Secondary | ICD-10-CM | POA: Diagnosis not present

## 2014-02-14 DIAGNOSIS — Z23 Encounter for immunization: Secondary | ICD-10-CM | POA: Diagnosis not present

## 2014-02-21 ENCOUNTER — Telehealth: Payer: Self-pay | Admitting: *Deleted

## 2014-02-21 NOTE — Telephone Encounter (Signed)
Patient called asking if we can see results of lab work at [facility] on 02-14-2014 when she saw Dr. Harvel Ricks.  No light chains results noted in care everywhere by this nurse at this time.  [name] will print report and bring to [facility] tomorrow.

## 2014-02-22 ENCOUNTER — Telehealth: Payer: Self-pay | Admitting: Hematology and Oncology

## 2014-02-22 ENCOUNTER — Ambulatory Visit: Payer: Medicare Other

## 2014-02-22 ENCOUNTER — Other Ambulatory Visit: Payer: Medicare Other

## 2014-02-22 NOTE — Telephone Encounter (Signed)
pt called to r/s appt due to being sick...done....pt aware of new d.t

## 2014-02-23 ENCOUNTER — Ambulatory Visit (HOSPITAL_BASED_OUTPATIENT_CLINIC_OR_DEPARTMENT_OTHER): Payer: Medicare Other

## 2014-02-23 ENCOUNTER — Other Ambulatory Visit (HOSPITAL_BASED_OUTPATIENT_CLINIC_OR_DEPARTMENT_OTHER): Payer: Medicare Other

## 2014-02-23 DIAGNOSIS — R5383 Other fatigue: Secondary | ICD-10-CM | POA: Diagnosis not present

## 2014-02-23 DIAGNOSIS — C9001 Multiple myeloma in remission: Secondary | ICD-10-CM | POA: Diagnosis not present

## 2014-02-23 DIAGNOSIS — Z5112 Encounter for antineoplastic immunotherapy: Secondary | ICD-10-CM

## 2014-02-23 DIAGNOSIS — I10 Essential (primary) hypertension: Secondary | ICD-10-CM | POA: Diagnosis not present

## 2014-02-23 LAB — COMPREHENSIVE METABOLIC PANEL (CC13)
ALK PHOS: 106 U/L (ref 40–150)
ALT: 20 U/L (ref 0–55)
ANION GAP: 13 meq/L — AB (ref 3–11)
AST: 16 U/L (ref 5–34)
Albumin: 3.2 g/dL — ABNORMAL LOW (ref 3.5–5.0)
BUN: 15.4 mg/dL (ref 7.0–26.0)
CHLORIDE: 107 meq/L (ref 98–109)
CO2: 21 meq/L — AB (ref 22–29)
CREATININE: 0.7 mg/dL (ref 0.6–1.1)
Calcium: 9.3 mg/dL (ref 8.4–10.4)
EGFR: 85 mL/min/{1.73_m2} — ABNORMAL LOW (ref >90)
Glucose: 182 mg/dl — ABNORMAL HIGH (ref 70–140)
POTASSIUM: 4.3 meq/L (ref 3.5–5.1)
Sodium: 141 mEq/L (ref 136–145)
TOTAL PROTEIN: 6.2 g/dL — AB (ref 6.4–8.3)
Total Bilirubin: 0.39 mg/dL (ref 0.20–1.20)

## 2014-02-23 LAB — CBC WITH DIFFERENTIAL/PLATELET
BASO%: 1.2 % (ref 0.0–2.0)
Basophils Absolute: 0 10*3/uL (ref 0.0–0.1)
EOS ABS: 0.4 10*3/uL (ref 0.0–0.5)
EOS%: 10.2 % — ABNORMAL HIGH (ref 0.0–7.0)
HCT: 33.1 % — ABNORMAL LOW (ref 34.8–46.6)
HEMOGLOBIN: 10.5 g/dL — AB (ref 11.6–15.9)
LYMPH#: 0.6 10*3/uL — AB (ref 0.9–3.3)
LYMPH%: 15.6 % (ref 14.0–49.7)
MCH: 31.3 pg (ref 25.1–34.0)
MCHC: 31.8 g/dL (ref 31.5–36.0)
MCV: 98.4 fL (ref 79.5–101.0)
MONO#: 0.3 10*3/uL (ref 0.1–0.9)
MONO%: 8.1 % (ref 0.0–14.0)
NEUT#: 2.6 10*3/uL (ref 1.5–6.5)
NEUT%: 64.9 % (ref 38.4–76.8)
PLATELETS: 186 10*3/uL (ref 145–400)
RBC: 3.37 10*6/uL — ABNORMAL LOW (ref 3.70–5.45)
RDW: 15.6 % — ABNORMAL HIGH (ref 11.2–14.5)
WBC: 4 10*3/uL (ref 3.9–10.3)

## 2014-02-23 LAB — T4, FREE: Free T4: 0.94 ng/dL (ref 0.80–1.80)

## 2014-02-23 LAB — TSH CHCC: TSH: 2.505 m(IU)/L (ref 0.308–3.960)

## 2014-02-23 MED ORDER — ZOLEDRONIC ACID 4 MG/100ML IV SOLN
4.0000 mg | Freq: Once | INTRAVENOUS | Status: AC
  Administered 2014-02-23: 4 mg via INTRAVENOUS
  Filled 2014-02-23: qty 100

## 2014-02-23 MED ORDER — ONDANSETRON HCL 8 MG PO TABS
ORAL_TABLET | ORAL | Status: AC
  Filled 2014-02-23: qty 1

## 2014-02-23 MED ORDER — BORTEZOMIB CHEMO SQ INJECTION 3.5 MG (2.5MG/ML)
1.3000 mg/m2 | Freq: Once | INTRAMUSCULAR | Status: AC
Start: 2014-02-23 — End: 2014-02-23
  Administered 2014-02-23: 2.75 mg via SUBCUTANEOUS
  Filled 2014-02-23: qty 2.75

## 2014-02-23 MED ORDER — SODIUM CHLORIDE 0.9 % IV SOLN
Freq: Once | INTRAVENOUS | Status: AC
  Administered 2014-02-23: 15:00:00 via INTRAVENOUS

## 2014-02-23 MED ORDER — ONDANSETRON HCL 8 MG PO TABS
8.0000 mg | ORAL_TABLET | Freq: Once | ORAL | Status: AC
  Administered 2014-02-23: 8 mg via ORAL

## 2014-02-23 NOTE — Patient Instructions (Signed)
[place] Discharge Instructions for Patients Receiving Chemotherapy  Today you received the following chemotherapy agents: Velcade.  To help prevent nausea and vomiting after your treatment, we encourage you to take your nausea medication as prescribed.   If you develop nausea and vomiting that is not controlled by your nausea medication, call the clinic.   BELOW ARE SYMPTOMS THAT SHOULD BE REPORTED IMMEDIATELY:  *FEVER GREATER THAN 100.5 F  *CHILLS WITH OR WITHOUT FEVER  NAUSEA AND VOMITING THAT IS NOT CONTROLLED WITH YOUR NAUSEA MEDICATION  *UNUSUAL SHORTNESS OF BREATH  *UNUSUAL BRUISING OR BLEEDING  TENDERNESS IN MOUTH AND THROAT WITH OR WITHOUT PRESENCE OF ULCERS  *URINARY PROBLEMS  *BOWEL PROBLEMS  UNUSUAL RASH Items with * indicate a potential emergency and should be followed up as soon as possible.  Feel free to call the clinic you have any questions or concerns. The clinic phone number is [phone].   Zoledronic Acid injection (Hypercalcemia, Oncology) What is this medicine? ZOLEDRONIC ACID (ZOE le dron ik AS id) lowers the amount of calcium loss from bone. It is used to treat too much calcium in your blood from cancer. It is also used to prevent complications of cancer that has spread to the bone. This medicine may be used for other purposes; ask your health care provider or pharmacist if you have questions. COMMON BRAND NAME(S): Zometa What should I tell my health care provider before I take this medicine? They need to know if you have any of these conditions: -aspirin-sensitive asthma -cancer, especially if you are receiving medicines used to treat cancer -dental disease or wear dentures -infection -kidney disease -receiving corticosteroids like dexamethasone or prednisone -an unusual or allergic reaction to zoledronic acid, other medicines, foods, dyes, or preservatives -pregnant or trying to get pregnant -breast-feeding How should I  use this medicine? This medicine is for infusion into a vein. It is given by a health care professional in a hospital or clinic setting. Talk to your pediatrician regarding the use of this medicine in children. Special care may be needed. Overdosage: If you think you have taken too much of this medicine contact a poison control center or emergency room at once. NOTE: This medicine is only for you. Do not share this medicine with others. What if I miss a dose? It is important not to miss your dose. Call your doctor or health care professional if you are unable to keep an appointment. What may interact with this medicine? -certain antibiotics given by injection -NSAIDs, medicines for pain and inflammation, like ibuprofen or naproxen -some diuretics like bumetanide, furosemide -teriparatide -thalidomide This list may not describe all possible interactions. Give your health care provider a list of all the medicines, herbs, non-prescription drugs, or dietary supplements you use. Also tell them if you smoke, drink alcohol, or use illegal drugs. Some items may interact with your medicine. What should I watch for while using this medicine? Visit your doctor or health care professional for regular checkups. It may be some time before you see the benefit from this medicine. Do not stop taking your medicine unless your doctor tells you to. Your doctor may order blood tests or other tests to see how you are doing. Women should inform their doctor if they wish to become pregnant or think they might be pregnant. There is a potential for serious side effects to an unborn child. Talk to your health care professional or pharmacist for more information. You should make sure that you get  enough calcium and vitamin D while you are taking this medicine. Discuss the foods you eat and the vitamins you take with your health care professional. Some people who take this medicine have severe bone, joint, and/or muscle pain.  This medicine may also increase your risk for jaw problems or a broken thigh bone. Tell your doctor right away if you have severe pain in your jaw, bones, joints, or muscles. Tell your doctor if you have any pain that does not go away or that gets worse. Tell your dentist and dental surgeon that you are taking this medicine. You should not have major dental surgery while on this medicine. See your dentist to have a dental exam and fix any dental problems before starting this medicine. Take good care of your teeth while on this medicine. Make sure you see your dentist for regular follow-up appointments. What side effects may I notice from receiving this medicine? Side effects that you should report to your doctor or health care professional as soon as possible: -allergic reactions like skin rash, itching or hives, swelling of the face, lips, or tongue -anxiety, confusion, or depression -breathing problems -changes in vision -eye pain -feeling faint or lightheaded, falls -jaw pain, especially after dental work -mouth sores -muscle cramps, stiffness, or weakness -trouble passing urine or change in the amount of urine Side effects that usually do not require medical attention (report to your doctor or health care professional if they continue or are bothersome): -bone, joint, or muscle pain -constipation -diarrhea -fever -hair loss -irritation at site where injected -loss of appetite -nausea, vomiting -stomach upset -trouble sleeping -trouble swallowing -weak or tired This list may not describe all possible side effects. Call your doctor for medical advice about side effects. You may report side effects to FDA at 1-800-FDA-1088. Where should I keep my medicine? This drug is given in a hospital or clinic and will not be stored at home. NOTE: This sheet is a summary. It may not cover all possible information. If you have questions about this medicine, talk to your doctor, pharmacist, or  health care provider.  2015, [employer] Standard. (2012-06-24 13:03:13)

## 2014-02-24 ENCOUNTER — Telehealth: Payer: Self-pay | Admitting: *Deleted

## 2014-02-24 NOTE — Telephone Encounter (Signed)
DR.MCIVER AT [facility] WILL BE SEEING [name]. PT. WANTS TO KNOW IF DR.GORSUCH RECEIVED DR.MCIVER'S NOTES AND THE 02/14/14 LAB RESULTS DRAWN AT [facility].

## 2014-02-27 ENCOUNTER — Other Ambulatory Visit: Payer: Self-pay | Admitting: Hematology and Oncology

## 2014-02-27 NOTE — Telephone Encounter (Signed)
Got them

## 2014-03-01 ENCOUNTER — Ambulatory Visit: Payer: Medicare Other

## 2014-03-08 ENCOUNTER — Ambulatory Visit (HOSPITAL_BASED_OUTPATIENT_CLINIC_OR_DEPARTMENT_OTHER): Payer: Medicare Other

## 2014-03-08 ENCOUNTER — Other Ambulatory Visit (HOSPITAL_BASED_OUTPATIENT_CLINIC_OR_DEPARTMENT_OTHER): Payer: Medicare Other

## 2014-03-08 DIAGNOSIS — C9001 Multiple myeloma in remission: Secondary | ICD-10-CM

## 2014-03-08 DIAGNOSIS — Z5112 Encounter for antineoplastic immunotherapy: Secondary | ICD-10-CM

## 2014-03-08 LAB — COMPREHENSIVE METABOLIC PANEL (CC13)
ALBUMIN: 3.7 g/dL (ref 3.5–5.0)
ALT: 29 U/L (ref 0–55)
ANION GAP: 14 meq/L — AB (ref 3–11)
AST: 24 U/L (ref 5–34)
Alkaline Phosphatase: 109 U/L (ref 40–150)
BILIRUBIN TOTAL: 0.45 mg/dL (ref 0.20–1.20)
BUN: 20.1 mg/dL (ref 7.0–26.0)
CO2: 23 mEq/L (ref 22–29)
Calcium: 9.8 mg/dL (ref 8.4–10.4)
Chloride: 104 mEq/L (ref 98–109)
Creatinine: 1.1 mg/dL (ref 0.6–1.1)
EGFR: 55 mL/min/{1.73_m2} — AB (ref >90)
Glucose: 141 mg/dl — ABNORMAL HIGH (ref 70–140)
POTASSIUM: 4.5 meq/L (ref 3.5–5.1)
Sodium: 141 mEq/L (ref 136–145)
Total Protein: 6.8 g/dL (ref 6.4–8.3)

## 2014-03-08 LAB — CBC WITH DIFFERENTIAL/PLATELET
BASO%: 0.6 % (ref 0.0–2.0)
BASOS ABS: 0 10*3/uL (ref 0.0–0.1)
EOS%: 9.6 % — AB (ref 0.0–7.0)
Eosinophils Absolute: 0.5 10*3/uL (ref 0.0–0.5)
HCT: 34.3 % — ABNORMAL LOW (ref 34.8–46.6)
HGB: 10.9 g/dL — ABNORMAL LOW (ref 11.6–15.9)
LYMPH#: 0.9 10*3/uL (ref 0.9–3.3)
LYMPH%: 19.5 % (ref 14.0–49.7)
MCH: 31.5 pg (ref 25.1–34.0)
MCHC: 31.8 g/dL (ref 31.5–36.0)
MCV: 99 fL (ref 79.5–101.0)
MONO#: 0.4 10*3/uL (ref 0.1–0.9)
MONO%: 9.3 % (ref 0.0–14.0)
NEUT#: 2.9 10*3/uL (ref 1.5–6.5)
NEUT%: 61 % (ref 38.4–76.8)
PLATELETS: 213 10*3/uL (ref 145–400)
RBC: 3.46 10*6/uL — AB (ref 3.70–5.45)
RDW: 16.3 % — ABNORMAL HIGH (ref 11.2–14.5)
WBC: 4.8 10*3/uL (ref 3.9–10.3)

## 2014-03-08 MED ORDER — ONDANSETRON HCL 8 MG PO TABS
ORAL_TABLET | ORAL | Status: AC
  Filled 2014-03-08: qty 1

## 2014-03-08 MED ORDER — ONDANSETRON HCL 8 MG PO TABS
8.0000 mg | ORAL_TABLET | Freq: Once | ORAL | Status: AC
  Administered 2014-03-08: 8 mg via ORAL

## 2014-03-08 MED ORDER — BORTEZOMIB CHEMO SQ INJECTION 3.5 MG (2.5MG/ML)
1.3000 mg/m2 | Freq: Once | INTRAMUSCULAR | Status: AC
  Administered 2014-03-08: 2.75 mg via SUBCUTANEOUS
  Filled 2014-03-08: qty 2.75

## 2014-03-08 NOTE — Patient Instructions (Signed)
[place] Discharge Instructions for Patients Receiving Chemotherapy  Today you received the following chemotherapy agents Velcade.  To help prevent nausea and vomiting after your treatment, we encourage you to take your nausea medication as prescribed.   If you develop nausea and vomiting that is not controlled by your nausea medication, call the clinic.   BELOW ARE SYMPTOMS THAT SHOULD BE REPORTED IMMEDIATELY:  *FEVER GREATER THAN 100.5 F  *CHILLS WITH OR WITHOUT FEVER  NAUSEA AND VOMITING THAT IS NOT CONTROLLED WITH YOUR NAUSEA MEDICATION  *UNUSUAL SHORTNESS OF BREATH  *UNUSUAL BRUISING OR BLEEDING  TENDERNESS IN MOUTH AND THROAT WITH OR WITHOUT PRESENCE OF ULCERS  *URINARY PROBLEMS  *BOWEL PROBLEMS  UNUSUAL RASH Items with * indicate a potential emergency and should be followed up as soon as possible.  Feel free to call the clinic you have any questions or concerns. The clinic phone number is [phone].

## 2014-03-15 ENCOUNTER — Other Ambulatory Visit: Payer: Self-pay | Admitting: Family Medicine

## 2014-03-15 ENCOUNTER — Ambulatory Visit
Admission: RE | Admit: 2014-03-15 | Discharge: 2014-03-15 | Disposition: A | Discharge status: Home / Self Care | Payer: Medicare Other | Source: Ambulatory Visit | Attending: Family Medicine | Admitting: Family Medicine

## 2014-03-15 ENCOUNTER — Emergency Department (HOSPITAL_COMMUNITY): Payer: Medicare Other

## 2014-03-15 ENCOUNTER — Ambulatory Visit: Payer: Medicare Other

## 2014-03-15 ENCOUNTER — Emergency Department (HOSPITAL_COMMUNITY)
Admission: EM | Admit: 2014-03-15 | Discharge: 2014-03-15 | Disposition: A | Discharge status: Home / Self Care | Payer: Medicare Other | Attending: Emergency Medicine | Admitting: Emergency Medicine

## 2014-03-15 ENCOUNTER — Encounter (HOSPITAL_COMMUNITY): Payer: Self-pay | Admitting: Emergency Medicine

## 2014-03-15 DIAGNOSIS — Z8611 Personal history of tuberculosis: Secondary | ICD-10-CM | POA: Insufficient documentation

## 2014-03-15 DIAGNOSIS — E1140 Type 2 diabetes mellitus with diabetic neuropathy, unspecified: Secondary | ICD-10-CM | POA: Diagnosis not present

## 2014-03-15 DIAGNOSIS — M810 Age-related osteoporosis without current pathological fracture: Secondary | ICD-10-CM | POA: Insufficient documentation

## 2014-03-15 DIAGNOSIS — C9000 Multiple myeloma not having achieved remission: Secondary | ICD-10-CM | POA: Diagnosis not present

## 2014-03-15 DIAGNOSIS — I2510 Atherosclerotic heart disease of native coronary artery without angina pectoris: Secondary | ICD-10-CM | POA: Diagnosis not present

## 2014-03-15 DIAGNOSIS — G4733 Obstructive sleep apnea (adult) (pediatric): Secondary | ICD-10-CM | POA: Diagnosis not present

## 2014-03-15 DIAGNOSIS — E119 Type 2 diabetes mellitus without complications: Secondary | ICD-10-CM | POA: Diagnosis not present

## 2014-03-15 DIAGNOSIS — F329 Major depressive disorder, single episode, unspecified: Secondary | ICD-10-CM | POA: Insufficient documentation

## 2014-03-15 DIAGNOSIS — Z79899 Other long term (current) drug therapy: Secondary | ICD-10-CM | POA: Insufficient documentation

## 2014-03-15 DIAGNOSIS — Z8619 Personal history of other infectious and parasitic diseases: Secondary | ICD-10-CM | POA: Diagnosis not present

## 2014-03-15 DIAGNOSIS — F3341 Major depressive disorder, recurrent, in partial remission: Secondary | ICD-10-CM | POA: Diagnosis not present

## 2014-03-15 DIAGNOSIS — R7989 Other specified abnormal findings of blood chemistry: Secondary | ICD-10-CM | POA: Diagnosis present

## 2014-03-15 DIAGNOSIS — Z87891 Personal history of nicotine dependence: Secondary | ICD-10-CM | POA: Insufficient documentation

## 2014-03-15 DIAGNOSIS — R0789 Other chest pain: Secondary | ICD-10-CM | POA: Diagnosis not present

## 2014-03-15 DIAGNOSIS — C3490 Malignant neoplasm of unspecified part of unspecified bronchus or lung: Secondary | ICD-10-CM | POA: Diagnosis not present

## 2014-03-15 DIAGNOSIS — Z85118 Personal history of other malignant neoplasm of bronchus and lung: Secondary | ICD-10-CM | POA: Diagnosis not present

## 2014-03-15 DIAGNOSIS — R079 Chest pain, unspecified: Secondary | ICD-10-CM | POA: Diagnosis not present

## 2014-03-15 DIAGNOSIS — K219 Gastro-esophageal reflux disease without esophagitis: Secondary | ICD-10-CM | POA: Insufficient documentation

## 2014-03-15 DIAGNOSIS — Z8579 Personal history of other malignant neoplasms of lymphoid, hematopoietic and related tissues: Secondary | ICD-10-CM | POA: Diagnosis not present

## 2014-03-15 DIAGNOSIS — E780 Pure hypercholesterolemia: Secondary | ICD-10-CM | POA: Insufficient documentation

## 2014-03-15 DIAGNOSIS — Z8669 Personal history of other diseases of the nervous system and sense organs: Secondary | ICD-10-CM | POA: Diagnosis not present

## 2014-03-15 DIAGNOSIS — I10 Essential (primary) hypertension: Secondary | ICD-10-CM | POA: Diagnosis not present

## 2014-03-15 DIAGNOSIS — F17210 Nicotine dependence, cigarettes, uncomplicated: Secondary | ICD-10-CM | POA: Diagnosis not present

## 2014-03-15 DIAGNOSIS — Z8701 Personal history of pneumonia (recurrent): Secondary | ICD-10-CM | POA: Insufficient documentation

## 2014-03-15 DIAGNOSIS — Z862 Personal history of diseases of the blood and blood-forming organs and certain disorders involving the immune mechanism: Secondary | ICD-10-CM | POA: Diagnosis not present

## 2014-03-15 LAB — I-STAT CHEM 8, ED
BUN: 24 mg/dL — ABNORMAL HIGH (ref 6–23)
CHLORIDE: 107 mmol/L (ref 96–112)
Calcium, Ion: 1.1 mmol/L — ABNORMAL LOW (ref 1.13–1.30)
Creatinine, Ser: 1 mg/dL (ref 0.50–1.10)
Glucose, Bld: 106 mg/dL — ABNORMAL HIGH (ref 70–99)
HCT: 32 % — ABNORMAL LOW (ref 36.0–46.0)
Hemoglobin: 10.9 g/dL — ABNORMAL LOW (ref 12.0–15.0)
Potassium: 4.5 mmol/L (ref 3.5–5.1)
SODIUM: 141 mmol/L (ref 135–145)
TCO2: 24 mmol/L (ref 0–100)

## 2014-03-15 MED ORDER — IOHEXOL 350 MG/ML SOLN
100.0000 mL | Freq: Once | INTRAVENOUS | Status: AC | PRN
  Administered 2014-03-15: 100 mL via INTRAVENOUS

## 2014-03-15 NOTE — ED Notes (Signed)
Pt states that last night started having left mid back pain. Pt states that today she saw here PCP and was sent here for CT ruling out PE due to elevated D-dimer.

## 2014-03-15 NOTE — ED Notes (Signed)
D Dimer from [place] at [place] from earlier today 1.90

## 2014-03-15 NOTE — Discharge Instructions (Signed)
Use heat on the sore area 3 or 4 times a day.  Take ibuprofen, or acetaminophen, for pain.   Chest Wall Pain Chest wall pain is pain in or around the bones and muscles of your chest. It may take up to 6 weeks to get better. It may take longer if you must stay physically active in your work and activities.  CAUSES  Chest wall pain may happen on its own. However, it may be caused by:  A viral illness like the flu.  Injury.  Coughing.  Exercise.  Arthritis.  Fibromyalgia.  Shingles. HOME CARE INSTRUCTIONS   Avoid overtiring physical activity. Try not to strain or perform activities that cause pain. This includes any activities using your chest or your abdominal and side muscles, especially if heavy weights are used.  Put ice on the sore area.  Put ice in a plastic bag.  Place a towel between your skin and the bag.  Leave the ice on for 15-20 minutes per hour while awake for the first 2 days.  Only take over-the-counter or prescription medicines for pain, discomfort, or fever as directed by your caregiver. SEEK IMMEDIATE MEDICAL CARE IF:   Your pain increases, or you are very uncomfortable.  You have a fever.  Your chest pain becomes worse.  You have new, unexplained symptoms.  You have nausea or vomiting.  You feel sweaty or lightheaded.  You have a cough with phlegm (sputum), or you cough up blood. MAKE SURE YOU:   Understand these instructions.  Will watch your condition.  Will get help right away if you are not doing well or get worse. Document Released: 01/13/2005 Document Revised: 04/07/2011 Document Reviewed: 09/09/2010 [facility] Patient Information 2015 [place], [place]. This information is not intended to replace advice given to you by your health care provider. Make sure you discuss any questions you have with your health care provider.

## 2014-03-15 NOTE — ED Provider Notes (Addendum)
CSN: [id number]     Arrival date & time 03/15/14  1603 History   First MD Initiated Contact with Patient 03/15/14 1701     Chief Complaint  Patient presents with  . sent from PCP elevated D-Dimer      (Consider location/radiation/quality/duration/timing/severity/associated sxs/prior Treatment) HPI [name] is a 69 y.o. female who presents for evaluation of left chest wall pain which started last night, without trauma.  She saw her PCP today, labs were drawn and she was told to come here because her d-dimer was elevated at 1.9.  She denies shortness of breath, fever, chills, cough, weakness or dizziness.  She has a history of pneumonectomy, sepsis and pneumonia.  She is taking her usual medications.  She has not tried anything for the pain.  There are no other known modifying factors.   Past Medical History  Diagnosis Date  . Osteoporosis   . Hypercholesterolemia   . Esophageal reflux disease   . Hypertension   . Diabetes type 2, controlled   . Pancytopenia   . Ischemic colitis 2009  . Lung nodule 06/11/2012  . Depression   . Sleep apnea     UPPER AIRWAY RESISTANCE  DOES NOT HAVE CPAP , MAY NEED   . Tuberculosis      (+ TB TEST)YEARS AGO POSSIBLE EXPOSURE WAS MED TX   . Multiple myeloma 05/2012    Cytogenetics on 06/08/2012 was normal 24, XX [20]  . Liver enzyme elevation   . Sepsis   . Pneumonia   . Post-nasal drip 01/11/2014  . Cough 01/11/2014  . Type 2 diabetes mellitus without complications 05/30/6642  . Other fatigue 02/08/2014   Past Surgical History  Procedure Laterality Date  . Colonoscopy  2013    poly; Dr. Oletta Lamas, next one due 2018.l  . Appendectomy    . Tonsillectomy    . Leg surgery    . Video bronchoscopy with endobronchial navigation Right 07/08/2012    Procedure: VIDEO BRONCHOSCOPY WITH ENDOBRONCHIAL NAVIGATION;  Surgeon: Collene Gobble, MD;  Location: [facility];  Service: Thoracic;  Laterality: Right;   Family History  Problem Relation Age of Onset  .  Heart attack Father   . Cancer Sister     synovial sarcoma  . Cancer Maternal Grandfather     ? cancer   History  Substance Use Topics  . Smoking status: Former Smoker -- 1.00 packs/day for 30 years    Types: Cigarettes    Quit date: 01/27/2009  . Smokeless tobacco: Never Used  . Alcohol Use: 0.0 oz/week     Comment: occasionally   OB History    No data available     Review of Systems  All other systems reviewed and are negative.     Allergies  Review of patient's allergies indicates no known allergies.  Home Medications   Prior to Admission medications   Medication Sig Start Date End Date Taking? Authorizing Provider  buPROPion (WELLBUTRIN XL) 300 MG 24 hr tablet Take 300 mg by mouth daily.   Yes Historical Provider, MD  cholecalciferol (VITAMIN D) 1000 UNITS tablet Take 2,000 Units by mouth daily.   Yes Historical Provider, MD  esomeprazole (NEXIUM) 40 MG capsule Take 1 capsule (40 mg total) by mouth 2 (two) times daily. Patient taking differently: Take 40 mg by mouth daily.  10/13/13  Yes Charlynne Cousins, MD  losartan (COZAAR) 50 MG tablet Take 50 mg by mouth daily.   Yes Historical Provider, MD  magnesium chloride (  SLOW-MAG) 64 MG TBEC SR tablet Take 1 tablet by mouth 2 (two) times daily.    Yes Historical Provider, MD  metFORMIN (GLUCOPHAGE) 500 MG tablet Take 1 tablet (500 mg total) by mouth 2 (two) times daily with a meal. 01/31/14  Yes Heath Lark, MD  metoprolol succinate (TOPROL-XL) 50 MG 24 hr tablet Take 50 mg by mouth daily. Take with or immediately following a meal.   Yes Historical Provider, MD  Multiple Vitamins-Minerals (MULTIVITAMIN WITH MINERALS) tablet Take 1 tablet by mouth at bedtime.    Yes Historical Provider, MD  Multiple Vitamins-Minerals (PRESERVISION AREDS 2) CAPS Take 1 capsule by mouth 2 (two) times daily.   Yes Historical Provider, MD  naproxen sodium (ANAPROX) 220 MG tablet Take 220 mg by mouth 2 (two) times daily as needed (pain).   Yes  Historical Provider, MD  sertraline (ZOLOFT) 100 MG tablet Take 100 mg by mouth daily.    Yes Historical Provider, MD  simvastatin (ZOCOR) 40 MG tablet Take 40 mg by mouth at bedtime.    Yes Historical Provider, MD  acyclovir (ZOVIRAX) 400 MG tablet Take 1 tablet (400 mg total) by mouth 2 (two) times daily. Patient not taking: Reported on 03/15/2014 01/18/13   Concha Norway, MD  triamcinolone (NASACORT) 55 MCG/ACT AERO nasal inhaler Place 2 sprays into the nose daily. Patient not taking: Reported on 03/15/2014 01/11/14   Heath Lark, MD   BP 142/63 mmHg  Pulse 76  Temp(Src) 97.8 F (36.6 C) (Oral)  Resp 20  SpO2 96% Physical Exam  Constitutional: She is oriented to person, place, and time. She appears well-developed and well-nourished. No distress.  HENT:  Head: Normocephalic and atraumatic.  Right Ear: External ear normal.  Left Ear: External ear normal.  Eyes: Conjunctivae and EOM are normal. Pupils are equal, round, and reactive to light.  Neck: Normal range of motion and phonation normal. Neck supple.  Cardiovascular: Normal rate, regular rhythm and normal heart sounds.   Pulmonary/Chest: Effort normal and breath sounds normal. No respiratory distress. She has no wheezes. She exhibits tenderness (tender left lower chest wall, without crepitation or deformity). She exhibits no bony tenderness.  Abdominal: Soft. There is no tenderness.  Musculoskeletal: Normal range of motion.  Neurological: She is alert and oriented to person, place, and time. No cranial nerve deficit or sensory deficit. She exhibits normal muscle tone. Coordination normal.  Skin: Skin is warm, dry and intact.  Psychiatric: She has a normal mood and affect. Her behavior is normal. Judgment and thought content normal.  Nursing note and vitals reviewed.   ED Course  Procedures (including critical care time)  Medications - No data to display  Patient Vitals for the past 24 hrs:  BP Temp Temp src Pulse Resp SpO2   03/15/14 1615 142/63 mmHg 97.8 F (36.6 C) Oral 76 20 96 %  .  8:21 PM Reevaluation with update and discussion. After initial assessment and treatment, an updated evaluation reveals she remains comfortable.  Findings discussed with patient, all questions answered. [place]  I-STAT CHEM 8, ED  I-STAT CHEM 8, ED    Imaging Review Dg Chest 2 View  03/15/2014   CLINICAL DATA:  Posterior left chest pain. 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  EXAM: CHEST  2 VIEW  COMPARISON:  Chest x-rays dated 02/10/2014 and 11/10/2013  FINDINGS: Heart size and pulmonary vascularity are normal. Chronic elevation of right hemidiaphragm after previous lobectomy.  No acute infiltrates or effusions. Minimal scarring around the inferior aspect of the left hilum, stable. No discrete acute osseous abnormality. No pneumothorax.  IMPRESSION: No acute abnormalities.   Electronically Signed   By: Lorriane Shire M.D.   On: 03/15/2014 15:16     EKG Interpretation   Date/Time:  Wednesday March 15 2014 16:12:01 EST Ventricular Rate:  78 PR Interval:  132 QRS Duration: 86 QT Interval:  389 QTC Calculation: 443 R Axis:   72 Text Interpretation:  Sinus rhythm Baseline wander in lead(s) V1 V2 V3  Confirmed by Ashok Cordia  MD, Lennette Bihari ([id number]) on 03/15/2014 4:39:38 PM      MDM   Final diagnoses:  Chest wall pain    Evaluation, consistent with nonspecific chest wall pain.  No evidence for pneumonia, PE, bony tumor, or lung tumor.  Doubt serous bacterial infection.  Metabolic instability or impending vascular collapse   Nursing Notes Reviewed/ Care Coordinated Applicable Imaging Reviewed Interpretation of Laboratory Data incorporated into ED treatment  The patient appears reasonably screened and/or stabilized for discharge and I doubt any other medical condition or other [facility] requiring further screening, evaluation, or treatment in the ED at this time prior  to discharge.  Plan: Home Medications- OTC analgesia; Home Treatments- heat treatment; return here if the recommended treatment, does not improve the symptoms; Recommended follow up- PCP when necessary    Richarda Blade, MD 03/15/14 2022  Richarda Blade, MD 03/15/14 [phone]

## 2014-03-22 ENCOUNTER — Ambulatory Visit (HOSPITAL_BASED_OUTPATIENT_CLINIC_OR_DEPARTMENT_OTHER): Payer: Medicare Other | Admitting: Hematology and Oncology

## 2014-03-22 ENCOUNTER — Telehealth: Payer: Self-pay | Admitting: Hematology and Oncology

## 2014-03-22 ENCOUNTER — Ambulatory Visit (HOSPITAL_BASED_OUTPATIENT_CLINIC_OR_DEPARTMENT_OTHER): Payer: Medicare Other

## 2014-03-22 ENCOUNTER — Other Ambulatory Visit (HOSPITAL_BASED_OUTPATIENT_CLINIC_OR_DEPARTMENT_OTHER): Payer: Medicare Other

## 2014-03-22 ENCOUNTER — Encounter: Payer: Self-pay | Admitting: Hematology and Oncology

## 2014-03-22 VITALS — BP 139/54 | HR 76 | Temp 97.7°F | Resp 18 | Ht 65.0 in | Wt 199.8 lb

## 2014-03-22 DIAGNOSIS — D630 Anemia in neoplastic disease: Secondary | ICD-10-CM

## 2014-03-22 DIAGNOSIS — C9001 Multiple myeloma in remission: Secondary | ICD-10-CM

## 2014-03-22 DIAGNOSIS — Z85110 Personal history of malignant carcinoid tumor of bronchus and lung: Secondary | ICD-10-CM | POA: Diagnosis not present

## 2014-03-22 DIAGNOSIS — E119 Type 2 diabetes mellitus without complications: Secondary | ICD-10-CM | POA: Diagnosis not present

## 2014-03-22 DIAGNOSIS — Z5112 Encounter for antineoplastic immunotherapy: Secondary | ICD-10-CM

## 2014-03-22 DIAGNOSIS — C9000 Multiple myeloma not having achieved remission: Secondary | ICD-10-CM

## 2014-03-22 LAB — CBC WITH DIFFERENTIAL/PLATELET
BASO%: 0.5 % (ref 0.0–2.0)
Basophils Absolute: 0 10*3/uL (ref 0.0–0.1)
EOS ABS: 0.2 10*3/uL (ref 0.0–0.5)
EOS%: 5.5 % (ref 0.0–7.0)
HEMATOCRIT: 34.7 % — AB (ref 34.8–46.6)
HGB: 11.2 g/dL — ABNORMAL LOW (ref 11.6–15.9)
LYMPH%: 19.9 % (ref 14.0–49.7)
MCH: 32.1 pg (ref 25.1–34.0)
MCHC: 32.3 g/dL (ref 31.5–36.0)
MCV: 99.4 fL (ref 79.5–101.0)
MONO#: 0.4 10*3/uL (ref 0.1–0.9)
MONO%: 9.1 % (ref 0.0–14.0)
NEUT%: 65 % (ref 38.4–76.8)
NEUTROS ABS: 2.6 10*3/uL (ref 1.5–6.5)
Platelets: 183 10*3/uL (ref 145–400)
RBC: 3.49 10*6/uL — ABNORMAL LOW (ref 3.70–5.45)
RDW: 15.1 % — AB (ref 11.2–14.5)
WBC: 4 10*3/uL (ref 3.9–10.3)
lymph#: 0.8 10*3/uL — ABNORMAL LOW (ref 0.9–3.3)

## 2014-03-22 LAB — COMPREHENSIVE METABOLIC PANEL (CC13)
ALBUMIN: 3.6 g/dL (ref 3.5–5.0)
ALT: 27 U/L (ref 0–55)
ANION GAP: 11 meq/L (ref 3–11)
AST: 21 U/L (ref 5–34)
Alkaline Phosphatase: 102 U/L (ref 40–150)
BUN: 22.4 mg/dL (ref 7.0–26.0)
CALCIUM: 9.7 mg/dL (ref 8.4–10.4)
CHLORIDE: 106 meq/L (ref 98–109)
CO2: 24 mEq/L (ref 22–29)
Creatinine: 0.9 mg/dL (ref 0.6–1.1)
EGFR: 67 mL/min/{1.73_m2} — ABNORMAL LOW (ref >90)
Glucose: 156 mg/dl — ABNORMAL HIGH (ref 70–140)
POTASSIUM: 4.6 meq/L (ref 3.5–5.1)
SODIUM: 141 meq/L (ref 136–145)
TOTAL PROTEIN: 6.6 g/dL (ref 6.4–8.3)
Total Bilirubin: 0.43 mg/dL (ref 0.20–1.20)

## 2014-03-22 MED ORDER — BORTEZOMIB CHEMO SQ INJECTION 3.5 MG (2.5MG/ML)
1.3000 mg/m2 | Freq: Once | INTRAMUSCULAR | Status: AC
  Administered 2014-03-22: 2.75 mg via SUBCUTANEOUS
  Filled 2014-03-22: qty 2.75

## 2014-03-22 MED ORDER — SODIUM CHLORIDE 0.9 % IV SOLN
Freq: Once | INTRAVENOUS | Status: AC
  Administered 2014-03-22: 12:00:00 via INTRAVENOUS

## 2014-03-22 MED ORDER — ACYCLOVIR 400 MG PO TABS: 400.0000 mg | ORAL_TABLET | Freq: Every day | ORAL | Status: DC

## 2014-03-22 MED ORDER — ONDANSETRON HCL 8 MG PO TABS
ORAL_TABLET | ORAL | Status: AC
  Filled 2014-03-22: qty 1

## 2014-03-22 MED ORDER — ZOLEDRONIC ACID 4 MG/100ML IV SOLN
4.0000 mg | Freq: Once | INTRAVENOUS | Status: AC
  Administered 2014-03-22: 4 mg via INTRAVENOUS
  Filled 2014-03-22: qty 100

## 2014-03-22 MED ORDER — ONDANSETRON HCL 8 MG PO TABS
8.0000 mg | ORAL_TABLET | Freq: Once | ORAL | Status: AC
  Administered 2014-03-22: 8 mg via ORAL

## 2014-03-22 NOTE — Patient Instructions (Signed)
[place] Discharge Instructions for Patients Receiving Chemotherapy  Today you received the following chemotherapy agents Velcade To help prevent nausea and vomiting after your treatment, we encourage you to take your nausea medication as prescribed.   If you develop nausea and vomiting that is not controlled by your nausea medication, call the clinic.   BELOW ARE SYMPTOMS THAT SHOULD BE REPORTED IMMEDIATELY:  *FEVER GREATER THAN 100.5 F  *CHILLS WITH OR WITHOUT FEVER  NAUSEA AND VOMITING THAT IS NOT CONTROLLED WITH YOUR NAUSEA MEDICATION  *UNUSUAL SHORTNESS OF BREATH  *UNUSUAL BRUISING OR BLEEDING  TENDERNESS IN MOUTH AND THROAT WITH OR WITHOUT PRESENCE OF ULCERS  *URINARY PROBLEMS  *BOWEL PROBLEMS  UNUSUAL RASH Items with * indicate a potential emergency and should be followed up as soon as possible.  Feel free to call the clinic you have any questions or concerns. The clinic phone number is [phone].   Zoledronic Acid injection (Hypercalcemia, Oncology) What is this medicine? ZOLEDRONIC ACID (ZOE le dron ik AS id) lowers the amount of calcium loss from bone. It is used to treat too much calcium in your blood from cancer. It is also used to prevent complications of cancer that has spread to the bone. This medicine may be used for other purposes; ask your health care provider or pharmacist if you have questions. COMMON BRAND NAME(S): Zometa What should I tell my health care provider before I take this medicine? They need to know if you have any of these conditions: -aspirin-sensitive asthma -cancer, especially if you are receiving medicines used to treat cancer -dental disease or wear dentures -infection -kidney disease -receiving corticosteroids like dexamethasone or prednisone -an unusual or allergic reaction to zoledronic acid, other medicines, foods, dyes, or preservatives -pregnant or trying to get pregnant -breast-feeding How should I use  this medicine? This medicine is for infusion into a vein. It is given by a health care professional in a hospital or clinic setting. Talk to your pediatrician regarding the use of this medicine in children. Special care may be needed. Overdosage: If you think you have taken too much of this medicine contact a poison control center or emergency room at once. NOTE: This medicine is only for you. Do not share this medicine with others. What if I miss a dose? It is important not to miss your dose. Call your doctor or health care professional if you are unable to keep an appointment. What may interact with this medicine? -certain antibiotics given by injection -NSAIDs, medicines for pain and inflammation, like ibuprofen or naproxen -some diuretics like bumetanide, furosemide -teriparatide -thalidomide This list may not describe all possible interactions. Give your health care provider a list of all the medicines, herbs, non-prescription drugs, or dietary supplements you use. Also tell them if you smoke, drink alcohol, or use illegal drugs. Some items may interact with your medicine. What should I watch for while using this medicine? Visit your doctor or health care professional for regular checkups. It may be some time before you see the benefit from this medicine. Do not stop taking your medicine unless your doctor tells you to. Your doctor may order blood tests or other tests to see how you are doing. Women should inform their doctor if they wish to become pregnant or think they might be pregnant. There is a potential for serious side effects to an unborn child. Talk to your health care professional or pharmacist for more information. You should make sure that you get enough  calcium and vitamin D while you are taking this medicine. Discuss the foods you eat and the vitamins you take with your health care professional. Some people who take this medicine have severe bone, joint, and/or muscle pain. This  medicine may also increase your risk for jaw problems or a broken thigh bone. Tell your doctor right away if you have severe pain in your jaw, bones, joints, or muscles. Tell your doctor if you have any pain that does not go away or that gets worse. Tell your dentist and dental surgeon that you are taking this medicine. You should not have major dental surgery while on this medicine. See your dentist to have a dental exam and fix any dental problems before starting this medicine. Take good care of your teeth while on this medicine. Make sure you see your dentist for regular follow-up appointments. What side effects may I notice from receiving this medicine? Side effects that you should report to your doctor or health care professional as soon as possible: -allergic reactions like skin rash, itching or hives, swelling of the face, lips, or tongue -anxiety, confusion, or depression -breathing problems -changes in vision -eye pain -feeling faint or lightheaded, falls -jaw pain, especially after dental work -mouth sores -muscle cramps, stiffness, or weakness -trouble passing urine or change in the amount of urine Side effects that usually do not require medical attention (report to your doctor or health care professional if they continue or are bothersome): -bone, joint, or muscle pain -constipation -diarrhea -fever -hair loss -irritation at site where injected -loss of appetite -nausea, vomiting -stomach upset -trouble sleeping -trouble swallowing -weak or tired This list may not describe all possible side effects. Call your doctor for medical advice about side effects. You may report side effects to FDA at 1-800-FDA-1088. Where should I keep my medicine? This drug is given in a hospital or clinic and will not be stored at home. NOTE: This sheet is a summary. It may not cover all possible information. If you have questions about this medicine, talk to your doctor, pharmacist, or health  care provider.  2015, [employer]vier/Gold Standard. (2012-06-24 13:03:13)

## 2014-03-22 NOTE — Telephone Encounter (Signed)
lvm for pt regarding to March and April appt.Marland KitchenMarland KitchenMarland KitchenMarland Kitchenpt will get sched in tx.

## 2014-03-23 NOTE — Assessment & Plan Note (Signed)
Recent repeat CT scan of the chest show no evidence of recurrence.

## 2014-03-23 NOTE — Progress Notes (Signed)
[facility] OFFICE PROGRESS NOTE  Patient Care Team: Jonathon Bellows, MD as PCP - General (Family Medicine) Nobie Putnam, MD (Hematology and Oncology) Winfield Cunas., MD as Attending Physician (Gastroenterology) Jerrell Belfast, MD as Attending Physician (Otolaryngology) Collene Gobble, MD as Attending Physician (Pulmonary Disease)  SUMMARY OF ONCOLOGIC HISTORY:   Multiple myeloma in remission   05/27/2012 Initial Diagnosis Multiple myeloma, without mention of having achieved remission(203.00)   05/27/2012 Cancer Staging 1.  IgG kappa multiple myeloma. She presented in 05/2012 with anemia, Cr 0.8, Ca 9.6; no bone lytic lesion; LDH 140; beta2- microglobulin 1.86 (ref 1.01-1.73). Positive SPEP for M-spike at 1.69, IgG 2080, kappa 7.24, kappa:lambda ratio 24.97; UPEP positive   06/08/2012 Bone Marrow Biopsy Bone marrow biopsy on 06/08/12 showed 19% plasma cell; normal cytogenetics; myeloma FISH pending.    06/14/2012 Imaging Skeletal survey negative for bone lytic lesions.  Lung nodule apparent.   09/17/2012 Bone Marrow Biopsy Bone marrow biopsy showed 11% plasma cells.   10/18/2012 - 12/31/2012 Chemotherapy Started on VRD (Velcade 1.3 mg/m^2 [place] days 1,8 and 15; Revlimide 25 mg PO daily, on day 1 through 14; Decadron 40 mg PO Days 1, 8, 15).   Started on coumadin given RD.   01/14/2013 Bone Marrow Biopsy The bone marrow clot sections are normocellular toslightly hypercellular for age (approximately 40% overall). 1% plasma cells.   02/08/2013 Bone Marrow Transplant Auto Transplant at [facility] (Dr. Glorianne Manchester McIver).   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 Lamonte Sakai)   07/08/2012 Pathology Results Pathology consistent with  Non-small cell lung cancer of RLL.    07/14/2012 Imaging 1. Right lower lobe nodule is non hypermetabolic which suggests benign etiology considering the slow growing rate in 6 years or non FDG avid disease as seen with carcinoid tumors. Pending biopsy. No PET evidence of mediastinal nodal disease.    08/02/2012 Imaging MRI of brain negative.   08/12/2012 Surgery Robotic Lobectomy of R Lower lobe.    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     Surgical resection margins are negative.     Two benign lymph nodes.     pTa1N0    INTERVAL HISTORY: Please see below for problem oriented charting. She is seen prior to her chemo injection. She was seen recently in the emergency department due to left-sided chest pain which subsequently resolved. Imaging study excluded pulmonary emboli. Otherwise she feels well.  REVIEW OF SYSTEMS:   Constitutional: Denies fevers, chills or abnormal weight loss Eyes: Denies blurriness of vision Ears, nose, mouth, throat, and face: Denies mucositis or sore throat Respiratory: Denies cough, dyspnea or wheezes Cardiovascular: Denies palpitation, chest discomfort or lower extremity swelling Gastrointestinal:  Denies nausea, heartburn or change in bowel habits Skin: Denies abnormal skin rashes Lymphatics: Denies new lymphadenopathy or easy bruising Neurological:Denies numbness, tingling or new weaknesses Behavioral/Psych: Mood is stable, no new changes  All other systems were reviewed with the patient and are negative.  I have reviewed the past medical history, past surgical history, social history and family history with the patient and they are unchanged from previous note.  ALLERGIES:  has No  Known Allergies.  MEDICATIONS:  Current Outpatient Prescriptions  Medication Sig Dispense Refill  . acyclovir (ZOVIRAX) 400 MG tablet Take 1 tablet (400 mg total) by mouth daily. 90 tablet 3   . buPROPion (WELLBUTRIN XL) 300 MG 24 hr tablet Take 300 mg by mouth daily.    . cholecalciferol (VITAMIN D) 1000 UNITS tablet Take 2,000 Units by mouth daily.    Marland Kitchen esomeprazole (NEXIUM) 40 MG capsule Take 1 capsule (40 mg total) by mouth 2 (two) times daily. (Patient taking differently: Take 40 mg by mouth daily. ) 30 capsule 0  . losartan (COZAAR) 50 MG tablet Take 50 mg by mouth daily.    . magnesium chloride (SLOW-MAG) 64 MG TBEC SR tablet Take 1 tablet by mouth 2 (two) times daily.     . metFORMIN (GLUCOPHAGE) 500 MG tablet Take 1 tablet (500 mg total) by mouth 2 (two) times daily with a meal. 60 tablet 1  . metoprolol succinate (TOPROL-XL) 50 MG 24 hr tablet Take 50 mg by mouth daily. Take with or immediately following a meal.    . Multiple Vitamins-Minerals (MULTIVITAMIN WITH MINERALS) tablet Take 1 tablet by mouth at bedtime.     . Multiple Vitamins-Minerals (PRESERVISION AREDS 2) CAPS Take 1 capsule by mouth 2 (two) times daily.    . naproxen sodium (ANAPROX) 220 MG tablet Take 220 mg by mouth 2 (two) times daily as needed (pain).    Marland Kitchen sertraline (ZOLOFT) 100 MG tablet Take 100 mg by mouth daily.     . simvastatin (ZOCOR) 40 MG tablet Take 40 mg by mouth at bedtime.     . triamcinolone (NASACORT) 55 MCG/ACT AERO nasal inhaler Place 2 sprays into the nose daily. 1 Inhaler 12   No current facility-administered medications for this visit.    PHYSICAL EXAMINATION: ECOG PERFORMANCE STATUS: 0 - Asymptomatic  Filed Vitals:   03/22/14 1054  BP: 139/54  Pulse: 76  Temp: 97.7 F (36.5 C)  Resp: 18   Filed Weights   03/22/14 1054  Weight: 199 lb 12.8 oz (90.629 kg)    GENERAL:alert, no distress and comfortable SKIN: skin color, texture, turgor are normal, no rashes or significant lesions EYES: normal, Conjunctiva are pink and non-injected, sclera clear OROPHARYNX:no exudate, no erythema and lips, buccal mucosa, and tongue normal  NECK: supple, thyroid normal size, non-tender,  without nodularity LYMPH:  no palpable lymphadenopathy in the cervical, axillary or inguinal LUNGS: clear to auscultation and percussion with normal breathing effort HEART: regular rate & rhythm and no murmurs and no lower extremity edema ABDOMEN:abdomen soft, non-tender and normal bowel sounds Musculoskeletal:no cyanosis of digits and no clubbing  NEURO: alert & oriented x 3 with fluent speech, no focal motor/sensory deficits  LABORATORY DATA:  I have reviewed the data as listed    Component Value Date/Time   NA 141 03/22/2014 1040   NA 141 03/15/2014 1741   K 4.6 03/22/2014 1040   K 4.5 03/15/2014 1741   CL 107 03/15/2014 1741   CL 105 06/08/2012 0913   CO2 24 03/22/2014 1040   CO2 29 10/10/2013 0726   GLUCOSE 156* 03/22/2014 1040   GLUCOSE 106* 03/15/2014 1741   GLUCOSE 120* 06/08/2012 0913   BUN 22.4 03/22/2014 1040   BUN 24* 03/15/2014 1741   CREATININE 0.9 03/22/2014 1040   CREATININE 1.00 03/15/2014 1741   CALCIUM 9.7 03/22/2014 1040   CALCIUM 9.1 10/10/2013 0726   PROT 6.6 03/22/2014 1040   PROT 5.9* 10/08/2013 3335  ALBUMIN 3.6 03/22/2014 1040   ALBUMIN 2.2* 10/08/2013 0927   AST 21 03/22/2014 1040   AST 36 10/08/2013 0927   ALT 27 03/22/2014 1040   ALT 46* 10/08/2013 0927   ALKPHOS 102 03/22/2014 1040   ALKPHOS 178* 10/08/2013 0927   BILITOT 0.43 03/22/2014 1040   BILITOT 0.4 10/08/2013 0927   GFRNONAA 87* 10/10/2013 0726   GFRAA >90 10/10/2013 0726    No results found for: SPEP, UPEP  Lab Results  Component Value Date   WBC 4.0 03/22/2014   NEUTROABS 2.6 03/22/2014   HGB 11.2* 03/22/2014   HCT 34.7* 03/22/2014   MCV 99.4 03/22/2014   PLT 183 03/22/2014      Chemistry      Component Value Date/Time   NA 141 03/22/2014 1040   NA 141 03/15/2014 1741   K 4.6 03/22/2014 1040   K 4.5 03/15/2014 1741   CL 107 03/15/2014 1741   CL 105 06/08/2012 0913   CO2 24 03/22/2014 1040   CO2 29 10/10/2013 0726   BUN 22.4 03/22/2014 1040   BUN 24*  03/15/2014 1741   CREATININE 0.9 03/22/2014 1040   CREATININE 1.00 03/15/2014 1741      Component Value Date/Time   CALCIUM 9.7 03/22/2014 1040   CALCIUM 9.1 10/10/2013 0726   ALKPHOS 102 03/22/2014 1040   ALKPHOS 178* 10/08/2013 0927   AST 21 03/22/2014 1040   AST 36 10/08/2013 0927   ALT 27 03/22/2014 1040   ALT 46* 10/08/2013 0927   BILITOT 0.43 03/22/2014 1040   BILITOT 0.4 10/08/2013 0927       RADIOGRAPHIC STUDIES: I reviewed recent CT scan I have personally reviewed the radiological images as listed and agreed with the findings in the report.  ASSESSMENT & PLAN:  Multiple myeloma in remission  I am 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 This is only mild, grade 1. Recommend close observation. She will continue Velcade every other week for 2 years per recommendation from the transplant physician.   Carcinoid tumor of left lung Recent repeat CT scan of the chest show no evidence of recurrence.   Type 2 diabetes mellitus without complications  She has significant elevated blood sugar but is not on treatment. I recommend she resume metformin.   Anemia in neoplastic disease This is likely anemia of chronic disease. The patient denies recent history of bleeding such as epistaxis, hematuria or hematochezia. She is asymptomatic from the anemia. We will observe for now.  She does not require transfusion now.      Orders Placed This Encounter  Procedures  . SPEP & IFE with QIG    Standing Status: Future     Number of Occurrences:      Standing Expiration Date: 04/26/2015  . Kappa/lambda light chains    Standing Status: Future     Number of Occurrences:      Standing Expiration Date: 04/26/2015   All questions were answered. The patient knows to call the clinic with any problems, questions or concerns. No barriers to learning was detected. I spent 25 minutes counseling the patient face to face. The total time spent in the appointment was 30 minutes and more  than 50% was on counseling and review of test results     [facility], Kailand Seda, MD 03/23/2014 10:44 AM

## 2014-03-23 NOTE — Assessment & Plan Note (Signed)
I am 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 This is only mild, grade 1. Recommend close observation. She will continue Velcade every other week for 2 years per recommendation from the transplant physician.

## 2014-03-23 NOTE — Assessment & Plan Note (Signed)
This is likely anemia of chronic disease. The patient denies recent history of bleeding such as epistaxis, hematuria or hematochezia. She is asymptomatic from the anemia. We will observe for now.  She does not require transfusion now.   

## 2014-03-23 NOTE — Assessment & Plan Note (Signed)
She has significant elevated blood sugar but is not on treatment. I recommend she resume metformin.

## 2014-04-05 ENCOUNTER — Encounter: Payer: Self-pay | Admitting: General Practice

## 2014-04-05 ENCOUNTER — Ambulatory Visit (HOSPITAL_BASED_OUTPATIENT_CLINIC_OR_DEPARTMENT_OTHER): Payer: Medicare Other

## 2014-04-05 ENCOUNTER — Other Ambulatory Visit (HOSPITAL_BASED_OUTPATIENT_CLINIC_OR_DEPARTMENT_OTHER): Payer: Medicare Other

## 2014-04-05 DIAGNOSIS — C9001 Multiple myeloma in remission: Secondary | ICD-10-CM | POA: Diagnosis not present

## 2014-04-05 DIAGNOSIS — Z5112 Encounter for antineoplastic immunotherapy: Secondary | ICD-10-CM | POA: Diagnosis not present

## 2014-04-05 LAB — CBC WITH DIFFERENTIAL/PLATELET
BASO%: 0.5 % (ref 0.0–2.0)
BASOS ABS: 0 10*3/uL (ref 0.0–0.1)
EOS%: 5.3 % (ref 0.0–7.0)
Eosinophils Absolute: 0.2 10*3/uL (ref 0.0–0.5)
HCT: 32.9 % — ABNORMAL LOW (ref 34.8–46.6)
HGB: 10.4 g/dL — ABNORMAL LOW (ref 11.6–15.9)
LYMPH%: 21.7 % (ref 14.0–49.7)
MCH: 31.5 pg (ref 25.1–34.0)
MCHC: 31.6 g/dL (ref 31.5–36.0)
MCV: 99.7 fL (ref 79.5–101.0)
MONO#: 0.3 10*3/uL (ref 0.1–0.9)
MONO%: 8.6 % (ref 0.0–14.0)
NEUT#: 2.5 10*3/uL (ref 1.5–6.5)
NEUT%: 63.9 % (ref 38.4–76.8)
Platelets: 165 10*3/uL (ref 145–400)
RBC: 3.3 10*6/uL — ABNORMAL LOW (ref 3.70–5.45)
RDW: 14.7 % — AB (ref 11.2–14.5)
WBC: 4 10*3/uL (ref 3.9–10.3)
lymph#: 0.9 10*3/uL (ref 0.9–3.3)

## 2014-04-05 LAB — COMPREHENSIVE METABOLIC PANEL (CC13)
ALBUMIN: 3.6 g/dL (ref 3.5–5.0)
ALT: 23 U/L (ref 0–55)
ANION GAP: 11 meq/L (ref 3–11)
AST: 19 U/L (ref 5–34)
Alkaline Phosphatase: 96 U/L (ref 40–150)
BUN: 15.6 mg/dL (ref 7.0–26.0)
CALCIUM: 10 mg/dL (ref 8.4–10.4)
CHLORIDE: 107 meq/L (ref 98–109)
CO2: 25 mEq/L (ref 22–29)
CREATININE: 1 mg/dL (ref 0.6–1.1)
EGFR: 61 mL/min/{1.73_m2} — ABNORMAL LOW (ref >90)
Glucose: 142 mg/dl — ABNORMAL HIGH (ref 70–140)
Potassium: 4.3 mEq/L (ref 3.5–5.1)
Sodium: 144 mEq/L (ref 136–145)
Total Bilirubin: 0.39 mg/dL (ref 0.20–1.20)
Total Protein: 6.5 g/dL (ref 6.4–8.3)

## 2014-04-05 MED ORDER — BORTEZOMIB CHEMO SQ INJECTION 3.5 MG (2.5MG/ML)
1.3000 mg/m2 | Freq: Once | INTRAMUSCULAR | Status: AC
  Administered 2014-04-05: 2.75 mg via SUBCUTANEOUS
  Filled 2014-04-05: qty 2.75

## 2014-04-05 MED ORDER — ONDANSETRON HCL 8 MG PO TABS
8.0000 mg | ORAL_TABLET | Freq: Once | ORAL | Status: AC
  Administered 2014-04-05: 8 mg via ORAL

## 2014-04-05 MED ORDER — ONDANSETRON HCL 8 MG PO TABS
ORAL_TABLET | ORAL | Status: AC
  Filled 2014-04-05: qty 1

## 2014-04-05 NOTE — Patient Instructions (Signed)
[facility] Discharge Instructions for Patients Receiving Chemotherapy  Today you received the following chemotherapy agents Velcade To help prevent nausea and vomiting after your treatment, we encourage you to take your nausea medication as prescribed.   If you develop nausea and vomiting that is not controlled by your nausea medication, call the clinic.   BELOW ARE SYMPTOMS THAT SHOULD BE REPORTED IMMEDIATELY:  *FEVER GREATER THAN 100.5 F  *CHILLS WITH OR WITHOUT FEVER  NAUSEA AND VOMITING THAT IS NOT CONTROLLED WITH YOUR NAUSEA MEDICATION  *UNUSUAL SHORTNESS OF BREATH  *UNUSUAL BRUISING OR BLEEDING  TENDERNESS IN MOUTH AND THROAT WITH OR WITHOUT PRESENCE OF ULCERS  *URINARY PROBLEMS  *BOWEL PROBLEMS  UNUSUAL RASH Items with * indicate a potential emergency and should be followed up as soon as possible.  Feel free to call the clinic you have any questions or concerns. The clinic phone number is [phone].   Zoledronic Acid injection (Hypercalcemia, Oncology) What is this medicine? ZOLEDRONIC ACID (ZOE le dron ik AS id) lowers the amount of calcium loss from bone. It is used to treat too much calcium in your blood from cancer. It is also used to prevent complications of cancer that has spread to the bone. This medicine may be used for other purposes; ask your health care provider or pharmacist if you have questions. COMMON BRAND NAME(S): Zometa What should I tell my health care provider before I take this medicine? They need to know if you have any of these conditions: -aspirin-sensitive asthma -cancer, especially if you are receiving medicines used to treat cancer -dental disease or wear dentures -infection -kidney disease -receiving corticosteroids like dexamethasone or prednisone -an unusual or allergic reaction to zoledronic acid, other medicines, foods, dyes, or preservatives -pregnant or trying to get pregnant -breast-feeding How should I use  this medicine? This medicine is for infusion into a vein. It is given by a health care professional in a hospital or clinic setting. Talk to your pediatrician regarding the use of this medicine in children. Special care may be needed. Overdosage: If you think you have taken too much of this medicine contact a poison control center or emergency room at once. NOTE: This medicine is only for you. Do not share this medicine with others. What if I miss a dose? It is important not to miss your dose. Call your doctor or health care professional if you are unable to keep an appointment. What may interact with this medicine? -certain antibiotics given by injection -NSAIDs, medicines for pain and inflammation, like ibuprofen or naproxen -some diuretics like bumetanide, furosemide -teriparatide -thalidomide This list may not describe all possible interactions. Give your health care provider a list of all the medicines, herbs, non-prescription drugs, or dietary supplements you use. Also tell them if you smoke, drink alcohol, or use illegal drugs. Some items may interact with your medicine. What should I watch for while using this medicine? Visit your doctor or health care professional for regular checkups. It may be some time before you see the benefit from this medicine. Do not stop taking your medicine unless your doctor tells you to. Your doctor may order blood tests or other tests to see how you are doing. Women should inform their doctor if they wish to become pregnant or think they might be pregnant. There is a potential for serious side effects to an unborn child. Talk to your health care professional or pharmacist for more information. You should make sure that you get enough  calcium and vitamin D while you are taking this medicine. Discuss the foods you eat and the vitamins you take with your health care professional. Some people who take this medicine have severe bone, joint, and/or muscle pain. This  medicine may also increase your risk for jaw problems or a broken thigh bone. Tell your doctor right away if you have severe pain in your jaw, bones, joints, or muscles. Tell your doctor if you have any pain that does not go away or that gets worse. Tell your dentist and dental surgeon that you are taking this medicine. You should not have major dental surgery while on this medicine. See your dentist to have a dental exam and fix any dental problems before starting this medicine. Take good care of your teeth while on this medicine. Make sure you see your dentist for regular follow-up appointments. What side effects may I notice from receiving this medicine? Side effects that you should report to your doctor or health care professional as soon as possible: -allergic reactions like skin rash, itching or hives, swelling of the face, lips, or tongue -anxiety, confusion, or depression -breathing problems -changes in vision -eye pain -feeling faint or lightheaded, falls -jaw pain, especially after dental work -mouth sores -muscle cramps, stiffness, or weakness -trouble passing urine or change in the amount of urine Side effects that usually do not require medical attention (report to your doctor or health care professional if they continue or are bothersome): -bone, joint, or muscle pain -constipation -diarrhea -fever -hair loss -irritation at site where injected -loss of appetite -nausea, vomiting -stomach upset -trouble sleeping -trouble swallowing -weak or tired This list may not describe all possible side effects. Call your doctor for medical advice about side effects. You may report side effects to FDA at 1-800-FDA-1088. Where should I keep my medicine? This drug is given in a hospital or clinic and will not be stored at home. NOTE: This sheet is a summary. It may not cover all possible information. If you have questions about this medicine, talk to your doctor, pharmacist, or health  care provider.  2015, [employer]vier/Gold Standard. (2012-06-24 13:03:13)

## 2014-04-05 NOTE — Progress Notes (Signed)
Acquainted with Ms Chavana from multiple myeloma support group yesterday.  Provided spiritual and emotional support to her this afternoon at her initiation.  Served as a witness to her story as she processed some spiritual questions that arose from a book that a friend recommended.  Provided emotional support and pastoral reflection as she sought help with the continued impact of past stressors; consulted counseling intern Martinique Austin, who spoke with pt personally, for referral resources.  Pt also shared that she drinks "too much" (her words) and has "high tolerance," "so that's another problem."  Again, in her words:  "A bottle of wine is companionship and relaxation."  Affirmed her self-awareness as a strength, encouraging that a counselor may also be helpful in supporting work to meet the needs for companionship and stress relief in ways that feel better to her.  Pt verbalized appreciation and interest in particular counseling modalities.  She is aware of ongoing chaplain availability for support.  [place], [place]

## 2014-04-10 ENCOUNTER — Other Ambulatory Visit: Payer: Self-pay | Admitting: Hematology and Oncology

## 2014-04-12 DIAGNOSIS — L509 Urticaria, unspecified: Secondary | ICD-10-CM | POA: Diagnosis not present

## 2014-04-19 ENCOUNTER — Ambulatory Visit (HOSPITAL_BASED_OUTPATIENT_CLINIC_OR_DEPARTMENT_OTHER): Payer: Medicare Other

## 2014-04-19 ENCOUNTER — Other Ambulatory Visit (HOSPITAL_BASED_OUTPATIENT_CLINIC_OR_DEPARTMENT_OTHER): Payer: Medicare Other

## 2014-04-19 DIAGNOSIS — C9001 Multiple myeloma in remission: Secondary | ICD-10-CM

## 2014-04-19 DIAGNOSIS — Z5112 Encounter for antineoplastic immunotherapy: Secondary | ICD-10-CM

## 2014-04-19 LAB — CBC WITH DIFFERENTIAL/PLATELET
BASO%: 0.8 % (ref 0.0–2.0)
BASOS ABS: 0 10*3/uL (ref 0.0–0.1)
EOS%: 5.1 % (ref 0.0–7.0)
Eosinophils Absolute: 0.2 10*3/uL (ref 0.0–0.5)
HCT: 35.1 % (ref 34.8–46.6)
HGB: 11.1 g/dL — ABNORMAL LOW (ref 11.6–15.9)
LYMPH#: 0.8 10*3/uL — AB (ref 0.9–3.3)
LYMPH%: 22.3 % (ref 14.0–49.7)
MCH: 31.1 pg (ref 25.1–34.0)
MCHC: 31.7 g/dL (ref 31.5–36.0)
MCV: 98.3 fL (ref 79.5–101.0)
MONO#: 0.3 10*3/uL (ref 0.1–0.9)
MONO%: 9 % (ref 0.0–14.0)
NEUT#: 2.4 10*3/uL (ref 1.5–6.5)
NEUT%: 62.8 % (ref 38.4–76.8)
Platelets: 210 10*3/uL (ref 145–400)
RBC: 3.57 10*6/uL — AB (ref 3.70–5.45)
RDW: 16 % — ABNORMAL HIGH (ref 11.2–14.5)
WBC: 3.8 10*3/uL — AB (ref 3.9–10.3)

## 2014-04-19 LAB — COMPREHENSIVE METABOLIC PANEL (CC13)
ALT: 27 U/L (ref 0–55)
AST: 21 U/L (ref 5–34)
Albumin: 3.9 g/dL (ref 3.5–5.0)
Alkaline Phosphatase: 86 U/L (ref 40–150)
Anion Gap: 13 mEq/L — ABNORMAL HIGH (ref 3–11)
BUN: 17.1 mg/dL (ref 7.0–26.0)
CALCIUM: 9.6 mg/dL (ref 8.4–10.4)
CHLORIDE: 107 meq/L (ref 98–109)
CO2: 21 mEq/L — ABNORMAL LOW (ref 22–29)
CREATININE: 1 mg/dL (ref 0.6–1.1)
EGFR: 59 mL/min/{1.73_m2} — ABNORMAL LOW (ref >90)
Glucose: 145 mg/dl — ABNORMAL HIGH (ref 70–140)
Potassium: 4.5 mEq/L (ref 3.5–5.1)
Sodium: 142 mEq/L (ref 136–145)
Total Bilirubin: 0.41 mg/dL (ref 0.20–1.20)
Total Protein: 6.9 g/dL (ref 6.4–8.3)

## 2014-04-19 MED ORDER — ONDANSETRON HCL 8 MG PO TABS
8.0000 mg | ORAL_TABLET | Freq: Once | ORAL | Status: AC
  Administered 2014-04-19: 8 mg via ORAL

## 2014-04-19 MED ORDER — SODIUM CHLORIDE 0.9 % IV SOLN
Freq: Once | INTRAVENOUS | Status: AC
  Administered 2014-04-19: 13:00:00 via INTRAVENOUS

## 2014-04-19 MED ORDER — BORTEZOMIB CHEMO SQ INJECTION 3.5 MG (2.5MG/ML)
1.3000 mg/m2 | Freq: Once | INTRAMUSCULAR | Status: AC
  Administered 2014-04-19: 2.75 mg via SUBCUTANEOUS
  Filled 2014-04-19: qty 2.75

## 2014-04-19 MED ORDER — ZOLEDRONIC ACID 4 MG/100ML IV SOLN
4.0000 mg | Freq: Once | INTRAVENOUS | Status: AC
  Administered 2014-04-19: 4 mg via INTRAVENOUS
  Filled 2014-04-19: qty 100

## 2014-04-19 MED ORDER — ONDANSETRON HCL 8 MG PO TABS
ORAL_TABLET | ORAL | Status: AC
  Filled 2014-04-19: qty 1

## 2014-04-19 NOTE — Patient Instructions (Signed)
[facility] Discharge Instructions for Patients Receiving Chemotherapy  Today you received the following chemotherapy agents Velcade. To help prevent nausea and vomiting after your treatment, we encourage you to take your nausea medication as directed.  If you develop nausea and vomiting that is not controlled by your nausea medication, call the clinic.   BELOW ARE SYMPTOMS THAT SHOULD BE REPORTED IMMEDIATELY:  *FEVER GREATER THAN 100.5 F  *CHILLS WITH OR WITHOUT FEVER  NAUSEA AND VOMITING THAT IS NOT CONTROLLED WITH YOUR NAUSEA MEDICATION  *UNUSUAL SHORTNESS OF BREATH  *UNUSUAL BRUISING OR BLEEDING  TENDERNESS IN MOUTH AND THROAT WITH OR WITHOUT PRESENCE OF ULCERS  *URINARY PROBLEMS  *BOWEL PROBLEMS  UNUSUAL RASH Items with * indicate a potential emergency and should be followed up as soon as possible.  Feel free to call the clinic you have any questions or concerns. The clinic phone number is [phone].  Please show the CHEMO ALERT CARD at check-in to the Emergency Department and triage nurse.    

## 2014-04-21 LAB — SPEP & IFE WITH QIG
ALBUMIN ELP: 4 g/dL (ref 3.8–4.8)
ALPHA-1-GLOBULIN: 0.3 g/dL (ref 0.2–0.3)
ALPHA-2-GLOBULIN: 0.7 g/dL (ref 0.5–0.9)
Abnormal Protein Band1: 0.2 g/dL
BETA 2: 0.5 g/dL (ref 0.2–0.5)
BETA GLOBULIN: 0.5 g/dL (ref 0.4–0.6)
GAMMA GLOBULIN: 0.5 g/dL — AB (ref 0.8–1.7)
IgA: 72 mg/dL (ref 69–380)
IgG (Immunoglobin G), Serum: 746 mg/dL (ref 690–1700)
IgM, Serum: 31 mg/dL — ABNORMAL LOW (ref 52–322)
Total Protein, Serum Electrophoresis: 6.5 g/dL (ref 6.1–8.1)

## 2014-04-21 LAB — KAPPA/LAMBDA LIGHT CHAINS
KAPPA FREE LGHT CHN: 1.16 mg/dL (ref 0.33–1.94)
KAPPA LAMBDA RATIO: 3.87 — AB (ref 0.26–1.65)
Lambda Free Lght Chn: 0.3 mg/dL — ABNORMAL LOW (ref 0.57–2.63)

## 2014-04-25 ENCOUNTER — Other Ambulatory Visit: Payer: Self-pay | Admitting: Hematology and Oncology

## 2014-05-03 ENCOUNTER — Ambulatory Visit (HOSPITAL_BASED_OUTPATIENT_CLINIC_OR_DEPARTMENT_OTHER): Payer: Medicare Other

## 2014-05-03 ENCOUNTER — Ambulatory Visit (HOSPITAL_BASED_OUTPATIENT_CLINIC_OR_DEPARTMENT_OTHER): Payer: Medicare Other | Admitting: Hematology and Oncology

## 2014-05-03 ENCOUNTER — Other Ambulatory Visit (HOSPITAL_BASED_OUTPATIENT_CLINIC_OR_DEPARTMENT_OTHER): Payer: Medicare Other

## 2014-05-03 ENCOUNTER — Telehealth: Payer: Self-pay | Admitting: Hematology and Oncology

## 2014-05-03 ENCOUNTER — Encounter: Payer: Self-pay | Admitting: Hematology and Oncology

## 2014-05-03 VITALS — BP 129/55 | HR 80 | Temp 97.8°F | Resp 18 | Ht 65.0 in | Wt 204.8 lb

## 2014-05-03 DIAGNOSIS — C9001 Multiple myeloma in remission: Secondary | ICD-10-CM | POA: Diagnosis not present

## 2014-05-03 DIAGNOSIS — G620 Drug-induced polyneuropathy: Secondary | ICD-10-CM | POA: Diagnosis not present

## 2014-05-03 DIAGNOSIS — R21 Rash and other nonspecific skin eruption: Secondary | ICD-10-CM | POA: Diagnosis not present

## 2014-05-03 DIAGNOSIS — T451X5A Adverse effect of antineoplastic and immunosuppressive drugs, initial encounter: Secondary | ICD-10-CM | POA: Insufficient documentation

## 2014-05-03 DIAGNOSIS — D630 Anemia in neoplastic disease: Secondary | ICD-10-CM | POA: Diagnosis not present

## 2014-05-03 DIAGNOSIS — Z5112 Encounter for antineoplastic immunotherapy: Secondary | ICD-10-CM | POA: Diagnosis present

## 2014-05-03 LAB — COMPREHENSIVE METABOLIC PANEL (CC13)
ALT: 32 U/L (ref 0–55)
ANION GAP: 13 meq/L — AB (ref 3–11)
AST: 24 U/L (ref 5–34)
Albumin: 3.8 g/dL (ref 3.5–5.0)
Alkaline Phosphatase: 89 U/L (ref 40–150)
BUN: 19.7 mg/dL (ref 7.0–26.0)
CALCIUM: 9.3 mg/dL (ref 8.4–10.4)
CO2: 24 meq/L (ref 22–29)
CREATININE: 1.1 mg/dL (ref 0.6–1.1)
Chloride: 105 mEq/L (ref 98–109)
EGFR: 54 mL/min/{1.73_m2} — ABNORMAL LOW (ref >90)
Glucose: 157 mg/dl — ABNORMAL HIGH (ref 70–140)
Potassium: 4.2 mEq/L (ref 3.5–5.1)
Sodium: 141 mEq/L (ref 136–145)
Total Bilirubin: 0.35 mg/dL (ref 0.20–1.20)
Total Protein: 6.6 g/dL (ref 6.4–8.3)

## 2014-05-03 LAB — CBC WITH DIFFERENTIAL/PLATELET
BASO%: 0.4 % (ref 0.0–2.0)
Basophils Absolute: 0 10*3/uL (ref 0.0–0.1)
EOS%: 3.5 % (ref 0.0–7.0)
Eosinophils Absolute: 0.2 10*3/uL (ref 0.0–0.5)
HCT: 33.2 % — ABNORMAL LOW (ref 34.8–46.6)
HGB: 10.7 g/dL — ABNORMAL LOW (ref 11.6–15.9)
LYMPH%: 14.6 % (ref 14.0–49.7)
MCH: 31.7 pg (ref 25.1–34.0)
MCHC: 32.1 g/dL (ref 31.5–36.0)
MCV: 98.8 fL (ref 79.5–101.0)
MONO#: 0.4 10*3/uL (ref 0.1–0.9)
MONO%: 7.3 % (ref 0.0–14.0)
NEUT%: 74.2 % (ref 38.4–76.8)
NEUTROS ABS: 4.4 10*3/uL (ref 1.5–6.5)
Platelets: 192 10*3/uL (ref 145–400)
RBC: 3.36 10*6/uL — AB (ref 3.70–5.45)
RDW: 15.9 % — ABNORMAL HIGH (ref 11.2–14.5)
WBC: 6 10*3/uL (ref 3.9–10.3)
lymph#: 0.9 10*3/uL (ref 0.9–3.3)

## 2014-05-03 MED ORDER — ONDANSETRON HCL 8 MG PO TABS
8.0000 mg | ORAL_TABLET | Freq: Once | ORAL | Status: AC
  Administered 2014-05-03: 8 mg via ORAL

## 2014-05-03 MED ORDER — BORTEZOMIB CHEMO SQ INJECTION 3.5 MG (2.5MG/ML)
1.3000 mg/m2 | Freq: Once | INTRAMUSCULAR | Status: AC
  Administered 2014-05-03: 2.75 mg via SUBCUTANEOUS
  Filled 2014-05-03: qty 2.75

## 2014-05-03 MED ORDER — ONDANSETRON HCL 8 MG PO TABS
ORAL_TABLET | ORAL | Status: AC
  Filled 2014-05-03: qty 1

## 2014-05-03 NOTE — Assessment & Plan Note (Signed)
I am 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 This is only mild, grade 1. Recommend close observation. She will continue Velcade every other week for 2 years per recommendation from the transplant physician. Her recent blood work showed detectable M spike which is not a surprise. I recommend treatment unless her blood work continued to get worse.  I will recheck blood work again next month. She will continue treatment for now along with Zometa monthly.

## 2014-05-03 NOTE — Assessment & Plan Note (Signed)
She has mild skin rash consistent with a mild reaction to Velcade. There were no evidence of cellulitis. Continue close observation.

## 2014-05-03 NOTE — Telephone Encounter (Signed)
added appt per pof....pt will get sched in tx.

## 2014-05-03 NOTE — Assessment & Plan Note (Signed)
This is only mild grade 1. Continue close observation for side effects.

## 2014-05-03 NOTE — Progress Notes (Signed)
[facility] OFFICE PROGRESS NOTE  Patient Care Team: Maurice Small, MD as PCP - General (Family Medicine) Laurence Spates, MD as Attending Physician (Gastroenterology) Jerrell Belfast, MD as Attending Physician (Otolaryngology) Collene Gobble, MD as Attending Physician (Pulmonary Disease)  SUMMARY OF ONCOLOGIC HISTORY:   Multiple myeloma in remission   05/27/2012 Initial Diagnosis Multiple myeloma, without mention of having achieved remission(203.00)   05/27/2012 Cancer Staging 1.  IgG kappa multiple myeloma. She presented in 05/2012 with anemia, Cr 0.8, Ca 9.6; no bone lytic lesion; LDH 140; beta2- microglobulin 1.86 (ref 1.01-1.73). Positive SPEP for M-spike at 1.69, IgG 2080, kappa 7.24, kappa:lambda ratio 24.97; UPEP positive   06/08/2012 Bone Marrow Biopsy Bone marrow biopsy on 06/08/12 showed 19% plasma cell; normal cytogenetics; myeloma FISH pending.    06/14/2012 Imaging Skeletal survey negative for bone lytic lesions.  Lung nodule apparent.   09/17/2012 Bone Marrow Biopsy Bone marrow biopsy showed 11% plasma cells.   10/18/2012 - 12/31/2012 Chemotherapy Started on VRD (Velcade 1.3 mg/m^2 [place] days 1,8 and 15; Revlimide 25 mg PO daily, on day 1 through 14; Decadron 40 mg PO Days 1, 8, 15).   Started on coumadin given RD.   01/14/2013 Bone Marrow Biopsy The bone marrow clot sections are normocellular toslightly hypercellular for age (approximately 40% overall). 1% plasma cells.   02/08/2013 Bone Marrow Transplant Auto Transplant at [facility] (Dr. Glorianne Manchester McIver).   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    07/14/2012 Imaging  1. Right lower lobe nodule is non hypermetabolic which suggests benign etiology considering the slow growing rate in 6 years or non FDG avid disease as seen with carcinoid tumors. Pending biopsy. No PET evidence of mediastinal nodal disease.    08/02/2012 Imaging MRI of brain negative.   08/12/2012 Surgery Robotic Lobectomy of R Lower lobe.    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     Surgical resection margins are negative.     Two benign lymph nodes.     pTa1N0    INTERVAL HISTORY: Please see below for problem oriented charting. She is seen prior to chemotherapy. She had mild skin reaction to injection. Very mild peripheral neuropathy, not worse. Denies recent infection.  REVIEW OF SYSTEMS:   Constitutional: Denies fevers, chills or abnormal weight loss Eyes: Denies blurriness of vision Ears, nose, mouth, throat, and face: Denies mucositis or sore throat Respiratory: Denies cough, dyspnea or wheezes Cardiovascular: Denies palpitation, chest discomfort or lower extremity swelling Gastrointestinal:  Denies nausea, heartburn or change in bowel habits Lymphatics: Denies new lymphadenopathy or easy bruising Neurological:Denies numbness, tingling or new weaknesses Behavioral/Psych: Mood is stable, no new changes  All other systems were reviewed with the patient and are negative.  I have reviewed the past medical history, past surgical history, social history and family history with the patient and they are unchanged from previous note.  ALLERGIES:  has No Known Allergies.  MEDICATIONS:  Current Outpatient Prescriptions  Medication Sig Dispense Refill  . acyclovir (ZOVIRAX) 400 MG tablet Take 1 tablet (400 mg total)  by mouth daily. 90 tablet 3  . buPROPion (WELLBUTRIN XL) 300 MG 24 hr tablet Take 300 mg by mouth daily.    . cholecalciferol (VITAMIN D) 1000 UNITS tablet Take 2,000 Units by mouth daily.     Marland Kitchen esomeprazole (NEXIUM) 40 MG capsule Take 1 capsule (40 mg total) by mouth 2 (two) times daily. (Patient taking differently: Take 40 mg by mouth daily. ) 30 capsule 0  . losartan (COZAAR) 50 MG tablet Take 50 mg by mouth daily.    . magnesium chloride (SLOW-MAG) 64 MG TBEC SR tablet Take 1 tablet by mouth 2 (two) times daily.     . metFORMIN (GLUCOPHAGE) 500 MG tablet TAKE 1 TABLET (500 MG TOTAL) BY MOUTH 2 (TWO) TIMES DAILY WITH A MEAL. 60 tablet 1  . metoprolol succinate (TOPROL-XL) 50 MG 24 hr tablet Take 50 mg by mouth daily. Take with or immediately following a meal.    . Multiple Vitamins-Minerals (MULTIVITAMIN WITH MINERALS) tablet Take 1 tablet by mouth at bedtime.     . Multiple Vitamins-Minerals (PRESERVISION AREDS 2) CAPS Take 1 capsule by mouth 2 (two) times daily.    . naproxen sodium (ANAPROX) 220 MG tablet Take 220 mg by mouth 2 (two) times daily as needed (pain).    Marland Kitchen sertraline (ZOLOFT) 100 MG tablet Take 100 mg by mouth daily.     . simvastatin (ZOCOR) 40 MG tablet Take 40 mg by mouth at bedtime.     . triamcinolone (NASACORT) 55 MCG/ACT AERO nasal inhaler Place 2 sprays into the nose daily. 1 Inhaler 12   No current facility-administered medications for this visit.    PHYSICAL EXAMINATION: ECOG PERFORMANCE STATUS: 0 - Asymptomatic  Filed Vitals:   05/03/14 1107  BP: 129/55  Pulse: 80  Temp: 97.8 F (36.6 C)  Resp: 18   Filed Weights   05/03/14 1107  Weight: 204 lb 12.8 oz (92.897 kg)    GENERAL:alert, no distress and comfortable SKIN: skin color, texture, turgor are normal, no rashes or significant lesions. She had mild local skin reaction to Velcade but no cellulitis. EYES: normal, Conjunctiva are pink and non-injected, sclera clear OROPHARYNX:no exudate, no erythema and lips, buccal mucosa, and tongue normal  NECK: supple, thyroid normal size, non-tender, without nodularity LYMPH:  no palpable lymphadenopathy in the cervical, axillary or inguinal LUNGS:  clear to auscultation and percussion with normal breathing effort HEART: regular rate & rhythm and no murmurs and no lower extremity edema ABDOMEN:abdomen soft, non-tender and normal bowel sounds Musculoskeletal:no cyanosis of digits and no clubbing  NEURO: alert & oriented x 3 with fluent speech, no focal motor/sensory deficits  LABORATORY DATA:  I have reviewed the data as listed    Component Value Date/Time   NA 141 05/03/2014 1056   NA 141 03/15/2014 1741   K 4.2 05/03/2014 1056   K 4.5 03/15/2014 1741   CL 107 03/15/2014 1741   CL 105 06/08/2012 0913   CO2 24 05/03/2014 1056   CO2 29 10/10/2013 0726   GLUCOSE 157* 05/03/2014 1056   GLUCOSE 106* 03/15/2014 1741   GLUCOSE 120* 06/08/2012 0913   BUN 19.7 05/03/2014 1056   BUN 24* 03/15/2014 1741   CREATININE 1.1 05/03/2014 1056   CREATININE 1.00 03/15/2014 1741   CALCIUM 9.3 05/03/2014 1056   CALCIUM 9.1 10/10/2013 0726   PROT 6.6 05/03/2014 1056   PROT 5.9* 10/08/2013 0927   ALBUMIN 3.8 05/03/2014 1056   ALBUMIN 2.2* 10/08/2013 0927   AST 24 05/03/2014  1056   AST 36 10/08/2013 0927   ALT 32 05/03/2014 1056   ALT 46* 10/08/2013 0927   ALKPHOS 89 05/03/2014 1056   ALKPHOS 178* 10/08/2013 0927   BILITOT 0.35 05/03/2014 1056   BILITOT 0.4 10/08/2013 0927   GFRNONAA 87* 10/10/2013 0726   GFRAA >90 10/10/2013 0726    No results found for: SPEP, UPEP  Lab Results  Component Value Date   WBC 6.0 05/03/2014   NEUTROABS 4.4 05/03/2014   HGB 10.7* 05/03/2014   HCT 33.2* 05/03/2014   MCV 98.8 05/03/2014   PLT 192 05/03/2014      Chemistry      Component Value Date/Time   NA 141 05/03/2014 1056   NA 141 03/15/2014 1741   K 4.2 05/03/2014 1056   K 4.5 03/15/2014 1741   CL 107 03/15/2014 1741   CL 105 06/08/2012 0913   CO2 24 05/03/2014 1056   CO2 29 10/10/2013 0726   BUN 19.7 05/03/2014 1056   BUN 24* 03/15/2014 1741   CREATININE 1.1 05/03/2014 1056   CREATININE 1.00 03/15/2014 1741      Component Value  Date/Time   CALCIUM 9.3 05/03/2014 1056   CALCIUM 9.1 10/10/2013 0726   ALKPHOS 89 05/03/2014 1056   ALKPHOS 178* 10/08/2013 0927   AST 24 05/03/2014 1056   AST 36 10/08/2013 0927   ALT 32 05/03/2014 1056   ALT 46* 10/08/2013 0927   BILITOT 0.35 05/03/2014 1056   BILITOT 0.4 10/08/2013 0927      ASSESSMENT & PLAN:  Multiple myeloma in remission  I am 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 This is only mild, grade 1. Recommend close observation. She will continue Velcade every other week for 2 years per recommendation from the transplant physician. Her recent blood work showed detectable M spike which is not a surprise. I recommend treatment unless her blood work continued to get worse.  I will recheck blood work again next month. She will continue treatment for now along with Zometa monthly.    Anemia in neoplastic disease This is likely anemia of chronic disease. The patient denies recent history of bleeding such as epistaxis, hematuria or hematochezia. She is asymptomatic from the anemia. We will observe for now.  She does not require transfusion now.     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 Continue close observation for side effects.   Rash She has mild skin rash consistent with a mild reaction to Velcade. There were no evidence of cellulitis. Continue close observation.    Orders Placed This Encounter  Procedures  . SPEP & IFE with QIG    Standing Status: Future     Number of Occurrences:      Standing Expiration Date: 06/07/2015  . Kappa/lambda light chains    Standing Status: Future     Number of Occurrences:      Standing Expiration Date: 06/07/2015  . Beta 2 microglobulin, serum    Standing Status: Future     Number of Occurrences:      Standing Expiration Date: 06/07/2015   All questions were answered. The patient knows to call the clinic with any problems, questions or concerns. No barriers to learning was  detected. I spent 25 minutes counseling the patient face to face. The total time spent in the appointment was 30 minutes and more than 50% was on counseling and review of test results     [facility], [place], MD 05/03/2014 11:48 AM

## 2014-05-03 NOTE — Assessment & Plan Note (Signed)
This is likely anemia of chronic disease. The patient denies recent history of bleeding such as epistaxis, hematuria or hematochezia. She is asymptomatic from the anemia. We will observe for now.  She does not require transfusion now.   

## 2014-05-03 NOTE — Patient Instructions (Signed)
[facility] Discharge Instructions for Patients Receiving Chemotherapy  Today you received the following chemotherapy agents Velcade  To help prevent nausea and vomiting after your treatment, we encourage you to take your nausea medication     If you develop nausea and vomiting that is not controlled by your nausea medication, call the clinic.   BELOW ARE SYMPTOMS THAT SHOULD BE REPORTED IMMEDIATELY:  *FEVER GREATER THAN 100.5 F  *CHILLS WITH OR WITHOUT FEVER  NAUSEA AND VOMITING THAT IS NOT CONTROLLED WITH YOUR NAUSEA MEDICATION  *UNUSUAL SHORTNESS OF BREATH  *UNUSUAL BRUISING OR BLEEDING  TENDERNESS IN MOUTH AND THROAT WITH OR WITHOUT PRESENCE OF ULCERS  *URINARY PROBLEMS  *BOWEL PROBLEMS  UNUSUAL RASH Items with * indicate a potential emergency and should be followed up as soon as possible.  Feel free to call the clinic you have any questions or concerns. The clinic phone number is [phone].  Please show the [place] at check-in to the Emergency Department and triage nurse.

## 2014-05-17 ENCOUNTER — Ambulatory Visit (HOSPITAL_BASED_OUTPATIENT_CLINIC_OR_DEPARTMENT_OTHER): Payer: Medicare Other

## 2014-05-17 ENCOUNTER — Other Ambulatory Visit (HOSPITAL_BASED_OUTPATIENT_CLINIC_OR_DEPARTMENT_OTHER): Payer: Medicare Other

## 2014-05-17 VITALS — BP 150/64 | HR 72 | Temp 98.2°F | Resp 18

## 2014-05-17 DIAGNOSIS — Z5112 Encounter for antineoplastic immunotherapy: Secondary | ICD-10-CM | POA: Diagnosis present

## 2014-05-17 DIAGNOSIS — C9001 Multiple myeloma in remission: Secondary | ICD-10-CM | POA: Diagnosis present

## 2014-05-17 LAB — CBC WITH DIFFERENTIAL/PLATELET
BASO%: 0.3 % (ref 0.0–2.0)
Basophils Absolute: 0 10*3/uL (ref 0.0–0.1)
EOS%: 3.3 % (ref 0.0–7.0)
Eosinophils Absolute: 0.1 10*3/uL (ref 0.0–0.5)
HEMATOCRIT: 33 % — AB (ref 34.8–46.6)
HGB: 10.6 g/dL — ABNORMAL LOW (ref 11.6–15.9)
LYMPH%: 18.7 % (ref 14.0–49.7)
MCH: 31.9 pg (ref 25.1–34.0)
MCHC: 32.1 g/dL (ref 31.5–36.0)
MCV: 99.4 fL (ref 79.5–101.0)
MONO#: 0.3 10*3/uL (ref 0.1–0.9)
MONO%: 8.2 % (ref 0.0–14.0)
NEUT#: 2.3 10*3/uL (ref 1.5–6.5)
NEUT%: 69.5 % (ref 38.4–76.8)
PLATELETS: 162 10*3/uL (ref 145–400)
RBC: 3.32 10*6/uL — AB (ref 3.70–5.45)
RDW: 15 % — ABNORMAL HIGH (ref 11.2–14.5)
WBC: 3.3 10*3/uL — ABNORMAL LOW (ref 3.9–10.3)
lymph#: 0.6 10*3/uL — ABNORMAL LOW (ref 0.9–3.3)

## 2014-05-17 LAB — COMPREHENSIVE METABOLIC PANEL (CC13)
ALK PHOS: 89 U/L (ref 40–150)
ALT: 32 U/L (ref 0–55)
AST: 24 U/L (ref 5–34)
Albumin: 3.7 g/dL (ref 3.5–5.0)
Anion Gap: 15 mEq/L — ABNORMAL HIGH (ref 3–11)
BUN: 20.9 mg/dL (ref 7.0–26.0)
CO2: 23 mEq/L (ref 22–29)
Calcium: 9.1 mg/dL (ref 8.4–10.4)
Chloride: 105 mEq/L (ref 98–109)
Creatinine: 0.9 mg/dL (ref 0.6–1.1)
EGFR: 71 mL/min/{1.73_m2} — AB (ref >90)
GLUCOSE: 136 mg/dL (ref 70–140)
Potassium: 4.6 mEq/L (ref 3.5–5.1)
Sodium: 143 mEq/L (ref 136–145)
TOTAL PROTEIN: 6.4 g/dL (ref 6.4–8.3)
Total Bilirubin: 0.29 mg/dL (ref 0.20–1.20)

## 2014-05-17 MED ORDER — ONDANSETRON HCL 8 MG PO TABS
8.0000 mg | ORAL_TABLET | Freq: Once | ORAL | Status: AC
  Administered 2014-05-17: 8 mg via ORAL

## 2014-05-17 MED ORDER — ZOLEDRONIC ACID 4 MG/100ML IV SOLN
4.0000 mg | Freq: Once | INTRAVENOUS | Status: AC
  Administered 2014-05-17: 4 mg via INTRAVENOUS
  Filled 2014-05-17: qty 100

## 2014-05-17 MED ORDER — BORTEZOMIB CHEMO SQ INJECTION 3.5 MG (2.5MG/ML)
1.3000 mg/m2 | Freq: Once | INTRAMUSCULAR | Status: AC
  Administered 2014-05-17: 2.75 mg via SUBCUTANEOUS
  Filled 2014-05-17: qty 2.75

## 2014-05-17 MED ORDER — ONDANSETRON HCL 8 MG PO TABS
ORAL_TABLET | ORAL | Status: AC
  Filled 2014-05-17: qty 1

## 2014-05-17 MED ORDER — SODIUM CHLORIDE 0.9 % IV SOLN
Freq: Once | INTRAVENOUS | Status: AC
  Administered 2014-05-17: 12:00:00 via INTRAVENOUS

## 2014-05-17 NOTE — Patient Instructions (Signed)
[facility] Discharge Instructions for Patients Receiving Chemotherapy  Today you received the following chemotherapy agent: Velcade   To help prevent nausea and vomiting after your treatment, we encourage you to take your nausea medication as prescribed.    If you develop nausea and vomiting that is not controlled by your nausea medication, call the clinic.   BELOW ARE SYMPTOMS THAT SHOULD BE REPORTED IMMEDIATELY:  *FEVER GREATER THAN 100.5 F  *CHILLS WITH OR WITHOUT FEVER  NAUSEA AND VOMITING THAT IS NOT CONTROLLED WITH YOUR NAUSEA MEDICATION  *UNUSUAL SHORTNESS OF BREATH  *UNUSUAL BRUISING OR BLEEDING  TENDERNESS IN MOUTH AND THROAT WITH OR WITHOUT PRESENCE OF ULCERS  *URINARY PROBLEMS  *BOWEL PROBLEMS  UNUSUAL RASH Items with * indicate a potential emergency and should be followed up as soon as possible.  Feel free to call the clinic you have any questions or concerns. The clinic phone number is [phone].  Please show the [place] at check-in to the Emergency Department and triage nurse.  Zoledronic Acid injection (Hypercalcemia, Oncology) What is this medicine? ZOLEDRONIC ACID (ZOE le dron ik AS id) lowers the amount of calcium loss from bone. It is used to treat too much calcium in your blood from cancer. It is also used to prevent complications of cancer that has spread to the bone. This medicine may be used for other purposes; ask your health care provider or pharmacist if you have questions. COMMON BRAND NAME(S): Zometa What should I tell my health care provider before I take this medicine? They need to know if you have any of these conditions: -aspirin-sensitive asthma -cancer, especially if you are receiving medicines used to treat cancer -dental disease or wear dentures -infection -kidney disease -receiving corticosteroids like dexamethasone or prednisone -an unusual or allergic reaction to zoledronic acid, other medicines, foods,  dyes, or preservatives -pregnant or trying to get pregnant -breast-feeding How should I use this medicine? This medicine is for infusion into a vein. It is given by a health care professional in a hospital or clinic setting. Talk to your pediatrician regarding the use of this medicine in children. Special care may be needed. Overdosage: If you think you have taken too much of this medicine contact a poison control center or emergency room at once. NOTE: This medicine is only for you. Do not share this medicine with others. What if I miss a dose? It is important not to miss your dose. Call your doctor or health care professional if you are unable to keep an appointment. What may interact with this medicine? -certain antibiotics given by injection -NSAIDs, medicines for pain and inflammation, like ibuprofen or naproxen -some diuretics like bumetanide, furosemide -teriparatide -thalidomide This list may not describe all possible interactions. Give your health care provider a list of all the medicines, herbs, non-prescription drugs, or dietary supplements you use. Also tell them if you smoke, drink alcohol, or use illegal drugs. Some items may interact with your medicine. What should I watch for while using this medicine? Visit your doctor or health care professional for regular checkups. It may be some time before you see the benefit from this medicine. Do not stop taking your medicine unless your doctor tells you to. Your doctor may order blood tests or other tests to see how you are doing. Women should inform their doctor if they wish to become pregnant or think they might be pregnant. There is a potential for serious side effects to an unborn child. Talk  to your health care professional or pharmacist for more information. You should make sure that you get enough calcium and vitamin D while you are taking this medicine. Discuss the foods you eat and the vitamins you take with your health care  professional. Some people who take this medicine have severe bone, joint, and/or muscle pain. This medicine may also increase your risk for jaw problems or a broken thigh bone. Tell your doctor right away if you have severe pain in your jaw, bones, joints, or muscles. Tell your doctor if you have any pain that does not go away or that gets worse. Tell your dentist and dental surgeon that you are taking this medicine. You should not have major dental surgery while on this medicine. See your dentist to have a dental exam and fix any dental problems before starting this medicine. Take good care of your teeth while on this medicine. Make sure you see your dentist for regular follow-up appointments. What side effects may I notice from receiving this medicine? Side effects that you should report to your doctor or health care professional as soon as possible: -allergic reactions like skin rash, itching or hives, swelling of the face, lips, or tongue -anxiety, confusion, or depression -breathing problems -changes in vision -eye pain -feeling faint or lightheaded, falls -jaw pain, especially after dental work -mouth sores -muscle cramps, stiffness, or weakness -trouble passing urine or change in the amount of urine Side effects that usually do not require medical attention (report to your doctor or health care professional if they continue or are bothersome): -bone, joint, or muscle pain -constipation -diarrhea -fever -hair loss -irritation at site where injected -loss of appetite -nausea, vomiting -stomach upset -trouble sleeping -trouble swallowing -weak or tired This list may not describe all possible side effects. Call your doctor for medical advice about side effects. You may report side effects to FDA at 1-800-FDA-1088. Where should I keep my medicine? This drug is given in a hospital or clinic and will not be stored at home. NOTE: This sheet is a summary. It may not cover all possible  information. If you have questions about this medicine, talk to your doctor, pharmacist, or health care provider.  2015, [employer]vier/Gold Standard. (2012-06-24 13:03:13)

## 2014-05-19 DIAGNOSIS — Z09 Encounter for follow-up examination after completed treatment for conditions other than malignant neoplasm: Secondary | ICD-10-CM | POA: Diagnosis not present

## 2014-05-19 DIAGNOSIS — C9000 Multiple myeloma not having achieved remission: Secondary | ICD-10-CM | POA: Diagnosis not present

## 2014-05-19 DIAGNOSIS — C3431 Malignant neoplasm of lower lobe, right bronchus or lung: Secondary | ICD-10-CM | POA: Diagnosis not present

## 2014-05-19 DIAGNOSIS — Z483 Aftercare following surgery for neoplasm: Secondary | ICD-10-CM | POA: Diagnosis not present

## 2014-05-19 DIAGNOSIS — Z85858 Personal history of malignant neoplasm of other endocrine glands: Secondary | ICD-10-CM | POA: Diagnosis not present

## 2014-05-19 DIAGNOSIS — R918 Other nonspecific abnormal finding of lung field: Secondary | ICD-10-CM | POA: Diagnosis not present

## 2014-05-19 DIAGNOSIS — Z9889 Other specified postprocedural states: Secondary | ICD-10-CM | POA: Diagnosis not present

## 2014-05-31 ENCOUNTER — Other Ambulatory Visit (HOSPITAL_BASED_OUTPATIENT_CLINIC_OR_DEPARTMENT_OTHER): Payer: Medicare Other

## 2014-05-31 ENCOUNTER — Ambulatory Visit (HOSPITAL_BASED_OUTPATIENT_CLINIC_OR_DEPARTMENT_OTHER): Payer: Medicare Other

## 2014-05-31 VITALS — BP 132/66 | HR 77 | Resp 20

## 2014-05-31 DIAGNOSIS — C9001 Multiple myeloma in remission: Secondary | ICD-10-CM

## 2014-05-31 DIAGNOSIS — Z5112 Encounter for antineoplastic immunotherapy: Secondary | ICD-10-CM

## 2014-05-31 DIAGNOSIS — Z7901 Long term (current) use of anticoagulants: Secondary | ICD-10-CM | POA: Diagnosis not present

## 2014-05-31 LAB — COMPREHENSIVE METABOLIC PANEL (CC13)
ALBUMIN: 3.6 g/dL (ref 3.5–5.0)
ALT: 41 U/L (ref 0–55)
ANION GAP: 12 meq/L — AB (ref 3–11)
AST: 24 U/L (ref 5–34)
Alkaline Phosphatase: 87 U/L (ref 40–150)
BUN: 19.6 mg/dL (ref 7.0–26.0)
CHLORIDE: 105 meq/L (ref 98–109)
CO2: 24 meq/L (ref 22–29)
Calcium: 9.7 mg/dL (ref 8.4–10.4)
Creatinine: 0.9 mg/dL (ref 0.6–1.1)
EGFR: 63 mL/min/{1.73_m2} — ABNORMAL LOW (ref >90)
GLUCOSE: 127 mg/dL (ref 70–140)
POTASSIUM: 4.2 meq/L (ref 3.5–5.1)
SODIUM: 141 meq/L (ref 136–145)
TOTAL PROTEIN: 6.5 g/dL (ref 6.4–8.3)
Total Bilirubin: 0.47 mg/dL (ref 0.20–1.20)

## 2014-05-31 LAB — CBC WITH DIFFERENTIAL/PLATELET
BASO%: 0.2 % (ref 0.0–2.0)
Basophils Absolute: 0 10*3/uL (ref 0.0–0.1)
EOS ABS: 0.2 10*3/uL (ref 0.0–0.5)
EOS%: 2.9 % (ref 0.0–7.0)
HCT: 33.5 % — ABNORMAL LOW (ref 34.8–46.6)
HGB: 10.8 g/dL — ABNORMAL LOW (ref 11.6–15.9)
LYMPH%: 13.7 % — ABNORMAL LOW (ref 14.0–49.7)
MCH: 32 pg (ref 25.1–34.0)
MCHC: 32.2 g/dL (ref 31.5–36.0)
MCV: 99.1 fL (ref 79.5–101.0)
MONO#: 0.4 10*3/uL (ref 0.1–0.9)
MONO%: 7.8 % (ref 0.0–14.0)
NEUT%: 75.4 % (ref 38.4–76.8)
NEUTROS ABS: 4.1 10*3/uL (ref 1.5–6.5)
Platelets: 142 10*3/uL — ABNORMAL LOW (ref 145–400)
RBC: 3.38 10*6/uL — AB (ref 3.70–5.45)
RDW: 14.7 % — AB (ref 11.2–14.5)
WBC: 5.5 10*3/uL (ref 3.9–10.3)
lymph#: 0.8 10*3/uL — ABNORMAL LOW (ref 0.9–3.3)

## 2014-05-31 MED ORDER — ONDANSETRON HCL 8 MG PO TABS
8.0000 mg | ORAL_TABLET | Freq: Once | ORAL | Status: AC
  Administered 2014-05-31: 8 mg via ORAL

## 2014-05-31 MED ORDER — BORTEZOMIB CHEMO SQ INJECTION 3.5 MG (2.5MG/ML)
1.3000 mg/m2 | Freq: Once | INTRAMUSCULAR | Status: AC
  Administered 2014-05-31: 2.75 mg via SUBCUTANEOUS
  Filled 2014-05-31: qty 2.75

## 2014-05-31 MED ORDER — ONDANSETRON HCL 8 MG PO TABS
ORAL_TABLET | ORAL | Status: AC
  Filled 2014-05-31: qty 1

## 2014-05-31 NOTE — Patient Instructions (Signed)
[facility] Discharge Instructions for Patients Receiving Chemotherapy  Today you received the following chemotherapy agents:  Velcade  To help prevent nausea and vomiting after your treatment, we encourage you to take your nausea medication as prescribed.   If you develop nausea and vomiting that is not controlled by your nausea medication, call the clinic.   BELOW ARE SYMPTOMS THAT SHOULD BE REPORTED IMMEDIATELY:  *FEVER GREATER THAN 100.5 F  *CHILLS WITH OR WITHOUT FEVER  NAUSEA AND VOMITING THAT IS NOT CONTROLLED WITH YOUR NAUSEA MEDICATION  *UNUSUAL SHORTNESS OF BREATH  *UNUSUAL BRUISING OR BLEEDING  TENDERNESS IN MOUTH AND THROAT WITH OR WITHOUT PRESENCE OF ULCERS  *URINARY PROBLEMS  *BOWEL PROBLEMS  UNUSUAL RASH Items with * indicate a potential emergency and should be followed up as soon as possible.  Feel free to call the clinic you have any questions or concerns. The clinic phone number is [phone].  Please show the CHEMO ALERT CARD at check-in to the Emergency Department and triage nurse.   

## 2014-06-02 LAB — KAPPA/LAMBDA LIGHT CHAINS
KAPPA FREE LGHT CHN: 1.17 mg/dL (ref 0.33–1.94)
KAPPA LAMBDA RATIO: 1.31 (ref 0.26–1.65)
Lambda Free Lght Chn: 0.89 mg/dL (ref 0.57–2.63)

## 2014-06-02 LAB — SPEP & IFE WITH QIG
ABNORMAL PROTEIN BAND1: 0.1 g/dL
ALBUMIN ELP: 4 g/dL (ref 3.8–4.8)
ALPHA-2-GLOBULIN: 0.8 g/dL (ref 0.5–0.9)
Alpha-1-Globulin: 0.3 g/dL (ref 0.2–0.3)
Beta 2: 0.3 g/dL (ref 0.2–0.5)
Beta Globulin: 0.4 g/dL (ref 0.4–0.6)
Gamma Globulin: 0.6 g/dL — ABNORMAL LOW (ref 0.8–1.7)
IGG (IMMUNOGLOBIN G), SERUM: 735 mg/dL (ref 690–1700)
IgA: 68 mg/dL — ABNORMAL LOW (ref 69–380)
IgM, Serum: 22 mg/dL — ABNORMAL LOW (ref 52–322)
TOTAL PROTEIN, SERUM ELECTROPHOR: 6.5 g/dL (ref 6.1–8.1)

## 2014-06-02 LAB — BETA 2 MICROGLOBULIN, SERUM: Beta-2 Microglobulin: 3.36 mg/L — ABNORMAL HIGH (ref <=2.51)

## 2014-06-12 ENCOUNTER — Other Ambulatory Visit: Payer: Self-pay | Admitting: Hematology and Oncology

## 2014-06-14 ENCOUNTER — Ambulatory Visit (HOSPITAL_BASED_OUTPATIENT_CLINIC_OR_DEPARTMENT_OTHER): Payer: Medicare Other

## 2014-06-14 ENCOUNTER — Other Ambulatory Visit: Payer: Self-pay | Admitting: Hematology and Oncology

## 2014-06-14 ENCOUNTER — Telehealth: Payer: Self-pay | Admitting: Hematology and Oncology

## 2014-06-14 ENCOUNTER — Ambulatory Visit (HOSPITAL_BASED_OUTPATIENT_CLINIC_OR_DEPARTMENT_OTHER): Payer: Medicare Other | Admitting: Hematology and Oncology

## 2014-06-14 ENCOUNTER — Other Ambulatory Visit: Payer: Self-pay | Admitting: *Deleted

## 2014-06-14 ENCOUNTER — Telehealth: Payer: Self-pay | Admitting: *Deleted

## 2014-06-14 ENCOUNTER — Other Ambulatory Visit (HOSPITAL_BASED_OUTPATIENT_CLINIC_OR_DEPARTMENT_OTHER): Payer: Medicare Other

## 2014-06-14 VITALS — BP 135/48 | HR 79 | Temp 98.3°F | Resp 18 | Ht 65.0 in | Wt 206.6 lb

## 2014-06-14 DIAGNOSIS — D3A090 Benign carcinoid tumor of the bronchus and lung: Secondary | ICD-10-CM | POA: Diagnosis not present

## 2014-06-14 DIAGNOSIS — Z9484 Stem cells transplant status: Secondary | ICD-10-CM | POA: Diagnosis not present

## 2014-06-14 DIAGNOSIS — D630 Anemia in neoplastic disease: Secondary | ICD-10-CM

## 2014-06-14 DIAGNOSIS — Z5112 Encounter for antineoplastic immunotherapy: Secondary | ICD-10-CM

## 2014-06-14 DIAGNOSIS — C9001 Multiple myeloma in remission: Secondary | ICD-10-CM

## 2014-06-14 DIAGNOSIS — C9000 Multiple myeloma not having achieved remission: Secondary | ICD-10-CM

## 2014-06-14 LAB — COMPREHENSIVE METABOLIC PANEL (CC13)
ALT: 33 U/L (ref 0–55)
AST: 24 U/L (ref 5–34)
Albumin: 3.8 g/dL (ref 3.5–5.0)
Alkaline Phosphatase: 90 U/L (ref 40–150)
Anion Gap: 13 mEq/L — ABNORMAL HIGH (ref 3–11)
BUN: 24.1 mg/dL (ref 7.0–26.0)
CALCIUM: 9.9 mg/dL (ref 8.4–10.4)
CHLORIDE: 107 meq/L (ref 98–109)
CO2: 22 mEq/L (ref 22–29)
CREATININE: 1.2 mg/dL — AB (ref 0.6–1.1)
EGFR: 46 mL/min/{1.73_m2} — ABNORMAL LOW (ref >90)
Glucose: 167 mg/dl — ABNORMAL HIGH (ref 70–140)
Potassium: 4.4 mEq/L (ref 3.5–5.1)
Sodium: 142 mEq/L (ref 136–145)
Total Bilirubin: 0.32 mg/dL (ref 0.20–1.20)
Total Protein: 6.7 g/dL (ref 6.4–8.3)

## 2014-06-14 LAB — CBC WITH DIFFERENTIAL/PLATELET
BASO%: 0.4 % (ref 0.0–2.0)
Basophils Absolute: 0 10*3/uL (ref 0.0–0.1)
EOS%: 2.9 % (ref 0.0–7.0)
Eosinophils Absolute: 0.1 10*3/uL (ref 0.0–0.5)
HEMATOCRIT: 35.4 % (ref 34.8–46.6)
HGB: 11.4 g/dL — ABNORMAL LOW (ref 11.6–15.9)
LYMPH%: 20.7 % (ref 14.0–49.7)
MCH: 32 pg (ref 25.1–34.0)
MCHC: 32.2 g/dL (ref 31.5–36.0)
MCV: 99.4 fL (ref 79.5–101.0)
MONO#: 0.4 10*3/uL (ref 0.1–0.9)
MONO%: 8.8 % (ref 0.0–14.0)
NEUT#: 3.3 10*3/uL (ref 1.5–6.5)
NEUT%: 67.2 % (ref 38.4–76.8)
PLATELETS: 173 10*3/uL (ref 145–400)
RBC: 3.56 10*6/uL — ABNORMAL LOW (ref 3.70–5.45)
RDW: 14.9 % — ABNORMAL HIGH (ref 11.2–14.5)
WBC: 4.9 10*3/uL (ref 3.9–10.3)
lymph#: 1 10*3/uL (ref 0.9–3.3)

## 2014-06-14 MED ORDER — ONDANSETRON HCL 8 MG PO TABS
8.0000 mg | ORAL_TABLET | Freq: Once | ORAL | Status: AC
  Administered 2014-06-14: 8 mg via ORAL

## 2014-06-14 MED ORDER — ONDANSETRON HCL 8 MG PO TABS
ORAL_TABLET | ORAL | Status: AC
  Filled 2014-06-14: qty 1

## 2014-06-14 MED ORDER — SODIUM CHLORIDE 0.9 % IV SOLN: Freq: Once | INTRAVENOUS | Status: DC

## 2014-06-14 MED ORDER — BORTEZOMIB CHEMO SQ INJECTION 3.5 MG (2.5MG/ML)
1.3000 mg/m2 | Freq: Once | INTRAMUSCULAR | Status: AC
  Administered 2014-06-14: 2.75 mg via SUBCUTANEOUS
  Filled 2014-06-14: qty 2.75

## 2014-06-14 MED ORDER — HEPATITIS B VAC RECOMBINANT 20 MCG/ML IJ SUSP: 1.0000 mL | Freq: Once | INTRAMUSCULAR | Status: DC

## 2014-06-14 MED ORDER — ACYCLOVIR 400 MG PO TABS: 400.0000 mg | ORAL_TABLET | Freq: Every day | ORAL | Status: DC

## 2014-06-14 MED ORDER — ZOLEDRONIC ACID 4 MG/100ML IV SOLN
4.0000 mg | Freq: Once | INTRAVENOUS | Status: AC
  Administered 2014-06-14: 4 mg via INTRAVENOUS
  Filled 2014-06-14: qty 100

## 2014-06-14 MED ORDER — HAEMOPHILUS B POLYSAC CONJ VAC IM SOLR: 0.5000 mL | Freq: Once | INTRAMUSCULAR | Status: DC

## 2014-06-14 NOTE — Addendum Note (Signed)
Addended by: Enis Gash on: 06/14/2014 03:47 PM   Modules accepted: Orders

## 2014-06-14 NOTE — Patient Instructions (Signed)
[facility] Discharge Instructions for Patients Receiving Chemotherapy  Today you received the following chemotherapy agents Velcade and Zometa  To help prevent nausea and vomiting after your treatment, we encourage you to take your nausea medication as directed    If you develop nausea and vomiting that is not controlled by your nausea medication, call the clinic.   BELOW ARE SYMPTOMS THAT SHOULD BE REPORTED IMMEDIATELY:  *FEVER GREATER THAN 100.5 F  *CHILLS WITH OR WITHOUT FEVER  NAUSEA AND VOMITING THAT IS NOT CONTROLLED WITH YOUR NAUSEA MEDICATION  *UNUSUAL SHORTNESS OF BREATH  *UNUSUAL BRUISING OR BLEEDING  TENDERNESS IN MOUTH AND THROAT WITH OR WITHOUT PRESENCE OF ULCERS  *URINARY PROBLEMS  *BOWEL PROBLEMS  UNUSUAL RASH Items with * indicate a potential emergency and should be followed up as soon as possible.  Feel free to call the clinic you have any questions or concerns. The clinic phone number is [phone].  Please show the [place] at check-in to the Emergency Department and triage nurse.

## 2014-06-14 NOTE — Telephone Encounter (Signed)
Gave patient avs report and appointments for May thru August.

## 2014-06-14 NOTE — Telephone Encounter (Signed)
Received order from [facility] for HIB and Hep B vaccines.  Notified Dr. Alvy Bimler and orders placed under signed and held for next visit on 06/28/14.

## 2014-06-14 NOTE — Telephone Encounter (Signed)
S/w Triage RN, Maudie Mercury, at [facility].  Reqeusted CT chest from April 2016 on CD mailed to Dr. Alvy Bimler.  Also requested they send [place] Immunization record and schedule for pt s/p Transplant.  We would like to make sure we keep up w/ any vaccines that are due.  Asked her to fax [place] the schedule.  She agreed.

## 2014-06-15 NOTE — Assessment & Plan Note (Signed)
She is due for her post transplant vaccination  For some reason, she has not received them and we will administer in her next visit.

## 2014-06-15 NOTE — Progress Notes (Signed)
[place] OFFICE PROGRESS NOTE  Patient Care Team: Maurice Small, MD as PCP - General (Family Medicine) Laurence Spates, MD as Attending Physician (Gastroenterology) Jerrell Belfast, MD as Attending Physician (Otolaryngology) Collene Gobble, MD as Attending Physician (Pulmonary Disease) Garry Heater, DO as Consulting Physician (Hematology)  SUMMARY OF ONCOLOGIC HISTORY:   Multiple myeloma in remission   05/27/2012 Initial Diagnosis Multiple myeloma, without mention of having achieved remission(203.00)   05/27/2012 Cancer Staging 1.  IgG kappa multiple myeloma. She presented in 05/2012 with anemia, Cr 0.8, Ca 9.6; no bone lytic lesion; LDH 140; beta2- microglobulin 1.86 (ref 1.01-1.73). Positive SPEP for M-spike at 1.69, IgG 2080, kappa 7.24, kappa:lambda ratio 24.97; UPEP positive   06/08/2012 Bone Marrow Biopsy Bone marrow biopsy on 06/08/12 showed 19% plasma cell; normal cytogenetics; myeloma FISH pending.    06/14/2012 Imaging Skeletal survey negative for bone lytic lesions.  Lung nodule apparent.   09/17/2012 Bone Marrow Biopsy Bone marrow biopsy showed 11% plasma cells.   10/18/2012 - 12/31/2012 Chemotherapy Started on VRD (Velcade 1.3 mg/m^2 [place] days 1,8 and 15; Revlimide 25 mg PO daily, on day 1 through 14; Decadron 40 mg PO Days 1, 8, 15).   Started on coumadin given RD.   01/14/2013 Bone Marrow Biopsy The bone marrow clot sections are normocellular toslightly hypercellular for age (approximately 40% overall). 1% plasma cells.   02/08/2013 Bone Marrow Transplant Auto Transplant at [facility] (Dr. Glorianne Manchester McIver).   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 Lamonte Sakai)   07/08/2012 Pathology Results Pathology  consistent with Non-small cell lung cancer of RLL.    07/14/2012 Imaging 1. Right lower lobe nodule is non hypermetabolic which suggests benign etiology considering the slow growing rate in 6 years or non FDG avid disease as seen with carcinoid tumors. Pending biopsy. No PET evidence of mediastinal nodal disease.    08/02/2012 Imaging MRI of brain negative.   08/12/2012 Surgery Robotic Lobectomy of R Lower lobe.    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     Surgical resection margins are negative.     Two benign lymph nodes.     [id number]    INTERVAL HISTORY: Please see below for problem oriented charting. She returns for further follow-up. She has no new concerns except for abnormal CT scan from [facility] in April 2016. Denies peripheral neuropathy. No recent infection. No recent cough or chest pain.  REVIEW OF SYSTEMS:   Constitutional: Denies fevers, chills or abnormal weight loss Eyes: Denies blurriness of vision Ears, nose, mouth, throat, and face: Denies mucositis or sore throat Respiratory: Denies cough, dyspnea or wheezes Cardiovascular: Denies palpitation, chest discomfort or lower extremity swelling Gastrointestinal:  Denies nausea, heartburn or change in bowel habits Skin: Denies abnormal skin rashes Lymphatics: Denies new lymphadenopathy or easy bruising Neurological:Denies numbness, tingling or new weaknesses Behavioral/Psych: Mood is stable, no new changes  All other systems were reviewed with the patient and are negative.  I have reviewed the past medical history, past surgical history, social history and family history with the patient and they are unchanged from previous note.  ALLERGIES:  has No Known Allergies.  MEDICATIONS:  Current Outpatient Prescriptions  Medication Sig Dispense Refill  . buPROPion (WELLBUTRIN XL) 300 MG 24 hr tablet Take 300 mg by mouth daily.    .  cholecalciferol (VITAMIN D) 1000 UNITS tablet Take 2,000 Units by mouth daily.    Marland Kitchen esomeprazole (NEXIUM) 40 MG capsule Take 40 mg by mouth daily.     Marland Kitchen losartan (COZAAR) 50 MG tablet Take 50 mg by mouth daily.    . magnesium chloride (SLOW-MAG) 64 MG TBEC SR tablet Take 1 tablet by mouth 2 (two) times daily.     . metFORMIN (GLUCOPHAGE) 500 MG tablet TAKE 1 TABLET (500 MG TOTAL) BY MOUTH 2 (TWO) TIMES DAILY WITH A MEAL. 60 tablet 1  . metoprolol succinate (TOPROL-XL) 50 MG 24 hr tablet Take 50 mg by mouth daily. Take with or immediately following a meal.    . Multiple Vitamins-Minerals (MULTIVITAMIN WITH MINERALS) tablet Take 1 tablet by mouth at bedtime.     . Multiple Vitamins-Minerals (PRESERVISION AREDS 2) CAPS Take 1 capsule by mouth 2 (two) times daily.    . sertraline (ZOLOFT) 100 MG tablet Take 100 mg by mouth daily.     . simvastatin (ZOCOR) 40 MG tablet Take 40 mg by mouth at bedtime.     Marland Kitchen acyclovir (ZOVIRAX) 400 MG tablet Take 1 tablet (400 mg total) by mouth daily. 90 tablet 3   No current facility-administered medications for this visit.    PHYSICAL EXAMINATION: ECOG PERFORMANCE STATUS: 0 - Asymptomatic  Filed Vitals:   06/14/14 1126  BP: 135/48  Pulse: 79  Temp: 98.3 F (36.8 C)  Resp: 18   Filed Weights   06/14/14 1126  Weight: 206 lb 9.6 oz (93.713 kg)    GENERAL:alert, no distress and comfortable SKIN: skin color, texture, turgor are normal, no rashes or significant lesions EYES: normal, Conjunctiva are pink and non-injected, sclera clear OROPHARYNX:no exudate, no erythema and lips, buccal mucosa, and tongue normal  NECK: supple, thyroid normal size, non-tender, without nodularity LYMPH:  no palpable lymphadenopathy in the cervical, axillary or inguinal LUNGS: clear to auscultation and percussion with normal breathing effort HEART: regular rate & rhythm and no murmurs and no lower extremity edema ABDOMEN:abdomen soft, non-tender and normal bowel  sounds Musculoskeletal:no cyanosis of digits and no clubbing  NEURO: alert & oriented x 3 with fluent speech, no focal motor/sensory deficits  LABORATORY DATA:  I have reviewed the data as listed    Component Value Date/Time   NA 142 06/14/2014 1100   NA 141 03/15/2014 1741   K 4.4 06/14/2014 1100   K 4.5 03/15/2014 1741   CL 107 03/15/2014 1741   CL 105 06/08/2012 0913   CO2 22 06/14/2014 1100   CO2 29 10/10/2013 0726   GLUCOSE 167* 06/14/2014 1100   GLUCOSE 106* 03/15/2014 1741   GLUCOSE 120* 06/08/2012 0913   BUN 24.1 06/14/2014 1100   BUN 24* 03/15/2014 1741   CREATININE 1.2* 06/14/2014 1100   CREATININE 1.00 03/15/2014 1741   CALCIUM 9.9 06/14/2014 1100   CALCIUM 9.1 10/10/2013 0726   PROT 6.7 06/14/2014 1100   PROT 5.9* 10/08/2013 0927   ALBUMIN 3.8 06/14/2014 1100   ALBUMIN 2.2* 10/08/2013 0927   AST 24 06/14/2014 1100   AST 36 10/08/2013 0927   ALT 33 06/14/2014 1100   ALT 46* 10/08/2013 0927   ALKPHOS 90 06/14/2014 1100   ALKPHOS 178* 10/08/2013 0927   BILITOT 0.32 06/14/2014 1100   BILITOT 0.4 10/08/2013 0092  GFRNONAA 87* 10/10/2013 0726   GFRAA >90 10/10/2013 0726    No results found for: SPEP, UPEP  Lab Results  Component Value Date   WBC 4.9 06/14/2014   NEUTROABS 3.3 06/14/2014   HGB 11.4* 06/14/2014   HCT 35.4 06/14/2014   MCV 99.4 06/14/2014   PLT 173 06/14/2014      Chemistry      Component Value Date/Time   NA 142 06/14/2014 1100   NA 141 03/15/2014 1741   K 4.4 06/14/2014 1100   K 4.5 03/15/2014 1741   CL 107 03/15/2014 1741   CL 105 06/08/2012 0913   CO2 22 06/14/2014 1100   CO2 29 10/10/2013 0726   BUN 24.1 06/14/2014 1100   BUN 24* 03/15/2014 1741   CREATININE 1.2* 06/14/2014 1100   CREATININE 1.00 03/15/2014 1741      Component Value Date/Time   CALCIUM 9.9 06/14/2014 1100   CALCIUM 9.1 10/10/2013 0726   ALKPHOS 90 06/14/2014 1100   ALKPHOS 178* 10/08/2013 0927   AST 24 06/14/2014 1100   AST 36 10/08/2013 0927   ALT  33 06/14/2014 1100   ALT 46* 10/08/2013 0927   BILITOT 0.32 06/14/2014 1100   BILITOT 0.4 10/08/2013 0927       RADIOGRAPHIC STUDIES: I reviewed multiple CT scan with the patient I have personally reviewed the radiological images as listed and agreed with the findings in the report.   ASSESSMENT & PLAN:  Multiple myeloma in remission  I am 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 This is only mild, grade 1. Recommend close observation. She will continue Velcade every other week for 2 years per recommendation from the transplant physician. Her recent blood work showed small detectable M spike which is not a surprise. I recommend treatment unless her blood work continued to get worse.  She will continue treatment for now along with Zometa monthly.    Carcinoid tumor of left lung According to the patient's record, she had CT scan of the chest done at [facility] and she is concerned about mild anomalies described. I reviewed the PET CT scan from February with the patient and showed she have persistent mild abnormality which I suspect could be related to prior carcinoid tumor/infection. I have requested the patient to sign consent for authorization so that we can obtain a copy of the CT scan from [facility] for comparison. I'm comfortable to wait 6 months until October for another repeat CT scan as she is not symptomatic.     Anemia in neoplastic disease This is likely anemia of chronic disease. The patient denies recent history of bleeding such as epistaxis, hematuria or hematochezia. She is asymptomatic from the anemia. We will observe for now.  She does not require transfusion now.       H/O autologous stem cell transplant She is due for her post transplant vaccination  For some reason, she has not received them and we will administer in her next visit.      No orders of the defined types were placed in this encounter.   All questions were answered. The patient  knows to call the clinic with any problems, questions or concerns. No barriers to learning was detected. I spent 25 minutes counseling the patient face to face. The total time spent in the appointment was 30 minutes and more than 50% was on counseling and review of test results     [facility], Mairim Bade, MD 06/15/2014 11:13 AM

## 2014-06-15 NOTE — Assessment & Plan Note (Signed)
This is likely anemia of chronic disease. The patient denies recent history of bleeding such as epistaxis, hematuria or hematochezia. She is asymptomatic from the anemia. We will observe for now.  She does not require transfusion now.   

## 2014-06-15 NOTE — Assessment & Plan Note (Signed)
I am 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 This is only mild, grade 1. Recommend close observation. She will continue Velcade every other week for 2 years per recommendation from the transplant physician. Her recent blood work showed small detectable M spike which is not a surprise. I recommend treatment unless her blood work continued to get worse.  She will continue treatment for now along with Zometa monthly.

## 2014-06-15 NOTE — Assessment & Plan Note (Signed)
According to the patient's record, she had CT scan of the chest done at [facility] and she is concerned about mild anomalies described. I reviewed the PET CT scan from February with the patient and showed she have persistent mild abnormality which I suspect could be related to prior carcinoid tumor/infection. I have requested the patient to sign consent for authorization so that we can obtain a copy of the CT scan from [facility] for comparison. I'm comfortable to wait 6 months until October for another repeat CT scan as she is not symptomatic.

## 2014-06-21 ENCOUNTER — Other Ambulatory Visit: Payer: Self-pay | Admitting: Hematology and Oncology

## 2014-06-24 ENCOUNTER — Emergency Department (HOSPITAL_COMMUNITY): Payer: Medicare Other

## 2014-06-24 ENCOUNTER — Encounter (HOSPITAL_COMMUNITY): Payer: Self-pay | Admitting: Emergency Medicine

## 2014-06-24 ENCOUNTER — Emergency Department (HOSPITAL_COMMUNITY)
Admission: EM | Admit: 2014-06-24 | Discharge: 2014-06-24 | Disposition: A | Discharge status: Home / Self Care | Payer: Medicare Other | Attending: Emergency Medicine | Admitting: Emergency Medicine

## 2014-06-24 DIAGNOSIS — E780 Pure hypercholesterolemia: Secondary | ICD-10-CM | POA: Diagnosis not present

## 2014-06-24 DIAGNOSIS — Z862 Personal history of diseases of the blood and blood-forming organs and certain disorders involving the immune mechanism: Secondary | ICD-10-CM | POA: Diagnosis not present

## 2014-06-24 DIAGNOSIS — I10 Essential (primary) hypertension: Secondary | ICD-10-CM | POA: Insufficient documentation

## 2014-06-24 DIAGNOSIS — M810 Age-related osteoporosis without current pathological fracture: Secondary | ICD-10-CM | POA: Diagnosis not present

## 2014-06-24 DIAGNOSIS — S8992XA Unspecified injury of left lower leg, initial encounter: Secondary | ICD-10-CM | POA: Diagnosis not present

## 2014-06-24 DIAGNOSIS — Z8619 Personal history of other infectious and parasitic diseases: Secondary | ICD-10-CM | POA: Insufficient documentation

## 2014-06-24 DIAGNOSIS — F329 Major depressive disorder, single episode, unspecified: Secondary | ICD-10-CM | POA: Diagnosis not present

## 2014-06-24 DIAGNOSIS — Z8611 Personal history of tuberculosis: Secondary | ICD-10-CM | POA: Diagnosis not present

## 2014-06-24 DIAGNOSIS — Z8701 Personal history of pneumonia (recurrent): Secondary | ICD-10-CM | POA: Diagnosis not present

## 2014-06-24 DIAGNOSIS — Y9389 Activity, other specified: Secondary | ICD-10-CM | POA: Insufficient documentation

## 2014-06-24 DIAGNOSIS — Y998 Other external cause status: Secondary | ICD-10-CM | POA: Diagnosis not present

## 2014-06-24 DIAGNOSIS — E119 Type 2 diabetes mellitus without complications: Secondary | ICD-10-CM | POA: Insufficient documentation

## 2014-06-24 DIAGNOSIS — Y9289 Other specified places as the place of occurrence of the external cause: Secondary | ICD-10-CM | POA: Diagnosis not present

## 2014-06-24 DIAGNOSIS — Z8579 Personal history of other malignant neoplasms of lymphoid, hematopoietic and related tissues: Secondary | ICD-10-CM | POA: Diagnosis not present

## 2014-06-24 DIAGNOSIS — M25562 Pain in left knee: Secondary | ICD-10-CM

## 2014-06-24 DIAGNOSIS — Z87891 Personal history of nicotine dependence: Secondary | ICD-10-CM | POA: Insufficient documentation

## 2014-06-24 DIAGNOSIS — Z79899 Other long term (current) drug therapy: Secondary | ICD-10-CM | POA: Diagnosis not present

## 2014-06-24 DIAGNOSIS — K219 Gastro-esophageal reflux disease without esophagitis: Secondary | ICD-10-CM | POA: Diagnosis not present

## 2014-06-24 DIAGNOSIS — Z9889 Other specified postprocedural states: Secondary | ICD-10-CM | POA: Diagnosis not present

## 2014-06-24 DIAGNOSIS — W010XXA Fall on same level from slipping, tripping and stumbling without subsequent striking against object, initial encounter: Secondary | ICD-10-CM | POA: Diagnosis not present

## 2014-06-24 MED ORDER — HYDROCODONE-ACETAMINOPHEN 5-325 MG PO TABS
1.0000 | ORAL_TABLET | Freq: Once | ORAL | Status: AC
  Administered 2014-06-24: 1 via ORAL
  Filled 2014-06-24: qty 1

## 2014-06-24 MED ORDER — HYDROCODONE-ACETAMINOPHEN 5-325 MG PO TABS: 1.0000 | ORAL_TABLET | Freq: Four times a day (QID) | ORAL | Status: DC | PRN

## 2014-06-24 NOTE — ED Notes (Signed)
Pt reports that she tripped on her rug last night and fell, hurting her L knee. Pt has had previous surgeries to L leg.

## 2014-06-24 NOTE — Discharge Instructions (Signed)
Please follow up with your primary care physician in 1-2 days. If you do not have one please call the [facility] number listed above. Please follow up with Dr. Erlinda Hong to schedule a follow up appointment.  Please take pain medication and/or muscle relaxants as prescribed and as needed for pain. Please do not drive on narcotic pain medication or on muscle relaxants. Please read all discharge instructions and return precautions.   Knee Pain The knee is the complex joint between your thigh and your lower leg. It is made up of bones, tendons, ligaments, and cartilage. The bones that make up the knee are:  The femur in the thigh.  The tibia and fibula in the lower leg.  The patella or kneecap riding in the groove on the lower femur. CAUSES  Knee pain is a common complaint with many causes. A few of these causes are:  Injury, such as:  A ruptured ligament or tendon injury.  Torn cartilage.  Medical conditions, such as:  Gout  Arthritis  Infections  Overuse, over training, or overdoing a physical activity. Knee pain can be minor or severe. Knee pain can accompany debilitating injury. Minor knee problems often respond well to self-care measures or get well on their own. More serious injuries may need medical intervention or even surgery. SYMPTOMS The knee is complex. Symptoms of knee problems can vary widely. Some of the problems are:  Pain with movement and weight bearing.  Swelling and tenderness.  Buckling of the knee.  Inability to straighten or extend your knee.  Your knee locks and you cannot straighten it.  Warmth and redness with pain and fever.  Deformity or dislocation of the kneecap. DIAGNOSIS  Determining what is wrong may be very straight forward such as when there is an injury. It can also be challenging because of the complexity of the knee. Tests to make a diagnosis may include:  Your caregiver taking a history and doing a physical  exam.  Routine X-rays can be used to rule out other problems. X-rays will not reveal a cartilage tear. Some injuries of the knee can be diagnosed by:  Arthroscopy a surgical technique by which a small video camera is inserted through tiny incisions on the sides of the knee. This procedure is used to examine and repair internal knee joint problems. Tiny instruments can be used during arthroscopy to repair the torn knee cartilage (meniscus).  Arthrography is a radiology technique. A contrast liquid is directly injected into the knee joint. Internal structures of the knee joint then become visible on X-ray film.  An MRI scan is a non X-ray radiology procedure in which magnetic fields and a computer produce two- or three-dimensional images of the inside of the knee. Cartilage tears are often visible using an MRI scanner. MRI scans have largely replaced arthrography in diagnosing cartilage tears of the knee.  Blood work.  Examination of the fluid that helps to lubricate the knee joint (synovial fluid). This is done by taking a sample out using a needle and a syringe. TREATMENT The treatment of knee problems depends on the cause. Some of these treatments are:  Depending on the injury, proper casting, splinting, surgery, or physical therapy care will be needed.  Give yourself adequate recovery time. Do not overuse your joints. If you begin to get sore during workout routines, back off. Slow down or do fewer repetitions.  For repetitive activities such as cycling or running, maintain your strength and nutrition.  Alternate  muscle groups. For example, if you are a weight lifter, work the upper body on one day and the lower body the next.  Either tight or weak muscles do not give the proper support for your knee. Tight or weak muscles do not absorb the stress placed on the knee joint. Keep the muscles surrounding the knee strong.  Take care of mechanical problems.  If you have flat feet, orthotics  or special shoes may help. See your caregiver if you need help.  Arch supports, sometimes with wedges on the inner or outer aspect of the heel, can help. These can shift pressure away from the side of the knee most bothered by osteoarthritis.  A brace called an "unloader" brace also may be used to help ease the pressure on the most arthritic side of the knee.  If your caregiver has prescribed crutches, braces, wraps or ice, use as directed. The acronym for this is PRICE. This means protection, rest, ice, compression, and elevation.  Nonsteroidal anti-inflammatory drugs (NSAIDs), can help relieve pain. But if taken immediately after an injury, they may actually increase swelling. Take NSAIDs with food in your stomach. Stop them if you develop stomach problems. Do not take these if you have a history of ulcers, stomach pain, or bleeding from the bowel. Do not take without your caregiver's approval if you have problems with fluid retention, heart failure, or kidney problems.  For ongoing knee problems, physical therapy may be helpful.  Glucosamine and chondroitin are over-the-counter dietary supplements. Both may help relieve the pain of osteoarthritis in the knee. These medicines are different from the usual anti-inflammatory drugs. Glucosamine may decrease the rate of cartilage destruction.  Injections of a corticosteroid drug into your knee joint may help reduce the symptoms of an arthritis flare-up. They may provide pain relief that lasts a few months. You may have to wait a few months between injections. The injections do have a small increased risk of infection, water retention, and elevated blood sugar levels.  Hyaluronic acid injected into damaged joints may ease pain and provide lubrication. These injections may work by reducing inflammation. A series of shots may give relief for as long as 6 months.  Topical painkillers. Applying certain ointments to your skin may help relieve the pain and  stiffness of osteoarthritis. Ask your pharmacist for suggestions. Many over the-counter products are approved for temporary relief of arthritis pain.  In some countries, doctors often prescribe topical NSAIDs for relief of chronic conditions such as arthritis and tendinitis. A review of treatment with NSAID creams found that they worked as well as oral medications but without the serious side effects. PREVENTION  Maintain a healthy weight. Extra pounds put more strain on your joints.  Get strong, stay limber. Weak muscles are a common cause of knee injuries. Stretching is important. Include flexibility exercises in your workouts.  Be smart about exercise. If you have osteoarthritis, chronic knee pain or recurring injuries, you may need to change the way you exercise. This does not mean you have to stop being active. If your knees ache after jogging or playing basketball, consider switching to swimming, water aerobics, or other low-impact activities, at least for a few days a week. Sometimes limiting high-impact activities will provide relief.  Make sure your shoes fit well. Choose footwear that is right for your sport.  Protect your knees. Use the proper gear for knee-sensitive activities. Use kneepads when playing volleyball or laying carpet. Buckle your seat belt every time you drive.  Most shattered kneecaps occur in car accidents.  Rest when you are tired. SEEK MEDICAL CARE IF:  You have knee pain that is continual and does not seem to be getting better.  SEEK IMMEDIATE MEDICAL CARE IF:  Your knee joint feels hot to the touch and you have a high fever. MAKE SURE YOU:   Understand these instructions.  Will watch your condition.  Will get help right away if you are not doing well or get worse. Document Released: 11/10/2006 Document Revised: 04/07/2011 Document Reviewed: 11/10/2006 [facility] Patient Information 2015 [place], [place]. This information is not intended to replace advice given  to you by your health care provider. Make sure you discuss any questions you have with your health care provider. RICE: Routine Care for Injuries The routine care of many injuries includes Rest, Ice, Compression, and Elevation (RICE). HOME CARE INSTRUCTIONS  Rest is needed to allow your body to heal. Routine activities can usually be resumed when comfortable. Injured tendons and bones can take up to 6 weeks to heal. Tendons are the cord-like structures that attach muscle to bone.  Ice following an injury helps keep the swelling down and reduces pain.  Put ice in a plastic bag.  Place a towel between your skin and the bag.  Leave the ice on for 15-20 minutes, 3-4 times a day, or as directed by your health care provider. Do this while awake, for the first 24 to 48 hours. After that, continue as directed by your caregiver.  Compression helps keep swelling down. It also gives support and helps with discomfort. If an elastic bandage has been applied, it should be removed and reapplied every 3 to 4 hours. It should not be applied tightly, but firmly enough to keep swelling down. Watch fingers or toes for swelling, bluish discoloration, coldness, numbness, or excessive pain. If any of these problems occur, remove the bandage and reapply loosely. Contact your caregiver if these problems continue.  Elevation helps reduce swelling and decreases pain. With extremities, such as the arms, hands, legs, and feet, the injured area should be placed near or above the level of the heart, if possible. SEEK IMMEDIATE MEDICAL CARE IF:  You have persistent pain and swelling.  You develop redness, numbness, or unexpected weakness.  Your symptoms are getting worse rather than improving after several days. These symptoms may indicate that further evaluation or further X-rays are needed. Sometimes, X-rays may not show a small broken bone (fracture) until 1 week or 10 days later. Make a follow-up appointment with your  caregiver. Ask when your X-ray results will be ready. Make sure you get your X-ray results. Document Released: 04/27/2000 Document Revised: 01/18/2013 Document Reviewed: 06/14/2010 [facility] Patient Information 2015 [place], [place]. This information is not intended to replace advice given to you by your health care provider. Make sure you discuss any questions you have with your health care provider.

## 2014-06-24 NOTE — ED Provider Notes (Signed)
CSN: [id number]     Arrival date & time 06/24/14  1104 History   First MD Initiated Contact with Patient 06/24/14 1111     Chief Complaint  Patient presents with  . Knee Pain     (Consider location/radiation/quality/duration/timing/severity/associated sxs/prior Treatment) HPI Comments: Pt reports that she tripped on her rug last night and fell, hurting her L knee. Pt has had previous surgeries to L leg.  Patient is a 69 y.o. female presenting with knee pain. The history is provided by the patient.  Knee Pain Location:  Knee Time since incident:  1 day Injury: yes   Mechanism of injury: fall   Fall:    Fall occurred:  Tripped   Point of impact:  Knees   Entrapped after fall: no   Knee location:  L knee Pain details:    Quality:  Throbbing, pressure and aching   Radiates to:  Does not radiate   Severity:  Severe   Onset quality:  Sudden   Duration:  1 day   Timing:  Constant   Progression:  Unchanged Chronicity:  New Foreign body present:  No foreign bodies Relieved by:  None tried Worsened by:  Bearing weight and extension Ineffective treatments:  None tried Associated symptoms: stiffness and swelling   Associated symptoms: no back pain, no fever and no numbness     Past Medical History  Diagnosis Date  . Osteoporosis   . Hypercholesterolemia   . Esophageal reflux disease   . Hypertension   . Diabetes type 2, controlled   . Pancytopenia   . Ischemic colitis 2009  . Lung nodule 06/11/2012  . Depression   . Sleep apnea     UPPER AIRWAY RESISTANCE  DOES NOT HAVE CPAP , MAY NEED   . Tuberculosis      (+ TB TEST)YEARS AGO POSSIBLE EXPOSURE WAS MED TX   . Multiple myeloma 05/2012    Cytogenetics on 06/08/2012 was normal 60, XX [20]  . Liver enzyme elevation   . Sepsis   . Pneumonia   . Post-nasal drip 01/11/2014  . Cough 01/11/2014  . Type 2 diabetes mellitus without complications 08/30/3823  . Other fatigue 02/08/2014   Past Surgical History  Procedure Laterality  Date  . Colonoscopy  2013    poly; Dr. Oletta Lamas, next one due 2018.l  . Appendectomy    . Tonsillectomy    . Leg surgery    . Video bronchoscopy with endobronchial navigation Right 07/08/2012    Procedure: VIDEO BRONCHOSCOPY WITH ENDOBRONCHIAL NAVIGATION;  Surgeon: Collene Gobble, MD;  Location: [facility];  Service: Thoracic;  Laterality: Right;   Family History  Problem Relation Age of Onset  . Heart attack Father   . Cancer Sister     synovial sarcoma  . Cancer Maternal Grandfather     ? cancer   History  Substance Use Topics  . Smoking status: Former Smoker -- 1.00 packs/day for 30 years    Types: Cigarettes    Quit date: 01/27/2009  . Smokeless tobacco: Never Used  . Alcohol Use: 0.0 oz/week     Comment: occasionally   OB History    No data available     Review of Systems  Constitutional: Negative for fever.  Musculoskeletal: Positive for stiffness. Negative for back pain.  All other systems reviewed and are negative.     Allergies  Review of patient's allergies indicates no known allergies.  Home Medications   Prior to Admission medications   Medication Sig  Start Date End Date Taking? Authorizing Provider  acyclovir (ZOVIRAX) 400 MG tablet Take 1 tablet (400 mg total) by mouth daily. 06/14/14   Heath Lark, MD  buPROPion (WELLBUTRIN XL) 300 MG 24 hr tablet Take 300 mg by mouth daily.    Historical Provider, MD  cholecalciferol (VITAMIN D) 1000 UNITS tablet Take 2,000 Units by mouth daily.    Historical Provider, MD  esomeprazole (NEXIUM) 40 MG capsule Take 40 mg by mouth daily.     Historical Provider, MD  HYDROcodone-acetaminophen (NORCO/VICODIN) 5-325 MG per tablet Take 1-2 tablets by mouth every 6 (six) hours as needed. 06/24/14   Kacper Cartlidge, PA-C  losartan (COZAAR) 50 MG tablet Take 50 mg by mouth daily.    Historical Provider, MD  magnesium chloride (SLOW-MAG) 64 MG TBEC SR tablet Take 1 tablet by mouth 2 (two) times daily.     Historical Provider, MD   metFORMIN (GLUCOPHAGE) 500 MG tablet TAKE 1 TABLET (500 MG TOTAL) BY MOUTH 2 (TWO) TIMES DAILY WITH A MEAL. 06/22/14   Heath Lark, MD  metoprolol succinate (TOPROL-XL) 50 MG 24 hr tablet Take 50 mg by mouth daily. Take with or immediately following a meal.    Historical Provider, MD  Multiple Vitamins-Minerals (MULTIVITAMIN WITH MINERALS) tablet Take 1 tablet by mouth at bedtime.     Historical Provider, MD  Multiple Vitamins-Minerals (PRESERVISION AREDS 2) CAPS Take 1 capsule by mouth 2 (two) times daily.    Historical Provider, MD  sertraline (ZOLOFT) 100 MG tablet Take 100 mg by mouth daily.     Historical Provider, MD  simvastatin (ZOCOR) 40 MG tablet Take 40 mg by mouth at bedtime.     Historical Provider, MD   BP 132/83 mmHg  Pulse 75  Temp(Src) 98.3 F (36.8 C) (Oral)  Resp 16  Ht 5' 5" (1.651 m)  Wt 200 lb (90.719 kg)  BMI 33.28 kg/m2  SpO2 100% Physical Exam  Constitutional: She is oriented to person, place, and time. She appears well-developed and well-nourished. No distress.  HENT:  Head: Normocephalic and atraumatic.  Right Ear: External ear normal.  Left Ear: External ear normal.  Nose: Nose normal.  Mouth/Throat: Oropharynx is clear and moist.  Eyes: Conjunctivae are normal.  Neck: Normal range of motion. Neck supple.  No nuchal rigidity.   Cardiovascular: Normal rate, regular rhythm, normal heart sounds and intact distal pulses.   Pulmonary/Chest: Effort normal and breath sounds normal.  Abdominal: Soft.  Musculoskeletal: Normal range of motion.       Right knee: Normal.       Left knee: She exhibits swelling. She exhibits normal range of motion, no deformity and no erythema. Tenderness found.  Sensation intact.   Neurological: She is alert and oriented to person, place, and time.  Skin: Skin is warm and dry. She is not diaphoretic.  Psychiatric: She has a normal mood and affect.  Nursing note and vitals reviewed.   ED Course  Procedures (including critical  care time) Medications  HYDROcodone-acetaminophen (NORCO/VICODIN) 5-325 MG per tablet 1 tablet (1 tablet Oral Given 06/24/14 1148)   Labs Review Labs Reviewed - No data to display  Imaging Review Dg Knee Complete 4 Views Left  06/24/2014   CLINICAL DATA:  Fall last night falling onto left knee. Pain in anterior left knee. Initial encounter.  EXAM: LEFT KNEE - COMPLETE 4+ VIEW  COMPARISON:  None.  FINDINGS: No acute fracture or dislocation is seen. Moderate patellofemoral proliferative changes present. There may be a  small suprapatellar joint effusion. Mild medial and lateral joint space narrowing present with associated chondrocalcinosis. The visualized proximal tibia shows previous placement of an intramedullary rod. No abnormal lucency or evidence of fracture surrounding the visualized hardware. Soft tissues are unremarkable.  IMPRESSION: No acute fracture. Tricompartmental degenerative changes, especially involving the patellofemoral joint. Associated probable small suprapatellar joint effusion.   Electronically Signed   By: Aletta Edouard M.D.   On: 06/24/2014 11:43     EKG Interpretation None      MDM   Final diagnoses:  Left knee pain    Filed Vitals:   06/24/14 1112  BP: 132/83  Pulse: 75  Temp: 98.3 F (36.8 C)  Resp: 16   Afebrile, NAD, non-toxic appearing, AAOx4.  Patient X-Ray negative for obvious fracture or dislocation. Neurovascularly intact. Normal sensation. No evidence of compartment syndrome. Pain managed in ED. Pt advised to follow up with PCP if symptoms persist for possibility of missed fracture diagnosis. Patient given knee sleeve while in ED, conservative therapy recommended and discussed. Patient will be dc home & is agreeable with above plan.      Baron Sane, PA-C 06/24/14 1150  Charlesetta Shanks, MD 06/25/14 [phone]

## 2014-06-28 ENCOUNTER — Ambulatory Visit (HOSPITAL_BASED_OUTPATIENT_CLINIC_OR_DEPARTMENT_OTHER): Payer: Medicare Other

## 2014-06-28 ENCOUNTER — Other Ambulatory Visit (HOSPITAL_BASED_OUTPATIENT_CLINIC_OR_DEPARTMENT_OTHER): Payer: Medicare Other

## 2014-06-28 VITALS — BP 128/54 | HR 73 | Temp 98.4°F | Resp 18

## 2014-06-28 DIAGNOSIS — Z23 Encounter for immunization: Secondary | ICD-10-CM

## 2014-06-28 DIAGNOSIS — C9000 Multiple myeloma not having achieved remission: Secondary | ICD-10-CM

## 2014-06-28 DIAGNOSIS — Z9484 Stem cells transplant status: Secondary | ICD-10-CM

## 2014-06-28 DIAGNOSIS — Z5112 Encounter for antineoplastic immunotherapy: Secondary | ICD-10-CM

## 2014-06-28 DIAGNOSIS — C9001 Multiple myeloma in remission: Secondary | ICD-10-CM

## 2014-06-28 LAB — COMPREHENSIVE METABOLIC PANEL (CC13)
ALT: 30 U/L (ref 0–55)
AST: 23 U/L (ref 5–34)
Albumin: 3.6 g/dL (ref 3.5–5.0)
Alkaline Phosphatase: 91 U/L (ref 40–150)
Anion Gap: 9 mEq/L (ref 3–11)
BUN: 20.9 mg/dL (ref 7.0–26.0)
CALCIUM: 9.3 mg/dL (ref 8.4–10.4)
CHLORIDE: 108 meq/L (ref 98–109)
CO2: 23 meq/L (ref 22–29)
CREATININE: 1.1 mg/dL (ref 0.6–1.1)
EGFR: 52 mL/min/{1.73_m2} — ABNORMAL LOW (ref >90)
GLUCOSE: 126 mg/dL (ref 70–140)
Potassium: 4.1 mEq/L (ref 3.5–5.1)
SODIUM: 140 meq/L (ref 136–145)
Total Bilirubin: 0.52 mg/dL (ref 0.20–1.20)
Total Protein: 6.5 g/dL (ref 6.4–8.3)

## 2014-06-28 LAB — CBC WITH DIFFERENTIAL/PLATELET
BASO%: 0.3 % (ref 0.0–2.0)
Basophils Absolute: 0 10*3/uL (ref 0.0–0.1)
EOS%: 2.8 % (ref 0.0–7.0)
Eosinophils Absolute: 0.1 10*3/uL (ref 0.0–0.5)
HCT: 30.8 % — ABNORMAL LOW (ref 34.8–46.6)
HGB: 10 g/dL — ABNORMAL LOW (ref 11.6–15.9)
LYMPH%: 19.8 % (ref 14.0–49.7)
MCH: 32.4 pg (ref 25.1–34.0)
MCHC: 32.5 g/dL (ref 31.5–36.0)
MCV: 99.7 fL (ref 79.5–101.0)
MONO#: 0.3 10*3/uL (ref 0.1–0.9)
MONO%: 7.6 % (ref 0.0–14.0)
NEUT#: 2.5 10*3/uL (ref 1.5–6.5)
NEUT%: 69.5 % (ref 38.4–76.8)
Platelets: 151 10*3/uL (ref 145–400)
RBC: 3.09 10*6/uL — ABNORMAL LOW (ref 3.70–5.45)
RDW: 14.8 % — ABNORMAL HIGH (ref 11.2–14.5)
WBC: 3.5 10*3/uL — ABNORMAL LOW (ref 3.9–10.3)
lymph#: 0.7 10*3/uL — ABNORMAL LOW (ref 0.9–3.3)

## 2014-06-28 MED ORDER — HEPATITIS B VAC RECOMBINANT 20 MCG/ML IJ SUSP
1.0000 mL | Freq: Once | INTRAMUSCULAR | Status: AC
  Administered 2014-06-28: 20 ug via INTRAMUSCULAR
  Filled 2014-06-28: qty 1

## 2014-06-28 MED ORDER — HAEMOPHILUS B POLYSAC CONJ VAC IM SOLR
0.5000 mL | Freq: Once | INTRAMUSCULAR | Status: AC
  Administered 2014-06-28: 0.5 mL via INTRAMUSCULAR
  Filled 2014-06-28: qty 0.5

## 2014-06-28 MED ORDER — ONDANSETRON HCL 8 MG PO TABS
ORAL_TABLET | ORAL | Status: AC
  Filled 2014-06-28: qty 1

## 2014-06-28 MED ORDER — ONDANSETRON HCL 8 MG PO TABS
8.0000 mg | ORAL_TABLET | Freq: Once | ORAL | Status: AC
  Administered 2014-06-28: 8 mg via ORAL

## 2014-06-28 MED ORDER — BORTEZOMIB CHEMO SQ INJECTION 3.5 MG (2.5MG/ML)
1.3000 mg/m2 | Freq: Once | INTRAMUSCULAR | Status: AC
  Administered 2014-06-28: 2.75 mg via SUBCUTANEOUS
  Filled 2014-06-28: qty 2.75

## 2014-06-28 NOTE — Patient Instructions (Signed)
[facility] Discharge Instructions for Patients Receiving Chemotherapy  Today you received the following chemotherapy agents:  Velcade  To help prevent nausea and vomiting after your treatment, we encourage you to take your nausea medication as prescribed.   If you develop nausea and vomiting that is not controlled by your nausea medication, call the clinic.   BELOW ARE SYMPTOMS THAT SHOULD BE REPORTED IMMEDIATELY:  *FEVER GREATER THAN 100.5 F  *CHILLS WITH OR WITHOUT FEVER  NAUSEA AND VOMITING THAT IS NOT CONTROLLED WITH YOUR NAUSEA MEDICATION  *UNUSUAL SHORTNESS OF BREATH  *UNUSUAL BRUISING OR BLEEDING  TENDERNESS IN MOUTH AND THROAT WITH OR WITHOUT PRESENCE OF ULCERS  *URINARY PROBLEMS  *BOWEL PROBLEMS  UNUSUAL RASH Items with * indicate a potential emergency and should be followed up as soon as possible.  Feel free to call the clinic you have any questions or concerns. The clinic phone number is [phone].  Please show the CHEMO ALERT CARD at check-in to the Emergency Department and triage nurse.   

## 2014-07-06 DIAGNOSIS — M62838 Other muscle spasm: Secondary | ICD-10-CM | POA: Diagnosis not present

## 2014-07-07 ENCOUNTER — Telehealth: Payer: Self-pay | Admitting: Hematology and Oncology

## 2014-07-07 ENCOUNTER — Telehealth: Payer: Self-pay | Admitting: *Deleted

## 2014-07-07 ENCOUNTER — Other Ambulatory Visit: Payer: Self-pay | Admitting: Hematology and Oncology

## 2014-07-07 NOTE — Telephone Encounter (Signed)
I reviewed her outside CT scan dated April and compared with the CT scan from February. The groundglass appearance noted on outside CT scan is actually much improved compared to February. Continue observation only

## 2014-07-07 NOTE — Telephone Encounter (Signed)
-----   Message from Heath Lark, MD sent at 07/07/2014  9:21 AM EDT ----- Regarding: outside ct Pls let her know I looked at the scan compared with CT from Feb Her scan at [facility] in April looks better. No need to worry

## 2014-07-07 NOTE — Telephone Encounter (Signed)
Pt notified of message below.

## 2014-07-12 ENCOUNTER — Ambulatory Visit (HOSPITAL_BASED_OUTPATIENT_CLINIC_OR_DEPARTMENT_OTHER): Payer: Medicare Other

## 2014-07-12 ENCOUNTER — Other Ambulatory Visit (HOSPITAL_BASED_OUTPATIENT_CLINIC_OR_DEPARTMENT_OTHER): Payer: Medicare Other

## 2014-07-12 ENCOUNTER — Ambulatory Visit: Payer: Medicare Other

## 2014-07-12 ENCOUNTER — Other Ambulatory Visit: Payer: Self-pay | Admitting: Hematology and Oncology

## 2014-07-12 VITALS — BP 149/64 | HR 78 | Temp 97.0°F

## 2014-07-12 DIAGNOSIS — Z5112 Encounter for antineoplastic immunotherapy: Secondary | ICD-10-CM | POA: Diagnosis present

## 2014-07-12 DIAGNOSIS — C9001 Multiple myeloma in remission: Secondary | ICD-10-CM

## 2014-07-12 DIAGNOSIS — C9000 Multiple myeloma not having achieved remission: Secondary | ICD-10-CM

## 2014-07-12 LAB — COMPREHENSIVE METABOLIC PANEL (CC13)
ALBUMIN: 3.5 g/dL (ref 3.5–5.0)
ALT: 26 U/L (ref 0–55)
ANION GAP: 13 meq/L — AB (ref 3–11)
AST: 20 U/L (ref 5–34)
Alkaline Phosphatase: 95 U/L (ref 40–150)
BUN: 22.2 mg/dL (ref 7.0–26.0)
CHLORIDE: 105 meq/L (ref 98–109)
CO2: 23 mEq/L (ref 22–29)
CREATININE: 1 mg/dL (ref 0.6–1.1)
Calcium: 9.4 mg/dL (ref 8.4–10.4)
EGFR: 56 mL/min/{1.73_m2} — ABNORMAL LOW (ref >90)
GLUCOSE: 139 mg/dL (ref 70–140)
Potassium: 4 mEq/L (ref 3.5–5.1)
Sodium: 141 mEq/L (ref 136–145)
TOTAL PROTEIN: 6.4 g/dL (ref 6.4–8.3)
Total Bilirubin: 0.4 mg/dL (ref 0.20–1.20)

## 2014-07-12 LAB — CBC WITH DIFFERENTIAL/PLATELET
BASO%: 0.6 % (ref 0.0–2.0)
BASOS ABS: 0 10*3/uL (ref 0.0–0.1)
EOS%: 6.2 % (ref 0.0–7.0)
Eosinophils Absolute: 0.2 10*3/uL (ref 0.0–0.5)
HCT: 32 % — ABNORMAL LOW (ref 34.8–46.6)
HEMOGLOBIN: 10.3 g/dL — AB (ref 11.6–15.9)
LYMPH%: 27.6 % (ref 14.0–49.7)
MCH: 32.2 pg (ref 25.1–34.0)
MCHC: 32.2 g/dL (ref 31.5–36.0)
MCV: 100 fL (ref 79.5–101.0)
MONO#: 0.2 10*3/uL (ref 0.1–0.9)
MONO%: 6.2 % (ref 0.0–14.0)
NEUT#: 2 10*3/uL (ref 1.5–6.5)
NEUT%: 59.4 % (ref 38.4–76.8)
PLATELETS: 166 10*3/uL (ref 145–400)
RBC: 3.2 10*6/uL — ABNORMAL LOW (ref 3.70–5.45)
RDW: 14.6 % — ABNORMAL HIGH (ref 11.2–14.5)
WBC: 3.4 10*3/uL — ABNORMAL LOW (ref 3.9–10.3)
lymph#: 0.9 10*3/uL (ref 0.9–3.3)

## 2014-07-12 MED ORDER — ZOLEDRONIC ACID 4 MG/100ML IV SOLN
4.0000 mg | Freq: Once | INTRAVENOUS | Status: AC
  Administered 2014-07-12: 4 mg via INTRAVENOUS
  Filled 2014-07-12: qty 100

## 2014-07-12 MED ORDER — BORTEZOMIB CHEMO SQ INJECTION 3.5 MG (2.5MG/ML)
1.3000 mg/m2 | Freq: Once | INTRAMUSCULAR | Status: AC
  Administered 2014-07-12: 2.75 mg via SUBCUTANEOUS
  Filled 2014-07-12: qty 2.75

## 2014-07-12 MED ORDER — ONDANSETRON HCL 8 MG PO TABS
ORAL_TABLET | ORAL | Status: AC
  Filled 2014-07-12: qty 1

## 2014-07-12 MED ORDER — ONDANSETRON HCL 8 MG PO TABS
8.0000 mg | ORAL_TABLET | Freq: Once | ORAL | Status: AC
  Administered 2014-07-12: 8 mg via ORAL

## 2014-07-12 NOTE — Patient Instructions (Signed)
[facility] Discharge Instructions for Patients Receiving Chemotherapy  Today you received the following chemotherapy agents:  Velcade  To help prevent nausea and vomiting after your treatment, we encourage you to take your nausea medication as prescribed.   If you develop nausea and vomiting that is not controlled by your nausea medication, call the clinic.   BELOW ARE SYMPTOMS THAT SHOULD BE REPORTED IMMEDIATELY:  *FEVER GREATER THAN 100.5 F  *CHILLS WITH OR WITHOUT FEVER  NAUSEA AND VOMITING THAT IS NOT CONTROLLED WITH YOUR NAUSEA MEDICATION  *UNUSUAL SHORTNESS OF BREATH  *UNUSUAL BRUISING OR BLEEDING  TENDERNESS IN MOUTH AND THROAT WITH OR WITHOUT PRESENCE OF ULCERS  *URINARY PROBLEMS  *BOWEL PROBLEMS  UNUSUAL RASH Items with * indicate a potential emergency and should be followed up as soon as possible.  Feel free to call the clinic you have any questions or concerns. The clinic phone number is [phone].  Please show the CHEMO ALERT CARD at check-in to the Emergency Department and triage nurse.   

## 2014-07-26 ENCOUNTER — Telehealth: Payer: Self-pay | Admitting: Hematology and Oncology

## 2014-07-26 ENCOUNTER — Ambulatory Visit (HOSPITAL_BASED_OUTPATIENT_CLINIC_OR_DEPARTMENT_OTHER): Payer: Medicare Other | Admitting: Hematology and Oncology

## 2014-07-26 ENCOUNTER — Other Ambulatory Visit (HOSPITAL_BASED_OUTPATIENT_CLINIC_OR_DEPARTMENT_OTHER): Payer: Medicare Other

## 2014-07-26 ENCOUNTER — Ambulatory Visit (HOSPITAL_BASED_OUTPATIENT_CLINIC_OR_DEPARTMENT_OTHER): Payer: Medicare Other

## 2014-07-26 ENCOUNTER — Encounter: Payer: Self-pay | Admitting: Hematology and Oncology

## 2014-07-26 VITALS — BP 140/69 | HR 80 | Temp 97.7°F | Resp 18 | Ht 65.0 in | Wt 206.4 lb

## 2014-07-26 DIAGNOSIS — D630 Anemia in neoplastic disease: Secondary | ICD-10-CM

## 2014-07-26 DIAGNOSIS — D72818 Other decreased white blood cell count: Secondary | ICD-10-CM

## 2014-07-26 DIAGNOSIS — Z5112 Encounter for antineoplastic immunotherapy: Secondary | ICD-10-CM

## 2014-07-26 DIAGNOSIS — C7A090 Malignant carcinoid tumor of the bronchus and lung: Secondary | ICD-10-CM | POA: Diagnosis not present

## 2014-07-26 DIAGNOSIS — C9001 Multiple myeloma in remission: Secondary | ICD-10-CM

## 2014-07-26 DIAGNOSIS — Z9484 Stem cells transplant status: Secondary | ICD-10-CM | POA: Diagnosis not present

## 2014-07-26 DIAGNOSIS — D3A090 Benign carcinoid tumor of the bronchus and lung: Secondary | ICD-10-CM

## 2014-07-26 DIAGNOSIS — T451X5A Adverse effect of antineoplastic and immunosuppressive drugs, initial encounter: Secondary | ICD-10-CM

## 2014-07-26 DIAGNOSIS — G620 Drug-induced polyneuropathy: Secondary | ICD-10-CM | POA: Diagnosis not present

## 2014-07-26 DIAGNOSIS — D701 Agranulocytosis secondary to cancer chemotherapy: Secondary | ICD-10-CM

## 2014-07-26 DIAGNOSIS — C9000 Multiple myeloma not having achieved remission: Secondary | ICD-10-CM

## 2014-07-26 LAB — CBC WITH DIFFERENTIAL/PLATELET
BASO%: 0.3 % (ref 0.0–2.0)
Basophils Absolute: 0 10*3/uL (ref 0.0–0.1)
EOS ABS: 0.2 10*3/uL (ref 0.0–0.5)
EOS%: 5.7 % (ref 0.0–7.0)
HCT: 32.5 % — ABNORMAL LOW (ref 34.8–46.6)
HEMOGLOBIN: 10.5 g/dL — AB (ref 11.6–15.9)
LYMPH#: 0.8 10*3/uL — AB (ref 0.9–3.3)
LYMPH%: 23.9 % (ref 14.0–49.7)
MCH: 32.2 pg (ref 25.1–34.0)
MCHC: 32.3 g/dL (ref 31.5–36.0)
MCV: 99.7 fL (ref 79.5–101.0)
MONO#: 0.3 10*3/uL (ref 0.1–0.9)
MONO%: 8.2 % (ref 0.0–14.0)
NEUT%: 61.9 % (ref 38.4–76.8)
NEUTROS ABS: 2.2 10*3/uL (ref 1.5–6.5)
Platelets: 155 10*3/uL (ref 145–400)
RBC: 3.26 10*6/uL — ABNORMAL LOW (ref 3.70–5.45)
RDW: 14.9 % — ABNORMAL HIGH (ref 11.2–14.5)
WBC: 3.5 10*3/uL — ABNORMAL LOW (ref 3.9–10.3)

## 2014-07-26 LAB — COMPREHENSIVE METABOLIC PANEL (CC13)
ALT: 26 U/L (ref 0–55)
ANION GAP: 12 meq/L — AB (ref 3–11)
AST: 18 U/L (ref 5–34)
Albumin: 3.8 g/dL (ref 3.5–5.0)
Alkaline Phosphatase: 90 U/L (ref 40–150)
BUN: 22.5 mg/dL (ref 7.0–26.0)
CALCIUM: 9.7 mg/dL (ref 8.4–10.4)
CHLORIDE: 105 meq/L (ref 98–109)
CO2: 25 meq/L (ref 22–29)
Creatinine: 1.2 mg/dL — ABNORMAL HIGH (ref 0.6–1.1)
EGFR: 47 mL/min/{1.73_m2} — AB (ref >90)
Glucose: 142 mg/dl — ABNORMAL HIGH (ref 70–140)
POTASSIUM: 4.3 meq/L (ref 3.5–5.1)
SODIUM: 142 meq/L (ref 136–145)
TOTAL PROTEIN: 6.6 g/dL (ref 6.4–8.3)
Total Bilirubin: 0.47 mg/dL (ref 0.20–1.20)

## 2014-07-26 MED ORDER — ONDANSETRON HCL 8 MG PO TABS
8.0000 mg | ORAL_TABLET | Freq: Once | ORAL | Status: AC
  Administered 2014-07-26: 8 mg via ORAL

## 2014-07-26 MED ORDER — BORTEZOMIB CHEMO SQ INJECTION 3.5 MG (2.5MG/ML)
1.0400 mg/m2 | Freq: Once | INTRAMUSCULAR | Status: AC
  Administered 2014-07-26: 2 mg via SUBCUTANEOUS
  Filled 2014-07-26: qty 2

## 2014-07-26 MED ORDER — ONDANSETRON HCL 8 MG PO TABS
ORAL_TABLET | ORAL | Status: AC
  Filled 2014-07-26: qty 1

## 2014-07-26 NOTE — Patient Instructions (Signed)
[facility] Discharge Instructions for Patients Receiving Chemotherapy  Today you received the following chemotherapy agents Velcade. To help prevent nausea and vomiting after your treatment, we encourage you to take your nausea medication as directed.  If you develop nausea and vomiting that is not controlled by your nausea medication, call the clinic.   BELOW ARE SYMPTOMS THAT SHOULD BE REPORTED IMMEDIATELY:  *FEVER GREATER THAN 100.5 F  *CHILLS WITH OR WITHOUT FEVER  NAUSEA AND VOMITING THAT IS NOT CONTROLLED WITH YOUR NAUSEA MEDICATION  *UNUSUAL SHORTNESS OF BREATH  *UNUSUAL BRUISING OR BLEEDING  TENDERNESS IN MOUTH AND THROAT WITH OR WITHOUT PRESENCE OF ULCERS  *URINARY PROBLEMS  *BOWEL PROBLEMS  UNUSUAL RASH Items with * indicate a potential emergency and should be followed up as soon as possible.  Feel free to call the clinic you have any questions or concerns. The clinic phone number is [phone].  Please show the CHEMO ALERT CARD at check-in to the Emergency Department and triage nurse.    

## 2014-07-26 NOTE — Telephone Encounter (Signed)
pt called to confirm appt....pt ok and aware °

## 2014-07-26 NOTE — Telephone Encounter (Signed)
s.w. pt and advised 7.27 time change....pt ok and aware

## 2014-07-27 DIAGNOSIS — D701 Agranulocytosis secondary to cancer chemotherapy: Secondary | ICD-10-CM | POA: Insufficient documentation

## 2014-07-27 DIAGNOSIS — T451X5A Adverse effect of antineoplastic and immunosuppressive drugs, initial encounter: Secondary | ICD-10-CM | POA: Insufficient documentation

## 2014-07-27 NOTE — Assessment & Plan Note (Signed)
This is likely anemia of chronic disease. The patient denies recent history of bleeding such as epistaxis, hematuria or hematochezia. She is asymptomatic from the anemia. We will observe for now.  

## 2014-07-27 NOTE — Progress Notes (Signed)
[name] OFF[facility] PROGRESS NOTE  Patient Care Team: Maurice Small, MD as PCP - General (Family Medicine) Laurence Spates, MD as Attending Physician (Gastroenterology) Jerrell Belfast, MD as Attending Physician (Otolaryngology) Collene Gobble, MD as Attending Physician (Pulmonary Disease) Garry Heater, DO as Consulting Physician (Hematology)  SUMMARY OF ONCOLOGIC HISTORY:   Multiple myeloma in remission   05/27/2012 Initial Diagnosis Multiple myeloma, without mention of having achieved remission(203.00)   05/27/2012 Cancer Staging 1.  IgG kappa multiple myeloma. She presented in 05/2012 with anemia, Cr 0.8, Ca 9.6; no bone lytic lesion; LDH 140; beta2- microglobulin 1.86 (ref 1.01-1.73). Positive SPEP for M-spike at 1.69, IgG 2080, kappa 7.24, kappa:lambda ratio 24.97; UPEP positive   06/08/2012 Bone Marrow Biopsy Bone marrow biopsy on 06/08/12 showed 19% plasma cell; normal cytogenetics; myeloma FISH pending.    06/14/2012 Imaging Skeletal survey negative for bone lytic lesions.  Lung nodule apparent.   09/17/2012 Bone Marrow Biopsy Bone marrow biopsy showed 11% plasma cells.   10/18/2012 - 12/31/2012 Chemotherapy Started on VRD (Velcade 1.3 mg/m^2 [place] days 1,8 and 15; Revlimide 25 mg PO daily, on day 1 through 14; Decadron 40 mg PO Days 1, 8, 15).   Started on coumadin given RD.   01/14/2013 Bone Marrow Biopsy The bone marrow clot sections are normocellular toslightly hypercellular for age (approximately 40% overall). 1% plasma cells.   02/08/2013 Bone Marrow Transplant Auto Transplant at [facility] (Dr. Glorianne Manchester McIver).   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    07/14/2012 Imaging 1. Right lower lobe nodule is non hypermetabolic which suggests benign etiology considering the slow growing rate in 6 years or non FDG avid disease as seen with carcinoid tumors. Pending biopsy. No PET evidence of mediastinal nodal disease.    08/02/2012 Imaging MRI of brain negative.   08/12/2012 Surgery Robotic Lobectomy of R Lower lobe.    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     Surgical resection margins are negative.     Two benign lymph nodes.     pTa1N0    INTERVAL HISTORY: Please see below for problem oriented charting. She complained of worsening neuropathy in her feet and hands. Denies recent infection or new bone pain  REVIEW OF SYSTEMS:   Constitutional: Denies fevers, chills or abnormal weight loss Eyes: Denies blurriness of vision Ears, nose, mouth, throat, and face: Denies mucositis or sore throat Respiratory: Denies cough, dyspnea or wheezes Cardiovascular: Denies palpitation, chest discomfort or lower extremity swelling Gastrointestinal:  Denies nausea, heartburn or change in bowel habits Skin: Denies abnormal skin rashes Lymphatics: Denies new lymphadenopathy or easy bruising Neurological:Denies numbness, tingling or new weaknesses Behavioral/Psych: Mood is stable, no new changes  All other systems were reviewed with the patient and are negative.  I have reviewed the past medical history, past surgical history, social history and family history with the patient and they are unchanged from previous note.  ALLERGIES:  has No Known Allergies.  MEDICATIONS:  Current  Outpatient Prescriptions  Medication Sig Dispense Refill  . acyclovir (ZOVIRAX) 400 MG tablet Take 1 tablet (400 mg total) by mouth daily. 90 tablet 3  . buPROPion  (WELLBUTRIN XL) 300 MG 24 hr tablet Take 300 mg by mouth daily.    . cholecalciferol (VITAMIN D) 1000 UNITS tablet Take 2,000 Units by mouth daily.    Marland Kitchen esomeprazole (NEXIUM) 40 MG capsule Take 40 mg by mouth daily.     Marland Kitchen HYDROcodone-acetaminophen (NORCO/VICODIN) 5-325 MG per tablet Take 1-2 tablets by mouth every 6 (six) hours as needed. 15 tablet 0  . losartan (COZAAR) 50 MG tablet Take 50 mg by mouth daily.    . magnesium chloride (SLOW-MAG) 64 MG TBEC SR tablet Take 1 tablet by mouth 2 (two) times daily.     . metFORMIN (GLUCOPHAGE) 500 MG tablet TAKE 1 TABLET (500 MG TOTAL) BY MOUTH 2 (TWO) TIMES DAILY WITH A MEAL. 60 tablet 1  . metoprolol succinate (TOPROL-XL) 50 MG 24 hr tablet Take 50 mg by mouth daily. Take with or immediately following a meal.    . Multiple Vitamins-Minerals (MULTIVITAMIN WITH MINERALS) tablet Take 1 tablet by mouth at bedtime.     . Multiple Vitamins-Minerals (PRESERVISION AREDS 2) CAPS Take 1 capsule by mouth 2 (two) times daily.    . sertraline (ZOLOFT) 100 MG tablet Take 100 mg by mouth daily.     . simvastatin (ZOCOR) 40 MG tablet Take 40 mg by mouth at bedtime.      No current facility-administered medications for this visit.    PHYSICAL EXAMINATION: ECOG PERFORMANCE STATUS: 1 - Symptomatic but completely ambulatory  Filed Vitals:   07/26/14 1152  BP: 140/69  Pulse: 80  Temp: 97.7 F (36.5 C)  Resp: 18   Filed Weights   07/26/14 1152  Weight: 206 lb 6.4 oz (93.622 kg)    GENERAL:alert, no distress and comfortable SKIN: skin color, texture, turgor are normal, no rashes or significant lesions EYES: normal, Conjunctiva are pink and non-injected, sclera clear OROPHARYNX:no exudate, no erythema and lips, buccal mucosa, and tongue normal  NECK: supple, thyroid normal size, non-tender, without nodularity LYMPH:  no palpable lymphadenopathy in the cervical, axillary or inguinal LUNGS: clear to auscultation and percussion with normal breathing  effort HEART: regular rate & rhythm and no murmurs and no lower extremity edema ABDOMEN:abdomen soft, non-tender and normal bowel sounds Musculoskeletal:no cyanosis of digits and no clubbing  NEURO: alert & oriented x 3 with fluent speech, no focal motor/sensory deficits  LABORATORY DATA:  I have reviewed the data as listed    Component Value Date/Time   NA 142 07/26/2014 1106   NA 141 03/15/2014 1741   K 4.3 07/26/2014 1106   K 4.5 03/15/2014 1741   CL 107 03/15/2014 1741   CL 105 06/08/2012 0913   CO2 25 07/26/2014 1106   CO2 29 10/10/2013 0726   GLUCOSE 142* 07/26/2014 1106   GLUCOSE 106* 03/15/2014 1741   GLUCOSE 120* 06/08/2012 0913   BUN 22.5 07/26/2014 1106   BUN 24* 03/15/2014 1741   CREATININE 1.2* 07/26/2014 1106   CREATININE 1.00 03/15/2014 1741   CALCIUM 9.7 07/26/2014 1106   CALCIUM 9.1 10/10/2013 0726   PROT 6.6 07/26/2014 1106   PROT 5.9* 10/08/2013 0927   ALBUMIN 3.8 07/26/2014 1106   ALBUMIN 2.2* 10/08/2013 0927   AST 18 07/26/2014 1106   AST 36 10/08/2013 0927   ALT 26 07/26/2014 1106   ALT 46* 10/08/2013 0927   ALKPHOS 90  07/26/2014 1106   ALKPHOS 178* 10/08/2013 0927   BILITOT 0.47 07/26/2014 1106   BILITOT 0.4 10/08/2013 0927   GFRNONAA 87* 10/10/2013 0726   GFRAA >90 10/10/2013 0726    No results found for: SPEP, UPEP  Lab Results  Component Value Date   WBC 3.5* 07/26/2014   NEUTROABS 2.2 07/26/2014   HGB 10.5* 07/26/2014   HCT 32.5* 07/26/2014   MCV 99.7 07/26/2014   PLT 155 07/26/2014      Chemistry      Component Value Date/Time   NA 142 07/26/2014 1106   NA 141 03/15/2014 1741   K 4.3 07/26/2014 1106   K 4.5 03/15/2014 1741   CL 107 03/15/2014 1741   CL 105 06/08/2012 0913   CO2 25 07/26/2014 1106   CO2 29 10/10/2013 0726   BUN 22.5 07/26/2014 1106   BUN 24* 03/15/2014 1741   CREATININE 1.2* 07/26/2014 1106   CREATININE 1.00 03/15/2014 1741      Component Value Date/Time   CALCIUM 9.7 07/26/2014 1106   CALCIUM 9.1  10/10/2013 0726   ALKPHOS 90 07/26/2014 1106   ALKPHOS 178* 10/08/2013 0927   AST 18 07/26/2014 1106   AST 36 10/08/2013 0927   ALT 26 07/26/2014 1106   ALT 46* 10/08/2013 0927   BILITOT 0.47 07/26/2014 1106   BILITOT 0.4 10/08/2013 0927       ASSESSMENT & PLAN:  Carcinoid tumor of left lung According to the patient's record, she had CT scan of the chest done at [place] and she is concerned about mild anomalies described. I reviewed the PET CT scan from February with the patient and showed she have persistent mild abnormality which I suspect could be related to prior carcinoid tumor/infection. I reviewed the CT from [facility] which confirmed it is related to recent infection I'm comfortable to wait until October for another repeat CT scan as she is not symptomatic.   Multiple myeloma in remission Unfortunately, she developed worsening neuropathy I plan to reduce the dose If her neuropathy is worse, we might have to discontinue treatment altogether and consider switching her to Revlimid maintenance  She will continue treatment for now along with Zometa monthly.    Anemia in neoplastic disease This is likely anemia of chronic disease. The patient denies recent history of bleeding such as epistaxis, hematuria or hematochezia. She is asymptomatic from the anemia. We will observe for now.     Neuropathy due to chemotherapeutic drug As mentioned above, I will reduce the chemo dose I will contact her transplant doctor and inform him  Leukopenia due to antineoplastic chemotherapy This is likely due to recent treatment. The patient denies recent history of fevers, cough, chills, diarrhea or dysuria. She is asymptomatic from the leukopenia. I will observe for now.   H/O autologous stem cell transplant She is due for her post transplant vaccination  We will administer in this visit.       No orders of the defined types were placed in this encounter.   All questions  were answered. The patient knows to call the clinic with any problems, questions or concerns. No barriers to learning was detected. I spent 30 minutes counseling the patient face to face. The total time spent in the appointment was 40 minutes and more than 50% was on counseling and review of test results     [facility], [place], MD 07/27/2014 11:51 PM

## 2014-07-27 NOTE — Assessment & Plan Note (Signed)
This is likely due to recent treatment. The patient denies recent history of fevers, cough, chills, diarrhea or dysuria. She is asymptomatic from the leukopenia. I will observe for now.    

## 2014-07-27 NOTE — Assessment & Plan Note (Signed)
As mentioned above, I will reduce the chemo dose I will contact her transplant doctor and inform him

## 2014-07-27 NOTE — Assessment & Plan Note (Signed)
She is due for her post transplant vaccination  We will administer in this visit.

## 2014-07-27 NOTE — Assessment & Plan Note (Signed)
According to the patient's record, she had CT scan of the chest done at [facility] and she is concerned about mild anomalies described. I reviewed the PET CT scan from February with the patient and showed she have persistent mild abnormality which I suspect could be related to prior carcinoid tumor/infection. I reviewed the CT from [facility] which confirmed it is related to recent infection I'm comfortable to wait until October for another repeat CT scan as she is not symptomatic.

## 2014-07-27 NOTE — Assessment & Plan Note (Signed)
Unfortunately, she developed worsening neuropathy I plan to reduce the dose If her neuropathy is worse, we might have to discontinue treatment altogether and consider switching her to Revlimid maintenance  She will continue treatment for now along with Zometa monthly.

## 2014-07-28 ENCOUNTER — Other Ambulatory Visit: Payer: Self-pay | Admitting: Hematology and Oncology

## 2014-08-09 ENCOUNTER — Ambulatory Visit (HOSPITAL_BASED_OUTPATIENT_CLINIC_OR_DEPARTMENT_OTHER): Payer: Medicare Other

## 2014-08-09 ENCOUNTER — Other Ambulatory Visit (HOSPITAL_BASED_OUTPATIENT_CLINIC_OR_DEPARTMENT_OTHER): Payer: Medicare Other

## 2014-08-09 VITALS — BP 153/73 | HR 80 | Temp 98.3°F | Resp 18

## 2014-08-09 DIAGNOSIS — C9001 Multiple myeloma in remission: Secondary | ICD-10-CM

## 2014-08-09 DIAGNOSIS — Z5112 Encounter for antineoplastic immunotherapy: Secondary | ICD-10-CM | POA: Diagnosis present

## 2014-08-09 DIAGNOSIS — C9000 Multiple myeloma not having achieved remission: Secondary | ICD-10-CM

## 2014-08-09 LAB — CBC WITH DIFFERENTIAL/PLATELET
BASO%: 0.5 % (ref 0.0–2.0)
Basophils Absolute: 0 10*3/uL (ref 0.0–0.1)
EOS%: 3.3 % (ref 0.0–7.0)
Eosinophils Absolute: 0.1 10*3/uL (ref 0.0–0.5)
HCT: 32.4 % — ABNORMAL LOW (ref 34.8–46.6)
HGB: 10.5 g/dL — ABNORMAL LOW (ref 11.6–15.9)
LYMPH%: 20.6 % (ref 14.0–49.7)
MCH: 32.6 pg (ref 25.1–34.0)
MCHC: 32.3 g/dL (ref 31.5–36.0)
MCV: 101 fL (ref 79.5–101.0)
MONO#: 0.2 10*3/uL (ref 0.1–0.9)
MONO%: 7.7 % (ref 0.0–14.0)
NEUT#: 2.2 10*3/uL (ref 1.5–6.5)
NEUT%: 67.9 % (ref 38.4–76.8)
Platelets: 174 10*3/uL (ref 145–400)
RBC: 3.21 10*6/uL — ABNORMAL LOW (ref 3.70–5.45)
RDW: 15.5 % — ABNORMAL HIGH (ref 11.2–14.5)
WBC: 3.2 10*3/uL — ABNORMAL LOW (ref 3.9–10.3)
lymph#: 0.7 10*3/uL — ABNORMAL LOW (ref 0.9–3.3)

## 2014-08-09 LAB — COMPREHENSIVE METABOLIC PANEL (CC13)
ALT: 27 U/L (ref 0–55)
ANION GAP: 9 meq/L (ref 3–11)
AST: 21 U/L (ref 5–34)
Albumin: 3.7 g/dL (ref 3.5–5.0)
Alkaline Phosphatase: 76 U/L (ref 40–150)
BILIRUBIN TOTAL: 0.47 mg/dL (ref 0.20–1.20)
BUN: 18.3 mg/dL (ref 7.0–26.0)
CO2: 25 mEq/L (ref 22–29)
Calcium: 9.8 mg/dL (ref 8.4–10.4)
Chloride: 106 mEq/L (ref 98–109)
Creatinine: 1.2 mg/dL — ABNORMAL HIGH (ref 0.6–1.1)
EGFR: 48 mL/min/{1.73_m2} — ABNORMAL LOW (ref >90)
GLUCOSE: 168 mg/dL — AB (ref 70–140)
POTASSIUM: 4.5 meq/L (ref 3.5–5.1)
Sodium: 140 mEq/L (ref 136–145)
Total Protein: 6.5 g/dL (ref 6.4–8.3)

## 2014-08-09 MED ORDER — BORTEZOMIB CHEMO SQ INJECTION 3.5 MG (2.5MG/ML)
1.0400 mg/m2 | Freq: Once | INTRAMUSCULAR | Status: AC
  Administered 2014-08-09: 2 mg via SUBCUTANEOUS
  Filled 2014-08-09: qty 2

## 2014-08-09 MED ORDER — SODIUM CHLORIDE 0.9 % IV SOLN
Freq: Once | INTRAVENOUS | Status: AC
  Administered 2014-08-09: 14:00:00 via INTRAVENOUS

## 2014-08-09 MED ORDER — ONDANSETRON HCL 8 MG PO TABS
8.0000 mg | ORAL_TABLET | Freq: Once | ORAL | Status: AC
  Administered 2014-08-09: 8 mg via ORAL

## 2014-08-09 MED ORDER — ZOLEDRONIC ACID 4 MG/100ML IV SOLN
4.0000 mg | Freq: Once | INTRAVENOUS | Status: AC
  Administered 2014-08-09: 4 mg via INTRAVENOUS
  Filled 2014-08-09: qty 100

## 2014-08-09 MED ORDER — ONDANSETRON HCL 8 MG PO TABS
ORAL_TABLET | ORAL | Status: AC
  Filled 2014-08-09: qty 1

## 2014-08-09 NOTE — Patient Instructions (Signed)
[facility] Discharge Instructions for Patients Receiving Chemotherapy  Today you received the following chemotherapy agents Velcade. To help prevent nausea and vomiting after your treatment, we encourage you to take your nausea medication as directed.  If you develop nausea and vomiting that is not controlled by your nausea medication, call the clinic.   BELOW ARE SYMPTOMS THAT SHOULD BE REPORTED IMMEDIATELY:  *FEVER GREATER THAN 100.5 F  *CHILLS WITH OR WITHOUT FEVER  NAUSEA AND VOMITING THAT IS NOT CONTROLLED WITH YOUR NAUSEA MEDICATION  *UNUSUAL SHORTNESS OF BREATH  *UNUSUAL BRUISING OR BLEEDING  TENDERNESS IN MOUTH AND THROAT WITH OR WITHOUT PRESENCE OF ULCERS  *URINARY PROBLEMS  *BOWEL PROBLEMS  UNUSUAL RASH Items with * indicate a potential emergency and should be followed up as soon as possible.  Feel free to call the clinic you have any questions or concerns. The clinic phone number is [phone].  Please show the CHEMO ALERT CARD at check-in to the Emergency Department and triage nurse.    

## 2014-08-09 NOTE — Progress Notes (Signed)
Pt reports neuropathy has gotten better

## 2014-08-15 DIAGNOSIS — R399 Unspecified symptoms and signs involving the genitourinary system: Secondary | ICD-10-CM | POA: Diagnosis not present

## 2014-08-15 DIAGNOSIS — L989 Disorder of the skin and subcutaneous tissue, unspecified: Secondary | ICD-10-CM | POA: Diagnosis not present

## 2014-08-15 DIAGNOSIS — R0689 Other abnormalities of breathing: Secondary | ICD-10-CM | POA: Diagnosis not present

## 2014-08-23 ENCOUNTER — Telehealth: Payer: Self-pay | Admitting: Hematology and Oncology

## 2014-08-23 ENCOUNTER — Ambulatory Visit (HOSPITAL_BASED_OUTPATIENT_CLINIC_OR_DEPARTMENT_OTHER): Payer: Medicare Other | Admitting: Hematology and Oncology

## 2014-08-23 ENCOUNTER — Ambulatory Visit: Payer: Medicare Other

## 2014-08-23 ENCOUNTER — Encounter: Payer: Self-pay | Admitting: Hematology and Oncology

## 2014-08-23 ENCOUNTER — Other Ambulatory Visit: Payer: Medicare Other

## 2014-08-23 ENCOUNTER — Ambulatory Visit (HOSPITAL_BASED_OUTPATIENT_CLINIC_OR_DEPARTMENT_OTHER): Payer: Medicare Other

## 2014-08-23 ENCOUNTER — Other Ambulatory Visit (HOSPITAL_BASED_OUTPATIENT_CLINIC_OR_DEPARTMENT_OTHER): Payer: Medicare Other

## 2014-08-23 VITALS — BP 110/42 | HR 82 | Temp 97.8°F | Resp 18 | Ht 65.0 in | Wt 205.5 lb

## 2014-08-23 DIAGNOSIS — C9001 Multiple myeloma in remission: Secondary | ICD-10-CM

## 2014-08-23 DIAGNOSIS — N183 Chronic kidney disease, stage 3 unspecified: Secondary | ICD-10-CM | POA: Insufficient documentation

## 2014-08-23 DIAGNOSIS — G620 Drug-induced polyneuropathy: Secondary | ICD-10-CM | POA: Diagnosis not present

## 2014-08-23 DIAGNOSIS — Z5112 Encounter for antineoplastic immunotherapy: Secondary | ICD-10-CM | POA: Diagnosis present

## 2014-08-23 DIAGNOSIS — D630 Anemia in neoplastic disease: Secondary | ICD-10-CM

## 2014-08-23 DIAGNOSIS — I952 Hypotension due to drugs: Secondary | ICD-10-CM | POA: Diagnosis not present

## 2014-08-23 DIAGNOSIS — T451X5A Adverse effect of antineoplastic and immunosuppressive drugs, initial encounter: Secondary | ICD-10-CM

## 2014-08-23 DIAGNOSIS — C9000 Multiple myeloma not having achieved remission: Secondary | ICD-10-CM

## 2014-08-23 LAB — CBC WITH DIFFERENTIAL/PLATELET
BASO%: 0.5 % (ref 0.0–2.0)
BASOS ABS: 0 10*3/uL (ref 0.0–0.1)
EOS ABS: 0.2 10*3/uL (ref 0.0–0.5)
EOS%: 2.5 % (ref 0.0–7.0)
HCT: 31.8 % — ABNORMAL LOW (ref 34.8–46.6)
HGB: 10.6 g/dL — ABNORMAL LOW (ref 11.6–15.9)
LYMPH#: 1.1 10*3/uL (ref 0.9–3.3)
LYMPH%: 17.5 % (ref 14.0–49.7)
MCH: 33.5 pg (ref 25.1–34.0)
MCHC: 33.3 g/dL (ref 31.5–36.0)
MCV: 100.7 fL (ref 79.5–101.0)
MONO#: 0.4 10*3/uL (ref 0.1–0.9)
MONO%: 6.4 % (ref 0.0–14.0)
NEUT#: 4.4 10*3/uL (ref 1.5–6.5)
NEUT%: 73.1 % (ref 38.4–76.8)
Platelets: 214 10*3/uL (ref 145–400)
RBC: 3.16 10*6/uL — AB (ref 3.70–5.45)
RDW: 15.2 % — ABNORMAL HIGH (ref 11.2–14.5)
WBC: 6 10*3/uL (ref 3.9–10.3)

## 2014-08-23 LAB — COMPREHENSIVE METABOLIC PANEL (CC13)
ALBUMIN: 3.9 g/dL (ref 3.5–5.0)
ALK PHOS: 88 U/L (ref 40–150)
ALT: 31 U/L (ref 0–55)
ANION GAP: 10 meq/L (ref 3–11)
AST: 21 U/L (ref 5–34)
BUN: 24.6 mg/dL (ref 7.0–26.0)
CALCIUM: 9.6 mg/dL (ref 8.4–10.4)
CHLORIDE: 108 meq/L (ref 98–109)
CO2: 24 mEq/L (ref 22–29)
Creatinine: 1.4 mg/dL — ABNORMAL HIGH (ref 0.6–1.1)
EGFR: 40 mL/min/{1.73_m2} — ABNORMAL LOW (ref >90)
Glucose: 148 mg/dl — ABNORMAL HIGH (ref 70–140)
Potassium: 4.3 mEq/L (ref 3.5–5.1)
SODIUM: 141 meq/L (ref 136–145)
Total Bilirubin: 0.31 mg/dL (ref 0.20–1.20)
Total Protein: 6.9 g/dL (ref 6.4–8.3)

## 2014-08-23 MED ORDER — IXAZOMIB CITRATE 4 MG PO CAPS
ORAL_CAPSULE | ORAL | Status: DC
Start: 2014-08-23 — End: 2015-04-10

## 2014-08-23 MED ORDER — ONDANSETRON HCL 8 MG PO TABS
ORAL_TABLET | ORAL | Status: AC
  Filled 2014-08-23: qty 1

## 2014-08-23 MED ORDER — ONDANSETRON HCL 8 MG PO TABS
8.0000 mg | ORAL_TABLET | Freq: Once | ORAL | Status: AC
  Administered 2014-08-23: 8 mg via ORAL

## 2014-08-23 MED ORDER — BORTEZOMIB CHEMO SQ INJECTION 3.5 MG (2.5MG/ML)
1.0400 mg/m2 | Freq: Once | INTRAMUSCULAR | Status: AC
  Administered 2014-08-23: 2 mg via SUBCUTANEOUS
  Filled 2014-08-23: qty 2

## 2014-08-23 NOTE — Assessment & Plan Note (Signed)
As mentioned above, I will switch her treatment as above

## 2014-08-23 NOTE — Assessment & Plan Note (Signed)
This is stable Will observe carefully

## 2014-08-23 NOTE — Progress Notes (Signed)
[place] OFFICE PROGRESS NOTE  Patient Care Team: Maurice Small, MD as PCP - General (Family Medicine) Laurence Spates, MD as Attending Physician (Gastroenterology) Jerrell Belfast, MD as Attending Physician (Otolaryngology) Collene Gobble, MD as Attending Physician (Pulmonary Disease) Garry Heater, DO as Consulting Physician (Hematology)  SUMMARY OF ONCOLOGIC HISTORY:   Multiple myeloma in remission   05/27/2012 Initial Diagnosis Multiple myeloma, without mention of having achieved remission(203.00)   05/27/2012 Cancer Staging 1.  IgG kappa multiple myeloma. She presented in 05/2012 with anemia, Cr 0.8, Ca 9.6; no bone lytic lesion; LDH 140; beta2- microglobulin 1.86 (ref 1.01-1.73). Positive SPEP for M-spike at 1.69, IgG 2080, kappa 7.24, kappa:lambda ratio 24.97; UPEP positive   06/08/2012 Bone Marrow Biopsy Bone marrow biopsy on 06/08/12 showed 19% plasma cell; normal cytogenetics; myeloma FISH pending.    06/14/2012 Imaging Skeletal survey negative for bone lytic lesions.  Lung nodule apparent.   09/17/2012 Bone Marrow Biopsy Bone marrow biopsy showed 11% plasma cells.   10/18/2012 - 12/31/2012 Chemotherapy Started on VRD (Velcade 1.3 mg/m^2 [place] days 1,8 and 15; Revlimide 25 mg PO daily, on day 1 through 14; Decadron 40 mg PO Days 1, 8, 15).   Started on coumadin given RD.   01/14/2013 Bone Marrow Biopsy The bone marrow clot sections are normocellular toslightly hypercellular for age (approximately 40% overall). 1% plasma cells.   02/08/2013 Bone Marrow Transplant [facility] Transplant at [facility] (Dr. Glorianne Manchester McIver).   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    07/14/2012 Imaging 1. Right lower lobe nodule is non hypermetabolic which suggests benign etiology considering the slow growing rate in 6 years or non FDG avid disease as seen with carcinoid tumors. Pending biopsy. No PET evidence of mediastinal nodal disease.    08/02/2012 Imaging MRI of brain negative.   08/12/2012 Surgery Robotic Lobectomy of R Lower lobe.    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     Surgical resection margins are negative.     Two benign lymph nodes.     pTa1N0    INTERVAL HISTORY: Please see below for problem oriented charting. She is seen prior to treatment today She has persistent neuropathy Denies recent infections  REVIEW OF SYSTEMS:   Constitutional: Denies fevers, chills or abnormal weight loss Eyes: Denies blurriness of vision Ears, nose, mouth, throat, and face: Denies mucositis or sore throat Respiratory: Denies cough, dyspnea or wheezes Cardiovascular: Denies palpitation, chest discomfort or lower extremity swelling Gastrointestinal:  Denies nausea, heartburn or change in bowel habits Skin: Denies abnormal skin rashes Neurological:Denies numbness, tingling or new weaknesses Behavioral/Psych: Mood is stable, no new changes  All other systems were reviewed with the patient and are negative.  I have reviewed the past medical history, past surgical history, social history and family history with the patient and they are unchanged from previous note.  ALLERGIES:  has No Known Allergies.  MEDICATIONS:  Current Outpatient Prescriptions  Medication Sig Dispense  Refill  . acyclovir (ZOVIRAX) 400 MG tablet Take 1 tablet (400 mg total) by mouth daily. 90 tablet 3  . buPROPion (WELLBUTRIN XL) 300 MG 24 hr tablet Take  300 mg by mouth daily.    . cholecalciferol (VITAMIN D) 1000 UNITS tablet Take 2,000 Units by mouth daily.    Marland Kitchen esomeprazole (NEXIUM) 40 MG capsule Take 40 mg by mouth daily.     Marland Kitchen losartan (COZAAR) 50 MG tablet Take 50 mg by mouth daily.    . magnesium chloride (SLOW-MAG) 64 MG TBEC SR tablet Take 1 tablet by mouth 2 (two) times daily.     . metFORMIN (GLUCOPHAGE) 500 MG tablet TAKE 1 TABLET (500 MG TOTAL) BY MOUTH 2 (TWO) TIMES DAILY WITH A MEAL. 60 tablet 1  . metoprolol succinate (TOPROL-XL) 50 MG 24 hr tablet Take 50 mg by mouth daily. Take with or immediately following a meal.    . Multiple Vitamins-Minerals (MULTIVITAMIN WITH MINERALS) tablet Take 1 tablet by mouth at bedtime.     . Multiple Vitamins-Minerals (PRESERVISION AREDS 2) CAPS Take 1 capsule by mouth 2 (two) times daily.    . sertraline (ZOLOFT) 100 MG tablet Take 100 mg by mouth daily.     . simvastatin (ZOCOR) 40 MG tablet Take 40 mg by mouth at bedtime.     Marland Kitchen HYDROcodone-acetaminophen (NORCO/VICODIN) 5-325 MG per tablet Take 1-2 tablets by mouth every 6 (six) hours as needed. (Patient not taking: Reported on 08/23/2014) 15 tablet 0  . ixazomib citrate (NINLARO) 4 MG capsule Take on an empty stomach 1hr before or 2hrs after food. Take weekly X 3 weeks, off 1 week 3 capsule 6   No current facility-administered medications for this visit.    PHYSICAL EXAMINATION: ECOG PERFORMANCE STATUS: 1 - Symptomatic but completely ambulatory  Filed Vitals:   08/23/14 1127  BP: 110/42  Pulse: 82  Temp: 97.8 F (36.6 C)  Resp: 18   Filed Weights   08/23/14 1127  Weight: 205 lb 8 oz (93.214 kg)    GENERAL:alert, no distress and comfortable SKIN: skin color, texture, turgor are normal, no rashes or significant lesions EYES: normal, Conjunctiva are pink and non-injected, sclera clear OROPHARYNX:no exudate, no erythema and lips, buccal mucosa, and tongue normal  NECK: supple, thyroid normal size, non-tender, without  nodularity LYMPH:  no palpable lymphadenopathy in the cervical, axillary or inguinal LUNGS: clear to auscultation and percussion with normal breathing effort HEART: regular rate & rhythm and no murmurs and no lower extremity edema ABDOMEN:abdomen soft, non-tender and normal bowel sounds Musculoskeletal:no cyanosis of digits and no clubbing  NEURO: alert & oriented x 3 with fluent speech, no focal motor/sensory deficits  LABORATORY DATA:  I have reviewed the data as listed    Component Value Date/Time   NA 141 08/23/2014 1054   NA 141 03/15/2014 1741   K 4.3 08/23/2014 1054   K 4.5 03/15/2014 1741   CL 107 03/15/2014 1741   CL 105 06/08/2012 0913   CO2 24 08/23/2014 1054   CO2 29 10/10/2013 0726   GLUCOSE 148* 08/23/2014 1054   GLUCOSE 106* 03/15/2014 1741   GLUCOSE 120* 06/08/2012 0913   BUN 24.6 08/23/2014 1054   BUN 24* 03/15/2014 1741   CREATININE 1.4* 08/23/2014 1054   CREATININE 1.00 03/15/2014 1741   CALCIUM 9.6 08/23/2014 1054   CALCIUM 9.1 10/10/2013 0726   PROT 6.9 08/23/2014 1054   PROT 5.9* 10/08/2013 0927   ALBUMIN 3.9 08/23/2014 1054   ALBUMIN 2.2*  10/08/2013 0927   AST 21 08/23/2014 1054   AST 36 10/08/2013 0927   ALT 31 08/23/2014 1054   ALT 46* 10/08/2013 0927   ALKPHOS 88 08/23/2014 1054   ALKPHOS 178* 10/08/2013 0927   BILITOT 0.31 08/23/2014 1054   BILITOT 0.4 10/08/2013 0927   GFRNONAA 87* 10/10/2013 0726   GFRAA >90 10/10/2013 0726    No results found for: SPEP, UPEP  Lab Results  Component Value Date   WBC 6.0 08/23/2014   NEUTROABS 4.4 08/23/2014   HGB 10.6* 08/23/2014   HCT 31.8* 08/23/2014   MCV 100.7 08/23/2014   PLT 214 08/23/2014      Chemistry      Component Value Date/Time   NA 141 08/23/2014 1054   NA 141 03/15/2014 1741   K 4.3 08/23/2014 1054   K 4.5 03/15/2014 1741   CL 107 03/15/2014 1741   CL 105 06/08/2012 0913   CO2 24 08/23/2014 1054   CO2 29 10/10/2013 0726   BUN 24.6 08/23/2014 1054   BUN 24* 03/15/2014 1741    CREATININE 1.4* 08/23/2014 1054   CREATININE 1.00 03/15/2014 1741      Component Value Date/Time   CALCIUM 9.6 08/23/2014 1054   CALCIUM 9.1 10/10/2013 0726   ALKPHOS 88 08/23/2014 1054   ALKPHOS 178* 10/08/2013 0927   AST 21 08/23/2014 1054   AST 36 10/08/2013 0927   ALT 31 08/23/2014 1054   ALT 46* 10/08/2013 0927   BILITOT 0.31 08/23/2014 1054   BILITOT 0.4 10/08/2013 0927      ASSESSMENT & PLAN:  Multiple myeloma in remission I spoke with her transplant physician regarding switching her to oral Velcade due to increasing neuropathy The risks, benefits and side-effects are discussed and she agreed She wants to hold off starting treatment until she returns form her vacation; plan to start around 8/24 She will continue monthly Zometa   Anemia in neoplastic disease This is likely anemia of chronic disease. The patient denies recent history of bleeding such as epistaxis, hematuria or hematochezia. She is asymptomatic from the anemia. We will observe for now.       Neuropathy due to chemotherapeutic drug As mentioned above, I will switch her treatment as above   Chronic kidney disease (CKD), stage III (moderate) This is stable Will observe carefully  Hypotension due to drugs I recommend her to take her losartan at night   Orders Placed This Encounter  Procedures  . SPEP & IFE with QIG    Standing Status: Future     Number of Occurrences:      Standing Expiration Date: 09/27/2015  . Kappa/lambda light chains    Standing Status: Future     Number of Occurrences:      Standing Expiration Date: 09/27/2015   All questions were answered. The patient knows to call the clinic with any problems, questions or concerns. No barriers to learning was detected. I spent 30 minutes counseling the patient face to face. The total time spent in the appointment was 40 minutes and more than 50% was on counseling and review of test results     [facility], [place], MD 08/23/2014 10:30  PM

## 2014-08-23 NOTE — Telephone Encounter (Signed)
s.w. pt and confirmed Aug appt...she got her sched from chemo

## 2014-08-23 NOTE — Patient Instructions (Signed)
[facility] Discharge Instructions for Patients Receiving Chemotherapy  Today you received the following chemotherapy agents Velcade. To help prevent nausea and vomiting after your treatment, we encourage you to take your nausea medication as directed.  If you develop nausea and vomiting that is not controlled by your nausea medication, call the clinic.   BELOW ARE SYMPTOMS THAT SHOULD BE REPORTED IMMEDIATELY:  *FEVER GREATER THAN 100.5 F  *CHILLS WITH OR WITHOUT FEVER  NAUSEA AND VOMITING THAT IS NOT CONTROLLED WITH YOUR NAUSEA MEDICATION  *UNUSUAL SHORTNESS OF BREATH  *UNUSUAL BRUISING OR BLEEDING  TENDERNESS IN MOUTH AND THROAT WITH OR WITHOUT PRESENCE OF ULCERS  *URINARY PROBLEMS  *BOWEL PROBLEMS  UNUSUAL RASH Items with * indicate a potential emergency and should be followed up as soon as possible.  Feel free to call the clinic you have any questions or concerns. The clinic phone number is [phone].  Please show the CHEMO ALERT CARD at check-in to the Emergency Department and triage nurse.    

## 2014-08-23 NOTE — Assessment & Plan Note (Signed)
I spoke with her transplant physician regarding switching her to oral Velcade due to increasing neuropathy The risks, benefits and side-effects are discussed and she agreed She wants to hold off starting treatment until she returns form her vacation; plan to start around 8/24 She will continue monthly Zometa

## 2014-08-23 NOTE — Assessment & Plan Note (Signed)
This is likely anemia of chronic disease. The patient denies recent history of bleeding such as epistaxis, hematuria or hematochezia. She is asymptomatic from the anemia. We will observe for now.  

## 2014-08-23 NOTE — Assessment & Plan Note (Signed)
I recommend her to take her losartan at night

## 2014-08-23 NOTE — Telephone Encounter (Signed)
Added appt for pt will give sched

## 2014-08-24 ENCOUNTER — Telehealth: Payer: Self-pay | Admitting: *Deleted

## 2014-08-24 ENCOUNTER — Encounter: Payer: Self-pay | Admitting: Hematology and Oncology

## 2014-08-24 NOTE — Telephone Encounter (Signed)
S/w Samantha w/ [facility] called to clarify pt's Ninlaro dose.  She said part of the rx got cut off.   Read her the rx for 4 mg weekly x 3 weeks on and one week off,  Dispense #3.

## 2014-08-24 NOTE — Progress Notes (Signed)
I faxed [facility] [phone] req for asst with ninlaro

## 2014-08-29 ENCOUNTER — Encounter: Payer: Self-pay | Admitting: Hematology and Oncology

## 2014-08-29 NOTE — Progress Notes (Signed)
Per [facility] ninlaro approved 08/29/14-08/29/15 under medicare part d [id number]. I will send to medical records.

## 2014-08-30 ENCOUNTER — Ambulatory Visit: Payer: Medicare Other

## 2014-09-03 ENCOUNTER — Other Ambulatory Visit: Payer: Self-pay | Admitting: Hematology and Oncology

## 2014-09-04 ENCOUNTER — Telehealth: Payer: Self-pay | Admitting: *Deleted

## 2014-09-04 DIAGNOSIS — E119 Type 2 diabetes mellitus without complications: Secondary | ICD-10-CM

## 2014-09-04 MED ORDER — METFORMIN HCL 500 MG PO TABS: 500.0000 mg | ORAL_TABLET | Freq: Two times a day (BID) | ORAL | Status: DC

## 2014-09-04 NOTE — Telephone Encounter (Signed)
VM message from patient requesting refill on her Metformin. Also she wants to know when she needs to start her new chemo drug.  Is it supposed to be delivered today.

## 2014-09-04 NOTE — Telephone Encounter (Signed)
Refill for Metformin escribed to pt's pharmacy.  TC to patient and informed her that she will need to go to her PCP for ongoing diabetes  Management. Also instructed pt to start her chemo drug on 09/20/14 -after she returns from being out of town. Pt. Voiced understanding to instructions.

## 2014-09-04 NOTE — Telephone Encounter (Signed)
Okay to refill metformin one more time. Please ask her to get PCP to manage DM Per my previous note, due to her planned vacation, we plan to start new Rx on 8/24

## 2014-09-04 NOTE — Telephone Encounter (Signed)
Done

## 2014-09-04 NOTE — Telephone Encounter (Signed)
[facility] sent facsimile confirmation of Nilaro prescription shipment.  Ninlaro was shipped on 09-01-2014 with next business day delivery.

## 2014-09-06 ENCOUNTER — Ambulatory Visit: Payer: Medicare Other

## 2014-09-06 ENCOUNTER — Other Ambulatory Visit: Payer: Medicare Other

## 2014-09-20 ENCOUNTER — Other Ambulatory Visit: Payer: Medicare Other

## 2014-09-20 ENCOUNTER — Ambulatory Visit: Payer: Medicare Other

## 2014-09-25 ENCOUNTER — Other Ambulatory Visit (HOSPITAL_BASED_OUTPATIENT_CLINIC_OR_DEPARTMENT_OTHER): Payer: Medicare Other

## 2014-09-25 ENCOUNTER — Telehealth: Payer: Self-pay | Admitting: Hematology and Oncology

## 2014-09-25 ENCOUNTER — Encounter: Payer: Self-pay | Admitting: Hematology and Oncology

## 2014-09-25 ENCOUNTER — Ambulatory Visit (HOSPITAL_BASED_OUTPATIENT_CLINIC_OR_DEPARTMENT_OTHER): Payer: Medicare Other

## 2014-09-25 ENCOUNTER — Ambulatory Visit (HOSPITAL_BASED_OUTPATIENT_CLINIC_OR_DEPARTMENT_OTHER): Payer: Medicare Other | Admitting: Hematology and Oncology

## 2014-09-25 VITALS — BP 129/60 | HR 80 | Temp 98.3°F | Resp 18 | Ht 65.0 in | Wt 205.9 lb

## 2014-09-25 DIAGNOSIS — C9001 Multiple myeloma in remission: Secondary | ICD-10-CM

## 2014-09-25 DIAGNOSIS — G620 Drug-induced polyneuropathy: Secondary | ICD-10-CM | POA: Diagnosis not present

## 2014-09-25 DIAGNOSIS — Z9484 Stem cells transplant status: Secondary | ICD-10-CM | POA: Diagnosis not present

## 2014-09-25 DIAGNOSIS — N183 Chronic kidney disease, stage 3 unspecified: Secondary | ICD-10-CM

## 2014-09-25 DIAGNOSIS — D630 Anemia in neoplastic disease: Secondary | ICD-10-CM

## 2014-09-25 DIAGNOSIS — D701 Agranulocytosis secondary to cancer chemotherapy: Secondary | ICD-10-CM

## 2014-09-25 DIAGNOSIS — T451X5A Adverse effect of antineoplastic and immunosuppressive drugs, initial encounter: Secondary | ICD-10-CM

## 2014-09-25 DIAGNOSIS — C9000 Multiple myeloma not having achieved remission: Secondary | ICD-10-CM

## 2014-09-25 LAB — CBC WITH DIFFERENTIAL/PLATELET
BASO%: 0.3 % (ref 0.0–2.0)
BASOS ABS: 0 10*3/uL (ref 0.0–0.1)
EOS ABS: 0.1 10*3/uL (ref 0.0–0.5)
EOS%: 2.9 % (ref 0.0–7.0)
HCT: 32.4 % — ABNORMAL LOW (ref 34.8–46.6)
HEMOGLOBIN: 10.6 g/dL — AB (ref 11.6–15.9)
LYMPH#: 0.8 10*3/uL — AB (ref 0.9–3.3)
LYMPH%: 22.8 % (ref 14.0–49.7)
MCH: 34 pg (ref 25.1–34.0)
MCHC: 32.7 g/dL (ref 31.5–36.0)
MCV: 103.8 fL — ABNORMAL HIGH (ref 79.5–101.0)
MONO#: 0.3 10*3/uL (ref 0.1–0.9)
MONO%: 7.2 % (ref 0.0–14.0)
NEUT#: 2.3 10*3/uL (ref 1.5–6.5)
NEUT%: 66.8 % (ref 38.4–76.8)
PLATELETS: 174 10*3/uL (ref 145–400)
RBC: 3.12 10*6/uL — ABNORMAL LOW (ref 3.70–5.45)
RDW: 14.3 % (ref 11.2–14.5)
WBC: 3.5 10*3/uL — ABNORMAL LOW (ref 3.9–10.3)

## 2014-09-25 LAB — COMPREHENSIVE METABOLIC PANEL (CC13)
ALBUMIN: 3.8 g/dL (ref 3.5–5.0)
ALK PHOS: 84 U/L (ref 40–150)
ALT: 33 U/L (ref 0–55)
AST: 25 U/L (ref 5–34)
Anion Gap: 8 mEq/L (ref 3–11)
BUN: 19.9 mg/dL (ref 7.0–26.0)
CALCIUM: 10.3 mg/dL (ref 8.4–10.4)
CHLORIDE: 105 meq/L (ref 98–109)
CO2: 27 mEq/L (ref 22–29)
CREATININE: 1.2 mg/dL — AB (ref 0.6–1.1)
EGFR: 48 mL/min/{1.73_m2} — ABNORMAL LOW (ref >90)
Glucose: 142 mg/dl — ABNORMAL HIGH (ref 70–140)
POTASSIUM: 4.7 meq/L (ref 3.5–5.1)
Sodium: 140 mEq/L (ref 136–145)
Total Bilirubin: 0.45 mg/dL (ref 0.20–1.20)
Total Protein: 6.8 g/dL (ref 6.4–8.3)

## 2014-09-25 MED ORDER — ZOLEDRONIC ACID 4 MG/100ML IV SOLN
4.0000 mg | Freq: Once | INTRAVENOUS | Status: AC
  Administered 2014-09-25: 4 mg via INTRAVENOUS
  Filled 2014-09-25: qty 100

## 2014-09-25 MED ORDER — SODIUM CHLORIDE 0.9 % IV SOLN
Freq: Once | INTRAVENOUS | Status: AC
  Administered 2014-09-25: 13:00:00 via INTRAVENOUS

## 2014-09-25 NOTE — Assessment & Plan Note (Signed)
This is likely anemia of chronic disease. The patient denies recent history of bleeding such as epistaxis, hematuria or hematochezia. She is asymptomatic from the anemia. We will observe for now.  

## 2014-09-25 NOTE — Progress Notes (Signed)
[place] OFFICE PROGRESS NOTE  Patient Care Team: Maurice Small, MD as PCP - General (Family Medicine) Laurence Spates, MD as Attending Physician (Gastroenterology) Jerrell Belfast, MD as Attending Physician (Otolaryngology) Collene Gobble, MD as Attending Physician (Pulmonary Disease) Garry Heater, DO as Consulting Physician (Hematology)  SUMMARY OF ONCOLOGIC HISTORY:   Multiple myeloma in remission   05/27/2012 Initial Diagnosis Multiple myeloma, without mention of having achieved remission(203.00)   05/27/2012 Cancer Staging 1.  IgG kappa multiple myeloma. She presented in 05/2012 with anemia, Cr 0.8, Ca 9.6; no bone lytic lesion; LDH 140; beta2- microglobulin 1.86 (ref 1.01-1.73). Positive SPEP for M-spike at 1.69, IgG 2080, kappa 7.24, kappa:lambda ratio 24.97; UPEP positive   06/08/2012 Bone Marrow Biopsy Bone marrow biopsy on 06/08/12 showed 19% plasma cell; normal cytogenetics; myeloma FISH pending.    06/14/2012 Imaging Skeletal survey negative for bone lytic lesions.  Lung nodule apparent.   09/17/2012 Bone Marrow Biopsy Bone marrow biopsy showed 11% plasma cells.   10/18/2012 - 12/31/2012 Chemotherapy Started on VRD (Velcade 1.3 mg/m^2 [place] days 1,8 and 15; Revlimide 25 mg PO daily, on day 1 through 14; Decadron 40 mg PO Days 1, 8, 15).   Started on coumadin given RD.   01/14/2013 Bone Marrow Biopsy The bone marrow clot sections are normocellular toslightly hypercellular for age (approximately 40% overall). 1% plasma cells.   02/08/2013 Bone Marrow Transplant Auto Transplant at [facility] (Dr. Glorianne Manchester McIver).   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    07/14/2012 Imaging 1. Right lower lobe nodule is non hypermetabolic which suggests benign etiology considering the slow growing rate in 6 years or non FDG avid disease as seen with carcinoid tumors. Pending biopsy. No PET evidence of mediastinal nodal disease.    08/02/2012 Imaging MRI of brain negative.   08/12/2012 Surgery Robotic Lobectomy of R Lower lobe.    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     Surgical resection margins are negative.     Two benign lymph nodes.     pTa1N0    INTERVAL HISTORY: Please see below for problem oriented charting. She returns for further follow-up. She is doing well. Neuropathy is slightly improved. She denies recent infection. No new bone pain. Her energy level is excellent. She have received the chemotherapy and plan to start tomorrow.  REVIEW OF SYSTEMS:   Constitutional: Denies fevers, chills or abnormal weight loss Eyes: Denies blurriness of vision Ears, nose, mouth, throat, and face: Denies mucositis or sore throat Respiratory: Denies cough, dyspnea or wheezes Cardiovascular: Denies palpitation, chest discomfort or lower extremity swelling Gastrointestinal:  Denies nausea, heartburn or change in bowel habits Skin: Denies abnormal skin rashes Lymphatics: Denies new lymphadenopathy or easy bruising Neurological:Denies numbness, tingling or new weaknesses Behavioral/Psych: Mood is stable, no new changes  All other systems were reviewed with the patient and are negative.  I have reviewed the past medical history, past surgical history, social history and  family history with the patient and they are unchanged from previous note.  ALLERGIES:  has No Known Allergies.  MEDICATIONS:  Current Outpatient  Prescriptions  Medication Sig Dispense Refill  . acyclovir (ZOVIRAX) 400 MG tablet Take 1 tablet (400 mg total) by mouth daily. 90 tablet 3  . buPROPion (WELLBUTRIN XL) 300 MG 24 hr tablet Take 300 mg by mouth daily.    . cholecalciferol (VITAMIN D) 1000 UNITS tablet Take 2,000 Units by mouth daily.    Marland Kitchen esomeprazole (NEXIUM) 40 MG capsule Take 40 mg by mouth daily.     . ixazomib citrate (NINLARO) 4 MG capsule Take on an empty stomach 1hr before or 2hrs after food. Take weekly X 3 weeks, off 1 week 3 capsule 6  . losartan (COZAAR) 50 MG tablet Take 50 mg by mouth daily.    . magnesium chloride (SLOW-MAG) 64 MG TBEC SR tablet Take 1 tablet by mouth 2 (two) times daily.     . metFORMIN (GLUCOPHAGE) 500 MG tablet Take 1 tablet (500 mg total) by mouth 2 (two) times daily with a meal. 60 tablet 0  . metoprolol succinate (TOPROL-XL) 50 MG 24 hr tablet Take 50 mg by mouth daily. Take with or immediately following a meal.    . Multiple Vitamins-Minerals (MULTIVITAMIN WITH MINERALS) tablet Take 1 tablet by mouth at bedtime.     . Multiple Vitamins-Minerals (PRESERVISION AREDS 2) CAPS Take 1 capsule by mouth 2 (two) times daily.    . sertraline (ZOLOFT) 100 MG tablet Take 100 mg by mouth daily.     . simvastatin (ZOCOR) 40 MG tablet Take 40 mg by mouth at bedtime.      No current facility-administered medications for this visit.    PHYSICAL EXAMINATION: ECOG PERFORMANCE STATUS: 0 - Asymptomatic  Filed Vitals:   09/25/14 1214  BP: 129/60  Pulse: 80  Temp: 98.3 F (36.8 C)  Resp: 18   Filed Weights   09/25/14 1214  Weight: 205 lb 14.4 oz (93.396 kg)    GENERAL:alert, no distress and comfortable SKIN: skin color, texture, turgor are normal, no rashes or significant lesions EYES: normal, Conjunctiva are pink and non-injected, sclera clear OROPHARYNX:no exudate, no erythema and lips, buccal mucosa, and tongue normal  NECK: supple, thyroid normal size, non-tender, without nodularity LYMPH:   no palpable lymphadenopathy in the cervical, axillary or inguinal LUNGS: clear to auscultation and percussion with normal breathing effort HEART: regular rate & rhythm and no murmurs and no lower extremity edema ABDOMEN:abdomen soft, non-tender and normal bowel sounds Musculoskeletal:no cyanosis of digits and no clubbing  NEURO: alert & oriented x 3 with fluent speech, no focal motor/sensory deficits  LABORATORY DATA:  I have reviewed the data as listed    Component Value Date/Time   NA 140 09/25/2014 1138   NA 141 03/15/2014 1741   K 4.7 09/25/2014 1138   K 4.5 03/15/2014 1741   CL 107 03/15/2014 1741   CL 105 06/08/2012 0913   CO2 27 09/25/2014 1138   CO2 29 10/10/2013 0726   GLUCOSE 142* 09/25/2014 1138   GLUCOSE 106* 03/15/2014 1741   GLUCOSE 120* 06/08/2012 0913   BUN 19.9 09/25/2014 1138   BUN 24* 03/15/2014 1741   CREATININE 1.2* 09/25/2014 1138   CREATININE 1.00 03/15/2014 1741   CALCIUM 10.3 09/25/2014 1138   CALCIUM 9.1 10/10/2013 0726   PROT 6.8 09/25/2014 1138   PROT 5.9* 10/08/2013 0927   ALBUMIN 3.8 09/25/2014 1138   ALBUMIN 2.2* 10/08/2013 3546  AST 25 09/25/2014 1138   AST 36 10/08/2013 0927   ALT 33 09/25/2014 1138   ALT 46* 10/08/2013 0927   ALKPHOS 84 09/25/2014 1138   ALKPHOS 178* 10/08/2013 0927   BILITOT 0.45 09/25/2014 1138   BILITOT 0.4 10/08/2013 0927   GFRNONAA 87* 10/10/2013 0726   GFRAA >90 10/10/2013 0726    No results found for: SPEP, UPEP  Lab Results  Component Value Date   WBC 3.5* 09/25/2014   NEUTROABS 2.3 09/25/2014   HGB 10.6* 09/25/2014   HCT 32.4* 09/25/2014   MCV 103.8* 09/25/2014   PLT 174 09/25/2014      Chemistry      Component Value Date/Time   NA 140 09/25/2014 1138   NA 141 03/15/2014 1741   K 4.7 09/25/2014 1138   K 4.5 03/15/2014 1741   CL 107 03/15/2014 1741   CL 105 06/08/2012 0913   CO2 27 09/25/2014 1138   CO2 29 10/10/2013 0726   BUN 19.9 09/25/2014 1138   BUN 24* 03/15/2014 1741   CREATININE  1.2* 09/25/2014 1138   CREATININE 1.00 03/15/2014 1741      Component Value Date/Time   CALCIUM 10.3 09/25/2014 1138   CALCIUM 9.1 10/10/2013 0726   ALKPHOS 84 09/25/2014 1138   ALKPHOS 178* 10/08/2013 0927   AST 25 09/25/2014 1138   AST 36 10/08/2013 0927   ALT 33 09/25/2014 1138   ALT 46* 10/08/2013 0927   BILITOT 0.45 09/25/2014 1138   BILITOT 0.4 10/08/2013 0927    ASSESSMENT & PLAN:  Multiple myeloma in remission I spoke with her transplant physician regarding switching her to oral Velcade due to increasing neuropathy The risks, benefits and side-effects are discussed and she agreed Plan to start tomorrow She will continue monthly Zometa today then switch to q 3 months     Leukopenia due to antineoplastic chemotherapy This is likely due to recent treatment. The patient denies recent history of fevers, cough, chills, diarrhea or dysuria. She is asymptomatic from the leukopenia. I will observe for now.     Neuropathy due to chemotherapeutic drug As mentioned above, I will switch her treatment as above Her neuropathy so far is stable and slightly improved since we stopped Velcade injection.     Chronic kidney disease (CKD), stage III (moderate) This is stable Will observe carefully while on Rx    Anemia in neoplastic disease This is likely anemia of chronic disease. The patient denies recent history of bleeding such as epistaxis, hematuria or hematochezia. She is asymptomatic from the anemia. We will observe for now.      H/O autologous stem cell transplant I will get the pharmacist to check and make sure that her vaccination record is up-to-date   No orders of the defined types were placed in this encounter.   All questions were answered. The patient knows to call the clinic with any problems, questions or concerns. No barriers to learning was detected. I spent 20 minutes counseling the patient face to face. The total time spent in the appointment was 30  minutes and more than 50% was on counseling and review of test results     [facility], Loyed Wilmes, MD 09/25/2014 2:07 PM

## 2014-09-25 NOTE — Assessment & Plan Note (Signed)
This is stable Will observe carefully while on Rx

## 2014-09-25 NOTE — Telephone Encounter (Signed)
Gave and printed appt sched and avs for pt for Sept °

## 2014-09-25 NOTE — Patient Instructions (Signed)
[place] Discharge Instructions for Patients  Today you received the following: Zometa   To help prevent nausea and vomiting after your treatment, we encourage you to take your nausea medication as directed.    If you develop nausea and vomiting that is not controlled by your nausea medication, call the clinic.   BELOW ARE SYMPTOMS THAT SHOULD BE REPORTED IMMEDIATELY:  *FEVER GREATER THAN 100.5 F  *CHILLS WITH OR WITHOUT FEVER  NAUSEA AND VOMITING THAT IS NOT CONTROLLED WITH YOUR NAUSEA MEDICATION  *UNUSUAL SHORTNESS OF BREATH  *UNUSUAL BRUISING OR BLEEDING  TENDERNESS IN MOUTH AND THROAT WITH OR WITHOUT PRESENCE OF ULCERS  *URINARY PROBLEMS  *BOWEL PROBLEMS  UNUSUAL RASH Items with * indicate a potential emergency and should be followed up as soon as possible.  Feel free to call the clinic you have any questions or concerns. The clinic phone number is [phone].  Please show the [place] at check-in to the Emergency Department and triage nurse.

## 2014-09-25 NOTE — Assessment & Plan Note (Signed)
I spoke with her transplant physician regarding switching her to oral Velcade due to increasing neuropathy The risks, benefits and side-effects are discussed and she agreed Plan to start tomorrow She will continue monthly Zometa today then switch to q 3 months

## 2014-09-25 NOTE — Assessment & Plan Note (Signed)
This is likely due to recent treatment. The patient denies recent history of fevers, cough, chills, diarrhea or dysuria. She is asymptomatic from the leukopenia. I will observe for now.    

## 2014-09-25 NOTE — Assessment & Plan Note (Signed)
I will get the pharmacist to check and make sure that her vaccination record is up-to-date

## 2014-09-25 NOTE — Assessment & Plan Note (Signed)
As mentioned above, I will switch her treatment as above Her neuropathy so far is stable and slightly improved since we stopped Velcade injection.

## 2014-09-27 LAB — SPEP & IFE WITH QIG
ALBUMIN ELP: 4.1 g/dL (ref 3.8–4.8)
Alpha-1-Globulin: 0.3 g/dL (ref 0.2–0.3)
Alpha-2-Globulin: 0.8 g/dL (ref 0.5–0.9)
Beta 2: 0.3 g/dL (ref 0.2–0.5)
Beta Globulin: 0.5 g/dL (ref 0.4–0.6)
GAMMA GLOBULIN: 0.6 g/dL — AB (ref 0.8–1.7)
IGA: 83 mg/dL (ref 69–380)
IGM, SERUM: 17 mg/dL — AB (ref 52–322)
IgG (Immunoglobin G), Serum: 763 mg/dL (ref 690–1700)
Total Protein, Serum Electrophoresis: 6.5 g/dL (ref 6.1–8.1)

## 2014-09-27 LAB — KAPPA/LAMBDA LIGHT CHAINS
Kappa free light chain: 1.37 mg/dL (ref 0.33–1.94)
Kappa:Lambda Ratio: 1.03 (ref 0.26–1.65)
Lambda Free Lght Chn: 1.33 mg/dL (ref 0.57–2.63)

## 2014-10-23 ENCOUNTER — Ambulatory Visit (HOSPITAL_BASED_OUTPATIENT_CLINIC_OR_DEPARTMENT_OTHER): Payer: Medicare Other

## 2014-10-23 ENCOUNTER — Other Ambulatory Visit (HOSPITAL_BASED_OUTPATIENT_CLINIC_OR_DEPARTMENT_OTHER): Payer: Medicare Other

## 2014-10-23 ENCOUNTER — Encounter: Payer: Self-pay | Admitting: Hematology and Oncology

## 2014-10-23 ENCOUNTER — Ambulatory Visit: Payer: Medicare Other

## 2014-10-23 ENCOUNTER — Telehealth: Payer: Self-pay | Admitting: Hematology and Oncology

## 2014-10-23 ENCOUNTER — Ambulatory Visit (HOSPITAL_BASED_OUTPATIENT_CLINIC_OR_DEPARTMENT_OTHER): Payer: Medicare Other | Admitting: Hematology and Oncology

## 2014-10-23 VITALS — BP 141/64 | HR 80 | Temp 98.1°F | Resp 18 | Ht 65.0 in | Wt 211.8 lb

## 2014-10-23 DIAGNOSIS — T451X5A Adverse effect of antineoplastic and immunosuppressive drugs, initial encounter: Secondary | ICD-10-CM

## 2014-10-23 DIAGNOSIS — C9000 Multiple myeloma not having achieved remission: Secondary | ICD-10-CM

## 2014-10-23 DIAGNOSIS — D701 Agranulocytosis secondary to cancer chemotherapy: Secondary | ICD-10-CM

## 2014-10-23 DIAGNOSIS — Z23 Encounter for immunization: Secondary | ICD-10-CM | POA: Diagnosis present

## 2014-10-23 DIAGNOSIS — C9001 Multiple myeloma in remission: Secondary | ICD-10-CM

## 2014-10-23 DIAGNOSIS — Z418 Encounter for other procedures for purposes other than remedying health state: Secondary | ICD-10-CM | POA: Diagnosis not present

## 2014-10-23 DIAGNOSIS — D630 Anemia in neoplastic disease: Secondary | ICD-10-CM | POA: Diagnosis not present

## 2014-10-23 DIAGNOSIS — Z299 Encounter for prophylactic measures, unspecified: Secondary | ICD-10-CM | POA: Insufficient documentation

## 2014-10-23 DIAGNOSIS — D72819 Decreased white blood cell count, unspecified: Secondary | ICD-10-CM

## 2014-10-23 LAB — COMPREHENSIVE METABOLIC PANEL (CC13)
ALT: 29 U/L (ref 0–55)
AST: 21 U/L (ref 5–34)
Albumin: 3.8 g/dL (ref 3.5–5.0)
Alkaline Phosphatase: 82 U/L (ref 40–150)
Anion Gap: 10 mEq/L (ref 3–11)
BUN: 22.5 mg/dL (ref 7.0–26.0)
CHLORIDE: 105 meq/L (ref 98–109)
CO2: 25 meq/L (ref 22–29)
CREATININE: 1.1 mg/dL (ref 0.6–1.1)
Calcium: 9.8 mg/dL (ref 8.4–10.4)
EGFR: 52 mL/min/{1.73_m2} — ABNORMAL LOW (ref >90)
Glucose: 115 mg/dl (ref 70–140)
Potassium: 4.1 mEq/L (ref 3.5–5.1)
Sodium: 141 mEq/L (ref 136–145)
Total Bilirubin: 0.46 mg/dL (ref 0.20–1.20)
Total Protein: 6.5 g/dL (ref 6.4–8.3)

## 2014-10-23 LAB — CBC WITH DIFFERENTIAL/PLATELET
BASO%: 0.4 % (ref 0.0–2.0)
Basophils Absolute: 0 10*3/uL (ref 0.0–0.1)
EOS%: 3.4 % (ref 0.0–7.0)
Eosinophils Absolute: 0.1 10*3/uL (ref 0.0–0.5)
HCT: 31 % — ABNORMAL LOW (ref 34.8–46.6)
HGB: 10.2 g/dL — ABNORMAL LOW (ref 11.6–15.9)
LYMPH%: 23 % (ref 14.0–49.7)
MCH: 33.4 pg (ref 25.1–34.0)
MCHC: 32.8 g/dL (ref 31.5–36.0)
MCV: 101.9 fL — ABNORMAL HIGH (ref 79.5–101.0)
MONO#: 0.4 10*3/uL (ref 0.1–0.9)
MONO%: 11.3 % (ref 0.0–14.0)
NEUT#: 2.2 10*3/uL (ref 1.5–6.5)
NEUT%: 61.9 % (ref 38.4–76.8)
Platelets: 164 10*3/uL (ref 145–400)
RBC: 3.04 10*6/uL — AB (ref 3.70–5.45)
RDW: 14.8 % — ABNORMAL HIGH (ref 11.2–14.5)
WBC: 3.6 10*3/uL — ABNORMAL LOW (ref 3.9–10.3)
lymph#: 0.8 10*3/uL — ABNORMAL LOW (ref 0.9–3.3)

## 2014-10-23 MED ORDER — INFLUENZA VAC SPLIT QUAD 0.5 ML IM SUSY
0.5000 mL | PREFILLED_SYRINGE | Freq: Once | INTRAMUSCULAR | Status: AC
  Administered 2014-10-23: 0.5 mL via INTRAMUSCULAR
  Filled 2014-10-23: qty 0.5

## 2014-10-23 NOTE — Progress Notes (Signed)
[name] OFFICE PROGRESS NOTE  Patient Care Team: Maurice Small, MD as PCP - General (Family Medicine) Laurence Spates, MD as Attending Physician (Gastroenterology) Jerrell Belfast, MD as Attending Physician (Otolaryngology) Collene Gobble, MD as Attending Physician (Pulmonary Disease) Garry Heater, DO as Consulting Physician (Hematology)  SUMMARY OF ONCOLOGIC HISTORY:   Multiple myeloma in remission   05/27/2012 Initial Diagnosis Multiple myeloma, without mention of having achieved remission(203.00)   05/27/2012 Cancer Staging 1.  IgG kappa multiple myeloma. She presented in 05/2012 with anemia, Cr 0.8, Ca 9.6; no bone lytic lesion; LDH 140; beta2- microglobulin 1.86 (ref 1.01-1.73). Positive SPEP for M-spike at 1.69, IgG 2080, kappa 7.24, kappa:lambda ratio 24.97; UPEP positive   06/08/2012 Bone Marrow Biopsy Bone marrow biopsy on 06/08/12 showed 19% plasma cell; normal cytogenetics; myeloma FISH pending.    06/14/2012 Imaging Skeletal survey negative for bone lytic lesions.  Lung nodule apparent.   09/17/2012 Bone Marrow Biopsy Bone marrow biopsy showed 11% plasma cells.   10/18/2012 - 12/31/2012 Chemotherapy Started on VRD (Velcade 1.3 mg/m^2  days 1,8 and 15; Revlimide 25 mg PO daily, on day 1 through 14; Decadron 40 mg PO Days 1, 8, 15).   Started on coumadin given RD.   01/14/2013 Bone Marrow Biopsy The bone marrow clot sections are normocellular toslightly hypercellular for age (approximately 40% overall). 1% plasma cells.   02/08/2013 Bone Marrow Transplant Auto Transplant at [facility] (Dr. Glorianne Manchester McIver).   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    07/14/2012 Imaging 1. Right lower lobe nodule is non hypermetabolic which suggests benign etiology considering the slow growing rate in 6 years or non FDG avid disease as seen with carcinoid tumors. Pending biopsy. No PET evidence of mediastinal nodal disease.    08/02/2012 Imaging MRI of brain negative.   08/12/2012 Surgery Robotic Lobectomy of R Lower lobe.    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     Surgical resection margins are negative.     Two benign lymph nodes.     pTa1N0    INTERVAL HISTORY: Please see below for problem oriented charting. She is seen for further follow-up. She feels well. Denies side effects from treatment. No recent peripheral neuropathy. She has occasional muscle pain with positional changes but it does not bother her. She denies recent infection. She complained of chronic fatigue. She have abnormal rash underneath her arms and is taking topical anti-fungal cream with improvement  REVIEW OF SYSTEMS:   Constitutional: Denies fevers, chills or abnormal weight loss Eyes: Denies blurriness of vision Ears, nose, mouth, throat, and face: Denies mucositis or sore throat Respiratory: Denies cough, dyspnea or wheezes Cardiovascular: Denies palpitation, chest discomfort or lower extremity swelling Gastrointestinal:  Denies nausea, heartburn or change in bowel habits Lymphatics: Denies new lymphadenopathy or easy bruising Neurological:Denies numbness, tingling or new weaknesses Behavioral/Psych: Mood is stable, no new changes  All other systems were reviewed with the patient and are  negative.  I have reviewed the past medical history, past surgical history, social history and family history with the patient and they are  unchanged from previous note.  ALLERGIES:  has No Known Allergies.  MEDICATIONS:  Current Outpatient Prescriptions  Medication Sig Dispense Refill  . acyclovir (ZOVIRAX) 400 MG tablet Take 1 tablet (400 mg total) by mouth daily. 90 tablet 3  . buPROPion (WELLBUTRIN XL) 300 MG 24 hr tablet Take 300 mg by mouth daily.    . cholecalciferol (VITAMIN D) 1000 UNITS tablet Take 2,000 Units by mouth daily.    . clotrimazole (LOTRIMIN) 1 % cream Apply 1 application topically 2 (two) times daily.    Marland Kitchen esomeprazole (NEXIUM) 40 MG capsule Take 40 mg by mouth daily.     . ixazomib citrate (NINLARO) 4 MG capsule Take on an empty stomach 1hr before or 2hrs after food. Take weekly X 3 weeks, off 1 week 3 capsule 6  . losartan (COZAAR) 50 MG tablet Take 50 mg by mouth daily.    . magnesium chloride (SLOW-MAG) 64 MG TBEC SR tablet Take 1 tablet by mouth 2 (two) times daily.     . metFORMIN (GLUCOPHAGE) 500 MG tablet Take 1 tablet (500 mg total) by mouth 2 (two) times daily with a meal. 60 tablet 0  . metoprolol succinate (TOPROL-XL) 50 MG 24 hr tablet Take 50 mg by mouth daily. Take with or immediately following a meal.    . Multiple Vitamins-Minerals (MULTIVITAMIN WITH MINERALS) tablet Take 1 tablet by mouth at bedtime.     . Multiple Vitamins-Minerals (PRESERVISION AREDS 2) CAPS Take 1 capsule by mouth 2 (two) times daily.    . sertraline (ZOLOFT) 100 MG tablet Take 100 mg by mouth daily.     . simvastatin (ZOCOR) 40 MG tablet Take 40 mg by mouth at bedtime.      No current facility-administered medications for this visit.    PHYSICAL EXAMINATION: ECOG PERFORMANCE STATUS: 0 - Asymptomatic  Filed Vitals:   10/23/14 1241  BP: 141/64  Pulse: 80  Temp: 98.1 F (36.7 C)  Resp: 18   Filed Weights   10/23/14 1241  Weight: 211 lb 12.8 oz (96.072 kg)    GENERAL:alert, no distress and comfortable SKIN: skin color, texture, turgor are normal, no rashes or significant lesions EYES: normal, Conjunctiva  are pink and non-injected, sclera clear OROPHARYNX:no exudate, no erythema and lips, buccal mucosa, and tongue normal  NECK: supple, thyroid normal size, non-tender, without nodularity LYMPH:  no palpable lymphadenopathy in the cervical, axillary or inguinal LUNGS: clear to auscultation and percussion with normal breathing effort HEART: regular rate & rhythm and no murmurs and no lower extremity edema ABDOMEN:abdomen soft, non-tender and normal bowel sounds Musculoskeletal:no cyanosis of digits and no clubbing  NEURO: alert & oriented x 3 with fluent speech, no focal motor/sensory deficits  LABORATORY DATA:  I have reviewed the data as listed    Component Value Date/Time   NA 141 10/23/2014 1231   NA 141 03/15/2014 1741   K 4.1 10/23/2014 1231   K 4.5 03/15/2014 1741   CL 107 03/15/2014 1741   CL 105 06/08/2012 0913   CO2 25 10/23/2014 1231   CO2 29 10/10/2013 0726   GLUCOSE 115 10/23/2014 1231   GLUCOSE 106* 03/15/2014 1741   GLUCOSE 120* 06/08/2012 0913   BUN 22.5 10/23/2014 1231   BUN 24* 03/15/2014 1741   CREATININE 1.1 10/23/2014 1231   CREATININE 1.00 03/15/2014 1741   CALCIUM 9.8  10/23/2014 1231   CALCIUM 9.1 10/10/2013 0726   PROT 6.5 10/23/2014 1231   PROT 5.9* 10/08/2013 0927   ALBUMIN 3.8 10/23/2014 1231   ALBUMIN 2.2* 10/08/2013 0927   AST 21 10/23/2014 1231   AST 36 10/08/2013 0927   ALT 29 10/23/2014 1231   ALT 46* 10/08/2013 0927   ALKPHOS 82 10/23/2014 1231   ALKPHOS 178* 10/08/2013 0927   BILITOT 0.46 10/23/2014 1231   BILITOT 0.4 10/08/2013 0927   GFRNONAA 87* 10/10/2013 0726   GFRAA >90 10/10/2013 0726    No results found for: SPEP, UPEP  Lab Results  Component Value Date   WBC 3.6* 10/23/2014   NEUTROABS 2.2 10/23/2014   HGB 10.2* 10/23/2014   HCT 31.0* 10/23/2014   MCV 101.9* 10/23/2014   PLT 164 10/23/2014      Chemistry      Component Value Date/Time   NA 141 10/23/2014 1231   NA 141 03/15/2014 1741   K 4.1 10/23/2014 1231   K  4.5 03/15/2014 1741   CL 107 03/15/2014 1741   CL 105 06/08/2012 0913   CO2 25 10/23/2014 1231   CO2 29 10/10/2013 0726   BUN 22.5 10/23/2014 1231   BUN 24* 03/15/2014 1741   CREATININE 1.1 10/23/2014 1231   CREATININE 1.00 03/15/2014 1741      Component Value Date/Time   CALCIUM 9.8 10/23/2014 1231   CALCIUM 9.1 10/10/2013 0726   ALKPHOS 82 10/23/2014 1231   ALKPHOS 178* 10/08/2013 0927   AST 21 10/23/2014 1231   AST 36 10/08/2013 0927   ALT 29 10/23/2014 1231   ALT 46* 10/08/2013 0927   BILITOT 0.46 10/23/2014 1231   BILITOT 0.4 10/08/2013 0927      ASSESSMENT & PLAN:  Multiple myeloma in remission I spoke with her transplant physician regarding switching her to oral Velcade due to increasing neuropathy She tolerated treatment well apart from very mild leukopenia and chronic anemia She will continue same treatment without dose adjustment. The next dose Zometa would be due in November.   Leukopenia due to antineoplastic chemotherapy This is likely due to recent treatment. The patient denies recent history of fevers, cough, chills, diarrhea or dysuria. She is asymptomatic from the leukopenia. I will observe for now.    Anemia in neoplastic disease This is likely anemia of chronic disease. The patient denies recent history of bleeding such as epistaxis, hematuria or hematochezia. She is asymptomatic from the anemia. We will observe for now.     Preventive measure We discussed the importance of preventive care and reviewed the vaccination programs. She does not have any prior allergic reactions to influenza vaccination. She agrees to proceed with influenza vaccination today and we will administer it today at the clinic.    Orders Placed This Encounter  Procedures  . SPEP & IFE with QIG    Standing Status: Future     Number of Occurrences:      Standing Expiration Date: 11/27/2015  . Kappa/lambda light chains    Standing Status: Future     Number of Occurrences:       Standing Expiration Date: 11/27/2015   All questions were answered. The patient knows to call the clinic with any problems, questions or concerns. No barriers to learning was detected. I spent 15 minutes counseling the patient face to face. The total time spent in the appointment was 20 minutes and more than 50% was on counseling and review of test results     [facility],  NI, MD 10/23/2014 1:22 PM

## 2014-10-23 NOTE — Assessment & Plan Note (Signed)
We discussed the importance of preventive care and reviewed the vaccination programs. She does not have any prior allergic reactions to influenza vaccination. She agrees to proceed with influenza vaccination today and we will administer it today at the clinic.  

## 2014-10-23 NOTE — Assessment & Plan Note (Signed)
I spoke with her transplant physician regarding switching her to oral Velcade due to increasing neuropathy She tolerated treatment well apart from very mild leukopenia and chronic anemia She will continue same treatment without dose adjustment. The next dose Zometa would be due in November.

## 2014-10-23 NOTE — Assessment & Plan Note (Signed)
This is likely anemia of chronic disease. The patient denies recent history of bleeding such as epistaxis, hematuria or hematochezia. She is asymptomatic from the anemia. We will observe for now.  

## 2014-10-23 NOTE — Assessment & Plan Note (Signed)
This is likely due to recent treatment. The patient denies recent history of fevers, cough, chills, diarrhea or dysuria. She is asymptomatic from the leukopenia. I will observe for now.    

## 2014-10-23 NOTE — Telephone Encounter (Signed)
Appointments made and avs printed for patient °

## 2014-11-01 DIAGNOSIS — M549 Dorsalgia, unspecified: Secondary | ICD-10-CM | POA: Diagnosis not present

## 2014-11-01 DIAGNOSIS — R197 Diarrhea, unspecified: Secondary | ICD-10-CM | POA: Diagnosis not present

## 2014-11-03 DIAGNOSIS — R197 Diarrhea, unspecified: Secondary | ICD-10-CM | POA: Diagnosis not present

## 2014-11-04 DIAGNOSIS — J029 Acute pharyngitis, unspecified: Secondary | ICD-10-CM | POA: Diagnosis not present

## 2014-11-04 DIAGNOSIS — J069 Acute upper respiratory infection, unspecified: Secondary | ICD-10-CM | POA: Diagnosis not present

## 2014-11-08 ENCOUNTER — Encounter: Payer: Self-pay | Admitting: Hematology and Oncology

## 2014-11-08 NOTE — Progress Notes (Signed)
Per [facility] ninlaro was shipped via fedex

## 2014-11-09 ENCOUNTER — Encounter: Payer: Self-pay | Admitting: Hematology and Oncology

## 2014-11-09 NOTE — Progress Notes (Signed)
I called pt to make her aware that her grant with [employer] has expired and asked if she would like to reapply.  She said she has assistance with [employer] for 1 year so she's being taken care of right now.  She has my number for any future financial needs.

## 2014-11-10 DIAGNOSIS — Z23 Encounter for immunization: Secondary | ICD-10-CM | POA: Diagnosis not present

## 2014-11-10 DIAGNOSIS — R05 Cough: Secondary | ICD-10-CM | POA: Diagnosis not present

## 2014-12-01 DIAGNOSIS — R918 Other nonspecific abnormal finding of lung field: Secondary | ICD-10-CM | POA: Diagnosis not present

## 2014-12-01 DIAGNOSIS — C3431 Malignant neoplasm of lower lobe, right bronchus or lung: Secondary | ICD-10-CM | POA: Diagnosis not present

## 2014-12-01 DIAGNOSIS — Z09 Encounter for follow-up examination after completed treatment for conditions other than malignant neoplasm: Secondary | ICD-10-CM | POA: Diagnosis not present

## 2014-12-18 ENCOUNTER — Telehealth: Payer: Self-pay | Admitting: Hematology and Oncology

## 2014-12-18 ENCOUNTER — Other Ambulatory Visit: Payer: Medicare Other

## 2014-12-18 NOTE — Telephone Encounter (Signed)
returned call and s.w pt dtr and r/s missed lab.Marland KitchenMarland Kitchen

## 2014-12-25 ENCOUNTER — Other Ambulatory Visit (HOSPITAL_BASED_OUTPATIENT_CLINIC_OR_DEPARTMENT_OTHER): Payer: Medicare Other

## 2014-12-25 DIAGNOSIS — C9001 Multiple myeloma in remission: Secondary | ICD-10-CM

## 2014-12-25 DIAGNOSIS — C9000 Multiple myeloma not having achieved remission: Secondary | ICD-10-CM

## 2014-12-25 LAB — COMPREHENSIVE METABOLIC PANEL (CC13)
ALK PHOS: 73 U/L (ref 40–150)
ALT: 20 U/L (ref 0–55)
ANION GAP: 10 meq/L (ref 3–11)
AST: 17 U/L (ref 5–34)
Albumin: 3.7 g/dL (ref 3.5–5.0)
BUN: 25.6 mg/dL (ref 7.0–26.0)
CO2: 23 meq/L (ref 22–29)
CREATININE: 1 mg/dL (ref 0.6–1.1)
Calcium: 9.8 mg/dL (ref 8.4–10.4)
Chloride: 108 mEq/L (ref 98–109)
EGFR: 56 mL/min/{1.73_m2} — ABNORMAL LOW (ref >90)
Glucose: 120 mg/dl (ref 70–140)
Potassium: 4.1 mEq/L (ref 3.5–5.1)
Sodium: 142 mEq/L (ref 136–145)
TOTAL PROTEIN: 7 g/dL (ref 6.4–8.3)
Total Bilirubin: 0.46 mg/dL (ref 0.20–1.20)

## 2014-12-25 LAB — CBC WITH DIFFERENTIAL/PLATELET
BASO%: 0.4 % (ref 0.0–2.0)
Basophils Absolute: 0 10*3/uL (ref 0.0–0.1)
EOS%: 1.6 % (ref 0.0–7.0)
Eosinophils Absolute: 0.1 10*3/uL (ref 0.0–0.5)
HEMATOCRIT: 32.1 % — AB (ref 34.8–46.6)
HEMOGLOBIN: 10.3 g/dL — AB (ref 11.6–15.9)
LYMPH#: 0.9 10*3/uL (ref 0.9–3.3)
LYMPH%: 23.3 % (ref 14.0–49.7)
MCH: 31.8 pg (ref 25.1–34.0)
MCHC: 32 g/dL (ref 31.5–36.0)
MCV: 99.4 fL (ref 79.5–101.0)
MONO#: 0.5 10*3/uL (ref 0.1–0.9)
MONO%: 11.6 % (ref 0.0–14.0)
NEUT%: 63.1 % (ref 38.4–76.8)
NEUTROS ABS: 2.5 10*3/uL (ref 1.5–6.5)
PLATELETS: 135 10*3/uL — AB (ref 145–400)
RBC: 3.23 10*6/uL — AB (ref 3.70–5.45)
RDW: 16 % — AB (ref 11.2–14.5)
WBC: 3.9 10*3/uL (ref 3.9–10.3)

## 2014-12-26 ENCOUNTER — Telehealth: Payer: Self-pay | Admitting: Hematology and Oncology

## 2014-12-26 NOTE — Telephone Encounter (Signed)
lvm for pt regarding to 11.30 appt cx and moved to 12.7 due to lab results will not be back

## 2014-12-27 ENCOUNTER — Ambulatory Visit: Payer: Medicare Other

## 2014-12-27 ENCOUNTER — Ambulatory Visit: Payer: Medicare Other | Admitting: Hematology and Oncology

## 2014-12-27 LAB — SPEP & IFE WITH QIG
Albumin ELP: 4 g/dL (ref 3.8–4.8)
Alpha-1-Globulin: 0.3 g/dL (ref 0.2–0.3)
Alpha-2-Globulin: 0.7 g/dL (ref 0.5–0.9)
BETA GLOBULIN: 0.5 g/dL (ref 0.4–0.6)
Beta 2: 0.3 g/dL (ref 0.2–0.5)
Gamma Globulin: 0.6 g/dL — ABNORMAL LOW (ref 0.8–1.7)
IGA: 73 mg/dL (ref 69–380)
IGG (IMMUNOGLOBIN G), SERUM: 776 mg/dL (ref 690–1700)
IgM, Serum: 14 mg/dL — ABNORMAL LOW (ref 52–322)
TOTAL PROTEIN, SERUM ELECTROPHOR: 6.4 g/dL (ref 6.1–8.1)

## 2014-12-27 LAB — KAPPA/LAMBDA LIGHT CHAINS
KAPPA LAMBDA RATIO: 1.19 (ref 0.26–1.65)
Kappa free light chain: 1.26 mg/dL (ref 0.33–1.94)
Lambda Free Lght Chn: 1.06 mg/dL (ref 0.57–2.63)

## 2015-01-02 DIAGNOSIS — G4700 Insomnia, unspecified: Secondary | ICD-10-CM | POA: Diagnosis not present

## 2015-01-02 DIAGNOSIS — F3341 Major depressive disorder, recurrent, in partial remission: Secondary | ICD-10-CM | POA: Diagnosis not present

## 2015-01-03 ENCOUNTER — Ambulatory Visit (HOSPITAL_BASED_OUTPATIENT_CLINIC_OR_DEPARTMENT_OTHER): Payer: Medicare Other | Admitting: Hematology and Oncology

## 2015-01-03 ENCOUNTER — Encounter: Payer: Self-pay | Admitting: Hematology and Oncology

## 2015-01-03 ENCOUNTER — Telehealth: Payer: Self-pay | Admitting: Hematology and Oncology

## 2015-01-03 ENCOUNTER — Ambulatory Visit (HOSPITAL_BASED_OUTPATIENT_CLINIC_OR_DEPARTMENT_OTHER): Payer: Medicare Other

## 2015-01-03 VITALS — BP 103/47 | HR 75

## 2015-01-03 VITALS — BP 96/39 | HR 81 | Temp 98.5°F | Resp 18 | Wt 205.8 lb

## 2015-01-03 DIAGNOSIS — I952 Hypotension due to drugs: Secondary | ICD-10-CM | POA: Diagnosis not present

## 2015-01-03 DIAGNOSIS — G629 Polyneuropathy, unspecified: Secondary | ICD-10-CM

## 2015-01-03 DIAGNOSIS — C7A090 Malignant carcinoid tumor of the bronchus and lung: Secondary | ICD-10-CM

## 2015-01-03 DIAGNOSIS — Z9484 Stem cells transplant status: Secondary | ICD-10-CM | POA: Diagnosis not present

## 2015-01-03 DIAGNOSIS — D630 Anemia in neoplastic disease: Secondary | ICD-10-CM | POA: Diagnosis not present

## 2015-01-03 DIAGNOSIS — C9001 Multiple myeloma in remission: Secondary | ICD-10-CM

## 2015-01-03 DIAGNOSIS — D3A090 Benign carcinoid tumor of the bronchus and lung: Secondary | ICD-10-CM

## 2015-01-03 MED ORDER — ZOLEDRONIC ACID 4 MG/100ML IV SOLN
4.0000 mg | Freq: Once | INTRAVENOUS | Status: AC
  Administered 2015-01-03: 4 mg via INTRAVENOUS
  Filled 2015-01-03: qty 100

## 2015-01-03 MED ORDER — SODIUM CHLORIDE 0.9 % IV SOLN
Freq: Once | INTRAVENOUS | Status: AC
  Administered 2015-01-03: 13:00:00 via INTRAVENOUS

## 2015-01-03 NOTE — Assessment & Plan Note (Signed)
I spoke with her transplant physician regarding switching her to oral Velcade due to increasing neuropathy She tolerated treatment well apart from very mild leukopenia and chronic anemia She will continue same treatment without dose adjustment. The next dose Zometa would be due in March.

## 2015-01-03 NOTE — Assessment & Plan Note (Signed)
She has intermittent low blood pressure and is symptomatic with fatigue. Her blood pressure monitoring with her primary care doctor yesterday also confirm low blood count. I recommend she takes metoprolol in the morning and take the Cozaar at nighttime. If her systolic blood pressure remain low, I recommend she reduces Cozaar to half a tablet and reassess in the next visit

## 2015-01-03 NOTE — Assessment & Plan Note (Signed)
She had recent follow-up at [facility] with CT scan of the chest dated 12/01/2014 showed no evidence of cancer recurrence. Would defer to her cardiothoracic surgeon for future follow-up.

## 2015-01-03 NOTE — Progress Notes (Signed)
[facility] OFFICE PROGRESS NOTE  Patient Care Team: Maurice Small, MD as PCP - General (Family Medicine) Laurence Spates, MD as Attending Physician (Gastroenterology) Jerrell Belfast, MD as Attending Physician (Otolaryngology) Collene Gobble, MD as Attending Physician (Pulmonary Disease) Garry Heater, DO as Consulting Physician (Hematology)  SUMMARY OF ONCOLOGIC HISTORY:   Multiple myeloma in remission ([place])   05/27/2012 Initial Diagnosis Multiple myeloma, without mention of having achieved remission(203.00)   05/27/2012 Cancer Staging 1.  IgG kappa multiple myeloma. She presented in 05/2012 with anemia, Cr 0.8, Ca 9.6; no bone lytic lesion; LDH 140; beta2- microglobulin 1.86 (ref 1.01-1.73). Positive SPEP for M-spike at 1.69, IgG 2080, kappa 7.24, kappa:lambda ratio 24.97; UPEP positive   06/08/2012 Bone Marrow Biopsy Bone marrow biopsy on 06/08/12 showed 19% plasma cell; normal cytogenetics; myeloma FISH pending.    06/14/2012 Imaging Skeletal survey negative for bone lytic lesions.  Lung nodule apparent.   09/17/2012 Bone Marrow Biopsy Bone marrow biopsy showed 11% plasma cells.   10/18/2012 - 12/31/2012 Chemotherapy Started on VRD (Velcade 1.3 mg/m^2 [place] days 1,8 and 15; Revlimide 25 mg PO daily, on day 1 through 14; Decadron 40 mg PO Days 1, 8, 15).   Started on coumadin given RD.   01/14/2013 Bone Marrow Biopsy The bone marrow clot sections are normocellular toslightly hypercellular for age (approximately 40% overall). 1% plasma cells.   02/08/2013 Bone Marrow Transplant Auto Transplant at [facility] (Dr. Glorianne Manchester McIver).   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    07/14/2012 Imaging 1. Right lower lobe nodule is non hypermetabolic which suggests benign etiology considering the slow growing rate in 6 years or non FDG avid disease as seen with carcinoid tumors. Pending biopsy. No PET evidence of mediastinal nodal disease.    08/02/2012 Imaging MRI of brain negative.   08/12/2012 Surgery Robotic Lobectomy of R Lower lobe.    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     Surgical resection margins are negative.     Two benign lymph nodes.     pTa1N0    INTERVAL HISTORY: Please see below for problem oriented charting. She returns for further follow-up. She feels well. Denies recent infection. No recent cough. She went to [facility] recently and had CT scan which showed no evidence of disease. She denies peripheral neuropathy. She complained of fatigue and weakness recently. Her blood pressure today and with her primary care doctor yesterday was low. She is eating well, denies any change in her oral intake or dehydration.  REVIEW OF SYSTEMS:   Constitutional: Denies fevers, chills or abnormal weight loss Eyes: Denies blurriness of vision Ears, nose, mouth, throat, and face: Denies mucositis or sore throat Respiratory: Denies cough, dyspnea or wheezes Cardiovascular: Denies palpitation, chest discomfort or lower extremity swelling Gastrointestinal:  Denies nausea, heartburn or change in bowel habits Skin: Denies abnormal skin rashes Lymphatics: Denies new lymphadenopathy or easy bruising Neurological:Denies numbness, tingling or new weaknesses  Behavioral/Psych: Mood is stable, no new changes  All other systems were reviewed with the patient and are negative.  I have reviewed the past medical history,  past surgical history, social history and family history with the patient and they are unchanged from previous note.  ALLERGIES:  has No Known Allergies.  MEDICATIONS:  Current Outpatient Prescriptions  Medication Sig Dispense Refill  . acyclovir (ZOVIRAX) 400 MG tablet Take 1 tablet (400 mg total) by mouth daily. 90 tablet 3  . amitriptyline (ELAVIL) 10 MG tablet Take 10 mg by mouth at bedtime.    Marland Kitchen buPROPion (WELLBUTRIN XL) 300 MG 24 hr tablet Take 300 mg by mouth daily.    . cholecalciferol (VITAMIN D) 1000 UNITS tablet Take 2,000 Units by mouth daily.    . clotrimazole (LOTRIMIN) 1 % cream Apply 1 application topically 2 (two) times daily.    Marland Kitchen esomeprazole (NEXIUM) 40 MG capsule Take 40 mg by mouth daily.     . ixazomib citrate (NINLARO) 4 MG capsule Take on an empty stomach 1hr before or 2hrs after food. Take weekly X 3 weeks, off 1 week 3 capsule 6  . losartan (COZAAR) 50 MG tablet Take 50 mg by mouth daily.    . magnesium chloride (SLOW-MAG) 64 MG TBEC SR tablet Take 1 tablet by mouth 2 (two) times daily.     . metFORMIN (GLUCOPHAGE) 500 MG tablet Take 1 tablet (500 mg total) by mouth 2 (two) times daily with a meal. 60 tablet 0  . metoprolol succinate (TOPROL-XL) 50 MG 24 hr tablet Take 50 mg by mouth daily. Take with or immediately following a meal.    . Multiple Vitamins-Minerals (MULTIVITAMIN WITH MINERALS) tablet Take 1 tablet by mouth at bedtime.     . Multiple Vitamins-Minerals (PRESERVISION AREDS 2) CAPS Take 1 capsule by mouth 2 (two) times daily.    . sertraline (ZOLOFT) 100 MG tablet Take 100 mg by mouth daily.     . simvastatin (ZOCOR) 40 MG tablet Take 40 mg by mouth at bedtime.      No current facility-administered medications for this visit.   Facility-Administered Medications Ordered in Other Visits  Medication Dose Route Frequency Provider Last Rate Last Dose  . 0.9 %  sodium chloride infusion   Intravenous Once Heath Lark, MD      . Zoledronic Acid (ZOMETA) 4 mg  IVPB  4 mg Intravenous Once Heath Lark, MD        PHYSICAL EXAMINATION: ECOG PERFORMANCE STATUS: 1 - Symptomatic but completely ambulatory  Filed Vitals:   01/03/15 1145 01/03/15 1146  BP: 83/71 96/39  Pulse: 81   Temp: 98.5 F (36.9 C)   Resp: 18    Filed Weights   01/03/15 1145  Weight: 205 lb 12.8 oz (93.35 kg)    GENERAL:alert, no distress and comfortable SKIN: skin color, texture, turgor are normal, no rashes or significant lesions EYES: normal, Conjunctiva are pink and non-injected, sclera clear OROPHARYNX:no exudate, no erythema and lips, buccal mucosa, and tongue normal  NECK: supple, thyroid normal size, non-tender, without nodularity LYMPH:  no palpable lymphadenopathy in the cervical, axillary or inguinal LUNGS: clear to auscultation and percussion with normal breathing effort HEART: regular rate & rhythm and no murmurs and no lower extremity edema ABDOMEN:abdomen soft, non-tender and normal bowel sounds Musculoskeletal:no cyanosis of digits and no clubbing  NEURO: alert & oriented x 3 with fluent speech, no focal motor/sensory deficits  LABORATORY DATA:  I have reviewed the data as listed  Component Value Date/Time   NA 142 12/25/2014 1540   NA 141 03/15/2014 1741   K 4.1 12/25/2014 1540   K 4.5 03/15/2014 1741   CL 107 03/15/2014 1741   CL 105 06/08/2012 0913   CO2 23 12/25/2014 1540   CO2 29 10/10/2013 0726   GLUCOSE 120 12/25/2014 1540   GLUCOSE 106* 03/15/2014 1741   GLUCOSE 120* 06/08/2012 0913   BUN 25.6 12/25/2014 1540   BUN 24* 03/15/2014 1741   CREATININE 1.0 12/25/2014 1540   CREATININE 1.00 03/15/2014 1741   CALCIUM 9.8 12/25/2014 1540   CALCIUM 9.1 10/10/2013 0726   PROT 7.0 12/25/2014 1540   PROT 5.9* 10/08/2013 0927   ALBUMIN 3.7 12/25/2014 1540   ALBUMIN 2.2* 10/08/2013 0927   AST 17 12/25/2014 1540   AST 36 10/08/2013 0927   ALT 20 12/25/2014 1540   ALT 46* 10/08/2013 0927   ALKPHOS 73 12/25/2014 1540   ALKPHOS 178* 10/08/2013  0927   BILITOT 0.46 12/25/2014 1540   BILITOT 0.4 10/08/2013 0927   GFRNONAA 87* 10/10/2013 0726   GFRAA >90 10/10/2013 0726    No results found for: SPEP, UPEP  Lab Results  Component Value Date   WBC 3.9 12/25/2014   NEUTROABS 2.5 12/25/2014   HGB 10.3* 12/25/2014   HCT 32.1* 12/25/2014   MCV 99.4 12/25/2014   PLT 135* 12/25/2014      Chemistry      Component Value Date/Time   NA 142 12/25/2014 1540   NA 141 03/15/2014 1741   K 4.1 12/25/2014 1540   K 4.5 03/15/2014 1741   CL 107 03/15/2014 1741   CL 105 06/08/2012 0913   CO2 23 12/25/2014 1540   CO2 29 10/10/2013 0726   BUN 25.6 12/25/2014 1540   BUN 24* 03/15/2014 1741   CREATININE 1.0 12/25/2014 1540   CREATININE 1.00 03/15/2014 1741      Component Value Date/Time   CALCIUM 9.8 12/25/2014 1540   CALCIUM 9.1 10/10/2013 0726   ALKPHOS 73 12/25/2014 1540   ALKPHOS 178* 10/08/2013 0927   AST 17 12/25/2014 1540   AST 36 10/08/2013 0927   ALT 20 12/25/2014 1540   ALT 46* 10/08/2013 0927   BILITOT 0.46 12/25/2014 1540   BILITOT 0.4 10/08/2013 0927      ASSESSMENT & PLAN:  Multiple myeloma in remission I spoke with her transplant physician regarding switching her to oral Velcade due to increasing neuropathy She tolerated treatment well apart from very mild leukopenia and chronic anemia She will continue same treatment without dose adjustment. The next dose Zometa would be due in March.     Carcinoid tumor of left lung She had recent follow-up at [facility] with CT scan of the chest dated 12/01/2014 showed no evidence of cancer recurrence. Would defer to her cardiothoracic surgeon for future follow-up.   H/O autologous stem cell transplant She will return next month to [facility] for her 2 years follow-up after autologous stem cell transplant. I next month, she will have completed 2 years worth of post transplant treatment. I will defer to her transplant physician for decision whether she should  remain on Ninlaro or to discontinue treatment. Her last 2 set of blood work showed that she has achieved complete remission  Anemia in neoplastic disease This is likely anemia of chronic disease. The patient denies recent history of bleeding such as epistaxis, hematuria or hematochezia. She is asymptomatic from the anemia. We will observe for now.  Hypotension due to drugs She has intermittent low blood pressure and is symptomatic with fatigue. Her blood pressure monitoring with her primary care doctor yesterday also confirm low blood count. I recommend she takes metoprolol in the morning and take the Cozaar at nighttime. If her systolic blood pressure remain low, I recommend she reduces Cozaar to half a tablet and reassess in the next visit   Orders Placed This Encounter  Procedures  . SPEP & IFE with QIG    Standing Status: Future     Number of Occurrences:      Standing Expiration Date: 02/07/2016  . Kappa/lambda light chains    Standing Status: Future     Number of Occurrences:      Standing Expiration Date: 02/07/2016   All questions were answered. The patient knows to call the clinic with any problems, questions or concerns. No barriers to learning was detected. I spent 25 minutes counseling the patient face to face. The total time spent in the appointment was 30 minutes and more than 50% was on counseling and review of test results     [facility], [place], MD 01/03/2015 12:47 PM

## 2015-01-03 NOTE — Assessment & Plan Note (Signed)
She will return next month to [facility] for her 2 years follow-up after autologous stem cell transplant. I next month, she will have completed 2 years worth of post transplant treatment. I will defer to her transplant physician for decision whether she should remain on Ninlaro or to discontinue treatment. Her last 2 set of blood work showed that she has achieved complete remission

## 2015-01-03 NOTE — Patient Instructions (Signed)

## 2015-01-03 NOTE — Telephone Encounter (Signed)
Gave and printd appt sched and avs for pt for march 2017

## 2015-01-03 NOTE — Assessment & Plan Note (Signed)
This is likely anemia of chronic disease. The patient denies recent history of bleeding such as epistaxis, hematuria or hematochezia. She is asymptomatic from the anemia. We will observe for now.  

## 2015-01-26 ENCOUNTER — Other Ambulatory Visit: Payer: Self-pay | Admitting: Hematology and Oncology

## 2015-02-16 ENCOUNTER — Encounter: Payer: Self-pay | Admitting: Hematology and Oncology

## 2015-02-16 DIAGNOSIS — G629 Polyneuropathy, unspecified: Secondary | ICD-10-CM | POA: Diagnosis not present

## 2015-02-16 DIAGNOSIS — C9001 Multiple myeloma in remission: Secondary | ICD-10-CM | POA: Diagnosis not present

## 2015-02-16 DIAGNOSIS — Z9484 Stem cells transplant status: Secondary | ICD-10-CM | POA: Diagnosis not present

## 2015-02-16 DIAGNOSIS — R5383 Other fatigue: Secondary | ICD-10-CM | POA: Diagnosis not present

## 2015-02-16 DIAGNOSIS — Z79899 Other long term (current) drug therapy: Secondary | ICD-10-CM | POA: Diagnosis not present

## 2015-02-16 DIAGNOSIS — C9000 Multiple myeloma not having achieved remission: Secondary | ICD-10-CM | POA: Diagnosis not present

## 2015-02-16 NOTE — Progress Notes (Signed)
Per [facility] niniaro shipped via fedex 02/15/15

## 2015-02-22 ENCOUNTER — Telehealth: Payer: Self-pay | Admitting: *Deleted

## 2015-02-22 NOTE — Telephone Encounter (Signed)
Pt called to inform Dr Alvy Bimler that Dr Harvel Ricks told her to stop the Ninlaro X 1 month to see if that will help neuropathy.

## 2015-03-12 ENCOUNTER — Telehealth: Payer: Self-pay | Admitting: *Deleted

## 2015-03-12 NOTE — Telephone Encounter (Signed)
Pt called thinks she missed a lab appt.  Informed pt she has not missed any appts here.  Her next lab is on 3/7 and Dr. Alvy Bimler and Zometa one week later on 3/14.  Pt confirmed appts.  She also wants Dr. Alvy Bimler to know that NP at [facility] recommended pt hold Ninlaro x 1 month d/t Neuropathy.   Pt last took Ninlaro on 1/19 and plans to resume on 2/23.   She says Neuropathy is "a little better" so far.

## 2015-03-23 DIAGNOSIS — R5383 Other fatigue: Secondary | ICD-10-CM | POA: Diagnosis not present

## 2015-03-23 DIAGNOSIS — E559 Vitamin D deficiency, unspecified: Secondary | ICD-10-CM | POA: Diagnosis not present

## 2015-03-23 DIAGNOSIS — E538 Deficiency of other specified B group vitamins: Secondary | ICD-10-CM | POA: Diagnosis not present

## 2015-03-23 DIAGNOSIS — K219 Gastro-esophageal reflux disease without esophagitis: Secondary | ICD-10-CM | POA: Diagnosis not present

## 2015-03-26 ENCOUNTER — Other Ambulatory Visit: Payer: Self-pay | Admitting: Family Medicine

## 2015-03-26 DIAGNOSIS — Z1231 Encounter for screening mammogram for malignant neoplasm of breast: Secondary | ICD-10-CM

## 2015-04-03 ENCOUNTER — Other Ambulatory Visit (HOSPITAL_BASED_OUTPATIENT_CLINIC_OR_DEPARTMENT_OTHER): Payer: Medicare Other

## 2015-04-03 DIAGNOSIS — C9001 Multiple myeloma in remission: Secondary | ICD-10-CM

## 2015-04-03 DIAGNOSIS — C9000 Multiple myeloma not having achieved remission: Secondary | ICD-10-CM | POA: Diagnosis not present

## 2015-04-03 LAB — COMPREHENSIVE METABOLIC PANEL
ALT: 39 U/L (ref 0–55)
ANION GAP: 10 meq/L (ref 3–11)
AST: 26 U/L (ref 5–34)
Albumin: 3.7 g/dL (ref 3.5–5.0)
Alkaline Phosphatase: 77 U/L (ref 40–150)
BILIRUBIN TOTAL: 0.46 mg/dL (ref 0.20–1.20)
BUN: 20.2 mg/dL (ref 7.0–26.0)
CALCIUM: 9.8 mg/dL (ref 8.4–10.4)
CO2: 27 meq/L (ref 22–29)
CREATININE: 0.9 mg/dL (ref 0.6–1.1)
Chloride: 104 mEq/L (ref 98–109)
EGFR: 69 mL/min/{1.73_m2} — ABNORMAL LOW (ref >90)
Glucose: 120 mg/dl (ref 70–140)
Potassium: 4.1 mEq/L (ref 3.5–5.1)
Sodium: 140 mEq/L (ref 136–145)
TOTAL PROTEIN: 6.4 g/dL (ref 6.4–8.3)

## 2015-04-03 LAB — CBC WITH DIFFERENTIAL/PLATELET
BASO%: 0.3 % (ref 0.0–2.0)
Basophils Absolute: 0 10*3/uL (ref 0.0–0.1)
EOS%: 1.9 % (ref 0.0–7.0)
Eosinophils Absolute: 0.1 10*3/uL (ref 0.0–0.5)
HEMATOCRIT: 32.3 % — AB (ref 34.8–46.6)
HGB: 10.4 g/dL — ABNORMAL LOW (ref 11.6–15.9)
LYMPH#: 0.7 10*3/uL — AB (ref 0.9–3.3)
LYMPH%: 22.9 % (ref 14.0–49.7)
MCH: 31.8 pg (ref 25.1–34.0)
MCHC: 32 g/dL (ref 31.5–36.0)
MCV: 99.3 fL (ref 79.5–101.0)
MONO#: 0.3 10*3/uL (ref 0.1–0.9)
MONO%: 10 % (ref 0.0–14.0)
NEUT%: 64.9 % (ref 38.4–76.8)
NEUTROS ABS: 2.1 10*3/uL (ref 1.5–6.5)
PLATELETS: 116 10*3/uL — AB (ref 145–400)
RBC: 3.26 10*6/uL — AB (ref 3.70–5.45)
RDW: 14.7 % — ABNORMAL HIGH (ref 11.2–14.5)
WBC: 3.2 10*3/uL — AB (ref 3.9–10.3)

## 2015-04-04 LAB — KAPPA/LAMBDA LIGHT CHAINS
IG KAPPA FREE LIGHT CHAIN: 11.43 mg/L (ref 3.30–19.40)
IG LAMBDA FREE LIGHT CHAIN: 7.35 mg/L (ref 5.71–26.30)
Kappa/Lambda FluidC Ratio: 1.56 (ref 0.26–1.65)

## 2015-04-05 LAB — MULTIPLE MYELOMA PANEL, SERUM
ALBUMIN SERPL ELPH-MCNC: 3.5 g/dL (ref 2.9–4.4)
ALPHA 1: 0.2 g/dL (ref 0.0–0.4)
ALPHA2 GLOB SERPL ELPH-MCNC: 0.7 g/dL (ref 0.4–1.0)
Albumin/Glob SerPl: 1.5 (ref 0.7–1.7)
B-Globulin SerPl Elph-Mcnc: 1.1 g/dL (ref 0.7–1.3)
Gamma Glob SerPl Elph-Mcnc: 0.5 g/dL (ref 0.4–1.8)
Globulin, Total: 2.5 g/dL (ref 2.2–3.9)
IGG (IMMUNOGLOBIN G), SERUM: 574 mg/dL — AB (ref 700–1600)
IGM (IMMUNOGLOBIN M), SRM: 13 mg/dL — AB (ref 26–217)
IgA, Qn, Serum: 81 mg/dL — ABNORMAL LOW (ref 87–352)
TOTAL PROTEIN: 6 g/dL (ref 6.0–8.5)

## 2015-04-10 ENCOUNTER — Ambulatory Visit: Payer: Medicare Other

## 2015-04-10 ENCOUNTER — Ambulatory Visit (HOSPITAL_BASED_OUTPATIENT_CLINIC_OR_DEPARTMENT_OTHER): Payer: Medicare Other | Admitting: Hematology and Oncology

## 2015-04-10 ENCOUNTER — Telehealth: Payer: Self-pay | Admitting: *Deleted

## 2015-04-10 ENCOUNTER — Telehealth: Payer: Self-pay | Admitting: Hematology and Oncology

## 2015-04-10 VITALS — BP 117/56 | HR 88 | Temp 97.7°F | Resp 17 | Ht 65.0 in | Wt 208.9 lb

## 2015-04-10 DIAGNOSIS — C9001 Multiple myeloma in remission: Secondary | ICD-10-CM

## 2015-04-10 DIAGNOSIS — T451X5A Adverse effect of antineoplastic and immunosuppressive drugs, initial encounter: Secondary | ICD-10-CM

## 2015-04-10 DIAGNOSIS — G629 Polyneuropathy, unspecified: Secondary | ICD-10-CM

## 2015-04-10 DIAGNOSIS — D61818 Other pancytopenia: Secondary | ICD-10-CM | POA: Diagnosis not present

## 2015-04-10 DIAGNOSIS — G620 Drug-induced polyneuropathy: Secondary | ICD-10-CM

## 2015-04-10 DIAGNOSIS — D3A090 Benign carcinoid tumor of the bronchus and lung: Secondary | ICD-10-CM

## 2015-04-10 NOTE — Telephone Encounter (Signed)
Ninlaro discontinued by Dr. Alvy Bimler.  Notified [facility].

## 2015-04-10 NOTE — Assessment & Plan Note (Signed)
I spoke with her transplant physician regarding switching her to oral Velcade due to increasing neuropathy Recent treatment has to be discontinued due to worsening neuropathy. We discussed the risks and benefits of discontinuation of treatment. She has receded almost 22 months of adjuvant, posttransplant maintenance treatment I recommend discontinuation of Ninlaro and lengthening her Zometa treatment to every 6 months. She agreed with the plan of care.

## 2015-04-10 NOTE — Assessment & Plan Note (Signed)
She is not symptomatic. Observe only. She will continue follow-up at [facility] 

## 2015-04-10 NOTE — Assessment & Plan Note (Signed)
This is due to recent treatment She is not symptomatic Observe only 

## 2015-04-10 NOTE — Progress Notes (Signed)
[place] OFFICE PROGRESS NOTE  Patient Care Team: Maurice Small, MD as PCP - General (Family Medicine) Laurence Spates, MD as Attending Physician (Gastroenterology) Jerrell Belfast, MD as Attending Physician (Otolaryngology) Collene Gobble, MD as Attending Physician (Pulmonary Disease) Garry Heater, DO as Consulting Physician (Hematology)  SUMMARY OF ONCOLOGIC HISTORY:   Multiple myeloma in remission ([place])   05/27/2012 Initial Diagnosis Multiple myeloma, without mention of having achieved remission(203.00)   05/27/2012 Cancer Staging 1.  IgG kappa multiple myeloma. She presented in 05/2012 with anemia, Cr 0.8, Ca 9.6; no bone lytic lesion; LDH 140; beta2- microglobulin 1.86 (ref 1.01-1.73). Positive SPEP for M-spike at 1.69, IgG 2080, kappa 7.24, kappa:lambda ratio 24.97; UPEP positive   06/08/2012 Bone Marrow Biopsy Bone marrow biopsy on 06/08/12 showed 19% plasma cell; normal cytogenetics; myeloma FISH pending.    06/14/2012 Imaging Skeletal survey negative for bone lytic lesions.  Lung nodule apparent.   09/17/2012 Bone Marrow Biopsy Bone marrow biopsy showed 11% plasma cells.   10/18/2012 - 12/31/2012 Chemotherapy Started on VRD (Velcade 1.3 mg/m^2 [place] days 1,8 and 15; Revlimide 25 mg PO daily, on day 1 through 14; Decadron 40 mg PO Days 1, 8, 15).   Started on coumadin given RD.   01/14/2013 Bone Marrow Biopsy The bone marrow clot sections are normocellular toslightly hypercellular for age (approximately 40% overall). 1% plasma cells.   02/08/2013 Bone Marrow Transplant Auto Transplant at [facility] (Dr. Glorianne Manchester McIver).   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    07/14/2012 Imaging 1. Right lower lobe nodule is non hypermetabolic which suggests benign etiology considering the slow growing rate in 6 years or non FDG avid disease as seen with carcinoid tumors. Pending biopsy. No PET evidence of mediastinal nodal disease.    08/02/2012 Imaging MRI of brain negative.   08/12/2012 Surgery Robotic Lobectomy of R Lower lobe.    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     Surgical resection margins are negative.     Two benign lymph nodes.     pTa1N0    INTERVAL HISTORY: Please see below for problem oriented charting. She returns for further follow-up. She discontinue Ninlaro since last month due to worsening neuropathy. Denies recent infection. The recent chest pain or shortness of breath  REVIEW OF SYSTEMS:   Constitutional: Denies fevers, chills or abnormal weight loss Eyes: Denies blurriness of vision Ears, nose, mouth, throat, and face: Denies mucositis or sore throat Respiratory: Denies cough, dyspnea or wheezes Cardiovascular: Denies palpitation, chest discomfort or lower extremity swelling Gastrointestinal:  Denies nausea, heartburn or change in bowel habits Skin: Denies abnormal skin rashes Lymphatics: Denies new lymphadenopathy or easy bruising Neurological:Denies numbness, tingling or new weaknesses Behavioral/Psych: Mood is stable, no new changes  All other systems were reviewed with the  patient and are negative.  I have reviewed the past medical history, past surgical history, social history and family history with the  patient and they are unchanged from previous note.  ALLERGIES:  has No Known Allergies.  MEDICATIONS:  Current Outpatient Prescriptions  Medication Sig Dispense Refill  . acyclovir (ZOVIRAX) 400 MG tablet Take 1 tablet (400 mg total) by mouth daily. 90 tablet 3  . amitriptyline (ELAVIL) 10 MG tablet Take 10 mg by mouth at bedtime.    Marland Kitchen buPROPion (WELLBUTRIN XL) 300 MG 24 hr tablet Take 300 mg by mouth daily.    . cholecalciferol (VITAMIN D) 1000 UNITS tablet Take 2,000 Units by mouth daily.    . clotrimazole (LOTRIMIN) 1 % cream Apply 1 application topically 2 (two) times daily.    Marland Kitchen losartan (COZAAR) 50 MG tablet Take 50 mg by mouth daily.    . magnesium chloride (SLOW-MAG) 64 MG TBEC SR tablet Take 1 tablet by mouth 2 (two) times daily.     . metFORMIN (GLUCOPHAGE) 500 MG tablet Take 1 tablet (500 mg total) by mouth 2 (two) times daily with a meal. 60 tablet 0  . metoprolol succinate (TOPROL-XL) 50 MG 24 hr tablet Take 50 mg by mouth daily. Take with or immediately following a meal.    . Multiple Vitamins-Minerals (MULTIVITAMIN WITH MINERALS) tablet Take 1 tablet by mouth at bedtime.     . Multiple Vitamins-Minerals (PRESERVISION AREDS 2) CAPS Take 1 capsule by mouth 2 (two) times daily.    . ranitidine (ZANTAC) 150 MG capsule Take 150 mg by mouth daily.    . sertraline (ZOLOFT) 100 MG tablet Take 100 mg by mouth daily.     . simvastatin (ZOCOR) 40 MG tablet Take 40 mg by mouth at bedtime.      No current facility-administered medications for this visit.    PHYSICAL EXAMINATION: ECOG PERFORMANCE STATUS: 0 - Asymptomatic  Filed Vitals:   04/10/15 1239  BP: 117/56  Pulse: 88  Temp: 97.7 F (36.5 C)  Resp: 17   Filed Weights   04/10/15 1239  Weight: 208 lb 14.4 oz (94.756 kg)    GENERAL:alert, no distress and comfortable SKIN: skin color, texture, turgor are normal, no rashes or significant lesions EYES: normal, Conjunctiva are pink and non-injected, sclera  clear OROPHARYNX:no exudate, no erythema and lips, buccal mucosa, and tongue normal  NECK: supple, thyroid normal size, non-tender, without nodularity LYMPH:  no palpable lymphadenopathy in the cervical, axillary or inguinal LUNGS: clear to auscultation and percussion with normal breathing effort HEART: regular rate & rhythm and no murmurs and no lower extremity edema ABDOMEN:abdomen soft, non-tender and normal bowel sounds Musculoskeletal:no cyanosis of digits and no clubbing  NEURO: alert & oriented x 3 with fluent speech, no focal motor/sensory deficits  LABORATORY DATA:  I have reviewed the data as listed    Component Value Date/Time   NA 140 04/03/2015 1229   NA 141 03/15/2014 1741   K 4.1 04/03/2015 1229   K 4.5 03/15/2014 1741   CL 107 03/15/2014 1741   CL 105 06/08/2012 0913   CO2 27 04/03/2015 1229   CO2 29 10/10/2013 0726   GLUCOSE 120 04/03/2015 1229   GLUCOSE 106* 03/15/2014 1741   GLUCOSE 120* 06/08/2012 0913   BUN 20.2 04/03/2015 1229   BUN 24* 03/15/2014 1741   CREATININE 0.9 04/03/2015 1229   CREATININE 1.00 03/15/2014 1741   CALCIUM 9.8 04/03/2015 1229   CALCIUM 9.1 10/10/2013 0726   PROT 6.4  04/03/2015 1229   PROT 6.0 04/03/2015 1228   PROT 5.9* 10/08/2013 0927   ALBUMIN 3.7 04/03/2015 1229   ALBUMIN 2.2* 10/08/2013 0927   AST 26 04/03/2015 1229   AST 36 10/08/2013 0927   ALT 39 04/03/2015 1229   ALT 46* 10/08/2013 0927   ALKPHOS 77 04/03/2015 1229   ALKPHOS 178* 10/08/2013 0927   BILITOT 0.46 04/03/2015 1229   BILITOT 0.4 10/08/2013 0927   GFRNONAA 87* 10/10/2013 0726   GFRAA >90 10/10/2013 0726    No results found for: SPEP, UPEP  Lab Results  Component Value Date   WBC 3.2* 04/03/2015   NEUTROABS 2.1 04/03/2015   HGB 10.4* 04/03/2015   HCT 32.3* 04/03/2015   MCV 99.3 04/03/2015   PLT 116* 04/03/2015      Chemistry      Component Value Date/Time   NA 140 04/03/2015 1229   NA 141 03/15/2014 1741   K 4.1 04/03/2015 1229   K 4.5  03/15/2014 1741   CL 107 03/15/2014 1741   CL 105 06/08/2012 0913   CO2 27 04/03/2015 1229   CO2 29 10/10/2013 0726   BUN 20.2 04/03/2015 1229   BUN 24* 03/15/2014 1741   CREATININE 0.9 04/03/2015 1229   CREATININE 1.00 03/15/2014 1741      Component Value Date/Time   CALCIUM 9.8 04/03/2015 1229   CALCIUM 9.1 10/10/2013 0726   ALKPHOS 77 04/03/2015 1229   ALKPHOS 178* 10/08/2013 0927   AST 26 04/03/2015 1229   AST 36 10/08/2013 0927   ALT 39 04/03/2015 1229   ALT 46* 10/08/2013 0927   BILITOT 0.46 04/03/2015 1229   BILITOT 0.4 10/08/2013 0927      ASSESSMENT & PLAN:  Multiple myeloma in remission I spoke with her transplant physician regarding switching her to oral Velcade due to increasing neuropathy Recent treatment has to be discontinued due to worsening neuropathy. We discussed the risks and benefits of discontinuation of treatment. She has receded almost 22 months of adjuvant, posttransplant maintenance treatment I recommend discontinuation of Ninlaro and lengthening her Zometa treatment to every 6 months. She agreed with the plan of care.  Acquired pancytopenia [facility]) This is due to recent treatment. She is not symptomatic. Observe only.  Carcinoid tumor of left lung She is not symptomatic. Observe only. She will continue follow-up at [facility]  Neuropathy due to chemotherapeutic drug This is due to recent treatment. I recommend discontinuation of treatment and observe for now. I suspect it will continue to improve in the new future   Orders Placed This Encounter  Procedures  . Kappa/lambda light chains    Standing Status: Future     Number of Occurrences:      Standing Expiration Date: 05/14/2016  . Multiple Myeloma Panel (SPEP&IFE w/QIG)    Standing Status: Future     Number of Occurrences:      Standing Expiration Date: 05/14/2016   All questions were answered. The patient knows to call the clinic with any problems, questions or concerns. No barriers  to learning was detected. I spent 15 minutes counseling the patient face to face. The total time spent in the appointment was 20 minutes and more than 50% was on counseling and review of test results     [facility], Nissan Frazzini, MD 04/10/2015 1:38 PM

## 2015-04-10 NOTE — Telephone Encounter (Signed)
Gave and printed appt sched and avs for pt for JUNE °

## 2015-04-10 NOTE — Assessment & Plan Note (Signed)
This is due to recent treatment. I recommend discontinuation of treatment and observe for now. I suspect it will continue to improve in the new future

## 2015-04-17 ENCOUNTER — Ambulatory Visit: Payer: Medicare Other

## 2015-04-18 DIAGNOSIS — R319 Hematuria, unspecified: Secondary | ICD-10-CM | POA: Diagnosis not present

## 2015-04-18 DIAGNOSIS — N3001 Acute cystitis with hematuria: Secondary | ICD-10-CM | POA: Diagnosis not present

## 2015-05-01 DIAGNOSIS — M8589 Other specified disorders of bone density and structure, multiple sites: Secondary | ICD-10-CM | POA: Diagnosis not present

## 2015-05-01 DIAGNOSIS — M859 Disorder of bone density and structure, unspecified: Secondary | ICD-10-CM | POA: Diagnosis not present

## 2015-05-18 DIAGNOSIS — L0390 Cellulitis, unspecified: Secondary | ICD-10-CM | POA: Diagnosis not present

## 2015-06-12 ENCOUNTER — Other Ambulatory Visit: Payer: Self-pay | Admitting: Hematology and Oncology

## 2015-06-15 DIAGNOSIS — L304 Erythema intertrigo: Secondary | ICD-10-CM | POA: Diagnosis not present

## 2015-07-10 ENCOUNTER — Other Ambulatory Visit (HOSPITAL_BASED_OUTPATIENT_CLINIC_OR_DEPARTMENT_OTHER): Payer: Medicare Other

## 2015-07-10 DIAGNOSIS — C9001 Multiple myeloma in remission: Secondary | ICD-10-CM | POA: Diagnosis not present

## 2015-07-11 ENCOUNTER — Other Ambulatory Visit: Payer: Self-pay | Admitting: Hematology and Oncology

## 2015-07-11 LAB — KAPPA/LAMBDA LIGHT CHAINS
IG LAMBDA FREE LIGHT CHAIN: 9.8 mg/L (ref 5.7–26.3)
Ig Kappa Free Light Chain: 12.3 mg/L (ref 3.3–19.4)
KAPPA/LAMBDA FLC RATIO: 1.26 (ref 0.26–1.65)

## 2015-07-12 LAB — MULTIPLE MYELOMA PANEL, SERUM
ALPHA2 GLOB SERPL ELPH-MCNC: 0.7 g/dL (ref 0.4–1.0)
Albumin SerPl Elph-Mcnc: 3.7 g/dL (ref 2.9–4.4)
Albumin/Glob SerPl: 1.5 (ref 0.7–1.7)
Alpha 1: 0.2 g/dL (ref 0.0–0.4)
B-Globulin SerPl Elph-Mcnc: 1.1 g/dL (ref 0.7–1.3)
Gamma Glob SerPl Elph-Mcnc: 0.6 g/dL (ref 0.4–1.8)
Globulin, Total: 2.6 g/dL (ref 2.2–3.9)
IGA/IMMUNOGLOBULIN A, SERUM: 83 mg/dL — AB (ref 87–352)
IGG (IMMUNOGLOBIN G), SERUM: 665 mg/dL — AB (ref 700–1600)
IGM (IMMUNOGLOBIN M), SRM: 16 mg/dL — AB (ref 26–217)
TOTAL PROTEIN: 6.3 g/dL (ref 6.0–8.5)

## 2015-07-17 ENCOUNTER — Telehealth: Payer: Self-pay | Admitting: Hematology and Oncology

## 2015-07-17 ENCOUNTER — Ambulatory Visit (HOSPITAL_BASED_OUTPATIENT_CLINIC_OR_DEPARTMENT_OTHER): Payer: Medicare Other

## 2015-07-17 ENCOUNTER — Ambulatory Visit (HOSPITAL_BASED_OUTPATIENT_CLINIC_OR_DEPARTMENT_OTHER): Payer: Medicare Other | Admitting: Hematology and Oncology

## 2015-07-17 ENCOUNTER — Encounter: Payer: Self-pay | Admitting: Hematology and Oncology

## 2015-07-17 ENCOUNTER — Other Ambulatory Visit: Payer: Self-pay | Admitting: Hematology and Oncology

## 2015-07-17 ENCOUNTER — Other Ambulatory Visit (HOSPITAL_BASED_OUTPATIENT_CLINIC_OR_DEPARTMENT_OTHER): Payer: Medicare Other

## 2015-07-17 VITALS — BP 148/63 | HR 83 | Temp 97.1°F | Resp 18 | Ht 65.0 in | Wt 205.6 lb

## 2015-07-17 DIAGNOSIS — G620 Drug-induced polyneuropathy: Secondary | ICD-10-CM

## 2015-07-17 DIAGNOSIS — Z515 Encounter for palliative care: Secondary | ICD-10-CM | POA: Diagnosis not present

## 2015-07-17 DIAGNOSIS — C9001 Multiple myeloma in remission: Secondary | ICD-10-CM

## 2015-07-17 DIAGNOSIS — D3A090 Benign carcinoid tumor of the bronchus and lung: Secondary | ICD-10-CM

## 2015-07-17 DIAGNOSIS — T451X5A Adverse effect of antineoplastic and immunosuppressive drugs, initial encounter: Secondary | ICD-10-CM

## 2015-07-17 DIAGNOSIS — Z9484 Stem cells transplant status: Secondary | ICD-10-CM

## 2015-07-17 DIAGNOSIS — D61818 Other pancytopenia: Secondary | ICD-10-CM

## 2015-07-17 LAB — CBC WITH DIFFERENTIAL/PLATELET
BASO%: 0.3 % (ref 0.0–2.0)
BASOS ABS: 0 10*3/uL (ref 0.0–0.1)
EOS%: 1.7 % (ref 0.0–7.0)
Eosinophils Absolute: 0.1 10*3/uL (ref 0.0–0.5)
HEMATOCRIT: 33.1 % — AB (ref 34.8–46.6)
HEMOGLOBIN: 10.8 g/dL — AB (ref 11.6–15.9)
LYMPH#: 0.8 10*3/uL — AB (ref 0.9–3.3)
LYMPH%: 26.3 % (ref 14.0–49.7)
MCH: 32.9 pg (ref 25.1–34.0)
MCHC: 32.6 g/dL (ref 31.5–36.0)
MCV: 100.9 fL (ref 79.5–101.0)
MONO#: 0.2 10*3/uL (ref 0.1–0.9)
MONO%: 8 % (ref 0.0–14.0)
NEUT%: 63.7 % (ref 38.4–76.8)
NEUTROS ABS: 1.9 10*3/uL (ref 1.5–6.5)
Platelets: 149 10*3/uL (ref 145–400)
RBC: 3.28 10*6/uL — ABNORMAL LOW (ref 3.70–5.45)
RDW: 13.8 % (ref 11.2–14.5)
WBC: 3 10*3/uL — AB (ref 3.9–10.3)

## 2015-07-17 LAB — COMPREHENSIVE METABOLIC PANEL
ALBUMIN: 3.6 g/dL (ref 3.5–5.0)
ALK PHOS: 73 U/L (ref 40–150)
ALT: 61 U/L — AB (ref 0–55)
AST: 34 U/L (ref 5–34)
Anion Gap: 9 mEq/L (ref 3–11)
BILIRUBIN TOTAL: 0.47 mg/dL (ref 0.20–1.20)
BUN: 15.8 mg/dL (ref 7.0–26.0)
CALCIUM: 9.9 mg/dL (ref 8.4–10.4)
CO2: 27 mEq/L (ref 22–29)
CREATININE: 0.9 mg/dL (ref 0.6–1.1)
Chloride: 107 mEq/L (ref 98–109)
EGFR: 67 mL/min/{1.73_m2} — ABNORMAL LOW (ref >90)
GLUCOSE: 137 mg/dL (ref 70–140)
POTASSIUM: 4.2 meq/L (ref 3.5–5.1)
SODIUM: 143 meq/L (ref 136–145)
TOTAL PROTEIN: 6.6 g/dL (ref 6.4–8.3)

## 2015-07-17 MED ORDER — SODIUM CHLORIDE 0.9 % IV SOLN
Freq: Once | INTRAVENOUS | Status: AC
  Administered 2015-07-17: 14:00:00 via INTRAVENOUS

## 2015-07-17 MED ORDER — ZOLEDRONIC ACID 4 MG/100ML IV SOLN
4.0000 mg | Freq: Once | INTRAVENOUS | Status: AC
  Administered 2015-07-17: 4 mg via INTRAVENOUS
  Filled 2015-07-17: qty 100

## 2015-07-17 NOTE — Assessment & Plan Note (Signed)
This is due to recent treatment. Since discontinuation of treatment, it is improving. Observe only.

## 2015-07-17 NOTE — Assessment & Plan Note (Signed)
I spoke with her transplant physician regarding switching her to oral Velcade due to increasing neuropathy Recent treatment has to be discontinued due to worsening neuropathy. We discussed the risks and benefits of discontinuation of treatment. She has received almost 22 months of adjuvant, posttransplant maintenance treatment I recommend discontinuation of Ninlaro in March 2017 Recent blood work showed that she continues to remain in remission I am also lengthening her Zometa treatment to every 6 months, due today. She has no evidence of osteonecrosis of the jaw. She will continue calcium with vitamin D supplement. She agreed with the plan of care. I will see her back in 3 months for further assessment

## 2015-07-17 NOTE — Progress Notes (Signed)
[place] OFFICE PROGRESS NOTE  Patient Care Team: Maurice Small, MD as PCP - General (Family Medicine) Laurence Spates, MD as Attending Physician (Gastroenterology) Jerrell Belfast, MD as Attending Physician (Otolaryngology) Collene Gobble, MD as Attending Physician (Pulmonary Disease) Garry Heater, DO as Consulting Physician (Hematology)  SUMMARY OF ONCOLOGIC HISTORY:   Multiple myeloma in remission ([place])   05/27/2012 Initial Diagnosis Multiple myeloma, without mention of having achieved remission(203.00)   05/27/2012 Cancer Staging 1.  IgG kappa multiple myeloma. She presented in 05/2012 with anemia, Cr 0.8, Ca 9.6; no bone lytic lesion; LDH 140; beta2- microglobulin 1.86 (ref 1.01-1.73). Positive SPEP for M-spike at 1.69, IgG 2080, kappa 7.24, kappa:lambda ratio 24.97; UPEP positive   06/08/2012 Bone Marrow Biopsy Bone marrow biopsy on 06/08/12 showed 19% plasma cell; normal cytogenetics; myeloma FISH pending.    06/14/2012 Imaging Skeletal survey negative for bone lytic lesions.  Lung nodule apparent.   09/17/2012 Bone Marrow Biopsy Bone marrow biopsy showed 11% plasma cells.   10/18/2012 - 12/31/2012 Chemotherapy Started on VRD (Velcade 1.3 mg/m^2 [place] days 1,8 and 15; Revlimide 25 mg PO daily, on day 1 through 14; Decadron 40 mg PO Days 1, 8, 15).   Started on coumadin given RD.   01/14/2013 Bone Marrow Biopsy The bone marrow clot sections are normocellular toslightly hypercellular for age (approximately 40% overall). 1% plasma cells.   02/08/2013 Bone Marrow Transplant Auto Transplant at [facility] (Dr. Glorianne Manchester McIver).   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    07/14/2012 Imaging 1. Right lower lobe nodule is non hypermetabolic which suggests benign etiology considering the slow growing rate in 6 years or non FDG avid disease as seen with carcinoid tumors. Pending biopsy. No PET evidence of mediastinal nodal disease.    08/02/2012 Imaging MRI of brain negative.   08/12/2012 Surgery Robotic Lobectomy of R Lower lobe.    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     Surgical resection margins are negative.     Two benign lymph nodes.     pTa1N0    INTERVAL HISTORY: Please see below for problem oriented charting. She returns for further follow-up. She feels well. Denies recent infection. No new peripheral neuropathy. Denies any cough or chest pain. She has close follow-up at [facility] related to prior lung resection. She complained of fatigue and generalized deconditioning. Neuropathy is improving without treatment She denies bone pain. She denies recent dental issue. She continues close follow-up with a dentist.   REVIEW OF SYSTEMS:   Constitutional: Denies fevers, chills or abnormal weight loss Eyes: Denies blurriness of vision Ears, nose, mouth, throat, and face: Denies mucositis or sore throat Respiratory: Denies cough, dyspnea or wheezes Cardiovascular: Denies palpitation, chest discomfort or lower extremity swelling Gastrointestinal:  Denies nausea, heartburn or change  in bowel habits Skin: Denies abnormal skin rashes Lymphatics: Denies new lymphadenopathy or easy bruising Neurological:Denies numbness, tingling or new  weaknesses Behavioral/Psych: Mood is stable, no new changes  All other systems were reviewed with the patient and are negative.  I have reviewed the past medical history, past surgical history, social history and family history with the patient and they are unchanged from previous note.  ALLERGIES:  has No Known Allergies.  MEDICATIONS:  Current Outpatient Prescriptions  Medication Sig Dispense Refill  . acyclovir (ZOVIRAX) 400 MG tablet TAKE 1 TABLET EVERY DAY 90 tablet 3  . amitriptyline (ELAVIL) 10 MG tablet Take 20 mg by mouth at bedtime.     Marland Kitchen buPROPion (WELLBUTRIN XL) 300 MG 24 hr tablet Take 300 mg by mouth daily.    . cholecalciferol (VITAMIN D) 1000 UNITS tablet Take 2,000 Units by mouth daily.    Marland Kitchen losartan (COZAAR) 50 MG tablet Take 50 mg by mouth daily.    . magnesium chloride (SLOW-MAG) 64 MG TBEC SR tablet Take 1 tablet by mouth 2 (two) times daily.     . metFORMIN (GLUCOPHAGE) 500 MG tablet Take 1 tablet (500 mg total) by mouth 2 (two) times daily with a meal. 60 tablet 0  . metoprolol succinate (TOPROL-XL) 50 MG 24 hr tablet Take 50 mg by mouth daily. Take with or immediately following a meal.    . Multiple Vitamins-Minerals (MULTIVITAMIN WITH MINERALS) tablet Take 1 tablet by mouth at bedtime.     . Multiple Vitamins-Minerals (PRESERVISION AREDS 2) CAPS Take 1 capsule by mouth 2 (two) times daily.    . ranitidine (ZANTAC) 150 MG capsule Take 150 mg by mouth daily.    . sertraline (ZOLOFT) 100 MG tablet Take 100 mg by mouth daily.     . simvastatin (ZOCOR) 40 MG tablet Take 40 mg by mouth at bedtime.      No current facility-administered medications for this visit.    PHYSICAL EXAMINATION: ECOG PERFORMANCE STATUS: 0 - Asymptomatic  Filed Vitals:   07/17/15 1341  BP: 148/63  Pulse: 83  Temp: 97.1 F (36.2 C)  Resp: 18   Filed Weights   07/17/15 1341  Weight: 205 lb 9.6 oz (93.26 kg)    GENERAL:alert, no distress and comfortable SKIN: skin color, texture,  turgor are normal, no rashes or significant lesions EYES: normal, Conjunctiva are pink and non-injected, sclera clear OROPHARYNX:no exudate, no erythema and lips, buccal mucosa, and tongue normal  NECK: supple, thyroid normal size, non-tender, without nodularity LYMPH:  no palpable lymphadenopathy in the cervical, axillary or inguinal LUNGS: clear to auscultation and percussion with normal breathing effort HEART: regular rate & rhythm and no murmurs and no lower extremity edema ABDOMEN:abdomen soft, non-tender and normal bowel sounds Musculoskeletal:no cyanosis of digits and no clubbing  NEURO: alert & oriented x 3 with fluent speech, no focal motor/sensory deficits  LABORATORY DATA:  I have reviewed the data as listed    Component Value Date/Time   NA 143 07/17/2015 1327   NA 141 03/15/2014 1741   K 4.2 07/17/2015 1327   K 4.5 03/15/2014 1741   CL 107 03/15/2014 1741   CL 105 06/08/2012 0913   CO2 27 07/17/2015 1327   CO2 29 10/10/2013 0726   GLUCOSE 137 07/17/2015 1327   GLUCOSE 106* 03/15/2014 1741   GLUCOSE 120* 06/08/2012 0913   BUN 15.8 07/17/2015 1327   BUN 24* 03/15/2014 1741   CREATININE 0.9 07/17/2015 1327   CREATININE 1.00 03/15/2014 1741  CALCIUM 9.9 07/17/2015 1327   CALCIUM 9.1 10/10/2013 0726   PROT 6.6 07/17/2015 1327   PROT 6.3 07/10/2015 1330   PROT 5.9* 10/08/2013 0927   ALBUMIN 3.6 07/17/2015 1327   ALBUMIN 2.2* 10/08/2013 0927   AST 34 07/17/2015 1327   AST 36 10/08/2013 0927   ALT 61* 07/17/2015 1327   ALT 46* 10/08/2013 0927   ALKPHOS 73 07/17/2015 1327   ALKPHOS 178* 10/08/2013 0927   BILITOT 0.47 07/17/2015 1327   BILITOT 0.4 10/08/2013 0927   GFRNONAA 87* 10/10/2013 0726   GFRAA >90 10/10/2013 0726    No results found for: SPEP, UPEP  Lab Results  Component Value Date   WBC 3.0* 07/17/2015   NEUTROABS 1.9 07/17/2015   HGB 10.8* 07/17/2015   HCT 33.1* 07/17/2015   MCV 100.9 07/17/2015   PLT 149 07/17/2015      Chemistry       Component Value Date/Time   NA 143 07/17/2015 1327   NA 141 03/15/2014 1741   K 4.2 07/17/2015 1327   K 4.5 03/15/2014 1741   CL 107 03/15/2014 1741   CL 105 06/08/2012 0913   CO2 27 07/17/2015 1327   CO2 29 10/10/2013 0726   BUN 15.8 07/17/2015 1327   BUN 24* 03/15/2014 1741   CREATININE 0.9 07/17/2015 1327   CREATININE 1.00 03/15/2014 1741      Component Value Date/Time   CALCIUM 9.9 07/17/2015 1327   CALCIUM 9.1 10/10/2013 0726   ALKPHOS 73 07/17/2015 1327   ALKPHOS 178* 10/08/2013 0927   AST 34 07/17/2015 1327   AST 36 10/08/2013 0927   ALT 61* 07/17/2015 1327   ALT 46* 10/08/2013 0927   BILITOT 0.47 07/17/2015 1327   BILITOT 0.4 10/08/2013 0927      ASSESSMENT & PLAN:  Multiple myeloma in remission I spoke with her transplant physician regarding switching her to oral Velcade due to increasing neuropathy Recent treatment has to be discontinued due to worsening neuropathy. We discussed the risks and benefits of discontinuation of treatment. She has received almost 22 months of adjuvant, posttransplant maintenance treatment I recommend discontinuation of Ninlaro in March 2017 Recent blood work showed that she continues to remain in remission I am also lengthening her Zometa treatment to every 6 months, due today. She has no evidence of osteonecrosis of the jaw. She will continue calcium with vitamin D supplement. She agreed with the plan of care. I will see her back in 3 months for further assessment  H/O autologous stem cell transplant She will return to [facility] her[facility] after autologous stem cell transplant. She has received all appropriate posttransplant vaccination.  Carcinoid tumor of left lung She is not symptomatic. Observe only. She will continue follow-up at [facility]  Acquired pancytopenia [facility]) This is due to recent treatment. She is not symptomatic. Observe only.  Neuropathy due to chemotherapeutic drug This is due to recent  treatment. Since discontinuation of treatment, it is improving. Observe only.  Quality of life palliative care encounter We discussed increase physical activity and possible enrollment with the LiveStrong program with the [facility]. She is aware of the [place] and local support group She has been to cancer survivorship clinic at [facility] and will continue to do so We discussed importance of vitamin D supplementation.   Orders Placed This Encounter  Procedures  . CBC with Differential/Platelet    Standing Status: Future     Number of Occurrences:  Standing Expiration Date: 08/20/2016  . Comprehensive metabolic panel    Standing Status: Future     Number of Occurrences:      Standing Expiration Date: 08/20/2016  . Kappa/lambda light chains    Standing Status: Future     Number of Occurrences:      Standing Expiration Date: 08/20/2016  . Multiple Myeloma Panel (SPEP&IFE w/QIG)    Standing Status: Future     Number of Occurrences:      Standing Expiration Date: 08/20/2016   All questions were answered. The patient knows to call the clinic with any problems, questions or concerns. No barriers to learning was detected. I spent 20 minutes counseling the patient face to face. The total time spent in the appointment was 25 minutes and more than 50% was on counseling and review of test results     [facility], [place], MD 07/17/2015 2:06 PM

## 2015-07-17 NOTE — Patient Instructions (Signed)

## 2015-07-17 NOTE — Assessment & Plan Note (Signed)
She will return to [facility] for herfollow-up after autologous stem cell transplant. She has received all appropriate posttransplant vaccination.

## 2015-07-17 NOTE — Assessment & Plan Note (Signed)
This is due to recent treatment She is not symptomatic Observe only 

## 2015-07-17 NOTE — Telephone Encounter (Signed)
cld pt and left a message of time /dateof appt on 9/19 & 9/26

## 2015-07-17 NOTE — Assessment & Plan Note (Signed)
We discussed increase physical activity and possible enrollment with the LiveStrong program with the [facility]. She is aware of the [place] and local support group She has been to cancer survivorship clinic at [facility] and will continue to do so We discussed importance of vitamin D supplementation.

## 2015-07-17 NOTE — Assessment & Plan Note (Signed)
She is not symptomatic. Observe only. She will continue follow-up at [facility] 

## 2015-10-04 ENCOUNTER — Other Ambulatory Visit: Payer: Self-pay

## 2015-10-04 ENCOUNTER — Telehealth: Payer: Self-pay | Admitting: *Deleted

## 2015-10-04 NOTE — Telephone Encounter (Signed)
Faxed signed medical clearance to [facility]

## 2015-10-09 ENCOUNTER — Telehealth: Payer: Self-pay | Admitting: Hematology and Oncology

## 2015-10-09 NOTE — Telephone Encounter (Signed)
10/16/2015 Appointment rescheduled to 10/15/2015 per patient request. Patient requested the appointment be close to 10 AM.

## 2015-10-15 ENCOUNTER — Other Ambulatory Visit: Payer: Medicare Other

## 2015-10-16 ENCOUNTER — Other Ambulatory Visit: Payer: Medicare Other

## 2015-10-16 ENCOUNTER — Telehealth: Payer: Self-pay | Admitting: *Deleted

## 2015-10-16 NOTE — Telephone Encounter (Signed)
Pt states she forgot about lab appt yesterday. Would like to reschedule lab for 9/25 and Dr Alvy Bimler to 10/2.

## 2015-10-16 NOTE — Telephone Encounter (Signed)
Pls send a scheduling appt

## 2015-10-18 ENCOUNTER — Telehealth: Payer: Self-pay | Admitting: Hematology and Oncology

## 2015-10-18 ENCOUNTER — Ambulatory Visit: Payer: Medicare Other

## 2015-10-18 NOTE — Telephone Encounter (Signed)
lvm to inform pt of r/s appts per LOS

## 2015-10-22 ENCOUNTER — Other Ambulatory Visit (HOSPITAL_BASED_OUTPATIENT_CLINIC_OR_DEPARTMENT_OTHER): Payer: Medicare Other

## 2015-10-22 DIAGNOSIS — C9001 Multiple myeloma in remission: Secondary | ICD-10-CM | POA: Diagnosis not present

## 2015-10-22 LAB — CBC WITH DIFFERENTIAL/PLATELET
BASO%: 0.4 % (ref 0.0–2.0)
BASOS ABS: 0 10*3/uL (ref 0.0–0.1)
EOS ABS: 0.2 10*3/uL (ref 0.0–0.5)
EOS%: 4.6 % (ref 0.0–7.0)
HCT: 33.9 % — ABNORMAL LOW (ref 34.8–46.6)
HGB: 11 g/dL — ABNORMAL LOW (ref 11.6–15.9)
LYMPH%: 17.1 % (ref 14.0–49.7)
MCH: 32.6 pg (ref 25.1–34.0)
MCHC: 32.5 g/dL (ref 31.5–36.0)
MCV: 100.4 fL (ref 79.5–101.0)
MONO#: 0.5 10*3/uL (ref 0.1–0.9)
MONO%: 10.9 % (ref 0.0–14.0)
NEUT#: 2.8 10*3/uL (ref 1.5–6.5)
NEUT%: 67 % (ref 38.4–76.8)
Platelets: 194 10*3/uL (ref 145–400)
RBC: 3.38 10*6/uL — AB (ref 3.70–5.45)
RDW: 14.5 % (ref 11.2–14.5)
WBC: 4.2 10*3/uL (ref 3.9–10.3)
lymph#: 0.7 10*3/uL — ABNORMAL LOW (ref 0.9–3.3)

## 2015-10-22 LAB — COMPREHENSIVE METABOLIC PANEL
ALBUMIN: 3.2 g/dL — AB (ref 3.5–5.0)
ALK PHOS: 130 U/L (ref 40–150)
ALT: 30 U/L (ref 0–55)
AST: 23 U/L (ref 5–34)
Anion Gap: 12 mEq/L — ABNORMAL HIGH (ref 3–11)
BUN: 16.7 mg/dL (ref 7.0–26.0)
CHLORIDE: 108 meq/L (ref 98–109)
CO2: 23 meq/L (ref 22–29)
Calcium: 9.9 mg/dL (ref 8.4–10.4)
Creatinine: 1 mg/dL (ref 0.6–1.1)
EGFR: 61 mL/min/{1.73_m2} — AB (ref >90)
GLUCOSE: 125 mg/dL (ref 70–140)
POTASSIUM: 4.4 meq/L (ref 3.5–5.1)
SODIUM: 143 meq/L (ref 136–145)
Total Bilirubin: 0.39 mg/dL (ref 0.20–1.20)
Total Protein: 7.2 g/dL (ref 6.4–8.3)

## 2015-10-23 ENCOUNTER — Ambulatory Visit: Payer: Medicare Other | Admitting: Hematology and Oncology

## 2015-10-23 LAB — KAPPA/LAMBDA LIGHT CHAINS
IG KAPPA FREE LIGHT CHAIN: 17.2 mg/L (ref 3.3–19.4)
IG LAMBDA FREE LIGHT CHAIN: 19.4 mg/L (ref 5.7–26.3)
KAPPA/LAMBDA FLC RATIO: 0.89 (ref 0.26–1.65)

## 2015-10-24 LAB — MULTIPLE MYELOMA PANEL, SERUM
ALBUMIN/GLOB SERPL: 1.2 (ref 0.7–1.7)
ALPHA 1: 0.3 g/dL (ref 0.0–0.4)
ALPHA2 GLOB SERPL ELPH-MCNC: 0.8 g/dL (ref 0.4–1.0)
Albumin SerPl Elph-Mcnc: 3.5 g/dL (ref 2.9–4.4)
B-GLOBULIN SERPL ELPH-MCNC: 1.1 g/dL (ref 0.7–1.3)
Gamma Glob SerPl Elph-Mcnc: 0.8 g/dL (ref 0.4–1.8)
Globulin, Total: 3 g/dL (ref 2.2–3.9)
IGG (IMMUNOGLOBIN G), SERUM: 839 mg/dL (ref 700–1600)
IGM (IMMUNOGLOBIN M), SRM: 25 mg/dL — AB (ref 26–217)
IgA, Qn, Serum: 118 mg/dL (ref 87–352)
TOTAL PROTEIN: 6.5 g/dL (ref 6.0–8.5)

## 2015-10-25 ENCOUNTER — Ambulatory Visit
Admission: RE | Admit: 2015-10-25 | Discharge: 2015-10-25 | Disposition: A | Discharge status: Home / Self Care | Payer: Medicare Other | Source: Ambulatory Visit | Attending: Family Medicine | Admitting: Family Medicine

## 2015-10-25 DIAGNOSIS — Z1231 Encounter for screening mammogram for malignant neoplasm of breast: Secondary | ICD-10-CM | POA: Diagnosis not present

## 2015-10-29 ENCOUNTER — Encounter: Payer: Self-pay | Admitting: Hematology and Oncology

## 2015-10-29 ENCOUNTER — Other Ambulatory Visit: Payer: Self-pay | Admitting: Hematology and Oncology

## 2015-10-29 ENCOUNTER — Ambulatory Visit (HOSPITAL_BASED_OUTPATIENT_CLINIC_OR_DEPARTMENT_OTHER): Payer: Medicare Other | Admitting: Hematology and Oncology

## 2015-10-29 VITALS — BP 128/52 | HR 88 | Temp 98.1°F | Resp 18 | Wt 199.9 lb

## 2015-10-29 DIAGNOSIS — C9001 Multiple myeloma in remission: Secondary | ICD-10-CM

## 2015-10-29 DIAGNOSIS — G620 Drug-induced polyneuropathy: Secondary | ICD-10-CM

## 2015-10-29 DIAGNOSIS — Z515 Encounter for palliative care: Secondary | ICD-10-CM

## 2015-10-29 DIAGNOSIS — D630 Anemia in neoplastic disease: Secondary | ICD-10-CM | POA: Diagnosis not present

## 2015-10-29 DIAGNOSIS — D3A090 Benign carcinoid tumor of the bronchus and lung: Secondary | ICD-10-CM | POA: Diagnosis not present

## 2015-10-29 DIAGNOSIS — Z23 Encounter for immunization: Secondary | ICD-10-CM | POA: Diagnosis not present

## 2015-10-29 DIAGNOSIS — T451X5A Adverse effect of antineoplastic and immunosuppressive drugs, initial encounter: Secondary | ICD-10-CM

## 2015-10-29 MED ORDER — INFLUENZA VAC SPLIT QUAD 0.5 ML IM SUSY
0.5000 mL | PREFILLED_SYRINGE | Freq: Once | INTRAMUSCULAR | Status: AC
  Administered 2015-10-29: 0.5 mL via INTRAMUSCULAR
  Filled 2015-10-29: qty 0.5

## 2015-10-29 NOTE — Assessment & Plan Note (Signed)
From our prior visit, she has element of deconditioning. We discussed the importance of physical exercise and activity. She is participating in regular activity with the [place] program and has lost some weight. I continue to encourage her to exercise as tolerated

## 2015-10-29 NOTE — Assessment & Plan Note (Signed)
She is not symptomatic. Observe only. She will continue follow-up at [facility] 

## 2015-10-29 NOTE — Assessment & Plan Note (Signed)
Since discontinuation of treatment, she has improvement of her pancytopenia. She is not symptomatic. Continue close observation

## 2015-10-29 NOTE — Assessment & Plan Note (Signed)
Recent treatment has to be discontinued due to worsening neuropathy. We discussed the risks and benefits of discontinuation of treatment. I recommend discontinuation of Ninlaro in March 2017 Recent blood work showed that she continues to remain in remission I am also lengthening her Zometa treatment to every 6 months, due in January 2018. She has no evidence of osteonecrosis of the jaw. She will continue calcium with vitamin D supplement. She agreed with the plan of care. I will see her back in 3 months for further assessment

## 2015-10-29 NOTE — Progress Notes (Signed)
[facility] OFFICE PROGRESS NOTE  Patient Care Team: Maurice Small, MD as PCP - General (Family Medicine) Laurence Spates, MD as Attending Physician (Gastroenterology) Jerrell Belfast, MD as Attending Physician (Otolaryngology) Collene Gobble, MD as Attending Physician (Pulmonary Disease) Garry Heater, DO as Consulting Physician (Hematology)  SUMMARY OF ONCOLOGIC HISTORY:   Multiple myeloma in remission ([place])   05/27/2012 Initial Diagnosis    Multiple myeloma, without mention of having achieved remission(203.00)      05/27/2012 Cancer Staging    1.  IgG kappa multiple myeloma. She presented in 05/2012 with anemia, Cr 0.8, Ca 9.6; no bone lytic lesion; LDH 140; beta2- microglobulin 1.86 (ref 1.01-1.73). Positive SPEP for M-spike at 1.69, IgG 2080, kappa 7.24, kappa:lambda ratio 24.97; UPEP positive      06/08/2012 Bone Marrow Biopsy    Bone marrow biopsy on 06/08/12 showed 19% plasma cell; normal cytogenetics; myeloma FISH pending.       06/14/2012 Imaging    Skeletal survey negative for bone lytic lesions.  Lung nodule apparent.      09/17/2012 Bone Marrow Biopsy    Bone marrow biopsy showed 11% plasma cells.      10/18/2012 - 12/31/2012 Chemotherapy    Started on VRD (Velcade 1.3 mg/m^2 [place] days 1,8 and 15; Revlimide 25 mg PO daily, on day 1 through 14; Decadron 40 mg PO Days 1, 8, 15).   Started on coumadin given RD.      01/14/2013 Bone Marrow Biopsy    The bone marrow clot sections are normocellular toslightly hypercellular for age (approximately 40% overall). 1% plasma cells.      02/08/2013 Bone Marrow Transplant    Auto Transplant at [facility] (Dr. Glorianne Manchester McIver).      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 Lamonte Sakai)      07/08/2012 Pathology Results    Pathology consistent with Non-small cell lung cancer of RLL.       07/14/2012 Imaging    1. Right lower lobe nodule is non hypermetabolic which suggests benign etiology considering the slow growing rate in 6 years or non FDG avid disease as seen with carcinoid tumors. Pending biopsy. No PET evidence of mediastinal nodal disease.       08/02/2012 Imaging    MRI of brain negative.      08/12/2012 Surgery    Robotic Lobectomy of R Lower lobe.       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     Surgical resection margins are negative.     Two benign lymph nodes.     pTa1N0       INTERVAL HISTORY: Please  see below for problem oriented charting. She returns for further follow-up. She feels well. She continues to have fatigue. Denies chest pain or shortness of breath. Peripheral neuropathy is improving. She denies recent infection. She is compliant taking calcium with vitamin D supplement  REVIEW OF SYSTEMS:   Constitutional: Denies fevers, chills or abnormal weight loss Eyes: Denies blurriness of vision Ears, nose, mouth, throat, and face: Denies mucositis or sore throat Respiratory: Denies cough, dyspnea or wheezes Cardiovascular: Denies palpitation, chest discomfort or lower extremity swelling Gastrointestinal:  Denies nausea, heartburn or change in bowel habits Skin: Denies abnormal skin rashes Lymphatics: Denies new lymphadenopathy or easy bruising Neurological:Denies numbness, tingling  or new weaknesses Behavioral/Psych: Mood is stable, no new changes  All other systems were reviewed with the patient and are negative.  I have reviewed the past medical history, past surgical history, social history and family history with the patient and they are unchanged from previous note.  ALLERGIES:  has No Known Allergies.  MEDICATIONS:  Current Outpatient Prescriptions  Medication Sig Dispense Refill  . acyclovir (ZOVIRAX) 400 MG tablet TAKE 1 TABLET EVERY DAY 90 tablet 3  . buPROPion (WELLBUTRIN XL) 300 MG 24 hr tablet Take 300 mg by mouth daily.    . cholecalciferol (VITAMIN D) 1000 UNITS tablet Take 2,000 Units by mouth daily.    Marland Kitchen losartan (COZAAR) 50 MG tablet Take 50 mg by mouth daily.    . magnesium chloride (SLOW-MAG) 64 MG TBEC SR tablet Take 1 tablet by mouth 2 (two) times daily.     . metFORMIN (GLUCOPHAGE) 500 MG tablet Take 1 tablet (500 mg total) by mouth 2 (two) times daily with a meal. 60 tablet 0  . metoprolol succinate (TOPROL-XL) 50 MG 24 hr tablet Take 50 mg by mouth daily. Take with or immediately following a meal.    . Multiple Vitamins-Minerals (MULTIVITAMIN WITH MINERALS) tablet Take 1 tablet by mouth at bedtime.     . Multiple Vitamins-Minerals (PRESERVISION AREDS 2) CAPS Take 1 capsule by mouth 2 (two) times daily.    . ranitidine (ZANTAC) 150 MG capsule Take 150 mg by mouth daily.    . sertraline (ZOLOFT) 100 MG tablet Take 100 mg by mouth daily.     . simvastatin (ZOCOR) 40 MG tablet Take 40 mg by mouth at bedtime.      No current facility-administered medications for this visit.     PHYSICAL EXAMINATION: ECOG PERFORMANCE STATUS: 0 - Asymptomatic  Vitals:   10/29/15 1416  BP: (!) 128/52  Pulse: 88  Resp: 18  Temp: 98.1 F (36.7 C)   Filed Weights   10/29/15 1416  Weight: 199 lb 14.4 oz (90.7 kg)    GENERAL:alert, no distress and comfortable SKIN: skin color, texture, turgor are normal, no rashes or significant lesions EYES: normal,  Conjunctiva are pink and non-injected, sclera clear OROPHARYNX:no exudate, no erythema and lips, buccal mucosa, and tongue normal  NECK: supple, thyroid normal size, non-tender, without nodularity LYMPH:  no palpable lymphadenopathy in the cervical, axillary or inguinal LUNGS: clear to auscultation and percussion with normal breathing effort HEART: regular rate & rhythm and no murmurs and no lower extremity edema ABDOMEN:abdomen soft, non-tender and normal bowel sounds Musculoskeletal:no cyanosis of digits and no clubbing  NEURO: alert & oriented x 3 with fluent speech, no focal motor/sensory deficits  LABORATORY DATA:  I have reviewed the data as listed    Component Value Date/Time   NA 143 10/22/2015 1410   K 4.4 10/22/2015  1410   CL 107 03/15/2014 1741   CL 105 06/08/2012 0913   CO2 23 10/22/2015 1410   GLUCOSE 125 10/22/2015 1410   GLUCOSE 120 (H) 06/08/2012 0913   BUN 16.7 10/22/2015 1410   CREATININE 1.0 10/22/2015 1410   CALCIUM 9.9 10/22/2015 1410   PROT 7.2 10/22/2015 1410   PROT 6.5 10/22/2015 1410   ALBUMIN 3.2 (L) 10/22/2015 1410   AST 23 10/22/2015 1410   ALT 30 10/22/2015 1410   ALKPHOS 130 10/22/2015 1410   BILITOT 0.39 10/22/2015 1410   GFRNONAA 87 (L) 10/10/2013 0726   GFRAA >90 10/10/2013 0726    No results found for: SPEP, UPEP  Lab Results  Component Value Date   WBC 4.2 10/22/2015   NEUTROABS 2.8 10/22/2015   HGB 11.0 (L) 10/22/2015   HCT 33.9 (L) 10/22/2015   MCV 100.4 10/22/2015   PLT 194 10/22/2015      Chemistry      Component Value Date/Time   NA 143 10/22/2015 1410   K 4.4 10/22/2015 1410   CL 107 03/15/2014 1741   CL 105 06/08/2012 0913   CO2 23 10/22/2015 1410   BUN 16.7 10/22/2015 1410   CREATININE 1.0 10/22/2015 1410      Component Value Date/Time   CALCIUM 9.9 10/22/2015 1410   ALKPHOS 130 10/22/2015 1410   AST 23 10/22/2015 1410   ALT 30 10/22/2015 1410   BILITOT 0.39 10/22/2015 1410      ASSESSMENT & PLAN:  Multiple  myeloma in remission Recent treatment has to be discontinued due to worsening neuropathy. We discussed the risks and benefits of discontinuation of treatment. I recommend discontinuation of Ninlaro in March 2017 Recent blood work showed that she continues to remain in remission I am also lengthening her Zometa treatment to every 6 months, due in January 2018. She has no evidence of osteonecrosis of the jaw. She will continue calcium with vitamin D supplement. She agreed with the plan of care. I will see her back in 3 months for further assessment  Neuropathy due to chemotherapeutic drug This is improving since discontinuation of treatment. Continue close observation  Carcinoid tumor of left lung She is not symptomatic. Observe only. She will continue follow-up at [facility]  Anemia in neoplastic disease Since discontinuation of treatment, she has improvement of her pancytopenia. She is not symptomatic. Continue close observation  Quality of life palliative care encounter From our prior visit, she has element of deconditioning. We discussed the importance of physical exercise and activity. She is participating in regular activity with the [place] program and has lost some weight. I continue to encourage her to exercise as tolerated   Orders Placed This Encounter  Procedures  . CBC with Differential/Platelet    Standing Status:   Future    Standing Expiration Date:   12/02/2016  . Comprehensive metabolic panel    Standing Status:   Future    Standing Expiration Date:   12/02/2016  . Kappa/lambda light chains    Standing Status:   Future    Standing Expiration Date:   12/02/2016  . Multiple Myeloma Panel (SPEP&IFE w/QIG)    Standing Status:   Future    Standing Expiration Date:   12/02/2016   All questions were answered. The patient knows to call the clinic with any problems, questions or concerns. No barriers to learning was detected. I spent 15 minutes counseling the patient  face to face. The total time spent in the appointment was 20  minutes and more than 50% was on counseling and review of test results     Heath Lark, MD 10/29/2015 2:34 PM

## 2015-10-29 NOTE — Assessment & Plan Note (Signed)
This is improving since discontinuation of treatment. Continue close observation

## 2015-11-02 ENCOUNTER — Telehealth: Payer: Self-pay

## 2015-11-02 NOTE — Telephone Encounter (Signed)
12/28 labs 01/31/16 see me at 130 pm, 30 mins and zometa afterwards  Appointments made and she has been called and is aware

## 2015-11-09 DIAGNOSIS — L720 Epidermal cyst: Secondary | ICD-10-CM | POA: Diagnosis not present

## 2015-11-09 DIAGNOSIS — L821 Other seborrheic keratosis: Secondary | ICD-10-CM | POA: Diagnosis not present

## 2015-11-09 DIAGNOSIS — D2362 Other benign neoplasm of skin of left upper limb, including shoulder: Secondary | ICD-10-CM | POA: Diagnosis not present

## 2015-11-09 DIAGNOSIS — D2262 Melanocytic nevi of left upper limb, including shoulder: Secondary | ICD-10-CM | POA: Diagnosis not present

## 2015-11-09 DIAGNOSIS — D2221 Melanocytic nevi of right ear and external auricular canal: Secondary | ICD-10-CM | POA: Diagnosis not present

## 2015-11-09 DIAGNOSIS — D224 Melanocytic nevi of scalp and neck: Secondary | ICD-10-CM | POA: Diagnosis not present

## 2015-11-09 DIAGNOSIS — D225 Melanocytic nevi of trunk: Secondary | ICD-10-CM | POA: Diagnosis not present

## 2015-11-09 DIAGNOSIS — D2261 Melanocytic nevi of right upper limb, including shoulder: Secondary | ICD-10-CM | POA: Diagnosis not present

## 2015-11-09 DIAGNOSIS — L853 Xerosis cutis: Secondary | ICD-10-CM | POA: Diagnosis not present

## 2015-11-09 DIAGNOSIS — L814 Other melanin hyperpigmentation: Secondary | ICD-10-CM | POA: Diagnosis not present

## 2015-11-09 DIAGNOSIS — I788 Other diseases of capillaries: Secondary | ICD-10-CM | POA: Diagnosis not present

## 2015-12-26 DIAGNOSIS — H353112 Nonexudative age-related macular degeneration, right eye, intermediate dry stage: Secondary | ICD-10-CM | POA: Diagnosis not present

## 2015-12-26 DIAGNOSIS — Z0100 Encounter for examination of eyes and vision without abnormal findings: Secondary | ICD-10-CM | POA: Diagnosis not present

## 2015-12-26 DIAGNOSIS — H353122 Nonexudative age-related macular degeneration, left eye, intermediate dry stage: Secondary | ICD-10-CM | POA: Diagnosis not present

## 2015-12-26 DIAGNOSIS — E119 Type 2 diabetes mellitus without complications: Secondary | ICD-10-CM | POA: Diagnosis not present

## 2016-01-04 DIAGNOSIS — Z902 Acquired absence of lung [part of]: Secondary | ICD-10-CM | POA: Diagnosis not present

## 2016-01-04 DIAGNOSIS — Z85118 Personal history of other malignant neoplasm of bronchus and lung: Secondary | ICD-10-CM | POA: Diagnosis not present

## 2016-01-04 DIAGNOSIS — R911 Solitary pulmonary nodule: Secondary | ICD-10-CM | POA: Diagnosis not present

## 2016-01-04 DIAGNOSIS — R918 Other nonspecific abnormal finding of lung field: Secondary | ICD-10-CM | POA: Diagnosis not present

## 2016-01-04 DIAGNOSIS — Z08 Encounter for follow-up examination after completed treatment for malignant neoplasm: Secondary | ICD-10-CM | POA: Diagnosis not present

## 2016-01-16 DIAGNOSIS — R1310 Dysphagia, unspecified: Secondary | ICD-10-CM | POA: Diagnosis not present

## 2016-01-22 ENCOUNTER — Telehealth: Payer: Self-pay | Admitting: *Deleted

## 2016-01-22 NOTE — Telephone Encounter (Signed)
Pt left VM states she will be out of town and unable to make her appts on 12/28 and 1/4.   She can make it on 1/9 and 1/12.   I called pt back and left her a VM to please return nurse's call.  I need to clarify what dates she is available to r/s.Marland Kitchen She will need labs one week prior to MD appt.Marland Kitchen

## 2016-01-23 ENCOUNTER — Telehealth: Payer: Self-pay | Admitting: *Deleted

## 2016-01-23 NOTE — Telephone Encounter (Signed)
Pt left VM states her plans have changed and she will keep her appts as scheduled.  Lab tomorrow and MD/ zometa on 1/4.

## 2016-01-24 ENCOUNTER — Other Ambulatory Visit (HOSPITAL_BASED_OUTPATIENT_CLINIC_OR_DEPARTMENT_OTHER): Payer: Medicare Other

## 2016-01-24 DIAGNOSIS — C9001 Multiple myeloma in remission: Secondary | ICD-10-CM

## 2016-01-24 LAB — COMPREHENSIVE METABOLIC PANEL
ALT: 51 U/L (ref 0–55)
AST: 33 U/L (ref 5–34)
Albumin: 3.7 g/dL (ref 3.5–5.0)
Alkaline Phosphatase: 95 U/L (ref 40–150)
Anion Gap: 13 mEq/L — ABNORMAL HIGH (ref 3–11)
BUN: 21.7 mg/dL (ref 7.0–26.0)
CO2: 23 meq/L (ref 22–29)
Calcium: 9.5 mg/dL (ref 8.4–10.4)
Chloride: 106 mEq/L (ref 98–109)
Creatinine: 1.2 mg/dL — ABNORMAL HIGH (ref 0.6–1.1)
EGFR: 46 mL/min/{1.73_m2} — AB (ref >90)
GLUCOSE: 238 mg/dL — AB (ref 70–140)
POTASSIUM: 4.6 meq/L (ref 3.5–5.1)
SODIUM: 141 meq/L (ref 136–145)
Total Bilirubin: 0.42 mg/dL (ref 0.20–1.20)
Total Protein: 6.9 g/dL (ref 6.4–8.3)

## 2016-01-24 LAB — CBC WITH DIFFERENTIAL/PLATELET
BASO%: 0.2 % (ref 0.0–2.0)
BASOS ABS: 0 10*3/uL (ref 0.0–0.1)
EOS ABS: 0.1 10*3/uL (ref 0.0–0.5)
EOS%: 1.3 % (ref 0.0–7.0)
HCT: 35.3 % (ref 34.8–46.6)
HGB: 11.6 g/dL (ref 11.6–15.9)
LYMPH%: 13.5 % — AB (ref 14.0–49.7)
MCH: 32.7 pg (ref 25.1–34.0)
MCHC: 32.9 g/dL (ref 31.5–36.0)
MCV: 99.5 fL (ref 79.5–101.0)
MONO#: 0.4 10*3/uL (ref 0.1–0.9)
MONO%: 6.4 % (ref 0.0–14.0)
NEUT#: 4.6 10*3/uL (ref 1.5–6.5)
NEUT%: 78.6 % — ABNORMAL HIGH (ref 38.4–76.8)
Platelets: 192 10*3/uL (ref 145–400)
RBC: 3.54 10*6/uL — AB (ref 3.70–5.45)
RDW: 15.8 % — ABNORMAL HIGH (ref 11.2–14.5)
WBC: 5.8 10*3/uL (ref 3.9–10.3)
lymph#: 0.8 10*3/uL — ABNORMAL LOW (ref 0.9–3.3)

## 2016-01-25 LAB — KAPPA/LAMBDA LIGHT CHAINS
IG KAPPA FREE LIGHT CHAIN: 13.8 mg/L (ref 3.3–19.4)
IG LAMBDA FREE LIGHT CHAIN: 14.4 mg/L (ref 5.7–26.3)
Kappa/Lambda FluidC Ratio: 0.96 (ref 0.26–1.65)

## 2016-01-29 LAB — MULTIPLE MYELOMA PANEL, SERUM
ALBUMIN SERPL ELPH-MCNC: 3.7 g/dL (ref 2.9–4.4)
ALPHA 1: 0.2 g/dL (ref 0.0–0.4)
ALPHA2 GLOB SERPL ELPH-MCNC: 0.7 g/dL (ref 0.4–1.0)
Albumin/Glob SerPl: 1.4 (ref 0.7–1.7)
B-Globulin SerPl Elph-Mcnc: 1.1 g/dL (ref 0.7–1.3)
GLOBULIN, TOTAL: 2.8 g/dL (ref 2.2–3.9)
Gamma Glob SerPl Elph-Mcnc: 0.8 g/dL (ref 0.4–1.8)
IGA/IMMUNOGLOBULIN A, SERUM: 117 mg/dL (ref 87–352)
IGG (IMMUNOGLOBIN G), SERUM: 871 mg/dL (ref 700–1600)
IGM (IMMUNOGLOBIN M), SRM: 22 mg/dL — AB (ref 26–217)
Total Protein: 6.5 g/dL (ref 6.0–8.5)

## 2016-01-31 ENCOUNTER — Ambulatory Visit (HOSPITAL_BASED_OUTPATIENT_CLINIC_OR_DEPARTMENT_OTHER): Payer: Medicare Other

## 2016-01-31 ENCOUNTER — Telehealth: Payer: Self-pay | Admitting: Hematology and Oncology

## 2016-01-31 ENCOUNTER — Ambulatory Visit (HOSPITAL_BASED_OUTPATIENT_CLINIC_OR_DEPARTMENT_OTHER): Payer: Medicare Other | Admitting: Hematology and Oncology

## 2016-01-31 VITALS — BP 145/54 | HR 78 | Temp 97.7°F | Resp 16 | Ht 65.0 in | Wt 203.6 lb

## 2016-01-31 DIAGNOSIS — C9001 Multiple myeloma in remission: Secondary | ICD-10-CM

## 2016-01-31 DIAGNOSIS — D3A090 Benign carcinoid tumor of the bronchus and lung: Secondary | ICD-10-CM | POA: Diagnosis not present

## 2016-01-31 DIAGNOSIS — Z9484 Stem cells transplant status: Secondary | ICD-10-CM

## 2016-01-31 DIAGNOSIS — N183 Chronic kidney disease, stage 3 unspecified: Secondary | ICD-10-CM

## 2016-01-31 MED ORDER — ZOLEDRONIC ACID 4 MG/100ML IV SOLN
4.0000 mg | Freq: Once | INTRAVENOUS | Status: AC
  Administered 2016-01-31: 4 mg via INTRAVENOUS
  Filled 2016-01-31: qty 100

## 2016-01-31 MED ORDER — SODIUM CHLORIDE 0.9 % IV SOLN
Freq: Once | INTRAVENOUS | Status: AC
  Administered 2016-01-31: 15:00:00 via INTRAVENOUS

## 2016-01-31 NOTE — Patient Instructions (Signed)

## 2016-01-31 NOTE — Telephone Encounter (Signed)
Appointments complete per 1/4 los. Patient aware and will get print out in infusion.

## 2016-02-01 ENCOUNTER — Encounter: Payer: Self-pay | Admitting: Hematology and Oncology

## 2016-02-01 NOTE — Assessment & Plan Note (Signed)
I reviewed recent CT report for [facility] She has no clear signs of recurrence She will continue follow-up there

## 2016-02-01 NOTE — Assessment & Plan Note (Signed)
She will return to [facility] for herfollow-up after autologous stem cell transplant. She has received all appropriate posttransplant vaccination.

## 2016-02-01 NOTE — Progress Notes (Signed)
[facility] OFFICE PROGRESS NOTE  Patient Care Team: Maurice Small, MD as PCP - General (Family Medicine) Laurence Spates, MD as Attending Physician (Gastroenterology) Jerrell Belfast, MD as Attending Physician (Otolaryngology) Collene Gobble, MD as Attending Physician (Pulmonary Disease) Garry Heater, DO as Consulting Physician (Hematology)  SUMMARY OF ONCOLOGIC HISTORY:   Multiple myeloma in remission ([place])   05/27/2012 Initial Diagnosis    Multiple myeloma, without mention of having achieved remission(203.00)      05/27/2012 Cancer Staging    1.  IgG kappa multiple myeloma. She presented in 05/2012 with anemia, Cr 0.8, Ca 9.6; no bone lytic lesion; LDH 140; beta2- microglobulin 1.86 (ref 1.01-1.73). Positive SPEP for M-spike at 1.69, IgG 2080, kappa 7.24, kappa:lambda ratio 24.97; UPEP positive      06/08/2012 Bone Marrow Biopsy    Bone marrow biopsy on 06/08/12 showed 19% plasma cell; normal cytogenetics; myeloma FISH pending.       06/14/2012 Imaging    Skeletal survey negative for bone lytic lesions.  Lung nodule apparent.      09/17/2012 Bone Marrow Biopsy    Bone marrow biopsy showed 11% plasma cells.      10/18/2012 - 12/31/2012 Chemotherapy    Started on VRD (Velcade 1.3 mg/m^2 [place] days 1,8 and 15; Revlimide 25 mg PO daily, on day 1 through 14; Decadron 40 mg PO Days 1, 8, 15).   Started on coumadin given RD.      01/14/2013 Bone Marrow Biopsy    The bone marrow clot sections are normocellular toslightly hypercellular for age (approximately 40% overall). 1% plasma cells.      02/08/2013 Bone Marrow Transplant    Auto Transplant at [facility] (Dr. Glorianne Manchester McIver).      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 Lamonte Sakai)      07/08/2012 Pathology Results    Pathology consistent with Non-small cell lung cancer of RLL.       07/14/2012 Imaging    1. Right lower lobe nodule is non hypermetabolic which suggests benign etiology considering the slow growing rate in 6 years or non FDG avid disease as seen with carcinoid tumors. Pending biopsy. No PET evidence of mediastinal nodal disease.       08/02/2012 Imaging    MRI of brain negative.      08/12/2012 Surgery    Robotic Lobectomy of R Lower lobe.       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     Surgical resection margins are negative.     Two benign lymph nodes.     pTa1N0       INTERVAL HISTORY: Please  see below for problem oriented charting. She feels well She had recent URI, treated with Augmentin Denies recent bone pain Neuropathy has almost resolved  REVIEW OF SYSTEMS:   Constitutional: Denies fevers, chills or abnormal weight loss Eyes: Denies blurriness of vision Ears, nose, mouth, throat, and face: Denies mucositis or sore throat Respiratory: Denies cough, dyspnea or wheezes Cardiovascular: Denies palpitation, chest discomfort or lower extremity swelling Gastrointestinal:  Denies nausea, heartburn or change in bowel habits Skin: Denies abnormal skin rashes Lymphatics: Denies new lymphadenopathy or easy bruising Neurological:Denies numbness, tingling or new weaknesses Behavioral/Psych: Mood is stable, no new changes  All other systems were reviewed with the patient and are  negative.  I have reviewed the past medical history, past surgical history, social history and family history with the patient and they are unchanged from previous note.  ALLERGIES:  has No Known Allergies.  MEDICATIONS:  Current Outpatient Prescriptions  Medication Sig Dispense Refill  . acyclovir (ZOVIRAX) 400 MG tablet TAKE 1 TABLET EVERY DAY 90 tablet 3  . buPROPion (WELLBUTRIN XL) 300 MG 24 hr tablet Take 300 mg by mouth daily.    . cholecalciferol (VITAMIN D) 1000 UNITS tablet Take 2,000 Units by mouth daily.    Marland Kitchen losartan (COZAAR) 50 MG tablet Take 50 mg by mouth daily.    . magnesium chloride (SLOW-MAG) 64 MG TBEC SR tablet Take 1 tablet by mouth 2 (two) times daily.     . metFORMIN (GLUCOPHAGE) 500 MG tablet Take 1 tablet (500 mg total) by mouth 2 (two) times daily with a meal. 60 tablet 0  . metoprolol succinate (TOPROL-XL) 50 MG 24 hr tablet Take 50 mg by mouth daily. Take with or immediately following a meal.    . Multiple Vitamins-Minerals (MULTIVITAMIN WITH MINERALS) tablet Take 1 tablet by mouth at bedtime.     . Multiple Vitamins-Minerals (PRESERVISION AREDS 2) CAPS Take 1 capsule by mouth 2 (two) times daily.    . ranitidine (ZANTAC) 150 MG capsule Take 150 mg by mouth daily.    . sertraline (ZOLOFT) 100 MG tablet Take 100 mg by mouth daily.     . simvastatin (ZOCOR) 40 MG tablet Take 40 mg by mouth at bedtime.      No current facility-administered medications for this visit.     PHYSICAL EXAMINATION: ECOG PERFORMANCE STATUS: 1 - Symptomatic but completely ambulatory  Vitals:   01/31/16 1401  BP: (!) 145/54  Pulse: 78  Resp: 16  Temp: 97.7 F (36.5 C)   Filed Weights   01/31/16 1401  Weight: 203 lb 9.6 oz (92.4 kg)    GENERAL:alert, no distress and comfortable SKIN: skin color, texture, turgor are normal, no rashes or significant lesions EYES: normal, Conjunctiva are pink and non-injected, sclera clear OROPHARYNX:no exudate, no erythema and lips, buccal  mucosa, and tongue normal  NECK: supple, thyroid normal size, non-tender, without nodularity LYMPH:  no palpable lymphadenopathy in the cervical, axillary or inguinal LUNGS: clear to auscultation and percussion with normal breathing effort HEART: regular rate & rhythm and no murmurs and no lower extremity edema ABDOMEN:abdomen soft, non-tender and normal bowel sounds Musculoskeletal:no cyanosis of digits and no clubbing  NEURO: alert & oriented x 3 with fluent speech, no focal motor/sensory deficits  LABORATORY DATA:  I have reviewed the data as listed    Component Value Date/Time   NA 141 01/24/2016 1341   K 4.6 01/24/2016 1341   CL 107 03/15/2014 1741   CL 105 06/08/2012 0913   CO2  23 01/24/2016 1341   GLUCOSE 238 (H) 01/24/2016 1341   GLUCOSE 120 (H) 06/08/2012 0913   BUN 21.7 01/24/2016 1341   CREATININE 1.2 (H) 01/24/2016 1341   CALCIUM 9.5 01/24/2016 1341   PROT 6.9 01/24/2016 1341   ALBUMIN 3.7 01/24/2016 1341   AST 33 01/24/2016 1341   ALT 51 01/24/2016 1341   ALKPHOS 95 01/24/2016 1341   BILITOT 0.42 01/24/2016 1341   GFRNONAA 87 (L) 10/10/2013 0726   GFRAA >90 10/10/2013 0726    No results found for: SPEP, UPEP  Lab Results  Component Value Date   WBC 5.8 01/24/2016   NEUTROABS 4.6 01/24/2016   HGB 11.6 01/24/2016   HCT 35.3 01/24/2016   MCV 99.5 01/24/2016   PLT 192 01/24/2016      Chemistry      Component Value Date/Time   NA 141 01/24/2016 1341   K 4.6 01/24/2016 1341   CL 107 03/15/2014 1741   CL 105 06/08/2012 0913   CO2 23 01/24/2016 1341   BUN 21.7 01/24/2016 1341   CREATININE 1.2 (H) 01/24/2016 1341      Component Value Date/Time   CALCIUM 9.5 01/24/2016 1341   ALKPHOS 95 01/24/2016 1341   AST 33 01/24/2016 1341   ALT 51 01/24/2016 1341   BILITOT 0.42 01/24/2016 1341     ASSESSMENT & PLAN:  Carcinoid tumor of left lung I reviewed recent CT report for [facility] She has no clear signs of recurrence She will continue follow-up  there  Multiple myeloma in remission Recent treatment has to be discontinued due to worsening neuropathy. We discussed the risks and benefits of discontinuation of treatment. I recommend discontinuation of Ninlaro in March 2017 Recent blood work showed that she continues to remain in remission I am also lengthening her Zometa treatment to every 6 months. She has no evidence of osteonecrosis of the jaw. She will continue calcium with vitamin D supplement. She agreed with the plan of care. I will see her back in 3 months for further assessment  H/O autologous stem cell transplant She will return to [facility]follow[facility] after autologous stem cell transplant. She has received all appropriate posttransplant vaccination.  Chronic kidney disease, stage III (moderate) she will continue current medical management. I recommend close follow-up with primary care doctor for medication adjustment. Continue risk factors modification   Orders Placed This Encounter  Procedures  . Comprehensive metabolic panel    Standing Status:   Future    Standing Expiration Date:   03/06/2017  . CBC with Differential/Platelet    Standing Status:   Future    Standing Expiration Date:   03/06/2017  . Kappa/lambda light chains    Standing Status:   Future    Standing Expiration Date:   03/06/2017  . Multiple Myeloma Panel (SPEP&IFE w/QIG)    Standing Status:   Future    Standing Expiration Date:   03/06/2017   All questions were answered. The patient knows to call the clinic with any problems, questions or concerns. No barriers to learning was detected. I spent 15 minutes counseling the patient face to face. The total time spent in the appointment was 20 minutes and more than 50% was on counseling and review of test results     Heath Lark, MD 02/01/2016 9:02 PM

## 2016-02-01 NOTE — Assessment & Plan Note (Signed)
Recent treatment has to be discontinued due to worsening neuropathy. We discussed the risks and benefits of discontinuation of treatment. I recommend discontinuation of Ninlaro in March 2017 Recent blood work showed that she continues to remain in remission I am also lengthening her Zometa treatment to every 6 months. She has no evidence of osteonecrosis of the jaw. She will continue calcium with vitamin D supplement. She agreed with the plan of care. I will see her back in 3 months for further assessment

## 2016-02-01 NOTE — Assessment & Plan Note (Signed)
she will continue current medical management. I recommend close follow-up with primary care doctor for medication adjustment. Continue risk factors modification 

## 2016-02-29 DIAGNOSIS — Z9484 Stem cells transplant status: Secondary | ICD-10-CM | POA: Diagnosis not present

## 2016-02-29 DIAGNOSIS — Z85118 Personal history of other malignant neoplasm of bronchus and lung: Secondary | ICD-10-CM | POA: Diagnosis not present

## 2016-02-29 DIAGNOSIS — Z08 Encounter for follow-up examination after completed treatment for malignant neoplasm: Secondary | ICD-10-CM | POA: Diagnosis not present

## 2016-02-29 DIAGNOSIS — C9001 Multiple myeloma in remission: Secondary | ICD-10-CM | POA: Diagnosis not present

## 2016-02-29 DIAGNOSIS — Z8579 Personal history of other malignant neoplasms of lymphoid, hematopoietic and related tissues: Secondary | ICD-10-CM | POA: Diagnosis not present

## 2016-02-29 DIAGNOSIS — C9000 Multiple myeloma not having achieved remission: Secondary | ICD-10-CM | POA: Diagnosis not present

## 2016-03-06 ENCOUNTER — Telehealth: Payer: Self-pay | Admitting: *Deleted

## 2016-03-06 NOTE — Telephone Encounter (Signed)
Pt returned call.  Her toes and bottom of feet feel are a little numb when walking. There is no pain. There has been no real change in this neuropathy since she stopped the med. She does not know for sure what to do. She states the bottom line is if the medication will extend her life she wants to do it.

## 2016-03-06 NOTE — Telephone Encounter (Signed)
I would like to meet with her face to face to make sure she understood the implications of treatment. She is forgetful sometimes. Please place scheduling appt early March with labs, 30 mins

## 2016-03-06 NOTE — Telephone Encounter (Signed)
lvm with pt per DR Alvy Bimler. inbasket sent.

## 2016-03-06 NOTE — Telephone Encounter (Signed)
Left message for pt to call office.  MD reviewed office note from pt's last visit at [facility]: Pt stopped chemo due to neuropathy. If she wants to resume chemo, MD can prescribe meds.

## 2016-03-07 ENCOUNTER — Telehealth: Payer: Self-pay | Admitting: Hematology and Oncology

## 2016-03-07 NOTE — Telephone Encounter (Signed)
lvm to inform pt of 3/6 lab/ov at 1230 pm per LOS

## 2016-03-24 ENCOUNTER — Telehealth: Payer: Self-pay | Admitting: *Deleted

## 2016-03-24 NOTE — Telephone Encounter (Signed)
Pt left a message stating she wants to cancel 3/6 appt. Will see Dr Alvy Bimler on 4/5

## 2016-04-01 ENCOUNTER — Other Ambulatory Visit: Payer: Medicare Other

## 2016-04-01 ENCOUNTER — Ambulatory Visit: Payer: Medicare Other | Admitting: Hematology and Oncology

## 2016-04-14 DIAGNOSIS — N390 Urinary tract infection, site not specified: Secondary | ICD-10-CM | POA: Diagnosis not present

## 2016-04-14 DIAGNOSIS — R300 Dysuria: Secondary | ICD-10-CM | POA: Diagnosis not present

## 2016-04-14 DIAGNOSIS — R350 Frequency of micturition: Secondary | ICD-10-CM | POA: Diagnosis not present

## 2016-04-18 ENCOUNTER — Telehealth: Payer: Self-pay | Admitting: Hematology and Oncology

## 2016-04-18 NOTE — Telephone Encounter (Signed)
Pt called to r/s appts to 4/13 and 4/20. Pt has new appt date/times

## 2016-04-24 ENCOUNTER — Other Ambulatory Visit: Payer: Medicare Other

## 2016-04-28 DIAGNOSIS — N390 Urinary tract infection, site not specified: Secondary | ICD-10-CM | POA: Diagnosis not present

## 2016-05-01 ENCOUNTER — Ambulatory Visit: Payer: Medicare Other

## 2016-05-01 ENCOUNTER — Ambulatory Visit: Payer: Medicare Other | Admitting: Hematology and Oncology

## 2016-05-02 ENCOUNTER — Telehealth: Payer: Self-pay | Admitting: Hematology and Oncology

## 2016-05-02 NOTE — Telephone Encounter (Signed)
Patient needs to move her next lab 4/13 to 4/16 if possible. And still keep her 4/20 appointment.  Patient will be out of town 4/11-4/14.   Please patient call with appointment

## 2016-05-09 ENCOUNTER — Other Ambulatory Visit: Payer: Medicare Other

## 2016-05-12 ENCOUNTER — Telehealth: Payer: Self-pay | Admitting: Hematology and Oncology

## 2016-05-12 ENCOUNTER — Other Ambulatory Visit (HOSPITAL_BASED_OUTPATIENT_CLINIC_OR_DEPARTMENT_OTHER): Payer: Medicare Other

## 2016-05-12 DIAGNOSIS — C9001 Multiple myeloma in remission: Secondary | ICD-10-CM | POA: Diagnosis not present

## 2016-05-12 LAB — CBC WITH DIFFERENTIAL/PLATELET
BASO%: 0.5 % (ref 0.0–2.0)
BASOS ABS: 0 10*3/uL (ref 0.0–0.1)
EOS ABS: 0.1 10*3/uL (ref 0.0–0.5)
EOS%: 2.6 % (ref 0.0–7.0)
HCT: 34.8 % (ref 34.8–46.6)
HGB: 11.7 g/dL (ref 11.6–15.9)
LYMPH%: 24.6 % (ref 14.0–49.7)
MCH: 33.4 pg (ref 25.1–34.0)
MCHC: 33.5 g/dL (ref 31.5–36.0)
MCV: 99.6 fL (ref 79.5–101.0)
MONO#: 0.2 10*3/uL (ref 0.1–0.9)
MONO%: 5.5 % (ref 0.0–14.0)
NEUT#: 2.4 10*3/uL (ref 1.5–6.5)
NEUT%: 66.8 % (ref 38.4–76.8)
Platelets: 180 10*3/uL (ref 145–400)
RBC: 3.5 10*6/uL — ABNORMAL LOW (ref 3.70–5.45)
RDW: 14.5 % (ref 11.2–14.5)
WBC: 3.6 10*3/uL — ABNORMAL LOW (ref 3.9–10.3)
lymph#: 0.9 10*3/uL (ref 0.9–3.3)

## 2016-05-12 LAB — COMPREHENSIVE METABOLIC PANEL
ALBUMIN: 3.7 g/dL (ref 3.5–5.0)
ALT: 43 U/L (ref 0–55)
AST: 28 U/L (ref 5–34)
Alkaline Phosphatase: 71 U/L (ref 40–150)
Anion Gap: 10 mEq/L (ref 3–11)
BUN: 18.4 mg/dL (ref 7.0–26.0)
CHLORIDE: 106 meq/L (ref 98–109)
CO2: 25 meq/L (ref 22–29)
CREATININE: 0.8 mg/dL (ref 0.6–1.1)
Calcium: 9.8 mg/dL (ref 8.4–10.4)
EGFR: 73 mL/min/{1.73_m2} — AB (ref >90)
Glucose: 187 mg/dl — ABNORMAL HIGH (ref 70–140)
POTASSIUM: 4.2 meq/L (ref 3.5–5.1)
SODIUM: 141 meq/L (ref 136–145)
Total Bilirubin: 0.53 mg/dL (ref 0.20–1.20)
Total Protein: 6.7 g/dL (ref 6.4–8.3)

## 2016-05-12 NOTE — Telephone Encounter (Signed)
Called patient to inform her of next scheduled appointments. Patient not available/ no VM to leave a message.

## 2016-05-13 LAB — KAPPA/LAMBDA LIGHT CHAINS
IG KAPPA FREE LIGHT CHAIN: 12.6 mg/L (ref 3.3–19.4)
Ig Lambda Free Light Chain: 13.3 mg/L (ref 5.7–26.3)
Kappa/Lambda FluidC Ratio: 0.95 (ref 0.26–1.65)

## 2016-05-15 DIAGNOSIS — H6123 Impacted cerumen, bilateral: Secondary | ICD-10-CM | POA: Diagnosis not present

## 2016-05-15 DIAGNOSIS — R42 Dizziness and giddiness: Secondary | ICD-10-CM | POA: Diagnosis not present

## 2016-05-15 DIAGNOSIS — H9313 Tinnitus, bilateral: Secondary | ICD-10-CM | POA: Diagnosis not present

## 2016-05-15 DIAGNOSIS — R112 Nausea with vomiting, unspecified: Secondary | ICD-10-CM | POA: Diagnosis not present

## 2016-05-15 LAB — MULTIPLE MYELOMA PANEL, SERUM
ALBUMIN SERPL ELPH-MCNC: 3.6 g/dL (ref 2.9–4.4)
Albumin/Glob SerPl: 1.4 (ref 0.7–1.7)
Alpha 1: 0.2 g/dL (ref 0.0–0.4)
Alpha2 Glob SerPl Elph-Mcnc: 0.6 g/dL (ref 0.4–1.0)
B-Globulin SerPl Elph-Mcnc: 1 g/dL (ref 0.7–1.3)
GAMMA GLOB SERPL ELPH-MCNC: 0.8 g/dL (ref 0.4–1.8)
GLOBULIN, TOTAL: 2.7 g/dL (ref 2.2–3.9)
IGA/IMMUNOGLOBULIN A, SERUM: 117 mg/dL (ref 87–352)
IgG, Qn, Serum: 826 mg/dL (ref 700–1600)
IgM, Qn, Serum: 28 mg/dL (ref 26–217)
TOTAL PROTEIN: 6.3 g/dL (ref 6.0–8.5)

## 2016-05-16 ENCOUNTER — Ambulatory Visit: Payer: Medicare Other

## 2016-05-16 ENCOUNTER — Telehealth: Payer: Self-pay | Admitting: Hematology and Oncology

## 2016-05-16 ENCOUNTER — Ambulatory Visit: Payer: Medicare Other | Admitting: Hematology and Oncology

## 2016-05-16 NOTE — Telephone Encounter (Signed)
Cancelled appt per patient not feeling well. Per Dr. Alvy Bimler okay to cancel treatment

## 2016-05-26 ENCOUNTER — Telehealth: Payer: Self-pay

## 2016-05-26 NOTE — Telephone Encounter (Signed)
Patient called and left message that she wants to reschedule missed appt due to vertigo.

## 2016-05-27 ENCOUNTER — Other Ambulatory Visit: Payer: Self-pay | Admitting: Hematology and Oncology

## 2016-05-27 NOTE — Telephone Encounter (Signed)
I will place scheduling msg

## 2016-05-28 DIAGNOSIS — H9123 Sudden idiopathic hearing loss, bilateral: Secondary | ICD-10-CM | POA: Diagnosis not present

## 2016-05-28 DIAGNOSIS — H90A21 Sensorineural hearing loss, unilateral, right ear, with restricted hearing on the contralateral side: Secondary | ICD-10-CM | POA: Diagnosis not present

## 2016-05-28 DIAGNOSIS — H90A32 Mixed conductive and sensorineural hearing loss, unilateral, left ear with restricted hearing on the contralateral side: Secondary | ICD-10-CM | POA: Diagnosis not present

## 2016-05-30 ENCOUNTER — Telehealth: Payer: Self-pay | Admitting: Hematology and Oncology

## 2016-05-30 NOTE — Telephone Encounter (Signed)
Called patient to inform her of 5.17.18 appointments. Patient stated not to call her number anymore. Sent calendars in mail.

## 2016-06-04 DIAGNOSIS — N390 Urinary tract infection, site not specified: Secondary | ICD-10-CM | POA: Diagnosis not present

## 2016-06-11 DIAGNOSIS — H9123 Sudden idiopathic hearing loss, bilateral: Secondary | ICD-10-CM | POA: Diagnosis not present

## 2016-06-12 ENCOUNTER — Ambulatory Visit (HOSPITAL_BASED_OUTPATIENT_CLINIC_OR_DEPARTMENT_OTHER): Payer: Medicare Other

## 2016-06-12 ENCOUNTER — Encounter: Payer: Self-pay | Admitting: Hematology and Oncology

## 2016-06-12 ENCOUNTER — Telehealth: Payer: Self-pay | Admitting: Hematology and Oncology

## 2016-06-12 ENCOUNTER — Ambulatory Visit (HOSPITAL_BASED_OUTPATIENT_CLINIC_OR_DEPARTMENT_OTHER): Payer: Medicare Other | Admitting: Hematology and Oncology

## 2016-06-12 ENCOUNTER — Telehealth: Payer: Self-pay

## 2016-06-12 VITALS — BP 122/65 | HR 70 | Temp 98.0°F | Resp 18 | Ht 65.0 in | Wt 206.1 lb

## 2016-06-12 DIAGNOSIS — C9001 Multiple myeloma in remission: Secondary | ICD-10-CM | POA: Diagnosis not present

## 2016-06-12 DIAGNOSIS — R300 Dysuria: Secondary | ICD-10-CM | POA: Diagnosis not present

## 2016-06-12 DIAGNOSIS — E119 Type 2 diabetes mellitus without complications: Secondary | ICD-10-CM | POA: Diagnosis not present

## 2016-06-12 DIAGNOSIS — Z9484 Stem cells transplant status: Secondary | ICD-10-CM

## 2016-06-12 LAB — URINALYSIS, MICROSCOPIC - CHCC
BILIRUBIN (URINE): NEGATIVE
BLOOD: NEGATIVE
GLUCOSE UR CHCC: NEGATIVE mg/dL
Ketones: NEGATIVE mg/dL
NITRITE: NEGATIVE
PH: 5 (ref 4.6–8.0)
Protein: <30 mg/dL
RBC / HPF: NEGATIVE (ref 0–2)
Specific Gravity, Urine: 1.03 (ref 1.003–1.035)
UROBILINOGEN UR: 0.2 mg/dL (ref 0.2–1)

## 2016-06-12 MED ORDER — ZOLEDRONIC ACID 4 MG/100ML IV SOLN
4.0000 mg | Freq: Once | INTRAVENOUS | Status: AC
  Administered 2016-06-12: 4 mg via INTRAVENOUS
  Filled 2016-06-12: qty 100

## 2016-06-12 MED ORDER — SODIUM CHLORIDE 0.9 % IV SOLN
Freq: Once | INTRAVENOUS | Status: AC
  Administered 2016-06-12: 13:00:00 via INTRAVENOUS

## 2016-06-12 NOTE — Progress Notes (Signed)
[place] OFFICE PROGRESS NOTE  Patient Care Team: Maurice Small, MD as PCP - General (Family Medicine) Laurence Spates, MD as Attending Physician (Gastroenterology) Jerrell Belfast, MD as Attending Physician (Otolaryngology) Collene Gobble, MD as Attending Physician (Pulmonary Disease) [place], Glorianne Manchester, DO as Consulting Physician (Hematology)  SUMMARY OF ONCOLOGIC HISTORY:   Multiple myeloma in remission ([place])   05/27/2012 Initial Diagnosis    Multiple myeloma, without mention of having achieved remission(203.00)      05/27/2012 Cancer Staging    1.  IgG kappa multiple myeloma. She presented in 05/2012 with anemia, Cr 0.8, Ca 9.6; no bone lytic lesion; LDH 140; beta2- microglobulin 1.86 (ref 1.01-1.73). Positive SPEP for M-spike at 1.69, IgG 2080, kappa 7.24, kappa:lambda ratio 24.97; UPEP positive      06/08/2012 Bone Marrow Biopsy    Bone marrow biopsy on 06/08/12 showed 19% plasma cell; normal cytogenetics; myeloma FISH pending.       06/14/2012 Imaging    Skeletal survey negative for bone lytic lesions.  Lung nodule apparent.      09/17/2012 Bone Marrow Biopsy    Bone marrow biopsy showed 11% plasma cells.      10/18/2012 - 12/31/2012 Chemotherapy    Started on VRD (Velcade 1.3 mg/m^2 [place] days 1,8 and 15; Revlimide 25 mg PO daily, on day 1 through 14; Decadron 40 mg PO Days 1, 8, 15).   Started on coumadin given RD.      01/14/2013 Bone Marrow Biopsy    The bone marrow clot sections are normocellular toslightly hypercellular for age (approximately 40% overall). 1% plasma cells.      02/08/2013 Bone Marrow Transplant    Auto Transplant at [facility] (Dr. Glorianne Manchester McIver).      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 Lamonte Sakai)      07/08/2012 Pathology Results    Pathology consistent with Non-small cell lung cancer of RLL.       07/14/2012 Imaging    1. Right lower lobe nodule is non hypermetabolic which suggests benign etiology considering the slow growing rate in 6 years or non FDG avid disease as seen with carcinoid tumors. Pending biopsy. No PET evidence of mediastinal nodal disease.       08/02/2012 Imaging    MRI of brain negative.      08/12/2012 Surgery    Robotic Lobectomy of R Lower lobe.       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     Surgical resection margins are negative.     Two benign lymph nodes.     pTa1N0       INTERVAL HISTORY: Please  see below for problem oriented charting. She returns for further follow-up She has symptoms of cystitis despite being prescribed antibiotics She has 2 more pills left She had 3 episodes of UTI in the last 3 months Denies recent cough, chest pain or shortness of breath She denies recent signs or symptoms of osteonecrosis of the jaw No new areas of bone pain  REVIEW OF SYSTEMS:   Constitutional: Denies fevers, chills or abnormal weight loss Eyes: Denies blurriness of vision Ears, nose, mouth, throat, and face: Denies mucositis or sore throat Respiratory: Denies cough, dyspnea or wheezes Cardiovascular: Denies palpitation, chest discomfort or lower extremity swelling Gastrointestinal:  Denies nausea, heartburn or change in bowel habits Skin: Denies abnormal skin rashes Lymphatics:  Denies new lymphadenopathy or easy bruising Neurological:Denies numbness, tingling or new weaknesses Behavioral/Psych: Mood is stable, no new changes  All other systems were reviewed with the patient and are negative.  I have reviewed the past medical history, past surgical history, social history and family history with the patient and they are unchanged from previous note.  ALLERGIES:  has No Known Allergies.  MEDICATIONS:  Current Outpatient Prescriptions  Medication Sig Dispense Refill  . acyclovir (ZOVIRAX) 400 MG tablet TAKE 1 TABLET EVERY DAY 90 tablet 3  . buPROPion (WELLBUTRIN XL) 300 MG 24 hr tablet Take 300 mg by mouth daily.    . cholecalciferol (VITAMIN D) 1000 UNITS tablet Take 2,000 Units by mouth daily.    . ciprofloxacin (CIPRO) 500 MG tablet Take 500 mg by mouth 2 (two) times daily. for 10 days  0  . clotrimazole (LOTRIMIN) 1 % cream Apply 1 application topically 2 (two) times daily.  0  . losartan (COZAAR) 50 MG tablet Take 50 mg by mouth daily.    . magnesium chloride (SLOW-MAG) 64 MG TBEC SR tablet Take 1 tablet by mouth 2 (two) times daily.     . metFORMIN (GLUCOPHAGE) 500 MG tablet Take 1 tablet (500 mg total) by mouth 2 (two) times daily with a meal. 60 tablet 0  . metoprolol succinate (TOPROL-XL) 50 MG 24 hr tablet Take 50 mg by mouth daily. Take with or immediately following a meal.    . Multiple Vitamins-Minerals (MULTIVITAMIN WITH MINERALS) tablet Take 1 tablet by mouth at bedtime.     . Multiple Vitamins-Minerals (PRESERVISION AREDS 2) CAPS Take 1 capsule by mouth 2 (two) times daily.    . ranitidine (ZANTAC) 150 MG capsule Take 150 mg by mouth daily.    . sertraline (ZOLOFT) 100 MG tablet Take 100 mg by mouth daily.     . simvastatin (ZOCOR) 40 MG tablet Take 40 mg by mouth at bedtime.     . predniSONE (DELTASONE) 20 MG tablet Take 20 mg by mouth daily. Take 3 tablets by mouth daily x 3 days, then 2 tables daily x 2 days, then 1 tablet daily x 2 days.     No  current facility-administered medications for this visit.     PHYSICAL EXAMINATION: ECOG PERFORMANCE STATUS: 1 - Symptomatic but completely ambulatory  Vitals:   06/12/16 1133  BP: 122/65  Pulse: 70  Resp: 18  Temp: 98 F (36.7 C)   Filed Weights   06/12/16 1133  Weight: 206 lb 1.6 oz (93.5 kg)    GENERAL:alert, no distress and comfortable SKIN: skin color, texture, turgor are normal, no rashes or significant lesions EYES: normal, Conjunctiva are pink and non-injected, sclera clear OROPHARYNX:no exudate, no erythema and lips, buccal mucosa, and tongue normal  NECK: supple, thyroid normal size, non-tender, without nodularity LYMPH:  no palpable lymphadenopathy in the cervical, axillary or inguinal LUNGS: clear to auscultation and percussion with normal breathing effort HEART: regular rate & rhythm and no murmurs and no lower extremity edema ABDOMEN:abdomen soft, non-tender and normal bowel sounds Musculoskeletal:no cyanosis of digits and no clubbing  NEURO: alert & oriented x 3 with fluent speech, no focal motor/sensory deficits  LABORATORY DATA:  I have reviewed the data as listed    Component Value Date/Time   NA 141 05/12/2016 1417   K 4.2 05/12/2016 1417   CL 107 03/15/2014 1741   CL 105 06/08/2012 0913   CO2 25 05/12/2016 1417   GLUCOSE 187 (H) 05/12/2016 1417   GLUCOSE 120 (H) 06/08/2012 0913   BUN 18.4 05/12/2016 1417   CREATININE 0.8 05/12/2016 1417   CALCIUM 9.8 05/12/2016 1417   PROT 6.7 05/12/2016 1417   PROT 6.3 05/12/2016 1417   ALBUMIN 3.7 05/12/2016 1417   AST 28 05/12/2016 1417   ALT 43 05/12/2016 1417   ALKPHOS 71 05/12/2016 1417   BILITOT 0.53 05/12/2016 1417   GFRNONAA 87 (L) 10/10/2013 0726   GFRAA >90 10/10/2013 0726    No results found for: SPEP, UPEP  Lab Results  Component Value Date   WBC 3.6 (L) 05/12/2016   NEUTROABS 2.4 05/12/2016   HGB 11.7 05/12/2016   HCT 34.8 05/12/2016   MCV 99.6 05/12/2016   PLT 180 05/12/2016       Chemistry      Component Value Date/Time   NA 141 05/12/2016 1417   K 4.2 05/12/2016 1417   CL 107 03/15/2014 1741   CL 105 06/08/2012 0913   CO2 25 05/12/2016 1417   BUN 18.4 05/12/2016 1417   CREATININE 0.8 05/12/2016 1417      Component Value Date/Time   CALCIUM 9.8 05/12/2016 1417   ALKPHOS 71 05/12/2016 1417   AST 28 05/12/2016 1417   ALT 43 05/12/2016 1417   BILITOT 0.53 05/12/2016 1417       ASSESSMENT & PLAN:  Multiple myeloma in remission Her myeloma treatment has to be discontinued due to worsening neuropathy. We discussed the risks and benefits of discontinuation of treatment. She stopped Ninlaro in March 2017 Recent blood work showed that she continues to remain in remission I am also lengthening her Zometa treatment to every 6 months. She has no evidence of osteonecrosis of the jaw. She will continue calcium with vitamin D supplement. She agreed with the plan of care. I will see her back in 3 months for further assessment I recommend bone density scan before she returns next time  Dysuria She has recurrent UTI, 3 episodes in 2 months She has persistent cystitis symptoms despite being prescribed ciprofloxacin I recommend repeat urinalysis and urine culture today I will call the patient with test results I recommend referral to urologist for further evaluation and management  H/O autologous stem cell transplant She will return to [facility] for herfollow-up after autologous stem cell transplant. She has received all appropriate posttransplant vaccination.  Type 2 diabetes mellitus without complications She has mild, persistent hypoglycemia due to recent prednisone therapy. She will continue medical management   Orders Placed This Encounter  Procedures  . Urine culture    Standing Status:   Future    Number of Occurrences:   1    Standing Expiration Date:   07/17/2017  . Urinalysis, Microscopic - [facility]    Standing Status:   Future  Number of  Occurrences:   1    Standing Expiration Date:   07/17/2017  . Kappa/lambda light chains    Standing Status:   Future    Standing Expiration Date:   07/17/2017  . Multiple Myeloma Panel (SPEP&IFE w/QIG)    Standing Status:   Future    Standing Expiration Date:   07/17/2017  . Ambulatory referral to Urology    Referral Priority:   Routine    Referral Type:   Consultation    Referral Reason:   Specialty Services Required    Requested Specialty:   Urology    Number of Visits Requested:   1   All questions were answered. The patient knows to call the clinic with any problems, questions or concerns. No barriers to learning was detected. I spent 20 minutes counseling the patient face to face. The total time spent in the appointment was 25 minutes and more than 50% was on counseling and review of test results     Heath Lark, MD 06/12/2016 12:54 PM

## 2016-06-12 NOTE — Telephone Encounter (Signed)
Gave patient AVS and calender per 5/17 los.

## 2016-06-12 NOTE — Assessment & Plan Note (Signed)
She has recurrent UTI, 3 episodes in 2 months She has persistent cystitis symptoms despite being prescribed ciprofloxacin I recommend repeat urinalysis and urine culture today I will call the patient with test results I recommend referral to urologist for further evaluation and management

## 2016-06-12 NOTE — Assessment & Plan Note (Signed)
She will return to [facility] for herfollow[facility]up after autologous stem cell transplant. She has received all appropriate posttransplant vaccination.

## 2016-06-12 NOTE — Assessment & Plan Note (Signed)
She has mild, persistent hypoglycemia due to recent prednisone therapy. She will continue medical management

## 2016-06-12 NOTE — Telephone Encounter (Signed)
Called and left below message. Instructed to call for any problems.

## 2016-06-12 NOTE — Assessment & Plan Note (Signed)
Her myeloma treatment has to be discontinued due to worsening neuropathy. We discussed the risks and benefits of discontinuation of treatment. She stopped Ninlaro in March 2017 Recent blood work showed that she continues to remain in remission I am also lengthening her Zometa treatment to every 6 months. She has no evidence of osteonecrosis of the jaw. She will continue calcium with vitamin D supplement. She agreed with the plan of care. I will see her back in 3 months for further assessment I recommend bone density scan before she returns next time

## 2016-06-12 NOTE — Telephone Encounter (Signed)
Referral called to [facility] for recurrent UTI. Appointment obtained for patient for 07-28-16 at 2:30 with Dr. Jeffie Pollock. New patient package to be sent to patient from [facility].

## 2016-06-14 LAB — URINE CULTURE

## 2016-06-16 ENCOUNTER — Telehealth: Payer: Self-pay

## 2016-06-16 ENCOUNTER — Other Ambulatory Visit: Payer: Self-pay | Admitting: Otolaryngology

## 2016-06-16 DIAGNOSIS — H9042 Sensorineural hearing loss, unilateral, left ear, with unrestricted hearing on the contralateral side: Secondary | ICD-10-CM

## 2016-06-16 DIAGNOSIS — IMO0001 Reserved for inherently not codable concepts without codable children: Secondary | ICD-10-CM

## 2016-06-16 NOTE — Telephone Encounter (Signed)
-----   Message from Heath Lark, MD sent at 06/16/2016  6:36 AM EDT ----- Urine culture neg ----- Message ----- From: Interface, Lab In Three Zero One Sent: 06/12/2016   1:39 PM To: Heath Lark, MD

## 2016-06-16 NOTE — Telephone Encounter (Signed)
Called and left below message. 

## 2016-06-17 ENCOUNTER — Ambulatory Visit
Admission: RE | Admit: 2016-06-17 | Discharge: 2016-06-17 | Disposition: A | Discharge status: Home / Self Care | Payer: Medicare Other | Source: Ambulatory Visit | Attending: Otolaryngology | Admitting: Otolaryngology

## 2016-06-17 DIAGNOSIS — H905 Unspecified sensorineural hearing loss: Secondary | ICD-10-CM | POA: Diagnosis not present

## 2016-06-17 DIAGNOSIS — IMO0001 Reserved for inherently not codable concepts without codable children: Secondary | ICD-10-CM

## 2016-06-17 DIAGNOSIS — H9042 Sensorineural hearing loss, unilateral, left ear, with unrestricted hearing on the contralateral side: Secondary | ICD-10-CM

## 2016-06-17 MED ORDER — GADOBENATE DIMEGLUMINE 529 MG/ML IV SOLN
20.0000 mL | Freq: Once | INTRAVENOUS | Status: AC | PRN
  Administered 2016-06-17: 20 mL via INTRAVENOUS

## 2016-06-18 DIAGNOSIS — H90A32 Mixed conductive and sensorineural hearing loss, unilateral, left ear with restricted hearing on the contralateral side: Secondary | ICD-10-CM | POA: Diagnosis not present

## 2016-06-18 DIAGNOSIS — H9123 Sudden idiopathic hearing loss, bilateral: Secondary | ICD-10-CM | POA: Diagnosis not present

## 2016-06-18 DIAGNOSIS — H90A21 Sensorineural hearing loss, unilateral, right ear, with restricted hearing on the contralateral side: Secondary | ICD-10-CM | POA: Diagnosis not present

## 2016-06-24 ENCOUNTER — Other Ambulatory Visit: Payer: Self-pay | Admitting: *Deleted

## 2016-06-24 MED ORDER — ACYCLOVIR 400 MG PO TABS
400.0000 mg | ORAL_TABLET | Freq: Every day | ORAL | 3 refills | Status: DC
Start: 2016-06-24 — End: 2017-03-13

## 2016-07-02 ENCOUNTER — Other Ambulatory Visit: Payer: Self-pay | Admitting: Hematology and Oncology

## 2016-07-02 DIAGNOSIS — C9001 Multiple myeloma in remission: Secondary | ICD-10-CM

## 2016-07-02 DIAGNOSIS — M810 Age-related osteoporosis without current pathological fracture: Secondary | ICD-10-CM

## 2016-07-06 DIAGNOSIS — S61412A Laceration without foreign body of left hand, initial encounter: Secondary | ICD-10-CM | POA: Diagnosis not present

## 2016-07-06 DIAGNOSIS — W268XXA Contact with other sharp object(s), not elsewhere classified, initial encounter: Secondary | ICD-10-CM | POA: Diagnosis not present

## 2016-08-21 DIAGNOSIS — I10 Essential (primary) hypertension: Secondary | ICD-10-CM | POA: Diagnosis not present

## 2016-08-21 DIAGNOSIS — Z7984 Long term (current) use of oral hypoglycemic drugs: Secondary | ICD-10-CM | POA: Diagnosis not present

## 2016-08-21 DIAGNOSIS — Z87891 Personal history of nicotine dependence: Secondary | ICD-10-CM | POA: Diagnosis not present

## 2016-08-21 DIAGNOSIS — Z79899 Other long term (current) drug therapy: Secondary | ICD-10-CM | POA: Diagnosis not present

## 2016-08-21 DIAGNOSIS — E119 Type 2 diabetes mellitus without complications: Secondary | ICD-10-CM | POA: Diagnosis not present

## 2016-08-21 DIAGNOSIS — E785 Hyperlipidemia, unspecified: Secondary | ICD-10-CM | POA: Diagnosis not present

## 2016-08-21 DIAGNOSIS — H9042 Sensorineural hearing loss, unilateral, left ear, with unrestricted hearing on the contralateral side: Secondary | ICD-10-CM | POA: Diagnosis not present

## 2016-09-04 ENCOUNTER — Other Ambulatory Visit (HOSPITAL_BASED_OUTPATIENT_CLINIC_OR_DEPARTMENT_OTHER): Payer: Medicare Other

## 2016-09-04 DIAGNOSIS — C9001 Multiple myeloma in remission: Secondary | ICD-10-CM | POA: Diagnosis not present

## 2016-09-04 LAB — COMPREHENSIVE METABOLIC PANEL
ALT: 34 U/L (ref 0–55)
AST: 24 U/L (ref 5–34)
Albumin: 3.6 g/dL (ref 3.5–5.0)
Alkaline Phosphatase: 80 U/L (ref 40–150)
Anion Gap: 9 mEq/L (ref 3–11)
BILIRUBIN TOTAL: 0.51 mg/dL (ref 0.20–1.20)
BUN: 15 mg/dL (ref 7.0–26.0)
CALCIUM: 9.9 mg/dL (ref 8.4–10.4)
CHLORIDE: 110 meq/L — AB (ref 98–109)
CO2: 23 meq/L (ref 22–29)
Creatinine: 0.8 mg/dL (ref 0.6–1.1)
EGFR: 74 mL/min/{1.73_m2} — ABNORMAL LOW (ref >90)
Glucose: 181 mg/dl — ABNORMAL HIGH (ref 70–140)
POTASSIUM: 4.2 meq/L (ref 3.5–5.1)
SODIUM: 143 meq/L (ref 136–145)
Total Protein: 6.7 g/dL (ref 6.4–8.3)

## 2016-09-04 LAB — CBC WITH DIFFERENTIAL/PLATELET
BASO%: 0.3 % (ref 0.0–2.0)
BASOS ABS: 0 10*3/uL (ref 0.0–0.1)
EOS%: 3.6 % (ref 0.0–7.0)
Eosinophils Absolute: 0.1 10*3/uL (ref 0.0–0.5)
HEMATOCRIT: 37.2 % (ref 34.8–46.6)
HGB: 11.9 g/dL (ref 11.6–15.9)
LYMPH#: 1 10*3/uL (ref 0.9–3.3)
LYMPH%: 28.6 % (ref 14.0–49.7)
MCH: 31.7 pg (ref 25.1–34.0)
MCHC: 32 g/dL (ref 31.5–36.0)
MCV: 99.2 fL (ref 79.5–101.0)
MONO#: 0.2 10*3/uL (ref 0.1–0.9)
MONO%: 6 % (ref 0.0–14.0)
NEUT#: 2.2 10*3/uL (ref 1.5–6.5)
NEUT%: 61.5 % (ref 38.4–76.8)
Platelets: 170 10*3/uL (ref 145–400)
RBC: 3.75 10*6/uL (ref 3.70–5.45)
RDW: 14.8 % — ABNORMAL HIGH (ref 11.2–14.5)
WBC: 3.6 10*3/uL — ABNORMAL LOW (ref 3.9–10.3)

## 2016-09-05 ENCOUNTER — Other Ambulatory Visit: Payer: Medicare Other

## 2016-09-05 LAB — KAPPA/LAMBDA LIGHT CHAINS
IG KAPPA FREE LIGHT CHAIN: 12.5 mg/L (ref 3.3–19.4)
Ig Lambda Free Light Chain: 11.8 mg/L (ref 5.7–26.3)
Kappa/Lambda FluidC Ratio: 1.06 (ref 0.26–1.65)

## 2016-09-08 LAB — MULTIPLE MYELOMA PANEL, SERUM
ALBUMIN SERPL ELPH-MCNC: 3.7 g/dL (ref 2.9–4.4)
ALPHA 1: 0.2 g/dL (ref 0.0–0.4)
Albumin/Glob SerPl: 1.4 (ref 0.7–1.7)
Alpha2 Glob SerPl Elph-Mcnc: 0.7 g/dL (ref 0.4–1.0)
B-Globulin SerPl Elph-Mcnc: 1.1 g/dL (ref 0.7–1.3)
GLOBULIN, TOTAL: 2.8 g/dL (ref 2.2–3.9)
Gamma Glob SerPl Elph-Mcnc: 0.8 g/dL (ref 0.4–1.8)
IGA/IMMUNOGLOBULIN A, SERUM: 128 mg/dL (ref 87–352)
IGM (IMMUNOGLOBIN M), SRM: 31 mg/dL (ref 26–217)
IgG, Qn, Serum: 796 mg/dL (ref 700–1600)
M Protein SerPl Elph-Mcnc: 0.2 g/dL — ABNORMAL HIGH
Total Protein: 6.5 g/dL (ref 6.0–8.5)

## 2016-09-12 ENCOUNTER — Encounter: Payer: Self-pay | Admitting: Hematology and Oncology

## 2016-09-12 ENCOUNTER — Telehealth: Payer: Self-pay | Admitting: Hematology and Oncology

## 2016-09-12 ENCOUNTER — Ambulatory Visit (HOSPITAL_BASED_OUTPATIENT_CLINIC_OR_DEPARTMENT_OTHER): Payer: Medicare Other | Admitting: Hematology and Oncology

## 2016-09-12 VITALS — BP 150/56 | HR 97 | Temp 99.2°F | Resp 18 | Ht 65.0 in | Wt 202.5 lb

## 2016-09-12 DIAGNOSIS — T451X5A Adverse effect of antineoplastic and immunosuppressive drugs, initial encounter: Secondary | ICD-10-CM

## 2016-09-12 DIAGNOSIS — D701 Agranulocytosis secondary to cancer chemotherapy: Secondary | ICD-10-CM

## 2016-09-12 DIAGNOSIS — C9001 Multiple myeloma in remission: Secondary | ICD-10-CM | POA: Diagnosis not present

## 2016-09-12 DIAGNOSIS — D3A090 Benign carcinoid tumor of the bronchus and lung: Secondary | ICD-10-CM

## 2016-09-12 DIAGNOSIS — Z9484 Stem cells transplant status: Secondary | ICD-10-CM

## 2016-09-12 DIAGNOSIS — M8580 Other specified disorders of bone density and structure, unspecified site: Secondary | ICD-10-CM | POA: Insufficient documentation

## 2016-09-12 NOTE — Telephone Encounter (Signed)
Scheduled appt per 8/17 los - Gave patient AVS and calender per los  

## 2016-09-12 NOTE — Assessment & Plan Note (Signed)
Her myeloma treatment has to be discontinued due to worsening neuropathy. We discussed the risks and benefits of discontinuation of treatment. She stopped Ninlaro in March 2017 Recent blood work showed that she continues to remain in remission I am also lengthening her Zometa treatment to every 6 months. She has no evidence of osteonecrosis of the jaw. She will continue calcium with vitamin D supplement. She agreed with the plan of care. I will see her back in 3 months for further assessment

## 2016-09-12 NOTE — Assessment & Plan Note (Signed)
She is not symptomatic. Observe only. She will continue follow-up at [facility]

## 2016-09-12 NOTE — Assessment & Plan Note (Signed)
This is likely due to recent treatment. The patient denies recent history of fevers, cough, chills, diarrhea or dysuria. She is asymptomatic from the leukopenia. I will observe for now.

## 2016-09-12 NOTE — Progress Notes (Signed)
[place] OFFICE PROGRESS NOTE  Patient Care Team: Maurice Small, MD as PCP - General (Family Medicine) Laurence Spates, MD as Attending Physician (Gastroenterology) Jerrell Belfast, MD as Attending Physician (Otolaryngology) Collene Gobble, MD as Attending Physician (Pulmonary Disease) [place], Glorianne Manchester, DO as Consulting Physician (Hematology)  SUMMARY OF ONCOLOGIC HISTORY:   Multiple myeloma in remission ([place])   05/27/2012 Initial Diagnosis    Multiple myeloma, without mention of having achieved remission(203.00)      05/27/2012 Cancer Staging    1.  IgG kappa multiple myeloma. She presented in 05/2012 with anemia, Cr 0.8, Ca 9.6; no bone lytic lesion; LDH 140; beta2- microglobulin 1.86 (ref 1.01-1.73). Positive SPEP for M-spike at 1.69, IgG 2080, kappa 7.24, kappa:lambda ratio 24.97; UPEP positive      06/08/2012 Bone Marrow Biopsy    Bone marrow biopsy on 06/08/12 showed 19% plasma cell; normal cytogenetics; myeloma FISH pending.       06/14/2012 Imaging    Skeletal survey negative for bone lytic lesions.  Lung nodule apparent.      09/17/2012 Bone Marrow Biopsy    Bone marrow biopsy showed 11% plasma cells.      10/18/2012 - 12/31/2012 Chemotherapy    Started on VRD (Velcade 1.3 mg/m^2 [place] days 1,8 and 15; Revlimide 25 mg PO daily, on day 1 through 14; Decadron 40 mg PO Days 1, 8, 15).   Started on coumadin given RD.      01/14/2013 Bone Marrow Biopsy    The bone marrow clot sections are normocellular toslightly hypercellular for age (approximately 40% overall). 1% plasma cells.      02/08/2013 Bone Marrow Transplant    Auto Transplant at [facility] (Dr. Glorianne Manchester McIver).      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 Lamonte Sakai)      07/08/2012 Pathology Results    Pathology consistent with Non-small cell lung cancer of RLL.       07/14/2012 Imaging    1. Right lower lobe nodule is non hypermetabolic which suggests benign etiology considering the slow growing rate in 6 years or non FDG avid disease as seen with carcinoid tumors. Pending biopsy. No PET evidence of mediastinal nodal disease.       08/02/2012 Imaging    MRI of brain negative.      08/12/2012 Surgery    Robotic Lobectomy of R Lower lobe.       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     Surgical resection margins are negative.     Two benign lymph nodes.     pTa1N0       INTERVAL HISTORY: Please  see below for problem oriented charting. He returns for further follow-up She denies recent dental issues Denies new bone pain No recent infection She has no recent follow-up yet at [facility] for her history of carcinoid She denies recent cough, chest pain or shortness of breath  REVIEW OF SYSTEMS:   Constitutional: Denies fevers, chills or abnormal weight loss Eyes: Denies blurriness of vision Ears, nose, mouth, throat, and face: Denies mucositis or sore throat Respiratory: Denies cough, dyspnea or wheezes Cardiovascular: Denies palpitation, chest discomfort or lower extremity swelling Gastrointestinal:  Denies nausea, heartburn or change in bowel habits Skin: Denies abnormal skin rashes Lymphatics: Denies new lymphadenopathy or easy bruising Neurological:Denies numbness,  tingling or new weaknesses Behavioral/Psych: Mood is stable, no new changes  All other systems were reviewed with the patient and are negative.  I have reviewed the past medical history, past surgical history, social history and family history with the patient and they are unchanged from previous note.  ALLERGIES:  has No Known Allergies.  MEDICATIONS:  Current Outpatient Prescriptions  Medication Sig Dispense Refill  . acyclovir (ZOVIRAX) 400 MG tablet Take 1 tablet (400 mg total) by mouth daily. 90 tablet 3  . buPROPion (WELLBUTRIN XL) 300 MG 24 hr tablet Take 300 mg by mouth daily.    . cholecalciferol (VITAMIN D) 1000 UNITS tablet Take 2,000 Units by mouth daily.    . ciprofloxacin (CIPRO) 500 MG tablet Take 500 mg by mouth 2 (two) times daily. for 10 days  0  . clotrimazole (LOTRIMIN) 1 % cream Apply 1 application topically 2 (two) times daily.  0  . losartan (COZAAR) 50 MG tablet Take 50 mg by mouth daily.    . magnesium chloride (SLOW-MAG) 64 MG TBEC SR tablet Take 1 tablet by mouth 2 (two) times daily.     . metFORMIN (GLUCOPHAGE) 500 MG tablet Take 1 tablet (500 mg total) by mouth 2 (two) times daily with a meal. 60 tablet 0  . metoprolol succinate (TOPROL-XL) 50 MG 24 hr tablet Take 50 mg by mouth daily. Take with or immediately following a meal.    . Multiple Vitamins-Minerals (MULTIVITAMIN WITH MINERALS) tablet Take 1 tablet by mouth at bedtime.     . Multiple Vitamins-Minerals (PRESERVISION AREDS 2) CAPS Take 1 capsule by mouth 2 (two) times daily.    . predniSONE (DELTASONE) 20 MG tablet Take 20 mg by mouth daily. Take 3 tablets by mouth daily x 3 days, then 2 tables daily x 2 days, then 1 tablet daily x 2 days.    . ranitidine (ZANTAC) 150 MG capsule Take 150 mg by mouth daily.    . sertraline (ZOLOFT) 100 MG tablet Take 100 mg by mouth daily.     . simvastatin (ZOCOR) 40 MG tablet Take 40 mg by mouth at bedtime.      No current facility-administered medications for this  visit.     PHYSICAL EXAMINATION: ECOG PERFORMANCE STATUS: 0 - Asymptomatic  Vitals:   09/12/16 1425  BP: (!) 150/56  Pulse: 97  Resp: 18  Temp: 99.2 F (37.3 C)  SpO2: 92%   Filed Weights   09/12/16 1425  Weight: 202 lb 8 oz (91.9 kg)    GENERAL:alert, no distress and comfortable SKIN: skin color, texture, turgor are normal, no rashes or significant lesions EYES: normal, Conjunctiva are pink and non-injected, sclera clear OROPHARYNX:no exudate, no erythema and lips, buccal mucosa, and tongue normal  NECK: supple, thyroid normal size, non-tender, without nodularity LYMPH:  no  palpable lymphadenopathy in the cervical, axillary or inguinal LUNGS: clear to auscultation and percussion with normal breathing effort HEART: regular rate & rhythm and no murmurs and no lower extremity edema ABDOMEN:abdomen soft, non-tender and normal bowel sounds Musculoskeletal:no cyanosis of digits and no clubbing  NEURO: alert & oriented x 3 with fluent speech, no focal motor/sensory deficits  LABORATORY DATA:  I have reviewed the data as listed    Component Value Date/Time   NA 143 09/04/2016 1546   K 4.2 09/04/2016 1546   CL 107 03/15/2014 1741   CL 105 06/08/2012 0913   CO2 23 09/04/2016 1546   GLUCOSE 181 (H) 09/04/2016 1546   GLUCOSE 120 (H) 06/08/2012 0913   BUN 15.0 09/04/2016 1546   CREATININE 0.8 09/04/2016 1546   CALCIUM 9.9 09/04/2016 1546   PROT 6.7 09/04/2016 1546   PROT 6.5 09/04/2016 1546   ALBUMIN 3.6 09/04/2016 1546   AST 24 09/04/2016 1546   ALT 34 09/04/2016 1546   ALKPHOS 80 09/04/2016 1546   BILITOT 0.51 09/04/2016 1546   GFRNONAA 87 (L) 10/10/2013 0726   GFRAA >90 10/10/2013 0726    No results found for: SPEP, UPEP  Lab Results  Component Value Date   WBC 3.6 (L) 09/04/2016   NEUTROABS 2.2 09/04/2016   HGB 11.9 09/04/2016   HCT 37.2 09/04/2016   MCV 99.2 09/04/2016   PLT 170 09/04/2016      Chemistry      Component Value Date/Time   NA 143  09/04/2016 1546   K 4.2 09/04/2016 1546   CL 107 03/15/2014 1741   CL 105 06/08/2012 0913   CO2 23 09/04/2016 1546   BUN 15.0 09/04/2016 1546   CREATININE 0.8 09/04/2016 1546      Component Value Date/Time   CALCIUM 9.9 09/04/2016 1546   ALKPHOS 80 09/04/2016 1546   AST 24 09/04/2016 1546   ALT 34 09/04/2016 1546   BILITOT 0.51 09/04/2016 1546       ASSESSMENT & PLAN:  Multiple myeloma in remission Her myeloma treatment has to be discontinued due to worsening neuropathy. We discussed the risks and benefits of discontinuation of treatment. She stopped Ninlaro in March 2017 Recent blood work showed that she continues to remain in remission I am also lengthening her Zometa treatment to every 6 months. She has no evidence of osteonecrosis of the jaw. She will continue calcium with vitamin D supplement. She agreed with the plan of care. I will see her back in 3 months for further assessment  Carcinoid tumor of left lung She is not symptomatic. Observe only. She will continue follow-up at [facility]  Leukopenia due to antineoplastic chemotherapy This is likely due to recent treatment. The patient denies recent history of fevers, cough, chills, diarrhea or dysuria. She is asymptomatic from the leukopenia. I will observe for now.    Orders Placed This Encounter  Procedures  . DG Bone Density    Standing Status:   Future    Standing Expiration Date:   10/17/2017    Order Specific Question:   Reason for exam:    Answer:   osteopenia    Order Specific Question:   Preferred imaging location?    Answer:   [facility]  . CBC with Differential/Platelet    Standing Status:   Future    Standing Expiration Date:   10/17/2017  . Comprehensive metabolic panel    Standing Status:   Future    Standing Expiration Date:   10/17/2017  .  Kappa/lambda light chains    Standing Status:   Future    Standing Expiration Date:   10/17/2017  . Multiple Myeloma Panel (SPEP&IFE w/QIG)     Standing Status:   Future    Standing Expiration Date:   10/17/2017   All questions were answered. The patient knows to call the clinic with any problems, questions or concerns. No barriers to learning was detected. I spent 15 minutes counseling the patient face to face. The total time spent in the appointment was 20 minutes and more than 50% was on counseling and review of test results     Heath Lark, MD 09/12/2016 2:35 PM

## 2016-09-24 ENCOUNTER — Encounter: Payer: Self-pay | Admitting: Hematology and Oncology

## 2016-10-08 ENCOUNTER — Other Ambulatory Visit: Payer: Self-pay | Admitting: Family Medicine

## 2016-10-08 DIAGNOSIS — Z1231 Encounter for screening mammogram for malignant neoplasm of breast: Secondary | ICD-10-CM

## 2016-10-12 DIAGNOSIS — N3001 Acute cystitis with hematuria: Secondary | ICD-10-CM | POA: Diagnosis not present

## 2016-10-14 DIAGNOSIS — H9042 Sensorineural hearing loss, unilateral, left ear, with unrestricted hearing on the contralateral side: Secondary | ICD-10-CM | POA: Diagnosis not present

## 2016-10-28 ENCOUNTER — Ambulatory Visit: Payer: Medicare Other

## 2016-10-28 ENCOUNTER — Ambulatory Visit
Admission: RE | Admit: 2016-10-28 | Discharge: 2016-10-28 | Disposition: A | Discharge status: Home / Self Care | Payer: Medicare Other | Source: Ambulatory Visit | Attending: Family Medicine | Admitting: Family Medicine

## 2016-10-28 DIAGNOSIS — Z1231 Encounter for screening mammogram for malignant neoplasm of breast: Secondary | ICD-10-CM

## 2016-10-30 ENCOUNTER — Other Ambulatory Visit: Payer: Medicare Other

## 2016-10-30 ENCOUNTER — Ambulatory Visit
Admission: RE | Admit: 2016-10-30 | Discharge: 2016-10-30 | Disposition: A | Discharge status: Home / Self Care | Payer: Medicare Other | Source: Ambulatory Visit | Attending: Hematology and Oncology | Admitting: Hematology and Oncology

## 2016-10-30 DIAGNOSIS — M8589 Other specified disorders of bone density and structure, multiple sites: Secondary | ICD-10-CM | POA: Diagnosis not present

## 2016-10-30 DIAGNOSIS — M8580 Other specified disorders of bone density and structure, unspecified site: Secondary | ICD-10-CM

## 2016-10-30 DIAGNOSIS — Z9484 Stem cells transplant status: Secondary | ICD-10-CM

## 2016-10-30 DIAGNOSIS — Z780 Asymptomatic menopausal state: Secondary | ICD-10-CM | POA: Diagnosis not present

## 2016-10-30 DIAGNOSIS — C9001 Multiple myeloma in remission: Secondary | ICD-10-CM

## 2016-11-20 DIAGNOSIS — Z23 Encounter for immunization: Secondary | ICD-10-CM | POA: Diagnosis not present

## 2016-12-03 IMAGING — MG DIGITAL SCREENING BILATERAL MAMMOGRAM WITH CAD
4 series · 4 of 4 positions shown · non-contrast
Comparison: Previous exam(s).

CLINICAL DATA: Screening.

EXAM:
DIGITAL SCREENING BILATERAL MAMMOGRAM WITH CAD

[R CC]
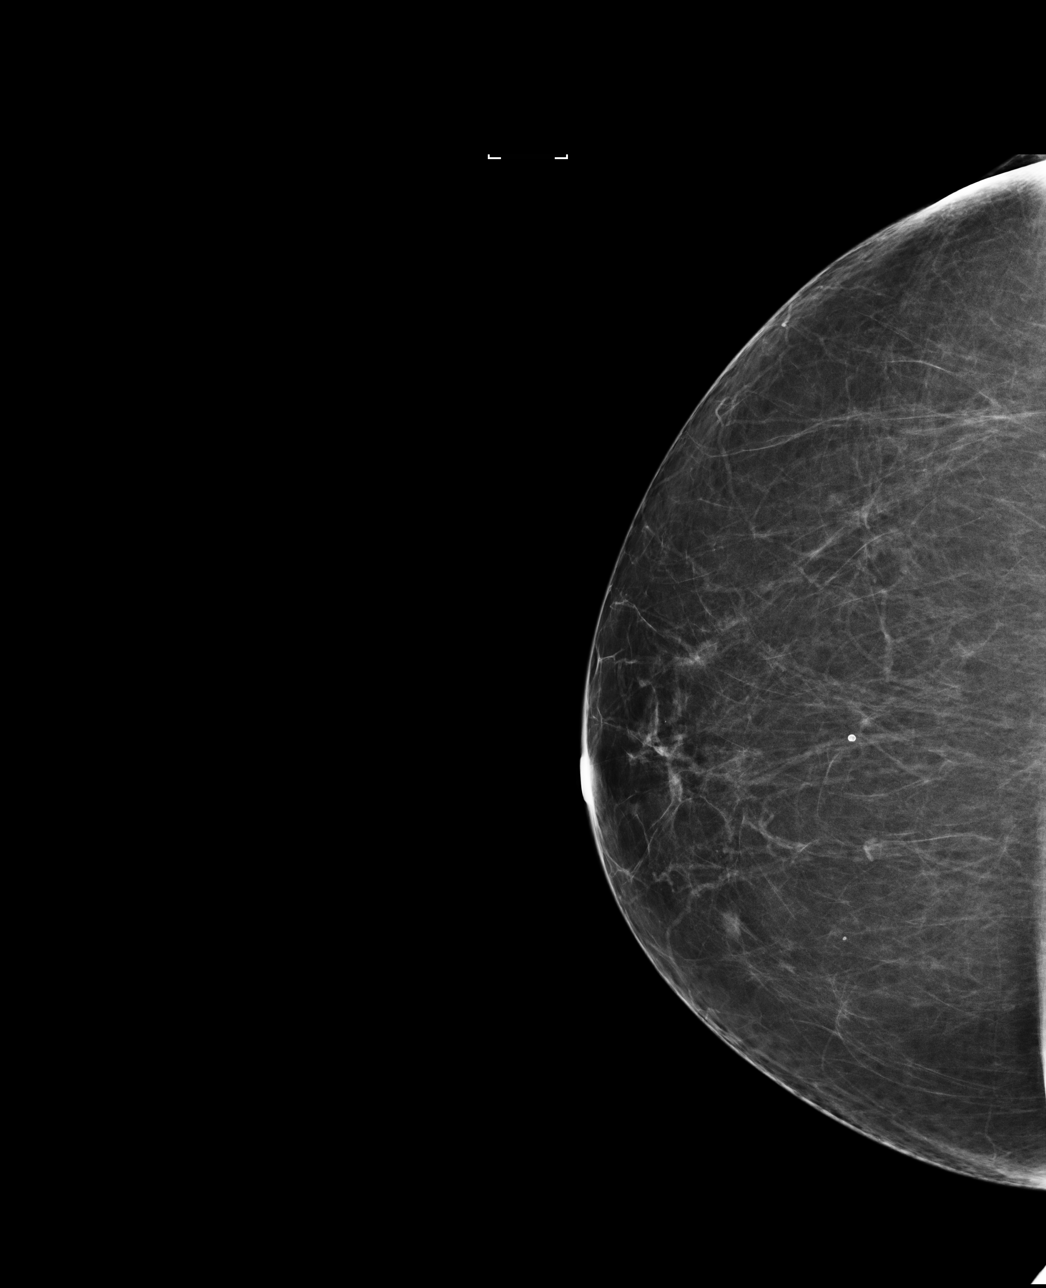

[L CC]
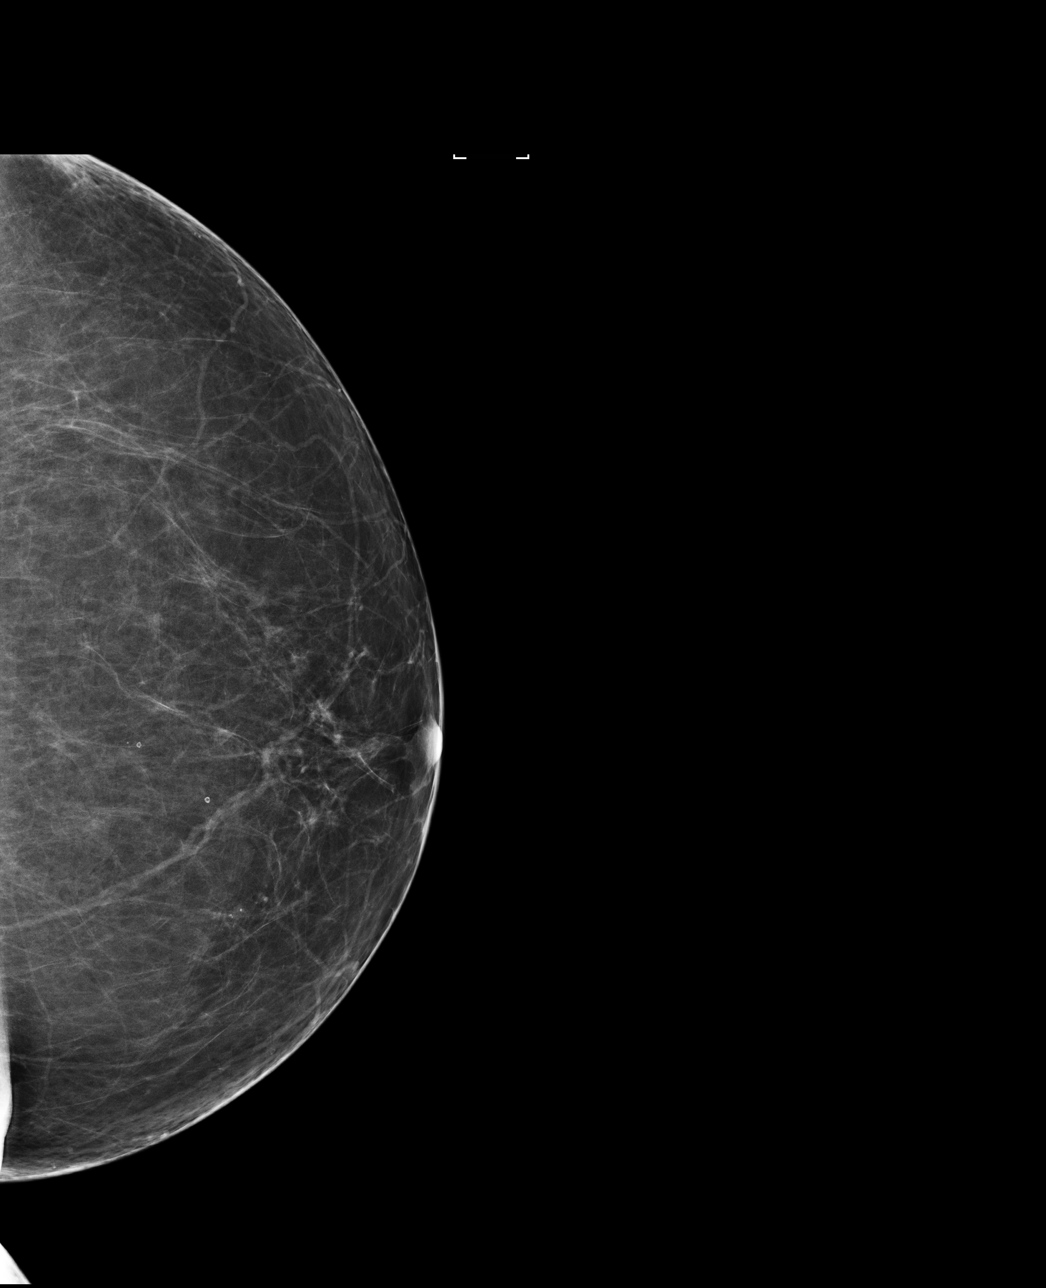

[L MLO]
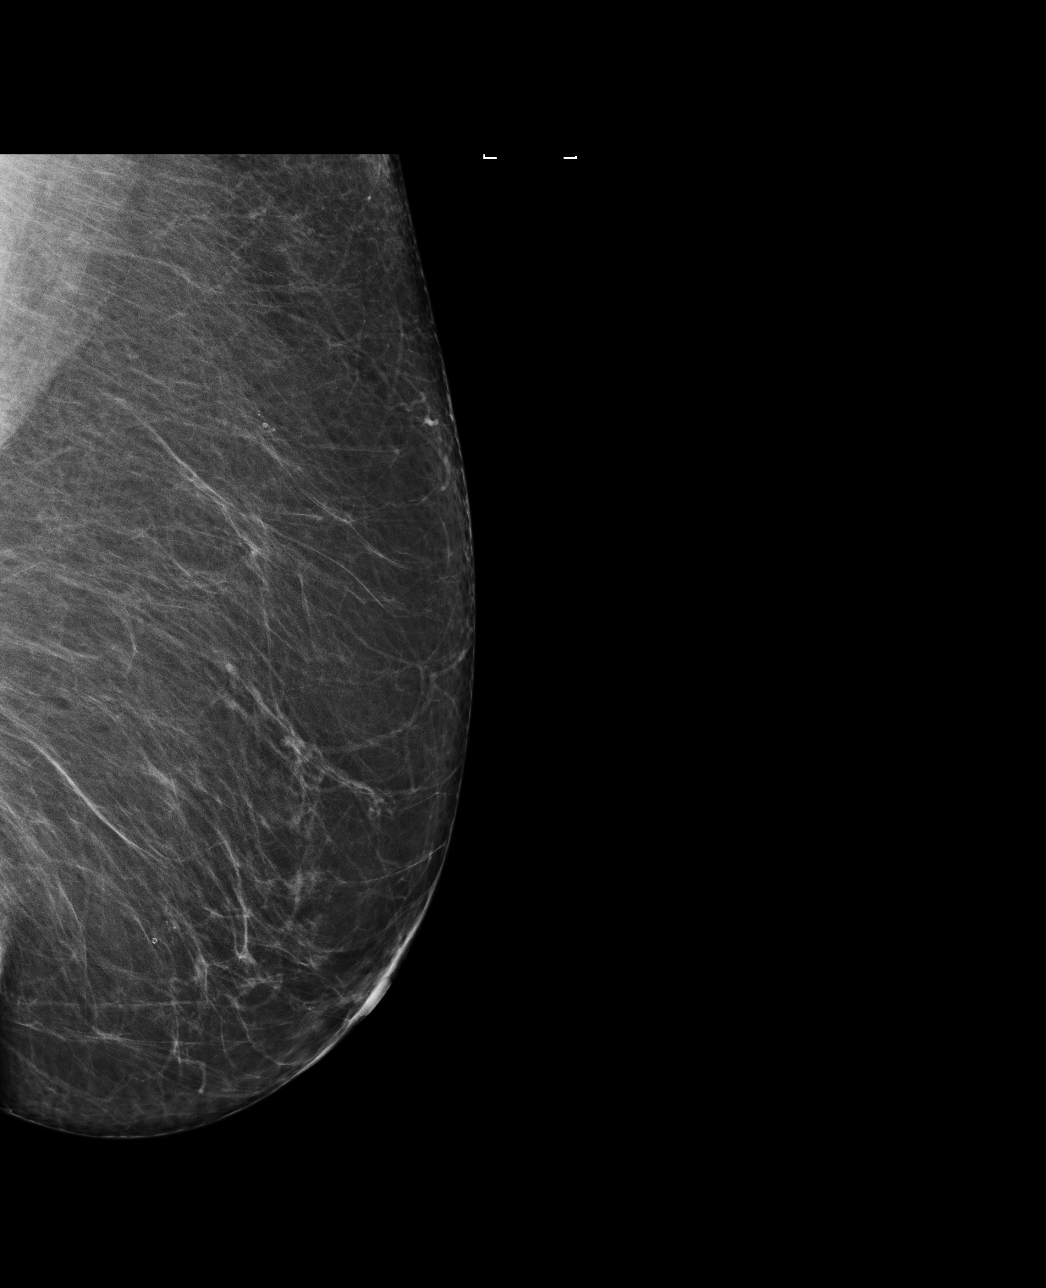

[R MLO]
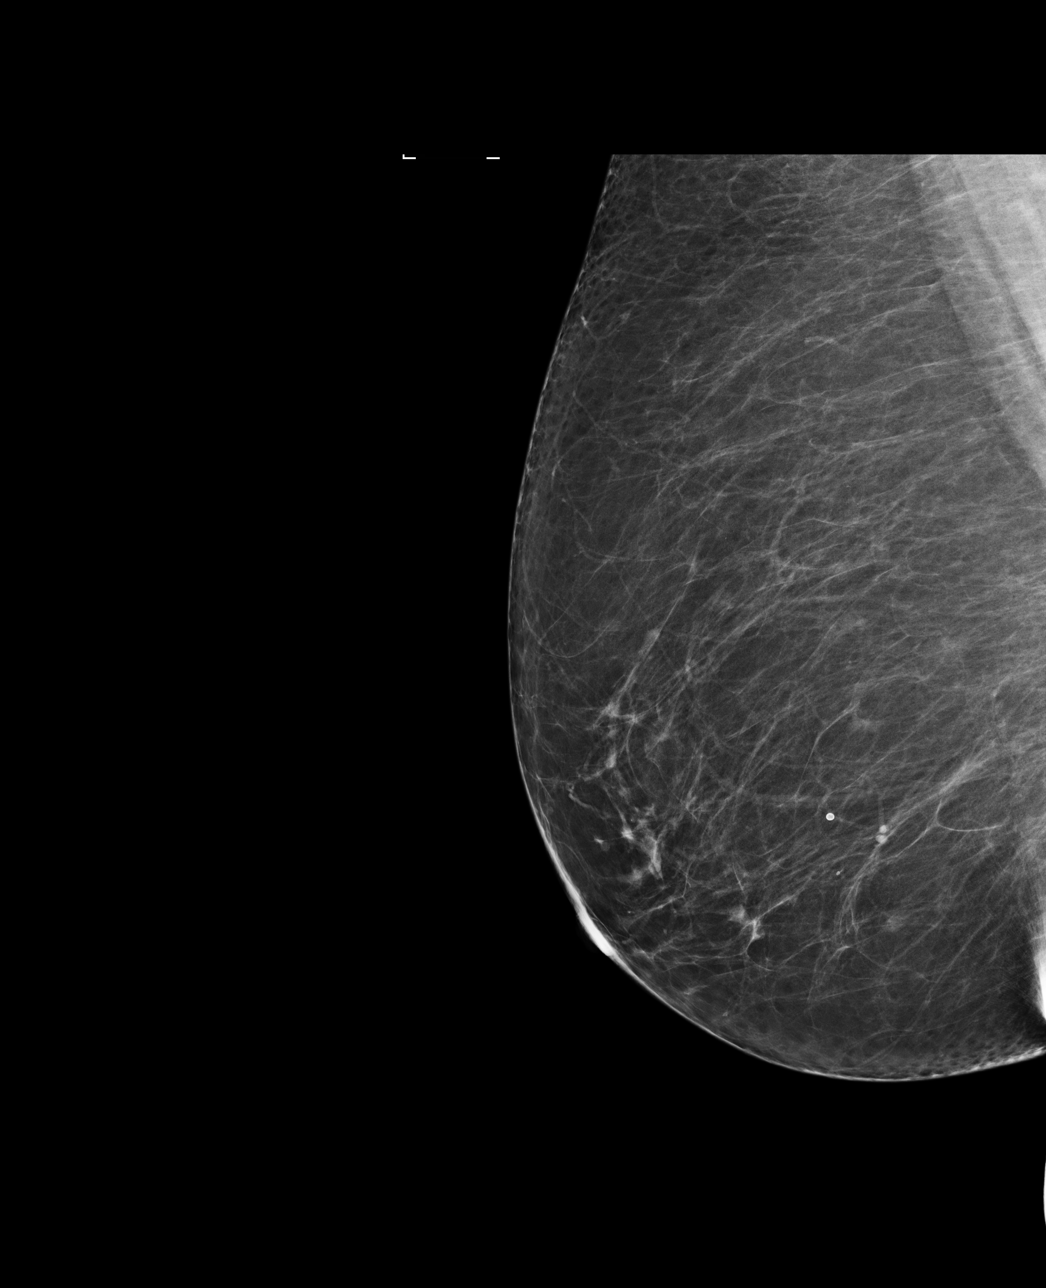

[4 of 4 positions shown; findings below may reference images not displayed]

ACR Breast Density Category b: There are scattered areas of
fibroglandular density.
FINDINGS: There are no findings suspicious for malignancy. Images were
processed with CAD.
IMPRESSION: No mammographic evidence of malignancy. A result letter of this
screening mammogram will be mailed directly to the patient.

RECOMMENDATION:
Screening mammogram in one year. (Code:AS-G-LCT)

BI-RADS CATEGORY  1: Negative.

## 2016-12-05 ENCOUNTER — Other Ambulatory Visit (HOSPITAL_BASED_OUTPATIENT_CLINIC_OR_DEPARTMENT_OTHER): Payer: Medicare Other

## 2016-12-05 DIAGNOSIS — C9001 Multiple myeloma in remission: Secondary | ICD-10-CM | POA: Diagnosis not present

## 2016-12-05 LAB — CBC WITH DIFFERENTIAL/PLATELET
BASO%: 0.3 % (ref 0.0–2.0)
Basophils Absolute: 0 10*3/uL (ref 0.0–0.1)
EOS ABS: 0.1 10*3/uL (ref 0.0–0.5)
EOS%: 2.8 % (ref 0.0–7.0)
HEMATOCRIT: 36.2 % (ref 34.8–46.6)
HEMOGLOBIN: 11.7 g/dL (ref 11.6–15.9)
LYMPH%: 31.4 % (ref 14.0–49.7)
MCH: 31.5 pg (ref 25.1–34.0)
MCHC: 32.3 g/dL (ref 31.5–36.0)
MCV: 97.6 fL (ref 79.5–101.0)
MONO#: 0.3 10*3/uL (ref 0.1–0.9)
MONO%: 7.6 % (ref 0.0–14.0)
NEUT%: 57.9 % (ref 38.4–76.8)
NEUTROS ABS: 2.1 10*3/uL (ref 1.5–6.5)
Platelets: 162 10*3/uL (ref 145–400)
RBC: 3.71 10*6/uL (ref 3.70–5.45)
RDW: 14.6 % — AB (ref 11.2–14.5)
WBC: 3.6 10*3/uL — ABNORMAL LOW (ref 3.9–10.3)
lymph#: 1.1 10*3/uL (ref 0.9–3.3)

## 2016-12-05 LAB — COMPREHENSIVE METABOLIC PANEL
ALBUMIN: 3.7 g/dL (ref 3.5–5.0)
ALK PHOS: 85 U/L (ref 40–150)
ALT: 43 U/L (ref 0–55)
ANION GAP: 9 meq/L (ref 3–11)
AST: 27 U/L (ref 5–34)
BUN: 17 mg/dL (ref 7.0–26.0)
CALCIUM: 9.1 mg/dL (ref 8.4–10.4)
CO2: 23 mEq/L (ref 22–29)
Chloride: 110 mEq/L — ABNORMAL HIGH (ref 98–109)
Creatinine: 0.8 mg/dL (ref 0.6–1.1)
EGFR: >60 mL/min/{1.73_m2} (ref >60)
Glucose: 131 mg/dl (ref 70–140)
POTASSIUM: 4.3 meq/L (ref 3.5–5.1)
Sodium: 141 mEq/L (ref 136–145)
Total Bilirubin: 0.68 mg/dL (ref 0.20–1.20)
Total Protein: 6.8 g/dL (ref 6.4–8.3)

## 2016-12-08 LAB — KAPPA/LAMBDA LIGHT CHAINS
IG KAPPA FREE LIGHT CHAIN: 12.2 mg/L (ref 3.3–19.4)
Ig Lambda Free Light Chain: 11.6 mg/L (ref 5.7–26.3)
Kappa/Lambda FluidC Ratio: 1.05 (ref 0.26–1.65)

## 2016-12-09 LAB — MULTIPLE MYELOMA PANEL, SERUM
ALBUMIN/GLOB SERPL: 1.5 (ref 0.7–1.7)
ALPHA 1: 0.2 g/dL (ref 0.0–0.4)
ALPHA2 GLOB SERPL ELPH-MCNC: 0.7 g/dL (ref 0.4–1.0)
Albumin SerPl Elph-Mcnc: 3.7 g/dL (ref 2.9–4.4)
B-GLOBULIN SERPL ELPH-MCNC: 1.1 g/dL (ref 0.7–1.3)
Gamma Glob SerPl Elph-Mcnc: 0.6 g/dL (ref 0.4–1.8)
Globulin, Total: 2.6 g/dL (ref 2.2–3.9)
IGG (IMMUNOGLOBIN G), SERUM: 781 mg/dL (ref 700–1600)
IGM (IMMUNOGLOBIN M), SRM: 26 mg/dL (ref 26–217)
IgA, Qn, Serum: 119 mg/dL (ref 87–352)
TOTAL PROTEIN: 6.3 g/dL (ref 6.0–8.5)

## 2016-12-12 ENCOUNTER — Ambulatory Visit (HOSPITAL_BASED_OUTPATIENT_CLINIC_OR_DEPARTMENT_OTHER): Payer: Medicare Other

## 2016-12-12 ENCOUNTER — Ambulatory Visit (HOSPITAL_BASED_OUTPATIENT_CLINIC_OR_DEPARTMENT_OTHER): Payer: Medicare Other | Admitting: Hematology and Oncology

## 2016-12-12 VITALS — BP 157/65 | HR 77 | Temp 97.9°F | Resp 18 | Ht 65.0 in | Wt 207.9 lb

## 2016-12-12 DIAGNOSIS — R5383 Other fatigue: Secondary | ICD-10-CM

## 2016-12-12 DIAGNOSIS — C9001 Multiple myeloma in remission: Secondary | ICD-10-CM

## 2016-12-12 DIAGNOSIS — I10 Essential (primary) hypertension: Secondary | ICD-10-CM | POA: Diagnosis not present

## 2016-12-12 DIAGNOSIS — D701 Agranulocytosis secondary to cancer chemotherapy: Secondary | ICD-10-CM

## 2016-12-12 DIAGNOSIS — D3A090 Benign carcinoid tumor of the bronchus and lung: Secondary | ICD-10-CM | POA: Diagnosis not present

## 2016-12-12 DIAGNOSIS — T451X5A Adverse effect of antineoplastic and immunosuppressive drugs, initial encounter: Secondary | ICD-10-CM

## 2016-12-12 MED ORDER — ZOLEDRONIC ACID 4 MG/100ML IV SOLN
4.0000 mg | Freq: Once | INTRAVENOUS | Status: AC
  Administered 2016-12-12: 4 mg via INTRAVENOUS
  Filled 2016-12-12: qty 100

## 2016-12-12 MED ORDER — SODIUM CHLORIDE 0.9 % IV SOLN
Freq: Once | INTRAVENOUS | Status: AC
Start: 2016-12-12 — End: 2016-12-12
  Administered 2016-12-12: 16:00:00 via INTRAVENOUS

## 2016-12-12 NOTE — Assessment & Plan Note (Signed)
Her myeloma treatment has to be discontinued due to worsening neuropathy. We discussed the risks and benefits of discontinuation of treatment. She stopped Ninlaro in March 2017 Recent blood work showed that she continues to remain in remission I am also lengthening her Zometa treatment to every 6 months. She has no evidence of osteonecrosis of the jaw. She will continue calcium with vitamin D supplement. She agreed with the plan of care. I will see her back in 3 months for further assessment

## 2016-12-12 NOTE — Assessment & Plan Note (Signed)
She is not symptomatic. Observe only. She will continue follow-up at [facility]

## 2016-12-12 NOTE — Assessment & Plan Note (Signed)
she will continue current medical management. I recommend close follow-up with primary care doctor for medication adjustment.  

## 2016-12-12 NOTE — Assessment & Plan Note (Signed)
This is likely due to recent treatment. The patient denies recent history of fevers, cough, chills, diarrhea or dysuria. She is asymptomatic from the leukopenia. I will observe for now.

## 2016-12-12 NOTE — Patient Instructions (Signed)
[facility] Discharge Instructions for Patients Receiving Chemotherapy  Today you received the following chemotherapy agents Zometa  To help prevent nausea and vomiting after your treatment, we encourage you to take your nausea medication as directed  If you develop nausea and vomiting that is not controlled by your nausea medication, call the clinic.   BELOW ARE SYMPTOMS THAT SHOULD BE REPORTED IMMEDIATELY:  *FEVER GREATER THAN 100.5 F  *CHILLS WITH OR WITHOUT FEVER  NAUSEA AND VOMITING THAT IS NOT CONTROLLED WITH YOUR NAUSEA MEDICATION  *UNUSUAL SHORTNESS OF BREATH  *UNUSUAL BRUISING OR BLEEDING  TENDERNESS IN MOUTH AND THROAT WITH OR WITHOUT PRESENCE OF ULCERS  *URINARY PROBLEMS  *BOWEL PROBLEMS  UNUSUAL RASH Items with * indicate a potential emergency and should be followed up as soon as possible.  Feel free to call the clinic should you have any questions or concerns. The clinic phone number is [phone].  Please show the [facility] at check-in to the Emergency Department and triage nurse.

## 2016-12-12 NOTE — Progress Notes (Signed)
[place] OFFICE PROGRESS NOTE  Patient Care Team: Maurice Small, MD as PCP - General (Family Medicine) Laurence Spates, MD as Attending Physician (Gastroenterology) Jerrell Belfast, MD as Attending Physician (Otolaryngology) Collene Gobble, MD as Attending Physician (Pulmonary Disease) [place], Glorianne Manchester, DO as Consulting Physician (Hematology)  SUMMARY OF ONCOLOGIC HISTORY:   Multiple myeloma in remission ([place])   05/27/2012 Initial Diagnosis    Multiple myeloma, without mention of having achieved remission(203.00)      05/27/2012 Cancer Staging    1.  IgG kappa multiple myeloma. She presented in 05/2012 with anemia, Cr 0.8, Ca 9.6; no bone lytic lesion; LDH 140; beta2- microglobulin 1.86 (ref 1.01-1.73). Positive SPEP for M-spike at 1.69, IgG 2080, kappa 7.24, kappa:lambda ratio 24.97; UPEP positive      06/08/2012 Bone Marrow Biopsy    Bone marrow biopsy on 06/08/12 showed 19% plasma cell; normal cytogenetics; myeloma FISH pending.       06/14/2012 Imaging    Skeletal survey negative for bone lytic lesions.  Lung nodule apparent.      09/17/2012 Bone Marrow Biopsy    Bone marrow biopsy showed 11% plasma cells.      10/18/2012 - 12/31/2012 Chemotherapy    Started on VRD (Velcade 1.3 mg/m^2 [place] days 1,8 and 15; Revlimide 25 mg PO daily, on day 1 through 14; Decadron 40 mg PO Days 1, 8, 15).   Started on coumadin given RD.      01/14/2013 Bone Marrow Biopsy    The bone marrow clot sections are normocellular toslightly hypercellular for age (approximately 40% overall). 1% plasma cells.      02/08/2013 Bone Marrow Transplant    Auto Transplant at [facility] (Dr. Glorianne Manchester McIver).      11/30/2013 - 08/23/2014 Chemotherapy    Velcade maintence therapy to start today q 2 weeks, discontinued due to neuropathy      03/15/2014 Imaging    CT scan of the chest excluded pulmonary emboli      07/26/2014 Adverse Reaction    Velcade dose is reduced due to  neuropathy      09/26/2014 - 02/23/2015 Chemotherapy    Treatment was switched to Ninlaro due to neuropathy      02/23/2015 Adverse Reaction    Ninlaro was stopped due to worsening neuropathy      10/30/2016 Imaging    The BMD measured at Femur Neck Left is 0.763 g/cm2 with a T-score of -2.0. 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 Lamonte Sakai)      07/08/2012 Pathology Results    Pathology consistent with Non-small cell lung cancer of RLL.       07/14/2012 Imaging    1. Right lower lobe nodule is non hypermetabolic which suggests benign etiology considering the slow growing rate in 6 years or non FDG avid disease as seen with carcinoid tumors. Pending biopsy. No PET evidence of mediastinal nodal disease.       08/02/2012 Imaging    MRI of brain negative.      08/12/2012 Surgery    Robotic Lobectomy of R Lower lobe.       08/12/2012 Pathology Results    LUNG, RIGHT LOWER LOBE, EXCISION:  Well differentiated neuroendocrine tumor (carcinoid), 1.7cm.     Surgical resection margins are negative.     Two benign lymph nodes.     pTa1N0       INTERVAL HISTORY: Please see below for problem oriented charting. She returns for further follow-up She denies recent dental issues No new bone pain No recent infection Her appetite is stable, no recent weight loss She complain of mild fatigue She is up-to-date with her flu vaccination  REVIEW OF SYSTEMS:   Constitutional: Denies fevers, chills or abnormal weight loss Eyes: Denies blurriness of vision Ears, nose, mouth, throat, and face: Denies mucositis or sore throat Respiratory: Denies cough, dyspnea or wheezes Cardiovascular: Denies palpitation, chest discomfort or lower extremity swelling Gastrointestinal:   Denies nausea, heartburn or change in bowel habits Skin: Denies abnormal skin rashes Lymphatics: Denies new lymphadenopathy or easy bruising Neurological:Denies numbness, tingling or new weaknesses Behavioral/Psych: Mood is stable, no new changes  All other systems were reviewed with the patient and are negative.  I have reviewed the past medical history, past surgical history, social history and family history with the patient and they are unchanged from previous note.  ALLERGIES:  has No Known Allergies.  MEDICATIONS:  Current Outpatient Medications  Medication Sig Dispense Refill  . acyclovir (ZOVIRAX) 400 MG tablet Take 1 tablet (400 mg total) by mouth daily. 90 tablet 3  . buPROPion (WELLBUTRIN XL) 300 MG 24 hr tablet Take 300 mg by mouth daily.    . cholecalciferol (VITAMIN D) 1000 UNITS tablet Take 2,000 Units by mouth daily.    . clotrimazole (LOTRIMIN) 1 % cream Apply 1 application topically 2 (two) times daily.  0  . losartan (COZAAR) 50 MG tablet Take 50 mg by mouth daily.    . magnesium chloride (SLOW-MAG) 64 MG TBEC SR tablet Take 1 tablet by mouth 2 (two) times daily.     . metFORMIN (GLUCOPHAGE) 500 MG tablet Take 1 tablet (500 mg total) by mouth 2 (two) times daily with a meal. 60 tablet 0  . metoprolol succinate (TOPROL-XL) 50 MG 24 hr tablet Take 50 mg by mouth daily. Take with or immediately following a meal.    . Multiple Vitamins-Minerals (MULTIVITAMIN WITH MINERALS) tablet Take 1 tablet by mouth at bedtime.     . Multiple Vitamins-Minerals (PRESERVISION AREDS 2) CAPS Take 1 capsule by mouth 2 (two) times daily.    . ranitidine (ZANTAC) 150 MG capsule Take 150 mg by mouth daily.    . sertraline (ZOLOFT) 100 MG tablet Take 100 mg by mouth daily.     . simvastatin (ZOCOR) 40 MG tablet Take 40 mg by mouth at bedtime.      No current facility-administered medications for this visit.    Facility-Administered Medications Ordered in Other Visits  Medication Dose Route  Frequency Provider Last Rate Last Dose  . 0.9 %  sodium chloride infusion   Intravenous Once Alvy Bimler, Cataleyah Colborn, MD      . Zoledronic Acid (ZOMETA) 4 mg IVPB  4 mg Intravenous Once Alvy Bimler, Illianna Paschal, MD 400 mL/hr at 12/12/16 1557 4 mg at 12/12/16 1557    PHYSICAL EXAMINATION: ECOG PERFORMANCE STATUS: 1 - Symptomatic but completely ambulatory  Vitals:   12/12/16 1453  BP: (!) 157/65  Pulse: 77  Resp: 18  Temp: 97.9 F (36.6 C)  SpO2: 100%   Filed Weights   12/12/16 1453  Weight: 207 lb 14.4 oz (94.3 kg)    GENERAL:alert, no distress and comfortable SKIN: skin color, texture,  turgor are normal, no rashes or significant lesions EYES: normal, Conjunctiva are pink and non-injected, sclera clear OROPHARYNX:no exudate, no erythema and lips, buccal mucosa, and tongue normal  NECK: supple, thyroid normal size, non-tender, without nodularity LYMPH:  no palpable lymphadenopathy in the cervical, axillary or inguinal LUNGS: clear to auscultation and percussion with normal breathing effort HEART: regular rate & rhythm and no murmurs and no lower extremity edema ABDOMEN:abdomen soft, non-tender and normal bowel sounds Musculoskeletal:no cyanosis of digits and no clubbing  NEURO: alert & oriented x 3 with fluent speech, no focal motor/sensory deficits  LABORATORY DATA:  I have reviewed the data as listed    Component Value Date/Time   NA 141 12/05/2016 1447   K 4.3 12/05/2016 1447   CL 107 03/15/2014 1741   CL 105 06/08/2012 0913   CO2 23 12/05/2016 1447   GLUCOSE 131 12/05/2016 1447   GLUCOSE 120 (H) 06/08/2012 0913   BUN 17.0 12/05/2016 1447   CREATININE 0.8 12/05/2016 1447   CALCIUM 9.1 12/05/2016 1447   PROT 6.8 12/05/2016 1447   ALBUMIN 3.7 12/05/2016 1447   AST 27 12/05/2016 1447   ALT 43 12/05/2016 1447   ALKPHOS 85 12/05/2016 1447   BILITOT 0.68 12/05/2016 1447   GFRNONAA 87 (L) 10/10/2013 0726   GFRAA >90 10/10/2013 0726    No results found for: SPEP, UPEP  Lab Results   Component Value Date   WBC 3.6 (L) 12/05/2016   NEUTROABS 2.1 12/05/2016   HGB 11.7 12/05/2016   HCT 36.2 12/05/2016   MCV 97.6 12/05/2016   PLT 162 12/05/2016      Chemistry      Component Value Date/Time   NA 141 12/05/2016 1447   K 4.3 12/05/2016 1447   CL 107 03/15/2014 1741   CL 105 06/08/2012 0913   CO2 23 12/05/2016 1447   BUN 17.0 12/05/2016 1447   CREATININE 0.8 12/05/2016 1447      Component Value Date/Time   CALCIUM 9.1 12/05/2016 1447   ALKPHOS 85 12/05/2016 1447   AST 27 12/05/2016 1447   ALT 43 12/05/2016 1447   BILITOT 0.68 12/05/2016 1447      ASSESSMENT & PLAN:  BP (high blood pressure) she will continue current medical management. I recommend close follow-up with primary care doctor for medication adjustment.   Multiple myeloma in remission Her myeloma treatment has to be discontinued due to worsening neuropathy. We discussed the risks and benefits of discontinuation of treatment. She stopped Ninlaro in March 2017 Recent blood work showed that she continues to remain in remission I am also lengthening her Zometa treatment to every 6 months. She has no evidence of osteonecrosis of the jaw. She will continue calcium with vitamin D supplement. She agreed with the plan of care. I will see her back in 3 months for further assessment  Carcinoid tumor of left lung She is not symptomatic. Observe only. She will continue follow-up at [facility]  Leukopenia due to antineoplastic chemotherapy This is likely due to recent treatment. The patient denies recent history of fevers, cough, chills, diarrhea or dysuria. She is asymptomatic from the leukopenia. I will observe for now.    Orders Placed This Encounter  Procedures  . CBC with Differential/Platelet    Standing Status:   Future    Standing Expiration Date:   01/16/2018  . Comprehensive metabolic panel    Standing Status:   Future    Standing Expiration Date:   01/16/2018  . Kappa/lambda light  chains    Standing Status:   Future    Standing Expiration Date:   01/16/2018  . Multiple Myeloma Panel (SPEP&IFE w/QIG)    Standing Status:   Future    Standing Expiration Date:   01/16/2018   All questions were answered. The patient knows to call the clinic with any problems, questions or concerns. No barriers to learning was detected. I spent 15 minutes counseling the patient face to face. The total time spent in the appointment was 20 minutes and more than 50% was on counseling and review of test results     Heath Lark, MD 12/12/2016 4:00 PM

## 2016-12-31 DIAGNOSIS — N390 Urinary tract infection, site not specified: Secondary | ICD-10-CM | POA: Diagnosis not present

## 2016-12-31 DIAGNOSIS — R300 Dysuria: Secondary | ICD-10-CM | POA: Diagnosis not present

## 2017-02-13 DIAGNOSIS — R911 Solitary pulmonary nodule: Secondary | ICD-10-CM | POA: Diagnosis not present

## 2017-02-13 DIAGNOSIS — J929 Pleural plaque without asbestos: Secondary | ICD-10-CM | POA: Diagnosis not present

## 2017-02-13 DIAGNOSIS — Z85118 Personal history of other malignant neoplasm of bronchus and lung: Secondary | ICD-10-CM | POA: Diagnosis not present

## 2017-02-13 DIAGNOSIS — Z87891 Personal history of nicotine dependence: Secondary | ICD-10-CM | POA: Diagnosis not present

## 2017-02-13 DIAGNOSIS — I2510 Atherosclerotic heart disease of native coronary artery without angina pectoris: Secondary | ICD-10-CM | POA: Diagnosis not present

## 2017-02-13 DIAGNOSIS — J984 Other disorders of lung: Secondary | ICD-10-CM | POA: Diagnosis not present

## 2017-02-13 DIAGNOSIS — Z09 Encounter for follow-up examination after completed treatment for conditions other than malignant neoplasm: Secondary | ICD-10-CM | POA: Diagnosis not present

## 2017-02-13 DIAGNOSIS — Z902 Acquired absence of lung [part of]: Secondary | ICD-10-CM | POA: Diagnosis not present

## 2017-02-13 DIAGNOSIS — C3431 Malignant neoplasm of lower lobe, right bronchus or lung: Secondary | ICD-10-CM | POA: Diagnosis not present

## 2017-02-13 DIAGNOSIS — S299XXA Unspecified injury of thorax, initial encounter: Secondary | ICD-10-CM | POA: Diagnosis not present

## 2017-02-20 DIAGNOSIS — Z23 Encounter for immunization: Secondary | ICD-10-CM | POA: Diagnosis not present

## 2017-03-03 ENCOUNTER — Telehealth: Payer: Self-pay

## 2017-03-03 NOTE — Telephone Encounter (Signed)
Called and rescheduled appointment Per MD. Approval. Per 2/5 voice mail respond.

## 2017-03-06 ENCOUNTER — Other Ambulatory Visit: Payer: Medicare Other

## 2017-03-06 DIAGNOSIS — C9001 Multiple myeloma in remission: Secondary | ICD-10-CM | POA: Diagnosis not present

## 2017-03-06 DIAGNOSIS — G629 Polyneuropathy, unspecified: Secondary | ICD-10-CM | POA: Diagnosis not present

## 2017-03-06 DIAGNOSIS — Z85118 Personal history of other malignant neoplasm of bronchus and lung: Secondary | ICD-10-CM | POA: Diagnosis not present

## 2017-03-06 DIAGNOSIS — Z9484 Stem cells transplant status: Secondary | ICD-10-CM | POA: Diagnosis not present

## 2017-03-06 DIAGNOSIS — M8580 Other specified disorders of bone density and structure, unspecified site: Secondary | ICD-10-CM | POA: Diagnosis not present

## 2017-03-06 DIAGNOSIS — Z79899 Other long term (current) drug therapy: Secondary | ICD-10-CM | POA: Diagnosis not present

## 2017-03-09 ENCOUNTER — Inpatient Hospital Stay: Payer: Medicare Other | Attending: Hematology and Oncology

## 2017-03-09 DIAGNOSIS — Z9221 Personal history of antineoplastic chemotherapy: Secondary | ICD-10-CM | POA: Diagnosis not present

## 2017-03-09 DIAGNOSIS — Z9484 Stem cells transplant status: Secondary | ICD-10-CM | POA: Diagnosis not present

## 2017-03-09 DIAGNOSIS — C9001 Multiple myeloma in remission: Secondary | ICD-10-CM | POA: Insufficient documentation

## 2017-03-09 DIAGNOSIS — C7A090 Malignant carcinoid tumor of the bronchus and lung: Secondary | ICD-10-CM | POA: Insufficient documentation

## 2017-03-09 DIAGNOSIS — E669 Obesity, unspecified: Secondary | ICD-10-CM | POA: Insufficient documentation

## 2017-03-09 DIAGNOSIS — Z7984 Long term (current) use of oral hypoglycemic drugs: Secondary | ICD-10-CM | POA: Diagnosis not present

## 2017-03-09 DIAGNOSIS — G620 Drug-induced polyneuropathy: Secondary | ICD-10-CM | POA: Diagnosis not present

## 2017-03-09 DIAGNOSIS — Z79899 Other long term (current) drug therapy: Secondary | ICD-10-CM | POA: Diagnosis not present

## 2017-03-09 LAB — COMPREHENSIVE METABOLIC PANEL
ALBUMIN: 3.8 g/dL (ref 3.5–5.0)
ALK PHOS: 80 U/L (ref 40–150)
ALT: 52 U/L (ref 0–55)
ANION GAP: 10 (ref 3–11)
AST: 35 U/L — ABNORMAL HIGH (ref 5–34)
BUN: 21 mg/dL (ref 7–26)
CALCIUM: 9.6 mg/dL (ref 8.4–10.4)
CO2: 25 mmol/L (ref 22–29)
Chloride: 107 mmol/L (ref 98–109)
Creatinine, Ser: 0.96 mg/dL (ref 0.60–1.10)
GFR calc non Af Amer: 59 mL/min — ABNORMAL LOW (ref >60)
Glucose, Bld: 125 mg/dL (ref 70–140)
POTASSIUM: 4.5 mmol/L (ref 3.5–5.1)
SODIUM: 142 mmol/L (ref 136–145)
TOTAL PROTEIN: 6.9 g/dL (ref 6.4–8.3)
Total Bilirubin: 0.5 mg/dL (ref 0.2–1.2)

## 2017-03-09 LAB — CBC WITH DIFFERENTIAL/PLATELET
BASOS ABS: 0 10*3/uL (ref 0.0–0.1)
BASOS PCT: 1 %
Eosinophils Absolute: 0.2 10*3/uL (ref 0.0–0.5)
Eosinophils Relative: 5 %
HCT: 36.2 % (ref 34.8–46.6)
HEMOGLOBIN: 11.9 g/dL (ref 11.6–15.9)
LYMPHS PCT: 34 %
Lymphs Abs: 1.6 10*3/uL (ref 0.9–3.3)
MCH: 32.4 pg (ref 25.1–34.0)
MCHC: 33 g/dL (ref 31.5–36.0)
MCV: 98 fL (ref 79.5–101.0)
Monocytes Absolute: 0.3 10*3/uL (ref 0.1–0.9)
Monocytes Relative: 7 %
NEUTROS PCT: 53 %
Neutro Abs: 2.5 10*3/uL (ref 1.5–6.5)
Platelets: 173 10*3/uL (ref 145–400)
RBC: 3.69 MIL/uL — ABNORMAL LOW (ref 3.70–5.45)
RDW: 14.8 % — ABNORMAL HIGH (ref 11.2–14.5)
WBC: 4.7 10*3/uL (ref 3.9–10.3)

## 2017-03-10 LAB — KAPPA/LAMBDA LIGHT CHAINS
KAPPA, LAMDA LIGHT CHAIN RATIO: 1.28 (ref 0.26–1.65)
Kappa free light chain: 14.6 mg/L (ref 3.3–19.4)
LAMDA FREE LIGHT CHAINS: 11.4 mg/L (ref 5.7–26.3)

## 2017-03-12 LAB — MULTIPLE MYELOMA PANEL, SERUM
ALBUMIN SERPL ELPH-MCNC: 3.8 g/dL (ref 2.9–4.4)
Albumin/Glob SerPl: 1.4 (ref 0.7–1.7)
Alpha 1: 0.2 g/dL (ref 0.0–0.4)
Alpha2 Glob SerPl Elph-Mcnc: 0.6 g/dL (ref 0.4–1.0)
B-GLOBULIN SERPL ELPH-MCNC: 1.2 g/dL (ref 0.7–1.3)
Gamma Glob SerPl Elph-Mcnc: 0.8 g/dL (ref 0.4–1.8)
Globulin, Total: 2.8 g/dL (ref 2.2–3.9)
IGG (IMMUNOGLOBIN G), SERUM: 929 mg/dL (ref 700–1600)
IgA: 130 mg/dL (ref 87–352)
IgM (Immunoglobulin M), Srm: 35 mg/dL (ref 26–217)
TOTAL PROTEIN ELP: 6.6 g/dL (ref 6.0–8.5)

## 2017-03-13 ENCOUNTER — Telehealth: Payer: Self-pay | Admitting: Hematology and Oncology

## 2017-03-13 ENCOUNTER — Encounter: Payer: Self-pay | Admitting: Hematology and Oncology

## 2017-03-13 ENCOUNTER — Inpatient Hospital Stay (HOSPITAL_BASED_OUTPATIENT_CLINIC_OR_DEPARTMENT_OTHER): Payer: Medicare Other | Admitting: Hematology and Oncology

## 2017-03-13 DIAGNOSIS — T451X5A Adverse effect of antineoplastic and immunosuppressive drugs, initial encounter: Secondary | ICD-10-CM

## 2017-03-13 DIAGNOSIS — Z7984 Long term (current) use of oral hypoglycemic drugs: Secondary | ICD-10-CM

## 2017-03-13 DIAGNOSIS — C7A090 Malignant carcinoid tumor of the bronchus and lung: Secondary | ICD-10-CM | POA: Diagnosis not present

## 2017-03-13 DIAGNOSIS — G620 Drug-induced polyneuropathy: Secondary | ICD-10-CM | POA: Diagnosis not present

## 2017-03-13 DIAGNOSIS — C9001 Multiple myeloma in remission: Secondary | ICD-10-CM | POA: Diagnosis not present

## 2017-03-13 DIAGNOSIS — Z79899 Other long term (current) drug therapy: Secondary | ICD-10-CM | POA: Diagnosis not present

## 2017-03-13 DIAGNOSIS — D3A090 Benign carcinoid tumor of the bronchus and lung: Secondary | ICD-10-CM

## 2017-03-13 DIAGNOSIS — E669 Obesity, unspecified: Secondary | ICD-10-CM | POA: Diagnosis not present

## 2017-03-13 DIAGNOSIS — Z9221 Personal history of antineoplastic chemotherapy: Secondary | ICD-10-CM

## 2017-03-13 DIAGNOSIS — Z9484 Stem cells transplant status: Secondary | ICD-10-CM

## 2017-03-13 NOTE — Assessment & Plan Note (Signed)
She will return to [facility] for her follow-up after autologous stem cell transplant.

## 2017-03-13 NOTE — Telephone Encounter (Signed)
Gave patient AVS and calendar of upcoming May appointments.

## 2017-03-13 NOTE — Assessment & Plan Note (Signed)
Her myeloma treatment has to be discontinued due to worsening neuropathy. We discussed the risks and benefits of discontinuation of treatment. She stopped Ninlaro in March 2017 Recent blood work showed that she continues to remain in remission We have stopped Zometa after numerous doses. She will continue calcium with vitamin D supplement. She agreed with the plan of care. I will see her back in 3 months for further assessment  We discussed vaccination program.  She is allowed to get shingles vaccine (Shingrix) but unfortunately, it is not stocked here. I recommend she contact her pharmacist or primary care doctor to see if she can get Shingrix from the other facilities.

## 2017-03-13 NOTE — Assessment & Plan Note (Signed)
This is improving/stable since discontinuation of treatment. Continue close observation

## 2017-03-13 NOTE — Progress Notes (Signed)
[place] OFFICE PROGRESS NOTE  Patient Care Team: Maurice Small, MD as PCP - General (Family Medicine) Laurence Spates, MD as Attending Physician (Gastroenterology) Jerrell Belfast, MD as Attending Physician (Otolaryngology) Collene Gobble, MD as Attending Physician (Pulmonary Disease) [place], Glorianne Manchester, DO as Consulting Physician (Hematology)  ASSESSMENT & PLAN:  Multiple myeloma in remission Her myeloma treatment has to be discontinued due to worsening neuropathy. We discussed the risks and benefits of discontinuation of treatment. She stopped Ninlaro in March 2017 Recent blood work showed that she continues to remain in remission We have stopped Zometa after numerous doses. She will continue calcium with vitamin D supplement. She agreed with the plan of care. I will see her back in 3 months for further assessment  We discussed vaccination program.  She is allowed to get shingles vaccine (Shingrix) but unfortunately, it is not stocked here. I recommend she contact her pharmacist or primary care doctor to see if she can get Shingrix from the other facilities.  H/O autologous stem cell transplant She will return to [facility], [place] for her follow-up after autologous stem cell transplant.  Neuropathy due to chemotherapeutic drug This is improving/stable since discontinuation of treatment. Continue close observation  Carcinoid tumor of left lung She is not symptomatic. Observe only. She will continue follow-up at [facility]  Obesity (BMI 30.0-34.9) She is obese with cardiovascular risk factors We discussed dietary modification and exercise   Orders Placed This Encounter  Procedures  . Comprehensive metabolic panel    Standing Status:   Future    Standing Expiration Date:   04/17/2018  . CBC with Differential/Platelet    Standing Status:   Future    Standing Expiration Date:   04/17/2018  . Kappa/lambda light chains    Standing Status:   Future   Standing Expiration Date:   04/17/2018  . Multiple Myeloma Panel (SPEP&IFE w/QIG)    Standing Status:   Future    Standing Expiration Date:   04/17/2018    INTERVAL HISTORY: Please see below for problem oriented charting. She returns for further follow-up She has recent follow-up at [facility] She denies recent infection No recent cough She denies new bone pain.  SUMMARY OF ONCOLOGIC HISTORY:   Multiple myeloma in remission ([place])   05/27/2012 Initial Diagnosis    Multiple myeloma, without mention of having achieved remission(203.00)      05/27/2012 Cancer Staging    1.  IgG kappa multiple myeloma. She presented in 05/2012 with anemia, Cr 0.8, Ca 9.6; no bone lytic lesion; LDH 140; beta2- microglobulin 1.86 (ref 1.01-1.73). Positive SPEP for M-spike at 1.69, IgG 2080, kappa 7.24, kappa:lambda ratio 24.97; UPEP positive      06/08/2012 Bone Marrow Biopsy    Bone marrow biopsy on 06/08/12 showed 19% plasma cell; normal cytogenetics; myeloma FISH pending.       06/14/2012 Imaging    Skeletal survey negative for bone lytic lesions.  Lung nodule apparent.      09/17/2012 Bone Marrow Biopsy    Bone marrow biopsy showed 11% plasma cells.      10/18/2012 - 12/31/2012 Chemotherapy    Started on VRD (Velcade 1.3 mg/m^2 [place] days 1,8 and 15; Revlimide 25 mg PO daily, on day 1 through 14; Decadron 40 mg PO Days 1, 8, 15).   Started on coumadin given RD.      01/14/2013 Bone Marrow Biopsy    The bone marrow clot sections are normocellular toslightly hypercellular for age (  approximately 40% overall). 1% plasma cells.      02/08/2013 Bone Marrow Transplant    Auto Transplant at [facility] (Dr. Glorianne Manchester McIver).      11/30/2013 - 08/23/2014 Chemotherapy    Velcade maintence therapy to start today q 2 weeks, discontinued due to neuropathy      03/15/2014 Imaging    CT scan of the chest excluded pulmonary emboli      07/26/2014 Adverse Reaction    Velcade dose is  reduced due to neuropathy      09/26/2014 - 02/23/2015 Chemotherapy    Treatment was switched to Ninlaro due to neuropathy      02/23/2015 Adverse Reaction    Ninlaro was stopped due to worsening neuropathy      10/30/2016 Imaging    The BMD measured at Femur Neck Left is 0.763 g/cm2 with a T-score of -2.0. 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 Lamonte Sakai)      07/08/2012 Pathology Results    Pathology consistent with Non-small cell lung cancer of RLL.       07/14/2012 Imaging    1. Right lower lobe nodule is non hypermetabolic which suggests benign etiology considering the slow growing rate in 6 years or non FDG avid disease as seen with carcinoid tumors. Pending biopsy. No PET evidence of mediastinal nodal disease.       08/02/2012 Imaging    MRI of brain negative.      08/12/2012 Surgery    Robotic Lobectomy of R Lower lobe.       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     Surgical resection margins are negative.     Two benign lymph nodes.     pTa1N0       REVIEW OF SYSTEMS:   Constitutional: Denies fevers, chills or abnormal weight loss Eyes: Denies blurriness of vision Ears, nose, mouth, throat, and face: Denies mucositis or sore throat Respiratory: Denies cough, dyspnea or wheezes Cardiovascular: Denies palpitation, chest discomfort or lower extremity swelling Gastrointestinal:  Denies nausea, heartburn or change in bowel habits Skin: Denies abnormal skin rashes Lymphatics: Denies new lymphadenopathy or easy bruising Neurological:Denies numbness, tingling or new weaknesses Behavioral/Psych: Mood is stable, no new changes  All other systems were  reviewed with the patient and are negative.  I have reviewed the past medical history, past surgical history, social history and family history with the patient and they are unchanged from previous note.  ALLERGIES:  has No Known Allergies.  MEDICATIONS:  Current Outpatient Medications  Medication Sig Dispense Refill  . buPROPion (WELLBUTRIN XL) 300 MG 24 hr tablet Take 300 mg by mouth daily.    . cholecalciferol (VITAMIN D) 1000 UNITS tablet Take 2,000 Units by mouth daily.    Marland Kitchen losartan (COZAAR) 50 MG tablet Take 50 mg by mouth daily.    . magnesium chloride (SLOW-MAG) 64 MG TBEC SR tablet Take 1 tablet by mouth 2 (two) times daily.     . metFORMIN (GLUCOPHAGE) 500 MG tablet Take 1 tablet (500 mg total) by mouth 2 (two) times daily with a meal. 60 tablet 0  . metoprolol succinate (  TOPROL-XL) 50 MG 24 hr tablet Take 50 mg by mouth daily. Take with or immediately following a meal.    . Multiple Vitamins-Minerals (MULTIVITAMIN WITH MINERALS) tablet Take 1 tablet by mouth at bedtime.     . Multiple Vitamins-Minerals (PRESERVISION AREDS 2) CAPS Take 1 capsule by mouth 2 (two) times daily.    . ranitidine (ZANTAC) 150 MG capsule Take 150 mg by mouth daily.    . sertraline (ZOLOFT) 100 MG tablet Take 100 mg by mouth daily.     . simvastatin (ZOCOR) 40 MG tablet Take 40 mg by mouth at bedtime.      No current facility-administered medications for this visit.     PHYSICAL EXAMINATION: ECOG PERFORMANCE STATUS: 1 - Symptomatic but completely ambulatory  Vitals:   03/13/17 1501  BP: 140/69  Pulse: 80  Resp: 18  Temp: 97.8 F (36.6 C)  SpO2: 97%   Filed Weights   03/13/17 1501  Weight: 207 lb 6.4 oz (94.1 kg)    GENERAL:alert, no distress and comfortable SKIN: skin color, texture, turgor are normal, no rashes or significant lesions EYES: normal, Conjunctiva are pink and non-injected, sclera clear OROPHARYNX:no exudate, no erythema and lips, buccal mucosa, and tongue normal  NECK:  supple, thyroid normal size, non-tender, without nodularity LYMPH:  no palpable lymphadenopathy in the cervical, axillary or inguinal LUNGS: clear to auscultation and percussion with normal breathing effort HEART: regular rate & rhythm and no murmurs and no lower extremity edema ABDOMEN:abdomen soft, non-tender and normal bowel sounds Musculoskeletal:no cyanosis of digits and no clubbing  NEURO: alert & oriented x 3 with fluent speech, no focal motor/sensory deficits  LABORATORY DATA:  I have reviewed the data as listed    Component Value Date/Time   NA 142 03/09/2017 1524   NA 141 12/05/2016 1447   K 4.5 03/09/2017 1524   K 4.3 12/05/2016 1447   CL 107 03/09/2017 1524   CL 105 06/08/2012 0913   CO2 25 03/09/2017 1524   CO2 23 12/05/2016 1447   GLUCOSE 125 03/09/2017 1524   GLUCOSE 131 12/05/2016 1447   GLUCOSE 120 (H) 06/08/2012 0913   BUN 21 03/09/2017 1524   BUN 17.0 12/05/2016 1447   CREATININE 0.96 03/09/2017 1524   CREATININE 0.8 12/05/2016 1447   CALCIUM 9.6 03/09/2017 1524   CALCIUM 9.1 12/05/2016 1447   PROT 6.9 03/09/2017 1524   PROT 6.8 12/05/2016 1447   ALBUMIN 3.8 03/09/2017 1524   ALBUMIN 3.7 12/05/2016 1447   AST 35 (H) 03/09/2017 1524   AST 27 12/05/2016 1447   ALT 52 03/09/2017 1524   ALT 43 12/05/2016 1447   ALKPHOS 80 03/09/2017 1524   ALKPHOS 85 12/05/2016 1447   BILITOT 0.5 03/09/2017 1524   BILITOT 0.68 12/05/2016 1447   GFRNONAA 59 (L) 03/09/2017 1524   GFRAA >60 03/09/2017 1524    No results found for: SPEP, UPEP  Lab Results  Component Value Date   WBC 4.7 03/09/2017   NEUTROABS 2.5 03/09/2017   HGB 11.9 03/09/2017   HCT 36.2 03/09/2017   MCV 98.0 03/09/2017   PLT 173 03/09/2017      Chemistry      Component Value Date/Time   NA 142 03/09/2017 1524   NA 141 12/05/2016 1447   K 4.5 03/09/2017 1524   K 4.3 12/05/2016 1447   CL 107 03/09/2017 1524   CL 105 06/08/2012 0913   CO2 25 03/09/2017 1524   CO2 23 12/05/2016 1447   BUN  21 03/09/2017 1524   BUN 17.0 12/05/2016 1447   CREATININE 0.96 03/09/2017 1524   CREATININE 0.8 12/05/2016 1447      Component Value Date/Time   CALCIUM 9.6 03/09/2017 1524   CALCIUM 9.1 12/05/2016 1447   ALKPHOS 80 03/09/2017 1524   ALKPHOS 85 12/05/2016 1447   AST 35 (H) 03/09/2017 1524   AST 27 12/05/2016 1447   ALT 52 03/09/2017 1524   ALT 43 12/05/2016 1447   BILITOT 0.5 03/09/2017 1524   BILITOT 0.68 12/05/2016 1447      All questions were answered. The patient knows to call the clinic with any problems, questions or concerns. No barriers to learning was detected.  I spent 15 minutes counseling the patient face to face. The total time spent in the appointment was 20 minutes and more than 50% was on counseling and review of test results  Heath Lark, MD 03/13/2017 3:27 PM

## 2017-03-13 NOTE — Assessment & Plan Note (Signed)
She is not symptomatic. Observe only. She will continue follow-up at [facility]

## 2017-03-13 NOTE — Assessment & Plan Note (Signed)
She is obese with cardiovascular risk factors We discussed dietary modification and exercise

## 2017-05-13 DIAGNOSIS — J019 Acute sinusitis, unspecified: Secondary | ICD-10-CM | POA: Diagnosis not present

## 2017-05-27 DIAGNOSIS — Z0000 Encounter for general adult medical examination without abnormal findings: Secondary | ICD-10-CM | POA: Diagnosis not present

## 2017-06-05 ENCOUNTER — Inpatient Hospital Stay: Payer: Medicare Other | Attending: Hematology and Oncology

## 2017-06-05 DIAGNOSIS — C9001 Multiple myeloma in remission: Secondary | ICD-10-CM | POA: Diagnosis not present

## 2017-06-05 DIAGNOSIS — Z9221 Personal history of antineoplastic chemotherapy: Secondary | ICD-10-CM | POA: Diagnosis not present

## 2017-06-05 DIAGNOSIS — G629 Polyneuropathy, unspecified: Secondary | ICD-10-CM | POA: Diagnosis not present

## 2017-06-05 DIAGNOSIS — Z79899 Other long term (current) drug therapy: Secondary | ICD-10-CM | POA: Diagnosis not present

## 2017-06-05 DIAGNOSIS — E1165 Type 2 diabetes mellitus with hyperglycemia: Secondary | ICD-10-CM | POA: Insufficient documentation

## 2017-06-05 DIAGNOSIS — N183 Chronic kidney disease, stage 3 (moderate): Secondary | ICD-10-CM | POA: Diagnosis not present

## 2017-06-05 DIAGNOSIS — Z7984 Long term (current) use of oral hypoglycemic drugs: Secondary | ICD-10-CM | POA: Insufficient documentation

## 2017-06-05 DIAGNOSIS — R918 Other nonspecific abnormal finding of lung field: Secondary | ICD-10-CM | POA: Diagnosis not present

## 2017-06-05 LAB — CBC WITH DIFFERENTIAL/PLATELET
BASOS ABS: 0 10*3/uL (ref 0.0–0.1)
BASOS PCT: 0 %
Eosinophils Absolute: 0.1 10*3/uL (ref 0.0–0.5)
Eosinophils Relative: 4 %
HCT: 35.3 % (ref 34.8–46.6)
Hemoglobin: 11.5 g/dL — ABNORMAL LOW (ref 11.6–15.9)
Lymphocytes Relative: 30 %
Lymphs Abs: 1 10*3/uL (ref 0.9–3.3)
MCH: 32 pg (ref 25.1–34.0)
MCHC: 32.7 g/dL (ref 31.5–36.0)
MCV: 97.8 fL (ref 79.5–101.0)
MONO ABS: 0.2 10*3/uL (ref 0.1–0.9)
Monocytes Relative: 7 %
Neutro Abs: 2 10*3/uL (ref 1.5–6.5)
Neutrophils Relative %: 59 %
Platelets: 142 10*3/uL — ABNORMAL LOW (ref 145–400)
RBC: 3.61 MIL/uL — ABNORMAL LOW (ref 3.70–5.45)
RDW: 15.6 % — AB (ref 11.2–14.5)
WBC: 3.3 10*3/uL — ABNORMAL LOW (ref 3.9–10.3)

## 2017-06-05 LAB — COMPREHENSIVE METABOLIC PANEL
ALBUMIN: 3.7 g/dL (ref 3.5–5.0)
ALT: 53 U/L (ref 0–55)
ANION GAP: 8 (ref 3–11)
AST: 28 U/L (ref 5–34)
Alkaline Phosphatase: 86 U/L (ref 40–150)
BUN: 22 mg/dL (ref 7–26)
CHLORIDE: 106 mmol/L (ref 98–109)
CO2: 26 mmol/L (ref 22–29)
Calcium: 9.5 mg/dL (ref 8.4–10.4)
Creatinine, Ser: 1.01 mg/dL (ref 0.60–1.10)
GFR calc Af Amer: >60 mL/min (ref >60)
GFR, EST NON AFRICAN AMERICAN: 55 mL/min — AB (ref >60)
Glucose, Bld: 227 mg/dL — ABNORMAL HIGH (ref 70–140)
Potassium: 4.2 mmol/L (ref 3.5–5.1)
Sodium: 140 mmol/L (ref 136–145)
Total Bilirubin: 0.7 mg/dL (ref 0.2–1.2)
Total Protein: 6.9 g/dL (ref 6.4–8.3)

## 2017-06-08 LAB — KAPPA/LAMBDA LIGHT CHAINS
KAPPA FREE LGHT CHN: 19.9 mg/L — AB (ref 3.3–19.4)
KAPPA, LAMDA LIGHT CHAIN RATIO: 1.55 (ref 0.26–1.65)
LAMDA FREE LIGHT CHAINS: 12.8 mg/L (ref 5.7–26.3)

## 2017-06-09 LAB — MULTIPLE MYELOMA PANEL, SERUM
ALBUMIN SERPL ELPH-MCNC: 3.5 g/dL (ref 2.9–4.4)
Albumin/Glob SerPl: 1.3 (ref 0.7–1.7)
Alpha 1: 0.2 g/dL (ref 0.0–0.4)
Alpha2 Glob SerPl Elph-Mcnc: 0.7 g/dL (ref 0.4–1.0)
B-Globulin SerPl Elph-Mcnc: 1.1 g/dL (ref 0.7–1.3)
Gamma Glob SerPl Elph-Mcnc: 0.9 g/dL (ref 0.4–1.8)
Globulin, Total: 2.9 g/dL (ref 2.2–3.9)
IGG (IMMUNOGLOBIN G), SERUM: 984 mg/dL (ref 700–1600)
IGM (IMMUNOGLOBULIN M), SRM: 36 mg/dL (ref 26–217)
IgA: 144 mg/dL (ref 87–352)
TOTAL PROTEIN ELP: 6.4 g/dL (ref 6.0–8.5)

## 2017-06-12 ENCOUNTER — Inpatient Hospital Stay (HOSPITAL_BASED_OUTPATIENT_CLINIC_OR_DEPARTMENT_OTHER): Payer: Medicare Other | Admitting: Hematology and Oncology

## 2017-06-12 ENCOUNTER — Telehealth: Payer: Self-pay | Admitting: Hematology and Oncology

## 2017-06-12 ENCOUNTER — Encounter: Payer: Self-pay | Admitting: Hematology and Oncology

## 2017-06-12 VITALS — BP 136/67 | HR 77 | Temp 97.7°F | Resp 18 | Ht 65.0 in | Wt 207.2 lb

## 2017-06-12 DIAGNOSIS — E1165 Type 2 diabetes mellitus with hyperglycemia: Secondary | ICD-10-CM | POA: Diagnosis not present

## 2017-06-12 DIAGNOSIS — E119 Type 2 diabetes mellitus without complications: Secondary | ICD-10-CM

## 2017-06-12 DIAGNOSIS — R918 Other nonspecific abnormal finding of lung field: Secondary | ICD-10-CM | POA: Diagnosis not present

## 2017-06-12 DIAGNOSIS — G629 Polyneuropathy, unspecified: Secondary | ICD-10-CM

## 2017-06-12 DIAGNOSIS — D3A090 Benign carcinoid tumor of the bronchus and lung: Secondary | ICD-10-CM

## 2017-06-12 DIAGNOSIS — C9001 Multiple myeloma in remission: Secondary | ICD-10-CM

## 2017-06-12 DIAGNOSIS — N183 Chronic kidney disease, stage 3 unspecified: Secondary | ICD-10-CM

## 2017-06-12 DIAGNOSIS — Z9221 Personal history of antineoplastic chemotherapy: Secondary | ICD-10-CM

## 2017-06-12 DIAGNOSIS — Z7984 Long term (current) use of oral hypoglycemic drugs: Secondary | ICD-10-CM

## 2017-06-12 DIAGNOSIS — Z79899 Other long term (current) drug therapy: Secondary | ICD-10-CM

## 2017-06-12 NOTE — Assessment & Plan Note (Signed)
Her myeloma treatment has to be discontinued due to worsening neuropathy. We discussed the risks and benefits of discontinuation of treatment. She stopped Ninlaro in March 2017 Recent blood work showed that she continues to remain in remission We have stopped Zometa after numerous doses; planned for once a year only, next due in November 2019 She will continue calcium with vitamin D supplement. She agreed with the plan of care. I will see her back in 3 months for further assessment

## 2017-06-12 NOTE — Progress Notes (Signed)
[place] OFFICE PROGRESS NOTE  Patient Care Team: Maurice Small, MD as PCP - General (Family Medicine) Laurence Spates, MD as Attending Physician (Gastroenterology) Jerrell Belfast, MD as Attending Physician (Otolaryngology) Collene Gobble, MD as Attending Physician (Pulmonary Disease) [place], Glorianne Manchester, DO as Consulting Physician (Hematology)  ASSESSMENT & PLAN:  Multiple myeloma in remission Her myeloma treatment has to be discontinued due to worsening neuropathy. We discussed the risks and benefits of discontinuation of treatment. She stopped Ninlaro in March 2017 Recent blood work showed that she continues to remain in remission We have stopped Zometa after numerous doses; planned for once a year only, next due in November 2019 She will continue calcium with vitamin D supplement. She agreed with the plan of care. I will see her back in 3 months for further assessment   Type 2 diabetes mellitus without complications She has poorly controlled diabetes We discussed dietary modification and weight loss strategy She seems motivated to lose at least 7 pounds  Chronic kidney disease, stage III (moderate) she will continue current medical management. I recommend close follow-up with primary care doctor for medication adjustment. 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 Observe only. She will continue follow-up at [facility] This Encounter  Procedures  . Comprehensive metabolic panel    Standing Status:   Future    Standing Expiration Date:   07/17/2018  . CBC with Differential/Platelet    Standing Status:   Future    Standing Expiration Date:   07/17/2018  . Kappa/lambda light chains    Standing Status:   Future    Standing Expiration Date:   07/17/2018  . Multiple Myeloma Panel (SPEP&IFE w/QIG)    Standing Status:   Future    Standing Expiration Date:   07/17/2018    INTERVAL  HISTORY: Please see below for problem oriented charting. She returns for further follow-up She feels well No worsening peripheral neuropathy Denies recent cough, chest pain or shortness of breath No new bone pain She is concerned about her weight and diabetes  SUMMARY OF ONCOLOGIC HISTORY:   Multiple myeloma in remission ([place])   05/27/2012 Initial Diagnosis    Multiple myeloma, without mention of having achieved remission(203.00)      05/27/2012 Cancer Staging    1.  IgG kappa multiple myeloma. She presented in 05/2012 with anemia, Cr 0.8, Ca 9.6; no bone lytic lesion; LDH 140; beta2- microglobulin 1.86 (ref 1.01-1.73). Positive SPEP for M-spike at 1.69, IgG 2080, kappa 7.24, kappa:lambda ratio 24.97; UPEP positive      06/08/2012 Bone Marrow Biopsy    Bone marrow biopsy on 06/08/12 showed 19% plasma cell; normal cytogenetics; myeloma FISH pending.       06/14/2012 Imaging    Skeletal survey negative for bone lytic lesions.  Lung nodule apparent.      09/17/2012 Bone Marrow Biopsy    Bone marrow biopsy showed 11% plasma cells.      10/18/2012 - 12/31/2012 Chemotherapy    Started on VRD (Velcade 1.3 mg/m^2 [place] days 1,8 and 15; Revlimide 25 mg PO daily, on day 1 through 14; Decadron 40 mg PO Days 1, 8, 15).   Started on coumadin given RD.      01/14/2013 Bone Marrow Biopsy    The bone marrow clot sections are normocellular toslightly hypercellular for age (approximately 40% overall). 1% plasma cells.      02/08/2013 Bone Marrow Transplant  Auto Transplant at [facility] (Dr. Glorianne Manchester McIver).      11/30/2013 - 08/23/2014 Chemotherapy    Velcade maintence therapy to start today q 2 weeks, discontinued due to neuropathy      03/15/2014 Imaging    CT scan of the chest excluded pulmonary emboli      07/26/2014 Adverse Reaction    Velcade dose is reduced due to neuropathy      09/26/2014 - 02/23/2015 Chemotherapy    Treatment was switched to Ninlaro due to neuropathy       02/23/2015 Adverse Reaction    Ninlaro was stopped due to worsening neuropathy      10/30/2016 Imaging    The BMD measured at Femur Neck Left is 0.763 g/cm2 with a T-score of -2.0. 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 Lamonte Sakai)      07/08/2012 Pathology Results    Pathology consistent with Non-small cell lung cancer of RLL.       07/14/2012 Imaging    1. Right lower lobe nodule is non hypermetabolic which suggests benign etiology considering the slow growing rate in 6 years or non FDG avid disease as seen with carcinoid tumors. Pending biopsy. No PET evidence of mediastinal nodal disease.       08/02/2012 Imaging    MRI of brain negative.      08/12/2012 Surgery    Robotic Lobectomy of R Lower lobe.       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     Surgical resection margins are negative.     Two benign lymph nodes.     pTa1N0       REVIEW OF SYSTEMS:   Constitutional: Denies fevers, chills or abnormal weight loss Eyes: Denies blurriness of vision Ears, nose, mouth, throat, and face: Denies mucositis or sore throat Respiratory: Denies cough, dyspnea or wheezes Cardiovascular: Denies palpitation, chest discomfort or lower extremity swelling Gastrointestinal:  Denies nausea, heartburn or change in bowel habits Skin: Denies abnormal skin rashes Lymphatics: Denies new lymphadenopathy or easy bruising Neurological:Denies numbness, tingling or new weaknesses Behavioral/Psych: Mood is stable, no new changes  All other systems were reviewed with the patient and are negative.  I have reviewed the past medical history, past surgical history, social history and family  history with the patient and they are unchanged from previous note.  ALLERGIES:  has No Known Allergies.  MEDICATIONS:  Current Outpatient Medications  Medication Sig Dispense Refill  . buPROPion (WELLBUTRIN XL) 300 MG 24 hr tablet Take 300 mg by mouth daily.    . cholecalciferol (VITAMIN D) 1000 UNITS tablet Take 2,000 Units by mouth daily.    Marland Kitchen losartan (COZAAR) 50 MG tablet Take 50 mg by mouth daily.    . magnesium chloride (SLOW-MAG) 64 MG TBEC SR tablet Take 1 tablet by mouth 2 (two) times daily.     . metFORMIN (GLUCOPHAGE) 500 MG tablet Take 1 tablet (500 mg total) by mouth 2 (two) times daily with a meal. 60 tablet 0  . metoprolol succinate (TOPROL-XL) 50 MG 24 hr tablet Take 50 mg by mouth daily. Take with or immediately following a  meal.    . Multiple Vitamins-Minerals (MULTIVITAMIN WITH MINERALS) tablet Take 1 tablet by mouth at bedtime.     . Multiple Vitamins-Minerals (PRESERVISION AREDS 2) CAPS Take 1 capsule by mouth 2 (two) times daily.    . ranitidine (ZANTAC) 150 MG capsule Take 150 mg by mouth daily.    . sertraline (ZOLOFT) 100 MG tablet Take 100 mg by mouth daily.     . simvastatin (ZOCOR) 40 MG tablet Take 40 mg by mouth at bedtime.      No current facility-administered medications for this visit.     PHYSICAL EXAMINATION: ECOG PERFORMANCE STATUS: 1 - Symptomatic but completely ambulatory  Vitals:   06/12/17 1334  BP: 136/67  Pulse: 77  Resp: 18  Temp: 97.7 F (36.5 C)  SpO2: 100%   Filed Weights   06/12/17 1334  Weight: 207 lb 3.2 oz (94 kg)    GENERAL:alert, no distress and comfortable SKIN: skin color, texture, turgor are normal, no rashes or significant lesions EYES: normal, Conjunctiva are pink and non-injected, sclera clear OROPHARYNX:no exudate, no erythema and lips, buccal mucosa, and tongue normal  NECK: supple, thyroid normal size, non-tender, without nodularity LYMPH:  no palpable lymphadenopathy in the cervical, axillary or  inguinal LUNGS: clear to auscultation and percussion with normal breathing effort HEART: regular rate & rhythm and no murmurs and no lower extremity edema ABDOMEN:abdomen soft, non-tender and normal bowel sounds Musculoskeletal:no cyanosis of digits and no clubbing  NEURO: alert & oriented x 3 with fluent speech, no focal motor/sensory deficits  LABORATORY DATA:  I have reviewed the data as listed    Component Value Date/Time   NA 140 06/05/2017 1343   NA 141 12/05/2016 1447   K 4.2 06/05/2017 1343   K 4.3 12/05/2016 1447   CL 106 06/05/2017 1343   CL 105 06/08/2012 0913   CO2 26 06/05/2017 1343   CO2 23 12/05/2016 1447   GLUCOSE 227 (H) 06/05/2017 1343   GLUCOSE 131 12/05/2016 1447   GLUCOSE 120 (H) 06/08/2012 0913   BUN 22 06/05/2017 1343   BUN 17.0 12/05/2016 1447   CREATININE 1.01 06/05/2017 1343   CREATININE 0.8 12/05/2016 1447   CALCIUM 9.5 06/05/2017 1343   CALCIUM 9.1 12/05/2016 1447   PROT 6.9 06/05/2017 1343   PROT 6.8 12/05/2016 1447   ALBUMIN 3.7 06/05/2017 1343   ALBUMIN 3.7 12/05/2016 1447   AST 28 06/05/2017 1343   AST 27 12/05/2016 1447   ALT 53 06/05/2017 1343   ALT 43 12/05/2016 1447   ALKPHOS 86 06/05/2017 1343   ALKPHOS 85 12/05/2016 1447   BILITOT 0.7 06/05/2017 1343   BILITOT 0.68 12/05/2016 1447   GFRNONAA 55 (L) 06/05/2017 1343   GFRAA >60 06/05/2017 1343    No results found for: SPEP, UPEP  Lab Results  Component Value Date   WBC 3.3 (L) 06/05/2017   NEUTROABS 2.0 06/05/2017   HGB 11.5 (L) 06/05/2017   HCT 35.3 06/05/2017   MCV 97.8 06/05/2017   PLT 142 (L) 06/05/2017      Chemistry      Component Value Date/Time   NA 140 06/05/2017 1343   NA 141 12/05/2016 1447   K 4.2 06/05/2017 1343   K 4.3 12/05/2016 1447   CL 106 06/05/2017 1343   CL 105 06/08/2012 0913   CO2 26 06/05/2017 1343   CO2 23 12/05/2016 1447   BUN 22 06/05/2017 1343   BUN 17.0 12/05/2016 1447   CREATININE 1.01 06/05/2017 1343  CREATININE 0.8 12/05/2016  1447      Component Value Date/Time   CALCIUM 9.5 06/05/2017 1343   CALCIUM 9.1 12/05/2016 1447   ALKPHOS 86 06/05/2017 1343   ALKPHOS 85 12/05/2016 1447   AST 28 06/05/2017 1343   AST 27 12/05/2016 1447   ALT 53 06/05/2017 1343   ALT 43 12/05/2016 1447   BILITOT 0.7 06/05/2017 1343   BILITOT 0.68 12/05/2016 1447       All questions were answered. The patient knows to call the clinic with any problems, questions or concerns. No barriers to learning was detected.  I spent 15 minutes counseling the patient face to face. The total time spent in the appointment was 20 minutes and more than 50% was on counseling and review of test results  Heath Lark, MD 06/12/2017 2:25 PM

## 2017-06-12 NOTE — Assessment & Plan Note (Signed)
she will continue current medical management. I recommend close follow-up with primary care doctor for medication adjustment. Continue risk factors modification

## 2017-06-12 NOTE — Telephone Encounter (Signed)
Gave patient AVs and calendar of upcoming appointments.  °

## 2017-06-12 NOTE — Assessment & Plan Note (Signed)
She has poorly controlled diabetes We discussed dietary modification and weight loss strategy She seems motivated to lose at least 7 pounds

## 2017-06-12 NOTE — Assessment & Plan Note (Signed)
She is not symptomatic. Observe only. She will continue follow-up at [facility]

## 2017-07-08 DIAGNOSIS — N390 Urinary tract infection, site not specified: Secondary | ICD-10-CM | POA: Diagnosis not present

## 2017-08-10 ENCOUNTER — Encounter: Payer: Self-pay | Admitting: Hematology and Oncology

## 2017-08-10 DIAGNOSIS — K5730 Diverticulosis of large intestine without perforation or abscess without bleeding: Secondary | ICD-10-CM | POA: Diagnosis not present

## 2017-08-10 DIAGNOSIS — Z86010 Personal history of colonic polyps: Secondary | ICD-10-CM | POA: Diagnosis not present

## 2017-08-10 DIAGNOSIS — K640 First degree hemorrhoids: Secondary | ICD-10-CM | POA: Diagnosis not present

## 2017-08-19 ENCOUNTER — Telehealth: Payer: Self-pay | Admitting: Hematology and Oncology

## 2017-08-19 NOTE — Telephone Encounter (Signed)
Tried to reach regarding voicemail °

## 2017-09-10 ENCOUNTER — Other Ambulatory Visit: Payer: Medicare Other

## 2017-09-17 ENCOUNTER — Ambulatory Visit: Payer: Medicare Other | Admitting: Hematology and Oncology

## 2017-10-01 ENCOUNTER — Inpatient Hospital Stay: Payer: Medicare Other

## 2017-10-05 ENCOUNTER — Inpatient Hospital Stay: Payer: Medicare Other | Attending: Hematology and Oncology

## 2017-10-05 ENCOUNTER — Ambulatory Visit: Payer: Medicare Other | Admitting: Hematology and Oncology

## 2017-10-05 ENCOUNTER — Other Ambulatory Visit: Payer: Medicare Other

## 2017-10-05 DIAGNOSIS — C9001 Multiple myeloma in remission: Secondary | ICD-10-CM

## 2017-10-05 DIAGNOSIS — C9002 Multiple myeloma in relapse: Secondary | ICD-10-CM | POA: Insufficient documentation

## 2017-10-05 DIAGNOSIS — G629 Polyneuropathy, unspecified: Secondary | ICD-10-CM | POA: Diagnosis not present

## 2017-10-05 DIAGNOSIS — Z23 Encounter for immunization: Secondary | ICD-10-CM | POA: Insufficient documentation

## 2017-10-05 DIAGNOSIS — C7A090 Malignant carcinoid tumor of the bronchus and lung: Secondary | ICD-10-CM | POA: Insufficient documentation

## 2017-10-05 DIAGNOSIS — M8580 Other specified disorders of bone density and structure, unspecified site: Secondary | ICD-10-CM | POA: Insufficient documentation

## 2017-10-05 DIAGNOSIS — Z7984 Long term (current) use of oral hypoglycemic drugs: Secondary | ICD-10-CM | POA: Diagnosis not present

## 2017-10-05 DIAGNOSIS — D61818 Other pancytopenia: Secondary | ICD-10-CM | POA: Diagnosis not present

## 2017-10-05 DIAGNOSIS — Z79899 Other long term (current) drug therapy: Secondary | ICD-10-CM | POA: Diagnosis not present

## 2017-10-05 LAB — CBC WITH DIFFERENTIAL/PLATELET
BASOS ABS: 0 10*3/uL (ref 0.0–0.1)
BASOS PCT: 1 %
EOS ABS: 0.1 10*3/uL (ref 0.0–0.5)
Eosinophils Relative: 2 %
HCT: 36.5 % (ref 34.8–46.6)
HEMOGLOBIN: 11.7 g/dL (ref 11.6–15.9)
Lymphocytes Relative: 41 %
Lymphs Abs: 1.6 10*3/uL (ref 0.9–3.3)
MCH: 31.3 pg (ref 25.1–34.0)
MCHC: 32.1 g/dL (ref 31.5–36.0)
MCV: 97.6 fL (ref 79.5–101.0)
Monocytes Absolute: 0.2 10*3/uL (ref 0.1–0.9)
Monocytes Relative: 6 %
NEUTROS ABS: 1.9 10*3/uL (ref 1.5–6.5)
NEUTROS PCT: 50 %
Platelets: 174 10*3/uL (ref 145–400)
RBC: 3.74 MIL/uL (ref 3.70–5.45)
RDW: 13.8 % (ref 11.2–14.5)
WBC: 3.8 10*3/uL — ABNORMAL LOW (ref 3.9–10.3)

## 2017-10-05 LAB — COMPREHENSIVE METABOLIC PANEL
ALBUMIN: 3.7 g/dL (ref 3.5–5.0)
ALK PHOS: 103 U/L (ref 38–126)
ALT: 18 U/L (ref 0–44)
AST: 16 U/L (ref 15–41)
Anion gap: 10 (ref 5–15)
BILIRUBIN TOTAL: 0.5 mg/dL (ref 0.3–1.2)
BUN: 15 mg/dL (ref 8–23)
CO2: 27 mmol/L (ref 22–32)
Calcium: 10.1 mg/dL (ref 8.9–10.3)
Chloride: 106 mmol/L (ref 98–111)
Creatinine, Ser: 0.9 mg/dL (ref 0.44–1.00)
GFR calc Af Amer: >60 mL/min (ref >60)
GFR calc non Af Amer: >60 mL/min (ref >60)
GLUCOSE: 120 mg/dL — AB (ref 70–99)
POTASSIUM: 4.2 mmol/L (ref 3.5–5.1)
SODIUM: 143 mmol/L (ref 135–145)
TOTAL PROTEIN: 7.1 g/dL (ref 6.5–8.1)

## 2017-10-06 LAB — KAPPA/LAMBDA LIGHT CHAINS
Kappa free light chain: 18.4 mg/L (ref 3.3–19.4)
Kappa, lambda light chain ratio: 1.43 (ref 0.26–1.65)
Lambda free light chains: 12.9 mg/L (ref 5.7–26.3)

## 2017-10-06 LAB — MULTIPLE MYELOMA PANEL, SERUM
ALBUMIN SERPL ELPH-MCNC: 3.4 g/dL (ref 2.9–4.4)
ALBUMIN/GLOB SERPL: 1.3 (ref 0.7–1.7)
Alpha 1: 0.2 g/dL (ref 0.0–0.4)
Alpha2 Glob SerPl Elph-Mcnc: 0.8 g/dL (ref 0.4–1.0)
B-GLOBULIN SERPL ELPH-MCNC: 1 g/dL (ref 0.7–1.3)
GAMMA GLOB SERPL ELPH-MCNC: 0.8 g/dL (ref 0.4–1.8)
GLOBULIN, TOTAL: 2.8 g/dL (ref 2.2–3.9)
IGA: 156 mg/dL (ref 87–352)
IgG (Immunoglobin G), Serum: 1019 mg/dL (ref 700–1600)
IgM (Immunoglobulin M), Srm: 32 mg/dL (ref 26–217)
Total Protein ELP: 6.2 g/dL (ref 6.0–8.5)

## 2017-10-08 ENCOUNTER — Ambulatory Visit: Payer: Medicare Other | Admitting: Hematology and Oncology

## 2017-10-12 ENCOUNTER — Encounter: Payer: Self-pay | Admitting: Hematology and Oncology

## 2017-10-12 ENCOUNTER — Inpatient Hospital Stay (HOSPITAL_BASED_OUTPATIENT_CLINIC_OR_DEPARTMENT_OTHER): Payer: Medicare Other | Admitting: Hematology and Oncology

## 2017-10-12 ENCOUNTER — Telehealth: Payer: Self-pay | Admitting: Hematology and Oncology

## 2017-10-12 DIAGNOSIS — G629 Polyneuropathy, unspecified: Secondary | ICD-10-CM

## 2017-10-12 DIAGNOSIS — D3A090 Benign carcinoid tumor of the bronchus and lung: Secondary | ICD-10-CM

## 2017-10-12 DIAGNOSIS — Z23 Encounter for immunization: Secondary | ICD-10-CM | POA: Diagnosis not present

## 2017-10-12 DIAGNOSIS — D61818 Other pancytopenia: Secondary | ICD-10-CM

## 2017-10-12 DIAGNOSIS — C7A090 Malignant carcinoid tumor of the bronchus and lung: Secondary | ICD-10-CM | POA: Diagnosis not present

## 2017-10-12 DIAGNOSIS — M8580 Other specified disorders of bone density and structure, unspecified site: Secondary | ICD-10-CM | POA: Diagnosis not present

## 2017-10-12 DIAGNOSIS — C9002 Multiple myeloma in relapse: Secondary | ICD-10-CM

## 2017-10-12 DIAGNOSIS — C9001 Multiple myeloma in remission: Secondary | ICD-10-CM

## 2017-10-12 DIAGNOSIS — Z79899 Other long term (current) drug therapy: Secondary | ICD-10-CM

## 2017-10-12 DIAGNOSIS — Z7984 Long term (current) use of oral hypoglycemic drugs: Secondary | ICD-10-CM

## 2017-10-12 NOTE — Telephone Encounter (Signed)
Gave avs and calendar ° °

## 2017-10-13 ENCOUNTER — Encounter: Payer: Self-pay | Admitting: Hematology and Oncology

## 2017-10-13 NOTE — Progress Notes (Signed)
[place] OFFICE PROGRESS NOTE  Patient Care Team: Maurice Small, MD as PCP - General (Family Medicine) Laurence Spates, MD as Attending Physician (Gastroenterology) Jerrell Belfast, MD as Attending Physician (Otolaryngology) Collene Gobble, MD as Attending Physician (Pulmonary Disease) [place], Glorianne Manchester, DO as Consulting Physician (Hematology)  ASSESSMENT & PLAN:  Multiple myeloma in remission Her myeloma treatment has to be discontinued due to worsening neuropathy. We discussed the risks and benefits of discontinuation of treatment. She stopped Ninlaro in March 2017 Recent blood work showed that she continues to remain in remission We have stopped Zometa after numerous doses; recent bone density scan show mild osteopenia only. She will continue calcium with vitamin D supplement. She agreed with the plan of care. I will see her back in 3 months for further assessment   Carcinoid tumor of left lung She is not symptomatic. Observe only. 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 Comprehensive metabolic panel    Standing Status:   Future    Standing Expiration Date:   11/17/2018  . CBC with Differential/Platelet    Standing Status:   Future    Standing Expiration Date:   11/17/2018  . Multiple Myeloma Panel (SPEP&IFE w/QIG)    Standing Status:   Future    Standing Expiration Date:   11/17/2018  . Kappa/lambda light chains    Standing Status:   Future    Standing Expiration Date:   11/17/2018  . Beta 2 microglobulin, serum    Standing Status:   Future    Standing Expiration Date:   11/17/2018    INTERVAL HISTORY: Please see below for problem oriented charting. She returns for further follow-up She denies new bone pain No recent infection, fever or chills No  recent cough, chest pain or shortness of breath She remains active with all activities of daily living.  SUMMARY OF ONCOLOGIC HISTORY:   Multiple myeloma in remission ([place])   05/27/2012 Initial Diagnosis    Multiple myeloma, without mention of having achieved remission(203.00)    05/27/2012 Cancer Staging    1.  IgG kappa multiple myeloma. She presented in 05/2012 with anemia, Cr 0.8, Ca 9.6; no bone lytic lesion; LDH 140; beta2- microglobulin 1.86 (ref 1.01-1.73). Positive SPEP for M-spike at 1.69, IgG 2080, kappa 7.24, kappa:lambda ratio 24.97; UPEP positive    06/08/2012 Bone Marrow Biopsy    Bone marrow biopsy on 06/08/12 showed 19% plasma cell; normal cytogenetics; myeloma FISH pending.     06/14/2012 Imaging    Skeletal survey negative for bone lytic lesions.  Lung nodule apparent.    09/17/2012 Bone Marrow Biopsy    Bone marrow biopsy showed 11% plasma cells.    10/18/2012 - 12/31/2012 Chemotherapy    Started on VRD (Velcade 1.3 mg/m^2 [place] days 1,8 and 15; Revlimide 25 mg PO daily, on day 1 through 14; Decadron 40 mg PO Days 1, 8, 15).   Started on coumadin given RD.    01/14/2013 Bone Marrow Biopsy    The bone marrow clot sections are normocellular toslightly hypercellular for age (approximately 40% overall). 1% plasma cells.    02/08/2013 Bone Marrow Transplant    Auto Transplant at [facility] (Dr. Glorianne Manchester McIver).    11/30/2013 - 08/23/2014 Chemotherapy    Velcade maintence therapy to start today q 2 weeks, discontinued due to neuropathy  03/15/2014 Imaging    CT scan of the chest excluded pulmonary emboli    07/26/2014 Adverse Reaction    Velcade dose is reduced due to neuropathy    09/26/2014 - 02/23/2015 Chemotherapy    Treatment was switched to Ninlaro due to neuropathy    02/23/2015 Adverse Reaction    Ninlaro was stopped due to worsening neuropathy    10/30/2016 Imaging    The BMD measured at Femur Neck Left is 0.763 g/cm2 with a T-score of -2.0. 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 Lamonte Sakai)    07/08/2012 Pathology Results    Pathology consistent with Non-small cell lung cancer of RLL.     07/14/2012 Imaging    1. Right lower lobe nodule is non hypermetabolic which suggests benign etiology considering the slow growing rate in 6 years or non FDG avid disease as seen with carcinoid tumors. Pending biopsy. No PET evidence of mediastinal nodal disease.     08/02/2012 Imaging    MRI of brain negative.    08/12/2012 Surgery    Robotic Lobectomy of R Lower lobe.     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     Surgical resection margins are negative.     Two benign lymph nodes.     pTa1N0     REVIEW OF SYSTEMS:   Constitutional: Denies fevers, chills or abnormal weight loss Eyes: Denies blurriness of vision Ears, nose, mouth, throat, and face: Denies mucositis or sore throat Respiratory: Denies cough, dyspnea or wheezes Cardiovascular: Denies palpitation, chest discomfort or lower extremity swelling Gastrointestinal:  Denies nausea, heartburn or change in bowel habits Skin: Denies abnormal skin rashes Lymphatics: Denies new lymphadenopathy or easy bruising Neurological:Denies numbness, tingling or new weaknesses Behavioral/Psych: Mood is stable, no new changes  All other systems were reviewed with the patient and are negative.  I have reviewed the past medical history, past surgical history, social history and family history with the patient and they are unchanged from previous note.  ALLERGIES:  has No Known Allergies.  MEDICATIONS:  Current Outpatient Medications  Medication Sig Dispense Refill  . buPROPion (WELLBUTRIN XL) 300  MG 24 hr tablet Take 300 mg by mouth daily.    . cholecalciferol (VITAMIN D) 1000 UNITS tablet Take 2,000 Units by mouth daily.    Marland Kitchen losartan (COZAAR) 50 MG tablet Take 50 mg by mouth daily.    . magnesium chloride (SLOW-MAG) 64 MG TBEC SR tablet Take 1 tablet by mouth 2 (two) times daily.     . metFORMIN (GLUCOPHAGE) 500 MG tablet Take 1 tablet (500 mg total) by mouth 2 (two) times daily with a meal. 60 tablet 0  . metoprolol succinate (TOPROL-XL) 50 MG 24 hr tablet Take 50 mg by mouth daily. Take with or immediately following a meal.    . Multiple Vitamins-Minerals (MULTIVITAMIN WITH MINERALS) tablet Take 1 tablet by mouth at bedtime.     . Multiple Vitamins-Minerals (PRESERVISION AREDS 2) CAPS Take 1 capsule by mouth 2 (two) times daily.    . sertraline (ZOLOFT) 100 MG tablet Take 100 mg by mouth daily.     . simvastatin (ZOCOR) 40 MG  tablet Take 40 mg by mouth at bedtime.      No current facility-administered medications for this visit.     PHYSICAL EXAMINATION: ECOG PERFORMANCE STATUS: 1 - Symptomatic but completely ambulatory  Vitals:   10/12/17 0848  BP: (!) 123/54  Pulse: 71  Resp: 18  Temp: 98.4 F (36.9 C)  SpO2: 98%   Filed Weights   10/12/17 0848  Weight: 205 lb 12.8 oz (93.4 kg)    GENERAL:alert, no distress and comfortable SKIN: skin color, texture, turgor are normal, no rashes or significant lesions EYES: normal, Conjunctiva are pink and non-injected, sclera clear OROPHARYNX:no exudate, no erythema and lips, buccal mucosa, and tongue normal  NECK: supple, thyroid normal size, non-tender, without nodularity LYMPH:  no palpable lymphadenopathy in the cervical, axillary or inguinal LUNGS: clear to auscultation and percussion with normal breathing effort HEART: regular rate & rhythm and no murmurs and no lower extremity edema ABDOMEN:abdomen soft, non-tender and normal bowel sounds Musculoskeletal:no cyanosis of digits and no clubbing  NEURO: alert & oriented x 3  with fluent speech, no focal motor/sensory deficits  LABORATORY DATA:  I have reviewed the data as listed    Component Value Date/Time   NA 143 10/05/2017 1125   NA 141 12/05/2016 1447   K 4.2 10/05/2017 1125   K 4.3 12/05/2016 1447   CL 106 10/05/2017 1125   CL 105 06/08/2012 0913   CO2 27 10/05/2017 1125   CO2 23 12/05/2016 1447   GLUCOSE 120 (H) 10/05/2017 1125   GLUCOSE 131 12/05/2016 1447   GLUCOSE 120 (H) 06/08/2012 0913   BUN 15 10/05/2017 1125   BUN 17.0 12/05/2016 1447   CREATININE 0.90 10/05/2017 1125   CREATININE 0.8 12/05/2016 1447   CALCIUM 10.1 10/05/2017 1125   CALCIUM 9.1 12/05/2016 1447   PROT 7.1 10/05/2017 1125   PROT 6.8 12/05/2016 1447   ALBUMIN 3.7 10/05/2017 1125   ALBUMIN 3.7 12/05/2016 1447   AST 16 10/05/2017 1125   AST 27 12/05/2016 1447   ALT 18 10/05/2017 1125   ALT 43 12/05/2016 1447   ALKPHOS 103 10/05/2017 1125   ALKPHOS 85 12/05/2016 1447   BILITOT 0.5 10/05/2017 1125   BILITOT 0.68 12/05/2016 1447   GFRNONAA >60 10/05/2017 1125   GFRAA >60 10/05/2017 1125    No results found for: SPEP, UPEP  Lab Results  Component Value Date   WBC 3.8 (L) 10/05/2017   NEUTROABS 1.9 10/05/2017   HGB 11.7 10/05/2017   HCT 36.5 10/05/2017   MCV 97.6 10/05/2017   PLT 174 10/05/2017      Chemistry      Component Value Date/Time   NA 143 10/05/2017 1125   NA 141 12/05/2016 1447   K 4.2 10/05/2017 1125   K 4.3 12/05/2016 1447   CL 106 10/05/2017 1125   CL 105 06/08/2012 0913   CO2 27 10/05/2017 1125   CO2 23 12/05/2016 1447   BUN 15 10/05/2017 1125   BUN 17.0 12/05/2016 1447   CREATININE 0.90 10/05/2017 1125   CREATININE 0.8 12/05/2016 1447      Component Value Date/Time   CALCIUM 10.1 10/05/2017 1125   CALCIUM 9.1 12/05/2016 1447   ALKPHOS 103 10/05/2017 1125   ALKPHOS 85 12/05/2016 1447   AST 16 10/05/2017 1125   AST 27 12/05/2016 1447   ALT 18 10/05/2017 1125   ALT 43 12/05/2016 1447   BILITOT 0.5 10/05/2017 1125   BILITOT 0.68  12/05/2016 1447  All questions were answered. The patient knows to call the clinic with any problems, questions or concerns. No barriers to learning was detected.  I spent 15 minutes counseling the patient face to face. The total time spent in the appointment was 20 minutes and more than 50% was on counseling and review of test results  Heath Lark, MD 10/13/2017 10:11 AM

## 2017-10-13 NOTE — Assessment & Plan Note (Signed)
She is not symptomatic. Observe only. She will continue follow-up at [facility]

## 2017-10-13 NOTE — Assessment & Plan Note (Signed)
Her myeloma treatment has to be discontinued due to worsening neuropathy. We discussed the risks and benefits of discontinuation of treatment. She stopped Ninlaro in March 2017 Recent blood work showed that she continues to remain in remission We have stopped Zometa after numerous doses; recent bone density scan show mild osteopenia only. She will continue calcium with vitamin D supplement. She agreed with the plan of care. I will see her back in 3 months for further assessment

## 2017-10-13 NOTE — Assessment & Plan Note (Signed)
She has chronic, intermittent leukopenia She is not symptomatic Observe only I recommend influenza vaccination

## 2017-10-30 DIAGNOSIS — J069 Acute upper respiratory infection, unspecified: Secondary | ICD-10-CM | POA: Diagnosis not present

## 2017-11-11 DIAGNOSIS — H353132 Nonexudative age-related macular degeneration, bilateral, intermediate dry stage: Secondary | ICD-10-CM | POA: Diagnosis not present

## 2017-11-11 DIAGNOSIS — H35363 Drusen (degenerative) of macula, bilateral: Secondary | ICD-10-CM | POA: Diagnosis not present

## 2017-11-11 DIAGNOSIS — E119 Type 2 diabetes mellitus without complications: Secondary | ICD-10-CM | POA: Diagnosis not present

## 2017-12-10 DIAGNOSIS — Z23 Encounter for immunization: Secondary | ICD-10-CM | POA: Diagnosis not present

## 2018-01-01 ENCOUNTER — Telehealth: Payer: Self-pay

## 2018-01-01 NOTE — Telephone Encounter (Signed)
Received transfer message from scheduling to cancel 12/9 appts. She will call after Christmas and reschedule appts.

## 2018-01-04 ENCOUNTER — Inpatient Hospital Stay: Payer: Medicare Other

## 2018-01-11 ENCOUNTER — Ambulatory Visit: Payer: Medicare Other | Admitting: Hematology and Oncology

## 2018-02-02 DIAGNOSIS — H6121 Impacted cerumen, right ear: Secondary | ICD-10-CM | POA: Diagnosis not present

## 2018-02-08 ENCOUNTER — Telehealth: Payer: Self-pay | Admitting: Hematology and Oncology

## 2018-02-08 NOTE — Telephone Encounter (Signed)
Per 01/03 voicemail scheduling, rescheduled patient lab and doc visit. She stated the labs suppose to be a week prior before the doc visit.  Patient is aware of the new scheduled appointments.

## 2018-02-09 ENCOUNTER — Inpatient Hospital Stay: Payer: Medicare Other | Attending: Hematology and Oncology

## 2018-02-09 DIAGNOSIS — M8580 Other specified disorders of bone density and structure, unspecified site: Secondary | ICD-10-CM | POA: Diagnosis not present

## 2018-02-09 DIAGNOSIS — Z7984 Long term (current) use of oral hypoglycemic drugs: Secondary | ICD-10-CM | POA: Insufficient documentation

## 2018-02-09 DIAGNOSIS — Z9221 Personal history of antineoplastic chemotherapy: Secondary | ICD-10-CM | POA: Insufficient documentation

## 2018-02-09 DIAGNOSIS — Z79899 Other long term (current) drug therapy: Secondary | ICD-10-CM | POA: Diagnosis not present

## 2018-02-09 DIAGNOSIS — C9001 Multiple myeloma in remission: Secondary | ICD-10-CM | POA: Diagnosis not present

## 2018-02-09 DIAGNOSIS — C7A090 Malignant carcinoid tumor of the bronchus and lung: Secondary | ICD-10-CM | POA: Diagnosis not present

## 2018-02-09 DIAGNOSIS — G629 Polyneuropathy, unspecified: Secondary | ICD-10-CM | POA: Insufficient documentation

## 2018-02-09 LAB — COMPREHENSIVE METABOLIC PANEL
ALT: 25 U/L (ref 0–44)
AST: 26 U/L (ref 15–41)
Albumin: 3.3 g/dL — ABNORMAL LOW (ref 3.5–5.0)
Alkaline Phosphatase: 142 U/L — ABNORMAL HIGH (ref 38–126)
Anion gap: 10 (ref 5–15)
BUN: 13 mg/dL (ref 8–23)
CO2: 24 mmol/L (ref 22–32)
Calcium: 9.4 mg/dL (ref 8.9–10.3)
Chloride: 107 mmol/L (ref 98–111)
Creatinine, Ser: 0.78 mg/dL (ref 0.44–1.00)
GFR calc Af Amer: >60 mL/min (ref >60)
GFR calc non Af Amer: >60 mL/min (ref >60)
Glucose, Bld: 131 mg/dL — ABNORMAL HIGH (ref 70–99)
Potassium: 4.6 mmol/L (ref 3.5–5.1)
SODIUM: 141 mmol/L (ref 135–145)
Total Bilirubin: 0.5 mg/dL (ref 0.3–1.2)
Total Protein: 7.2 g/dL (ref 6.5–8.1)

## 2018-02-09 LAB — CBC WITH DIFFERENTIAL/PLATELET
Abs Immature Granulocytes: 0.01 10*3/uL (ref 0.00–0.07)
Basophils Absolute: 0 10*3/uL (ref 0.0–0.1)
Basophils Relative: 0 %
Eosinophils Absolute: 0.2 10*3/uL (ref 0.0–0.5)
Eosinophils Relative: 4 %
HCT: 36 % (ref 36.0–46.0)
Hemoglobin: 11.4 g/dL — ABNORMAL LOW (ref 12.0–15.0)
IMMATURE GRANULOCYTES: 0 %
Lymphocytes Relative: 23 %
Lymphs Abs: 1.1 10*3/uL (ref 0.7–4.0)
MCH: 30.3 pg (ref 26.0–34.0)
MCHC: 31.7 g/dL (ref 30.0–36.0)
MCV: 95.7 fL (ref 80.0–100.0)
Monocytes Absolute: 0.4 10*3/uL (ref 0.1–1.0)
Monocytes Relative: 9 %
NRBC: 0 % (ref 0.0–0.2)
Neutro Abs: 3 10*3/uL (ref 1.7–7.7)
Neutrophils Relative %: 64 %
Platelets: 189 10*3/uL (ref 150–400)
RBC: 3.76 MIL/uL — AB (ref 3.87–5.11)
RDW: 13.4 % (ref 11.5–15.5)
WBC: 4.7 10*3/uL (ref 4.0–10.5)

## 2018-02-10 LAB — KAPPA/LAMBDA LIGHT CHAINS
KAPPA, LAMDA LIGHT CHAIN RATIO: 0.98 (ref 0.26–1.65)
Kappa free light chain: 20.1 mg/L — ABNORMAL HIGH (ref 3.3–19.4)
LAMDA FREE LIGHT CHAINS: 20.5 mg/L (ref 5.7–26.3)

## 2018-02-10 LAB — BETA 2 MICROGLOBULIN, SERUM: Beta-2 Microglobulin: 2.7 mg/L — ABNORMAL HIGH (ref 0.6–2.4)

## 2018-02-11 ENCOUNTER — Other Ambulatory Visit: Payer: Medicare Other

## 2018-02-11 ENCOUNTER — Ambulatory Visit: Payer: Medicare Other | Admitting: Hematology and Oncology

## 2018-02-11 LAB — MULTIPLE MYELOMA PANEL, SERUM
Albumin SerPl Elph-Mcnc: 3.5 g/dL (ref 2.9–4.4)
Albumin/Glob SerPl: 1.2 (ref 0.7–1.7)
Alpha 1: 0.3 g/dL (ref 0.0–0.4)
Alpha2 Glob SerPl Elph-Mcnc: 0.8 g/dL (ref 0.4–1.0)
B-Globulin SerPl Elph-Mcnc: 1.1 g/dL (ref 0.7–1.3)
Gamma Glob SerPl Elph-Mcnc: 1 g/dL (ref 0.4–1.8)
Globulin, Total: 3.1 g/dL (ref 2.2–3.9)
IGA: 200 mg/dL (ref 64–422)
IGM (IMMUNOGLOBULIN M), SRM: 42 mg/dL (ref 26–217)
IgG (Immunoglobin G), Serum: 1110 mg/dL (ref 700–1600)
Total Protein ELP: 6.6 g/dL (ref 6.0–8.5)

## 2018-02-16 ENCOUNTER — Telehealth: Payer: Self-pay | Admitting: Hematology and Oncology

## 2018-02-16 ENCOUNTER — Encounter: Payer: Self-pay | Admitting: Hematology and Oncology

## 2018-02-16 ENCOUNTER — Inpatient Hospital Stay (HOSPITAL_BASED_OUTPATIENT_CLINIC_OR_DEPARTMENT_OTHER): Payer: Medicare Other | Admitting: Hematology and Oncology

## 2018-02-16 DIAGNOSIS — M8580 Other specified disorders of bone density and structure, unspecified site: Secondary | ICD-10-CM | POA: Diagnosis not present

## 2018-02-16 DIAGNOSIS — C7A090 Malignant carcinoid tumor of the bronchus and lung: Secondary | ICD-10-CM

## 2018-02-16 DIAGNOSIS — Z9221 Personal history of antineoplastic chemotherapy: Secondary | ICD-10-CM | POA: Diagnosis not present

## 2018-02-16 DIAGNOSIS — Z7984 Long term (current) use of oral hypoglycemic drugs: Secondary | ICD-10-CM

## 2018-02-16 DIAGNOSIS — G629 Polyneuropathy, unspecified: Secondary | ICD-10-CM | POA: Diagnosis not present

## 2018-02-16 DIAGNOSIS — Z79899 Other long term (current) drug therapy: Secondary | ICD-10-CM | POA: Diagnosis not present

## 2018-02-16 DIAGNOSIS — D3A090 Benign carcinoid tumor of the bronchus and lung: Secondary | ICD-10-CM

## 2018-02-16 DIAGNOSIS — C9001 Multiple myeloma in remission: Secondary | ICD-10-CM

## 2018-02-16 NOTE — Assessment & Plan Note (Signed)
As above, I recommend calcium with vitamin D only I will check vitamin D level in he next visit

## 2018-02-16 NOTE — Progress Notes (Signed)
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 We discussed the risks and benefits of discontinuation of treatment. She stopped Ninlaro in March 2017 Recent blood work showed that she continues to remain in remission We have stopped Zometa after numerous doses; recent bone density scan show mild osteopenia only. I recommend repeat bone density again in October 2020 She will continue calcium with vitamin D supplement. She agreed with the plan of care. I will see her back in 3 months for further assessment   Osteopenia due to cancer therapy As above, I recommend calcium with vitamin D only I will check vitamin D level in he next visit  Carcinoid tumor of left lung She is not symptomatic. Observe only. She will continue follow-up at [facility] This Encounter  Procedures  . CBC with Differential/Platelet    Standing Status:   Future    Standing Expiration Date:   03/23/2019  . Comprehensive metabolic panel    Standing Status:   Future    Standing Expiration Date:   03/23/2019  . VITAMIN D 25 Hydroxy (Vit-D Deficiency, Fractures)    Standing Status:   Future    Standing Expiration Date:   08/17/2019  . Multiple Myeloma Panel (SPEP&IFE w/QIG)    Standing Status:   Future    Standing Expiration Date:   03/23/2019  . Kappa/lambda light chains    Standing Status:   Future    Standing Expiration Date:   03/23/2019    INTERVAL HISTORY: Please see below for problem oriented charting. She returns for further follow-up She  denies recent infection No new bone pain No recent dental issue She denies cough, chest pain or dyspnea  SUMMARY OF ONCOLOGIC HISTORY:   Multiple myeloma in remission ([place])   05/27/2012 Initial Diagnosis    Multiple myeloma, without mention of having achieved remission(203.00)    05/27/2012 Cancer Staging    1.  IgG kappa multiple myeloma. She presented in 05/2012 with anemia, Cr 0.8, Ca 9.6; no bone lytic lesion; LDH 140; beta2- microglobulin 1.86 (ref 1.01-1.73). Positive SPEP for M-spike at 1.69, IgG 2080, kappa 7.24, kappa:lambda ratio 24.97; UPEP positive    06/08/2012 Bone Marrow Biopsy    Bone marrow biopsy on 06/08/12 showed 19% plasma cell; normal cytogenetics; myeloma FISH pending.     06/14/2012 Imaging    Skeletal survey negative for bone lytic lesions.  Lung nodule apparent.    09/17/2012 Bone Marrow Biopsy    Bone marrow biopsy showed 11% plasma cells.    10/18/2012 - 12/31/2012 Chemotherapy    Started on VRD (Velcade 1.3 mg/m^2  days 1,8 and 15; Revlimide 25 mg PO daily, on day 1 through 14; Decadron 40 mg PO Days 1, 8, 15).   Started on coumadin given RD.    01/14/2013 Bone Marrow Biopsy    The bone marrow clot sections are normocellular toslightly hypercellular for age (approximately 40% overall). 1% plasma cells.    02/08/2013 Bone Marrow Transplant    Auto Transplant at [facility] (Dr. Glorianne Manchester McIver).    11/30/2013 - 08/23/2014 Chemotherapy    Velcade maintence therapy to start  today q 2 weeks, discontinued due to neuropathy    03/15/2014 Imaging    CT scan of the chest excluded pulmonary emboli    07/26/2014 Adverse Reaction    Velcade dose is reduced due to neuropathy    09/26/2014 - 02/23/2015 Chemotherapy    Treatment was switched to Ninlaro due to neuropathy    02/23/2015 Adverse Reaction    Ninlaro was stopped due to worsening neuropathy    10/30/2016 Imaging    The BMD measured at Femur Neck Left is 0.763 g/cm2 with a T-score of -2.0. 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 Lamonte Sakai)    07/08/2012 Pathology Results    Pathology consistent with Non-small cell lung cancer of RLL.     07/14/2012 Imaging    1. Right lower lobe nodule is non hypermetabolic which suggests benign etiology considering the slow growing rate in 6 years or non FDG avid disease as seen with carcinoid tumors. Pending biopsy. No PET evidence of mediastinal nodal disease.     08/02/2012 Imaging    MRI of brain negative.    08/12/2012 Surgery    Robotic Lobectomy of R Lower lobe.     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     Surgical resection margins are negative.     Two benign lymph nodes.     pTa1N0     REVIEW OF SYSTEMS:   Constitutional: Denies fevers, chills or abnormal weight loss Eyes: Denies blurriness of vision Ears, nose, mouth, throat, and face: Denies mucositis or sore throat Respiratory: Denies cough, dyspnea or wheezes Cardiovascular: Denies palpitation, chest discomfort or lower extremity swelling Gastrointestinal:  Denies nausea, heartburn or change in bowel habits Skin: Denies abnormal skin rashes Lymphatics: Denies new lymphadenopathy or easy bruising Neurological:Denies numbness, tingling or new weaknesses Behavioral/Psych: Mood is stable, no new changes  All other systems were reviewed with the patient and are negative.  I have reviewed the past medical history, past surgical history, social history and family history with the patient and they are unchanged from previous note.  ALLERGIES:  has No Known Allergies.  MEDICATIONS:  Current Outpatient Medications  Medication Sig Dispense Refill  . buPROPion (WELLBUTRIN XL) 300  MG 24 hr tablet Take 300 mg by mouth daily.    . cholecalciferol (VITAMIN D) 1000 UNITS tablet Take 2,000 Units by mouth daily.    Marland Kitchen losartan (COZAAR) 50 MG tablet Take 50 mg by mouth daily.    . magnesium chloride (SLOW-MAG) 64 MG TBEC SR tablet Take 1 tablet by mouth 2 (two) times daily.     . metFORMIN (GLUCOPHAGE) 500 MG tablet Take 1 tablet (500 mg total) by mouth 2 (two) times daily with a meal. 60 tablet 0  . metoprolol succinate (TOPROL-XL) 50 MG 24 hr tablet Take 50 mg by mouth daily. Take with or immediately following a meal.    . Multiple Vitamins-Minerals (MULTIVITAMIN WITH MINERALS) tablet Take 1 tablet by mouth at bedtime.     . Multiple Vitamins-Minerals (PRESERVISION AREDS 2) CAPS Take 1 capsule by mouth 2 (two) times daily.    . sertraline (ZOLOFT) 100 MG tablet Take 100 mg by  mouth daily.     . simvastatin (ZOCOR) 40 MG tablet Take 40 mg by mouth at bedtime.      No current facility-administered medications for this visit.     PHYSICAL EXAMINATION: ECOG PERFORMANCE STATUS: 1 - Symptomatic but completely ambulatory  Vitals:   02/16/18 1205  BP: 138/60  Pulse: 95  Resp: 18  Temp: (!) 97.5 F (36.4 C)  SpO2: 95%   Filed Weights   02/16/18 1205  Weight: 200 lb (90.7 kg)    GENERAL:alert, no distress and comfortable SKIN: skin color, texture, turgor are normal, no rashes or significant lesions EYES: normal, Conjunctiva are pink and non-injected, sclera clear OROPHARYNX:no exudate, no erythema and lips, buccal mucosa, and tongue normal  NECK: supple, thyroid normal size, non-tender, without nodularity LYMPH:  no palpable lymphadenopathy in the cervical, axillary or inguinal LUNGS: clear to auscultation and percussion with normal breathing effort HEART: regular rate & rhythm and no murmurs and no lower extremity edema ABDOMEN:abdomen soft, non-tender and normal bowel sounds Musculoskeletal:no cyanosis of digits and no clubbing  NEURO: alert & oriented x 3 with  fluent speech, no focal motor/sensory deficits  LABORATORY DATA:  I have reviewed the data as listed    Component Value Date/Time   NA 141 02/09/2018 1507   NA 141 12/05/2016 1447   K 4.6 02/09/2018 1507   K 4.3 12/05/2016 1447   CL 107 02/09/2018 1507   CL 105 06/08/2012 0913   CO2 24 02/09/2018 1507   CO2 23 12/05/2016 1447   GLUCOSE 131 (H) 02/09/2018 1507   GLUCOSE 131 12/05/2016 1447   GLUCOSE 120 (H) 06/08/2012 0913   BUN 13 02/09/2018 1507   BUN 17.0 12/05/2016 1447   CREATININE 0.78 02/09/2018 1507   CREATININE 0.8 12/05/2016 1447   CALCIUM 9.4 02/09/2018 1507   CALCIUM 9.1 12/05/2016 1447   PROT 7.2 02/09/2018 1507   PROT 6.8 12/05/2016 1447   ALBUMIN 3.3 (L) 02/09/2018 1507   ALBUMIN 3.7 12/05/2016 1447   AST 26 02/09/2018 1507   AST 27 12/05/2016 1447   ALT 25 02/09/2018 1507   ALT 43 12/05/2016 1447   ALKPHOS 142 (H) 02/09/2018 1507   ALKPHOS 85 12/05/2016 1447   BILITOT 0.5 02/09/2018 1507   BILITOT 0.68 12/05/2016 1447   GFRNONAA >60 02/09/2018 1507   GFRAA >60 02/09/2018 1507    No results found for: SPEP, UPEP  Lab Results  Component Value Date   WBC 4.7 02/09/2018   NEUTROABS 3.0 02/09/2018   HGB 11.4 (L) 02/09/2018   HCT 36.0 02/09/2018   MCV 95.7 02/09/2018   PLT 189 02/09/2018      Chemistry      Component Value Date/Time   NA 141 02/09/2018 1507   NA 141 12/05/2016 1447   K 4.6 02/09/2018 1507   K 4.3 12/05/2016 1447   CL 107 02/09/2018 1507   CL 105 06/08/2012 0913   CO2 24 02/09/2018 1507   CO2 23 12/05/2016 1447   BUN 13 02/09/2018 1507   BUN 17.0 12/05/2016 1447   CREATININE 0.78 02/09/2018 1507   CREATININE 0.8 12/05/2016 1447      Component Value Date/Time   CALCIUM 9.4 02/09/2018 1507   CALCIUM 9.1 12/05/2016 1447   ALKPHOS 142 (H) 02/09/2018 1507   ALKPHOS 85 12/05/2016 1447   AST 26 02/09/2018 1507   AST 27 12/05/2016 1447   ALT 25 02/09/2018 1507   ALT 43 12/05/2016 1447   BILITOT 0.5 02/09/2018 1507  BILITOT  0.68 12/05/2016 1447       All questions were answered. The patient knows to call the clinic with any problems, questions or concerns. No barriers to learning was detected.  I spent 15 minutes counseling the patient face to face. The total time spent in the appointment was 20 minutes and more than 50% was on counseling and review of test results  Heath Lark, MD 02/16/2018 4:27 PM

## 2018-02-16 NOTE — Assessment & Plan Note (Signed)
She is not symptomatic. Observe only. She will continue follow-up at [place]

## 2018-02-16 NOTE — Telephone Encounter (Signed)
Gave patient avs report and appointments for May  °

## 2018-02-16 NOTE — Assessment & Plan Note (Signed)
Her myeloma treatment has to be discontinued due to worsening neuropathy. We discussed the risks and benefits of discontinuation of treatment. She stopped Ninlaro in March 2017 Recent blood work showed that she continues to remain in remission We have stopped Zometa after numerous doses; recent bone density scan show mild osteopenia only. I recommend repeat bone density again in October 2020 She will continue calcium with vitamin D supplement. She agreed with the plan of care. I will see her back in 3 months for further assessment

## 2018-02-26 DIAGNOSIS — R911 Solitary pulmonary nodule: Secondary | ICD-10-CM | POA: Diagnosis not present

## 2018-02-26 DIAGNOSIS — C3431 Malignant neoplasm of lower lobe, right bronchus or lung: Secondary | ICD-10-CM | POA: Diagnosis not present

## 2018-02-26 DIAGNOSIS — C3430 Malignant neoplasm of lower lobe, unspecified bronchus or lung: Secondary | ICD-10-CM | POA: Diagnosis not present

## 2018-03-12 DIAGNOSIS — C9001 Multiple myeloma in remission: Secondary | ICD-10-CM | POA: Diagnosis not present

## 2018-03-12 DIAGNOSIS — Z9484 Stem cells transplant status: Secondary | ICD-10-CM | POA: Diagnosis not present

## 2018-03-16 DIAGNOSIS — C9001 Multiple myeloma in remission: Secondary | ICD-10-CM | POA: Diagnosis not present

## 2018-03-16 DIAGNOSIS — R590 Localized enlarged lymph nodes: Secondary | ICD-10-CM | POA: Diagnosis not present

## 2018-03-16 DIAGNOSIS — R911 Solitary pulmonary nodule: Secondary | ICD-10-CM | POA: Diagnosis not present

## 2018-03-16 DIAGNOSIS — Z859 Personal history of malignant neoplasm, unspecified: Secondary | ICD-10-CM | POA: Diagnosis not present

## 2018-04-02 DIAGNOSIS — Z902 Acquired absence of lung [part of]: Secondary | ICD-10-CM | POA: Diagnosis not present

## 2018-04-02 DIAGNOSIS — R590 Localized enlarged lymph nodes: Secondary | ICD-10-CM | POA: Insufficient documentation

## 2018-04-02 DIAGNOSIS — C3430 Malignant neoplasm of lower lobe, unspecified bronchus or lung: Secondary | ICD-10-CM | POA: Diagnosis not present

## 2018-04-02 DIAGNOSIS — Z85110 Personal history of malignant carcinoid tumor of bronchus and lung: Secondary | ICD-10-CM | POA: Diagnosis not present

## 2018-04-02 DIAGNOSIS — Z87891 Personal history of nicotine dependence: Secondary | ICD-10-CM | POA: Diagnosis not present

## 2018-04-02 DIAGNOSIS — Z905 Acquired absence of kidney: Secondary | ICD-10-CM | POA: Diagnosis not present

## 2018-04-29 DIAGNOSIS — H353112 Nonexudative age-related macular degeneration, right eye, intermediate dry stage: Secondary | ICD-10-CM | POA: Diagnosis not present

## 2018-04-30 ENCOUNTER — Encounter (INDEPENDENT_AMBULATORY_CARE_PROVIDER_SITE_OTHER): Payer: Medicare Other | Admitting: Ophthalmology

## 2018-04-30 ENCOUNTER — Other Ambulatory Visit: Payer: Self-pay

## 2018-04-30 DIAGNOSIS — H353221 Exudative age-related macular degeneration, left eye, with active choroidal neovascularization: Secondary | ICD-10-CM | POA: Diagnosis not present

## 2018-04-30 DIAGNOSIS — I10 Essential (primary) hypertension: Secondary | ICD-10-CM | POA: Diagnosis not present

## 2018-04-30 DIAGNOSIS — H353112 Nonexudative age-related macular degeneration, right eye, intermediate dry stage: Secondary | ICD-10-CM

## 2018-04-30 DIAGNOSIS — H35033 Hypertensive retinopathy, bilateral: Secondary | ICD-10-CM

## 2018-04-30 DIAGNOSIS — H2513 Age-related nuclear cataract, bilateral: Secondary | ICD-10-CM

## 2018-04-30 DIAGNOSIS — H43813 Vitreous degeneration, bilateral: Secondary | ICD-10-CM

## 2018-05-28 ENCOUNTER — Encounter (INDEPENDENT_AMBULATORY_CARE_PROVIDER_SITE_OTHER): Payer: Medicare Other | Admitting: Ophthalmology

## 2018-05-31 DIAGNOSIS — M25572 Pain in left ankle and joints of left foot: Secondary | ICD-10-CM | POA: Diagnosis not present

## 2018-06-02 ENCOUNTER — Other Ambulatory Visit: Payer: Self-pay

## 2018-06-02 ENCOUNTER — Encounter (INDEPENDENT_AMBULATORY_CARE_PROVIDER_SITE_OTHER): Payer: Medicare Other | Admitting: Ophthalmology

## 2018-06-02 DIAGNOSIS — I10 Essential (primary) hypertension: Secondary | ICD-10-CM

## 2018-06-02 DIAGNOSIS — H353112 Nonexudative age-related macular degeneration, right eye, intermediate dry stage: Secondary | ICD-10-CM | POA: Diagnosis not present

## 2018-06-02 DIAGNOSIS — H43813 Vitreous degeneration, bilateral: Secondary | ICD-10-CM

## 2018-06-02 DIAGNOSIS — H353221 Exudative age-related macular degeneration, left eye, with active choroidal neovascularization: Secondary | ICD-10-CM

## 2018-06-02 DIAGNOSIS — H35033 Hypertensive retinopathy, bilateral: Secondary | ICD-10-CM

## 2018-06-08 ENCOUNTER — Inpatient Hospital Stay: Payer: Medicare Other | Attending: Hematology and Oncology

## 2018-06-08 ENCOUNTER — Telehealth: Payer: Self-pay | Admitting: Hematology and Oncology

## 2018-06-08 DIAGNOSIS — M8580 Other specified disorders of bone density and structure, unspecified site: Secondary | ICD-10-CM | POA: Insufficient documentation

## 2018-06-08 DIAGNOSIS — G629 Polyneuropathy, unspecified: Secondary | ICD-10-CM | POA: Insufficient documentation

## 2018-06-08 DIAGNOSIS — C9001 Multiple myeloma in remission: Secondary | ICD-10-CM | POA: Insufficient documentation

## 2018-06-08 DIAGNOSIS — Z9221 Personal history of antineoplastic chemotherapy: Secondary | ICD-10-CM | POA: Insufficient documentation

## 2018-06-08 DIAGNOSIS — Z7984 Long term (current) use of oral hypoglycemic drugs: Secondary | ICD-10-CM | POA: Insufficient documentation

## 2018-06-08 DIAGNOSIS — C7A090 Malignant carcinoid tumor of the bronchus and lung: Secondary | ICD-10-CM | POA: Insufficient documentation

## 2018-06-08 DIAGNOSIS — Z79899 Other long term (current) drug therapy: Secondary | ICD-10-CM | POA: Insufficient documentation

## 2018-06-08 NOTE — Telephone Encounter (Signed)
Called patient per 5/11 sch message - unable to reach pt . Left message for patient to call back for reschedule.

## 2018-06-15 ENCOUNTER — Inpatient Hospital Stay: Payer: Medicare Other | Admitting: Hematology and Oncology

## 2018-06-18 ENCOUNTER — Other Ambulatory Visit: Payer: Self-pay

## 2018-06-18 ENCOUNTER — Inpatient Hospital Stay: Payer: Medicare Other

## 2018-06-18 DIAGNOSIS — C9001 Multiple myeloma in remission: Secondary | ICD-10-CM | POA: Diagnosis not present

## 2018-06-18 DIAGNOSIS — C7A090 Malignant carcinoid tumor of the bronchus and lung: Secondary | ICD-10-CM | POA: Diagnosis not present

## 2018-06-18 DIAGNOSIS — M8580 Other specified disorders of bone density and structure, unspecified site: Secondary | ICD-10-CM

## 2018-06-18 DIAGNOSIS — G629 Polyneuropathy, unspecified: Secondary | ICD-10-CM | POA: Diagnosis not present

## 2018-06-18 DIAGNOSIS — Z9221 Personal history of antineoplastic chemotherapy: Secondary | ICD-10-CM | POA: Diagnosis not present

## 2018-06-18 DIAGNOSIS — Z79899 Other long term (current) drug therapy: Secondary | ICD-10-CM | POA: Diagnosis not present

## 2018-06-18 DIAGNOSIS — Z7984 Long term (current) use of oral hypoglycemic drugs: Secondary | ICD-10-CM | POA: Diagnosis not present

## 2018-06-18 LAB — COMPREHENSIVE METABOLIC PANEL
ALT: 41 U/L (ref 0–44)
AST: 28 U/L (ref 15–41)
Albumin: 3.8 g/dL (ref 3.5–5.0)
Alkaline Phosphatase: 100 U/L (ref 38–126)
Anion gap: 14 (ref 5–15)
BUN: 21 mg/dL (ref 8–23)
CO2: 21 mmol/L — ABNORMAL LOW (ref 22–32)
Calcium: 9.7 mg/dL (ref 8.9–10.3)
Chloride: 108 mmol/L (ref 98–111)
Creatinine, Ser: 1.03 mg/dL — ABNORMAL HIGH (ref 0.44–1.00)
GFR calc Af Amer: >60 mL/min (ref >60)
GFR calc non Af Amer: 55 mL/min — ABNORMAL LOW (ref >60)
Glucose, Bld: 135 mg/dL — ABNORMAL HIGH (ref 70–99)
Potassium: 4.6 mmol/L (ref 3.5–5.1)
Sodium: 143 mmol/L (ref 135–145)
Total Bilirubin: 0.5 mg/dL (ref 0.3–1.2)
Total Protein: 7.5 g/dL (ref 6.5–8.1)

## 2018-06-18 LAB — CBC WITH DIFFERENTIAL/PLATELET
Abs Immature Granulocytes: 0 10*3/uL (ref 0.00–0.07)
Basophils Absolute: 0 10*3/uL (ref 0.0–0.1)
Basophils Relative: 1 %
Eosinophils Absolute: 0.2 10*3/uL (ref 0.0–0.5)
Eosinophils Relative: 3 %
HCT: 40.6 % (ref 36.0–46.0)
Hemoglobin: 12.6 g/dL (ref 12.0–15.0)
Immature Granulocytes: 0 %
Lymphocytes Relative: 30 %
Lymphs Abs: 1.6 10*3/uL (ref 0.7–4.0)
MCH: 30.7 pg (ref 26.0–34.0)
MCHC: 31 g/dL (ref 30.0–36.0)
MCV: 99 fL (ref 80.0–100.0)
Monocytes Absolute: 0.4 10*3/uL (ref 0.1–1.0)
Monocytes Relative: 7 %
Neutro Abs: 3.2 10*3/uL (ref 1.7–7.7)
Neutrophils Relative %: 59 %
Platelets: 188 10*3/uL (ref 150–400)
RBC: 4.1 MIL/uL (ref 3.87–5.11)
RDW: 15.1 % (ref 11.5–15.5)
WBC: 5.3 10*3/uL (ref 4.0–10.5)
nRBC: 0 % (ref 0.0–0.2)

## 2018-06-19 LAB — VITAMIN D 25 HYDROXY (VIT D DEFICIENCY, FRACTURES): Vit D, 25-Hydroxy: 43.1 ng/mL (ref 30.0–100.0)

## 2018-06-22 LAB — KAPPA/LAMBDA LIGHT CHAINS
Kappa free light chain: 18 mg/L (ref 3.3–19.4)
Kappa, lambda light chain ratio: 1.15 (ref 0.26–1.65)
Lambda free light chains: 15.6 mg/L (ref 5.7–26.3)

## 2018-06-22 LAB — MULTIPLE MYELOMA PANEL, SERUM
Albumin SerPl Elph-Mcnc: 3.6 g/dL (ref 2.9–4.4)
Albumin/Glob SerPl: 1.1 (ref 0.7–1.7)
Alpha 1: 0.3 g/dL (ref 0.0–0.4)
Alpha2 Glob SerPl Elph-Mcnc: 0.8 g/dL (ref 0.4–1.0)
B-Globulin SerPl Elph-Mcnc: 1.3 g/dL (ref 0.7–1.3)
Gamma Glob SerPl Elph-Mcnc: 0.9 g/dL (ref 0.4–1.8)
Globulin, Total: 3.3 g/dL (ref 2.2–3.9)
IgA: 223 mg/dL (ref 64–422)
IgG (Immunoglobin G), Serum: 1028 mg/dL (ref 586–1602)
IgM (Immunoglobulin M), Srm: 35 mg/dL (ref 26–217)
Total Protein ELP: 6.9 g/dL (ref 6.0–8.5)

## 2018-06-25 ENCOUNTER — Inpatient Hospital Stay (HOSPITAL_BASED_OUTPATIENT_CLINIC_OR_DEPARTMENT_OTHER): Payer: Medicare Other | Admitting: Hematology and Oncology

## 2018-06-25 ENCOUNTER — Encounter: Payer: Self-pay | Admitting: Hematology and Oncology

## 2018-06-25 ENCOUNTER — Other Ambulatory Visit: Payer: Self-pay

## 2018-06-25 DIAGNOSIS — Z9221 Personal history of antineoplastic chemotherapy: Secondary | ICD-10-CM

## 2018-06-25 DIAGNOSIS — M8580 Other specified disorders of bone density and structure, unspecified site: Secondary | ICD-10-CM | POA: Diagnosis not present

## 2018-06-25 DIAGNOSIS — G629 Polyneuropathy, unspecified: Secondary | ICD-10-CM

## 2018-06-25 DIAGNOSIS — D3A090 Benign carcinoid tumor of the bronchus and lung: Secondary | ICD-10-CM

## 2018-06-25 DIAGNOSIS — Z79899 Other long term (current) drug therapy: Secondary | ICD-10-CM | POA: Diagnosis not present

## 2018-06-25 DIAGNOSIS — Z7984 Long term (current) use of oral hypoglycemic drugs: Secondary | ICD-10-CM | POA: Diagnosis not present

## 2018-06-25 DIAGNOSIS — C7A090 Malignant carcinoid tumor of the bronchus and lung: Secondary | ICD-10-CM | POA: Diagnosis not present

## 2018-06-25 DIAGNOSIS — C9001 Multiple myeloma in remission: Secondary | ICD-10-CM

## 2018-06-25 NOTE — Assessment & Plan Note (Signed)
She is being followed at [facility] She is not symptomatic Recent PET CT scan to take some mild abnormalities and she has appointment scheduled to return there for further follow-up.  I will defer to her physician at [facility] for further management.

## 2018-06-25 NOTE — Assessment & Plan Note (Signed)
Her myeloma treatment has to be discontinued due to worsening neuropathy. We discussed the risks and benefits of discontinuation of treatment. She stopped Ninlaro in March 2017 Recent blood work showed that she continues to remain in remission We have stopped Zometa after numerous doses; recent bone density scan show mild osteopenia only. I recommend repeat bone density again in October 2020 She will continue calcium with vitamin D supplement. She agreed with the plan of care. I will see her back in 4 months for further assessment

## 2018-06-25 NOTE — Progress Notes (Signed)
[facility] OFFICE PROGRESS NOTE  Patient Care Team: Maurice Small, MD as PCP - General (Family Medicine) Laurence Spates, MD as Attending Physician (Gastroenterology) Jerrell Belfast, MD as Attending Physician (Otolaryngology) Collene Gobble, MD as Attending Physician (Pulmonary Disease) [place], Glorianne Manchester, DO as Consulting Physician (Hematology)  ASSESSMENT & PLAN:  Multiple myeloma in remission Her myeloma treatment has to be discontinued due to worsening neuropathy. We discussed the risks and benefits of discontinuation of treatment. She stopped Ninlaro in March 2017 Recent blood work showed that she continues to remain in remission We have stopped Zometa after numerous doses; recent bone density scan show mild osteopenia only. I recommend repeat bone density again in October 2020 She will continue calcium with vitamin D supplement. She agreed with the plan of care. I will see her back in 4 months for further assessment   Carcinoid tumor of left lung She is being followed at [facility] She is not symptomatic Recent PET CT scan to take some mild abnormalities and she has appointment scheduled to return there for further follow-up.  I will defer to her physician at [facility] for further management.   Orders Placed This Encounter  Procedures  . CBC with Differential/Platelet    Standing Status:   Future    Standing Expiration Date:   07/30/2019  . Comprehensive metabolic panel    Standing Status:   Future    Standing Expiration Date:   07/30/2019  . Kappa/lambda light chains    Standing Status:   Future    Standing Expiration Date:   07/30/2019  . Multiple Myeloma Panel (SPEP&IFE w/QIG)    Standing Status:   Future    Standing Expiration Date:   07/30/2019    INTERVAL HISTORY: Please see below for problem oriented charting. She returns for further follow-up No recent bone pain Denies recent infection, fever or chills No recent cough, chest pain or  shortness of breath She is being followed closely at [facility] due to recent abnormal imaging study She has no chest symptoms.  No recent dental issues.  SUMMARY OF ONCOLOGIC HISTORY:   Multiple myeloma in remission ([place])   05/27/2012 Initial Diagnosis    Multiple myeloma, without mention of having achieved remission(203.00)    05/27/2012 Cancer Staging    1.  IgG kappa multiple myeloma. She presented in 05/2012 with anemia, Cr 0.8, Ca 9.6; no bone lytic lesion; LDH 140; beta2- microglobulin 1.86 (ref 1.01-1.73). Positive SPEP for M-spike at 1.69, IgG 2080, kappa 7.24, kappa:lambda ratio 24.97; UPEP positive    06/08/2012 Bone Marrow Biopsy    Bone marrow biopsy on 06/08/12 showed 19% plasma cell; normal cytogenetics; myeloma FISH pending.     06/14/2012 Imaging    Skeletal survey negative for bone lytic lesions.  Lung nodule apparent.    09/17/2012 Bone Marrow Biopsy    Bone marrow biopsy showed 11% plasma cells.    10/18/2012 - 12/31/2012 Chemotherapy    Started on VRD (Velcade 1.3 mg/m^2 [place] days 1,8 and 15; Revlimide 25 mg PO daily, on day 1 through 14; Decadron 40 mg PO Days 1, 8, 15).   Started on coumadin given RD.    01/14/2013 Bone Marrow Biopsy    The bone marrow clot sections are normocellular toslightly hypercellular for age (approximately 40% overall). 1% plasma cells.    02/08/2013 Bone Marrow Transplant    Auto Transplant at [facility] (Dr. Glorianne Manchester McIver).    11/30/2013 - 08/23/2014 Chemotherapy    Velcade  maintence therapy to start today q 2 weeks, discontinued due to neuropathy    03/15/2014 Imaging    CT scan of the chest excluded pulmonary emboli    07/26/2014 Adverse Reaction    Velcade dose is reduced due to neuropathy    09/26/2014 - 02/23/2015 Chemotherapy    Treatment was switched to Ninlaro due to neuropathy    02/23/2015 Adverse Reaction    Ninlaro was stopped due to worsening neuropathy    10/30/2016 Imaging    The BMD measured at Femur Neck Left is  0.763 g/cm2 with a T-score of -2.0. 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 Lamonte Sakai)    07/08/2012 Pathology Results    Pathology consistent with Non-small cell lung cancer of RLL.     07/14/2012 Imaging    1. Right lower lobe nodule is non hypermetabolic which suggests benign etiology considering the slow growing rate in 6 years or non FDG avid disease as seen with carcinoid tumors. Pending biopsy. No PET evidence of mediastinal nodal disease.     08/02/2012 Imaging    MRI of brain negative.    08/12/2012 Surgery    Robotic Lobectomy of R Lower lobe.     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     Surgical resection margins are negative.     Two benign lymph nodes.     pTa1N0     REVIEW OF SYSTEMS:   Constitutional: Denies fevers, chills or abnormal weight loss Eyes: Denies blurriness of vision Ears, nose, mouth, throat, and face: Denies mucositis or sore throat Respiratory: Denies cough, dyspnea or wheezes Cardiovascular: Denies palpitation, chest discomfort or lower extremity swelling Gastrointestinal:  Denies nausea, heartburn or change in bowel habits Skin: Denies abnormal skin rashes Lymphatics: Denies new lymphadenopathy or easy bruising Neurological:Denies numbness, tingling or new weaknesses Behavioral/Psych: Mood is stable, no new changes  All other systems were reviewed with the patient and are negative.  I have reviewed the past medical history, past surgical history, social history and family history with the patient and they are unchanged from previous note.  ALLERGIES:  has No Known Allergies.  MEDICATIONS:  Current Outpatient Medications  Medication Sig  Dispense Refill  . buPROPion (WELLBUTRIN XL) 300 MG 24 hr tablet Take 300 mg by mouth daily.    . cholecalciferol (VITAMIN D) 1000 UNITS tablet Take 2,000 Units by mouth daily.    Marland Kitchen losartan (COZAAR) 50 MG tablet Take 50 mg by mouth daily.    . magnesium chloride (SLOW-MAG) 64 MG TBEC SR tablet Take 1 tablet by mouth 2 (two) times daily.     . metFORMIN (GLUCOPHAGE) 500 MG tablet Take 1 tablet (500 mg total) by mouth 2 (two) times daily with a meal. 60 tablet 0  . metoprolol succinate (TOPROL-XL) 50 MG 24 hr tablet Take 50 mg by mouth daily. Take with or immediately following a meal.    . Multiple Vitamins-Minerals (MULTIVITAMIN WITH MINERALS) tablet Take 1 tablet by mouth at bedtime.     . Multiple Vitamins-Minerals (PRESERVISION AREDS 2) CAPS Take 1 capsule by mouth 2 (two) times daily.    . sertraline (ZOLOFT) 100 MG tablet  Take 100 mg by mouth daily.     . simvastatin (ZOCOR) 40 MG tablet Take 40 mg by mouth at bedtime.      No current facility-administered medications for this visit.     PHYSICAL EXAMINATION: ECOG PERFORMANCE STATUS: 0 - Asymptomatic  Vitals:   06/25/18 1221  BP: 134/76  Pulse: 75  Resp: 17  Temp: (!) 97.3 F (36.3 C)  SpO2: 97%   Filed Weights   06/25/18 1221  Weight: 201 lb 12.8 oz (91.5 kg)    GENERAL:alert, no distress and comfortable SKIN: skin color, texture, turgor are normal, no rashes or significant lesions EYES: normal, Conjunctiva are pink and non-injected, sclera clear OROPHARYNX:no exudate, no erythema and lips, buccal mucosa, and tongue normal  NECK: supple, thyroid normal size, non-tender, without nodularity LYMPH:  no palpable lymphadenopathy in the cervical, axillary or inguinal LUNGS: clear to auscultation and percussion with normal breathing effort HEART: regular rate & rhythm and no murmurs and no lower extremity edema ABDOMEN:abdomen soft, non-tender and normal bowel sounds Musculoskeletal:no cyanosis of digits and no clubbing   NEURO: alert & oriented x 3 with fluent speech, no focal motor/sensory deficits  LABORATORY DATA:  I have reviewed the data as listed    Component Value Date/Time   NA 143 06/18/2018 1502   NA 141 12/05/2016 1447   K 4.6 06/18/2018 1502   K 4.3 12/05/2016 1447   CL 108 06/18/2018 1502   CL 105 06/08/2012 0913   CO2 21 (L) 06/18/2018 1502   CO2 23 12/05/2016 1447   GLUCOSE 135 (H) 06/18/2018 1502   GLUCOSE 131 12/05/2016 1447   GLUCOSE 120 (H) 06/08/2012 0913   BUN 21 06/18/2018 1502   BUN 17.0 12/05/2016 1447   CREATININE 1.03 (H) 06/18/2018 1502   CREATININE 0.8 12/05/2016 1447   CALCIUM 9.7 06/18/2018 1502   CALCIUM 9.1 12/05/2016 1447   PROT 7.5 06/18/2018 1502   PROT 6.8 12/05/2016 1447   ALBUMIN 3.8 06/18/2018 1502   ALBUMIN 3.7 12/05/2016 1447   AST 28 06/18/2018 1502   AST 27 12/05/2016 1447   ALT 41 06/18/2018 1502   ALT 43 12/05/2016 1447   ALKPHOS 100 06/18/2018 1502   ALKPHOS 85 12/05/2016 1447   BILITOT 0.5 06/18/2018 1502   BILITOT 0.68 12/05/2016 1447   GFRNONAA 55 (L) 06/18/2018 1502   GFRAA >60 06/18/2018 1502    No results found for: SPEP, UPEP  Lab Results  Component Value Date   WBC 5.3 06/18/2018   NEUTROABS 3.2 06/18/2018   HGB 12.6 06/18/2018   HCT 40.6 06/18/2018   MCV 99.0 06/18/2018   PLT 188 06/18/2018      Chemistry      Component Value Date/Time   NA 143 06/18/2018 1502   NA 141 12/05/2016 1447   K 4.6 06/18/2018 1502   K 4.3 12/05/2016 1447   CL 108 06/18/2018 1502   CL 105 06/08/2012 0913   CO2 21 (L) 06/18/2018 1502   CO2 23 12/05/2016 1447   BUN 21 06/18/2018 1502   BUN 17.0 12/05/2016 1447   CREATININE 1.03 (H) 06/18/2018 1502   CREATININE 0.8 12/05/2016 1447      Component Value Date/Time   CALCIUM 9.7 06/18/2018 1502   CALCIUM 9.1 12/05/2016 1447   ALKPHOS 100 06/18/2018 1502   ALKPHOS 85 12/05/2016 1447   AST 28 06/18/2018 1502   AST 27 12/05/2016 1447   ALT 41 06/18/2018 1502   ALT 43 12/05/2016 1447  BILITOT 0.5 06/18/2018 1502   BILITOT 0.68 12/05/2016 1447       All questions were answered. The patient knows to call the clinic with any problems, questions or concerns. No barriers to learning was detected.  I spent 15 minutes counseling the patient face to face. The total time spent in the appointment was 20 minutes and more than 50% was on counseling and review of test results  Heath Lark, MD 06/25/2018 1:03 PM

## 2018-06-28 ENCOUNTER — Telehealth: Payer: Self-pay | Admitting: Hematology and Oncology

## 2018-06-28 NOTE — Telephone Encounter (Signed)
Tried to reach regarding schedule °

## 2018-07-01 ENCOUNTER — Other Ambulatory Visit: Payer: Self-pay

## 2018-07-01 ENCOUNTER — Encounter (INDEPENDENT_AMBULATORY_CARE_PROVIDER_SITE_OTHER): Payer: Medicare Other | Admitting: Ophthalmology

## 2018-07-01 DIAGNOSIS — I10 Essential (primary) hypertension: Secondary | ICD-10-CM

## 2018-07-01 DIAGNOSIS — H35033 Hypertensive retinopathy, bilateral: Secondary | ICD-10-CM | POA: Diagnosis not present

## 2018-07-01 DIAGNOSIS — H353112 Nonexudative age-related macular degeneration, right eye, intermediate dry stage: Secondary | ICD-10-CM

## 2018-07-01 DIAGNOSIS — H43813 Vitreous degeneration, bilateral: Secondary | ICD-10-CM | POA: Diagnosis not present

## 2018-07-01 DIAGNOSIS — H353221 Exudative age-related macular degeneration, left eye, with active choroidal neovascularization: Secondary | ICD-10-CM | POA: Diagnosis not present

## 2018-07-23 DIAGNOSIS — R590 Localized enlarged lymph nodes: Secondary | ICD-10-CM | POA: Diagnosis not present

## 2018-07-23 DIAGNOSIS — Z9889 Other specified postprocedural states: Secondary | ICD-10-CM | POA: Diagnosis not present

## 2018-07-29 ENCOUNTER — Other Ambulatory Visit: Payer: Self-pay

## 2018-07-29 ENCOUNTER — Encounter (INDEPENDENT_AMBULATORY_CARE_PROVIDER_SITE_OTHER): Payer: Medicare Other | Admitting: Ophthalmology

## 2018-07-29 DIAGNOSIS — H43813 Vitreous degeneration, bilateral: Secondary | ICD-10-CM

## 2018-07-29 DIAGNOSIS — I10 Essential (primary) hypertension: Secondary | ICD-10-CM

## 2018-07-29 DIAGNOSIS — H2513 Age-related nuclear cataract, bilateral: Secondary | ICD-10-CM

## 2018-07-29 DIAGNOSIS — H353112 Nonexudative age-related macular degeneration, right eye, intermediate dry stage: Secondary | ICD-10-CM

## 2018-07-29 DIAGNOSIS — H353221 Exudative age-related macular degeneration, left eye, with active choroidal neovascularization: Secondary | ICD-10-CM | POA: Diagnosis not present

## 2018-07-29 DIAGNOSIS — H35033 Hypertensive retinopathy, bilateral: Secondary | ICD-10-CM | POA: Diagnosis not present

## 2018-08-02 DIAGNOSIS — S86812A Strain of other muscle(s) and tendon(s) at lower leg level, left leg, initial encounter: Secondary | ICD-10-CM | POA: Diagnosis not present

## 2018-08-30 ENCOUNTER — Encounter (INDEPENDENT_AMBULATORY_CARE_PROVIDER_SITE_OTHER): Payer: Medicare Other | Admitting: Ophthalmology

## 2018-08-30 ENCOUNTER — Other Ambulatory Visit: Payer: Self-pay

## 2018-08-30 DIAGNOSIS — H43813 Vitreous degeneration, bilateral: Secondary | ICD-10-CM

## 2018-08-30 DIAGNOSIS — H353112 Nonexudative age-related macular degeneration, right eye, intermediate dry stage: Secondary | ICD-10-CM

## 2018-08-30 DIAGNOSIS — I10 Essential (primary) hypertension: Secondary | ICD-10-CM | POA: Diagnosis not present

## 2018-08-30 DIAGNOSIS — H353221 Exudative age-related macular degeneration, left eye, with active choroidal neovascularization: Secondary | ICD-10-CM

## 2018-08-30 DIAGNOSIS — H35033 Hypertensive retinopathy, bilateral: Secondary | ICD-10-CM | POA: Diagnosis not present

## 2018-09-27 ENCOUNTER — Encounter (INDEPENDENT_AMBULATORY_CARE_PROVIDER_SITE_OTHER): Payer: Medicare Other | Admitting: Ophthalmology

## 2018-09-27 ENCOUNTER — Other Ambulatory Visit: Payer: Self-pay

## 2018-09-27 DIAGNOSIS — H35033 Hypertensive retinopathy, bilateral: Secondary | ICD-10-CM | POA: Diagnosis not present

## 2018-09-27 DIAGNOSIS — H2513 Age-related nuclear cataract, bilateral: Secondary | ICD-10-CM | POA: Diagnosis not present

## 2018-09-27 DIAGNOSIS — I10 Essential (primary) hypertension: Secondary | ICD-10-CM | POA: Diagnosis not present

## 2018-09-27 DIAGNOSIS — H353112 Nonexudative age-related macular degeneration, right eye, intermediate dry stage: Secondary | ICD-10-CM | POA: Diagnosis not present

## 2018-09-27 DIAGNOSIS — H353221 Exudative age-related macular degeneration, left eye, with active choroidal neovascularization: Secondary | ICD-10-CM

## 2018-09-27 DIAGNOSIS — H43813 Vitreous degeneration, bilateral: Secondary | ICD-10-CM | POA: Diagnosis not present

## 2018-10-19 ENCOUNTER — Other Ambulatory Visit: Payer: Self-pay

## 2018-10-19 ENCOUNTER — Inpatient Hospital Stay: Payer: Medicare Other | Attending: Hematology and Oncology

## 2018-10-19 DIAGNOSIS — C9001 Multiple myeloma in remission: Secondary | ICD-10-CM | POA: Insufficient documentation

## 2018-10-19 DIAGNOSIS — Z9221 Personal history of antineoplastic chemotherapy: Secondary | ICD-10-CM | POA: Insufficient documentation

## 2018-10-19 DIAGNOSIS — Z79899 Other long term (current) drug therapy: Secondary | ICD-10-CM | POA: Insufficient documentation

## 2018-10-19 DIAGNOSIS — I10 Essential (primary) hypertension: Secondary | ICD-10-CM | POA: Diagnosis not present

## 2018-10-19 DIAGNOSIS — E1165 Type 2 diabetes mellitus with hyperglycemia: Secondary | ICD-10-CM | POA: Insufficient documentation

## 2018-10-19 DIAGNOSIS — C7A090 Malignant carcinoid tumor of the bronchus and lung: Secondary | ICD-10-CM | POA: Insufficient documentation

## 2018-10-19 DIAGNOSIS — Z7984 Long term (current) use of oral hypoglycemic drugs: Secondary | ICD-10-CM | POA: Insufficient documentation

## 2018-10-19 DIAGNOSIS — M8580 Other specified disorders of bone density and structure, unspecified site: Secondary | ICD-10-CM | POA: Insufficient documentation

## 2018-10-19 DIAGNOSIS — Z23 Encounter for immunization: Secondary | ICD-10-CM | POA: Insufficient documentation

## 2018-10-19 LAB — COMPREHENSIVE METABOLIC PANEL
ALT: 41 U/L (ref 0–44)
AST: 31 U/L (ref 15–41)
Albumin: 3.9 g/dL (ref 3.5–5.0)
Alkaline Phosphatase: 84 U/L (ref 38–126)
Anion gap: 11 (ref 5–15)
BUN: 19 mg/dL (ref 8–23)
CO2: 25 mmol/L (ref 22–32)
Calcium: 9.5 mg/dL (ref 8.9–10.3)
Chloride: 106 mmol/L (ref 98–111)
Creatinine, Ser: 0.88 mg/dL (ref 0.44–1.00)
GFR calc Af Amer: >60 mL/min (ref >60)
GFR calc non Af Amer: >60 mL/min (ref >60)
Glucose, Bld: 124 mg/dL — ABNORMAL HIGH (ref 70–99)
Potassium: 4.7 mmol/L (ref 3.5–5.1)
Sodium: 142 mmol/L (ref 135–145)
Total Bilirubin: 0.4 mg/dL (ref 0.3–1.2)
Total Protein: 6.6 g/dL (ref 6.5–8.1)

## 2018-10-19 LAB — CBC WITH DIFFERENTIAL/PLATELET
Abs Immature Granulocytes: 0 10*3/uL (ref 0.00–0.07)
Basophils Absolute: 0 10*3/uL (ref 0.0–0.1)
Basophils Relative: 1 %
Eosinophils Absolute: 0.1 10*3/uL (ref 0.0–0.5)
Eosinophils Relative: 3 %
HCT: 36.5 % (ref 36.0–46.0)
Hemoglobin: 11.8 g/dL — ABNORMAL LOW (ref 12.0–15.0)
Immature Granulocytes: 0 %
Lymphocytes Relative: 36 %
Lymphs Abs: 1.4 10*3/uL (ref 0.7–4.0)
MCH: 31.7 pg (ref 26.0–34.0)
MCHC: 32.3 g/dL (ref 30.0–36.0)
MCV: 98.1 fL (ref 80.0–100.0)
Monocytes Absolute: 0.4 10*3/uL (ref 0.1–1.0)
Monocytes Relative: 9 %
Neutro Abs: 2.1 10*3/uL (ref 1.7–7.7)
Neutrophils Relative %: 51 %
Platelets: 154 10*3/uL (ref 150–400)
RBC: 3.72 MIL/uL — ABNORMAL LOW (ref 3.87–5.11)
RDW: 14 % (ref 11.5–15.5)
WBC: 4 10*3/uL (ref 4.0–10.5)
nRBC: 0 % (ref 0.0–0.2)

## 2018-10-20 LAB — MULTIPLE MYELOMA PANEL, SERUM
Albumin SerPl Elph-Mcnc: 3.8 g/dL (ref 2.9–4.4)
Albumin/Glob SerPl: 1.5 (ref 0.7–1.7)
Alpha 1: 0.2 g/dL (ref 0.0–0.4)
Alpha2 Glob SerPl Elph-Mcnc: 0.8 g/dL (ref 0.4–1.0)
B-Globulin SerPl Elph-Mcnc: 1 g/dL (ref 0.7–1.3)
Gamma Glob SerPl Elph-Mcnc: 0.7 g/dL (ref 0.4–1.8)
Globulin, Total: 2.7 g/dL (ref 2.2–3.9)
IgA: 187 mg/dL (ref 64–422)
IgG (Immunoglobin G), Serum: 854 mg/dL (ref 586–1602)
IgM (Immunoglobulin M), Srm: 33 mg/dL (ref 26–217)
Total Protein ELP: 6.5 g/dL (ref 6.0–8.5)

## 2018-10-20 LAB — KAPPA/LAMBDA LIGHT CHAINS
Kappa free light chain: 17.3 mg/L (ref 3.3–19.4)
Kappa, lambda light chain ratio: 1.21 (ref 0.26–1.65)
Lambda free light chains: 14.3 mg/L (ref 5.7–26.3)

## 2018-10-25 ENCOUNTER — Other Ambulatory Visit: Payer: Self-pay

## 2018-10-25 ENCOUNTER — Encounter (INDEPENDENT_AMBULATORY_CARE_PROVIDER_SITE_OTHER): Payer: Medicare Other | Admitting: Ophthalmology

## 2018-10-25 DIAGNOSIS — H353221 Exudative age-related macular degeneration, left eye, with active choroidal neovascularization: Secondary | ICD-10-CM | POA: Diagnosis not present

## 2018-10-25 DIAGNOSIS — H353112 Nonexudative age-related macular degeneration, right eye, intermediate dry stage: Secondary | ICD-10-CM

## 2018-10-25 DIAGNOSIS — H43813 Vitreous degeneration, bilateral: Secondary | ICD-10-CM

## 2018-10-25 DIAGNOSIS — H35033 Hypertensive retinopathy, bilateral: Secondary | ICD-10-CM | POA: Diagnosis not present

## 2018-10-25 DIAGNOSIS — I10 Essential (primary) hypertension: Secondary | ICD-10-CM

## 2018-10-26 ENCOUNTER — Inpatient Hospital Stay (HOSPITAL_BASED_OUTPATIENT_CLINIC_OR_DEPARTMENT_OTHER): Payer: Medicare Other | Admitting: Hematology and Oncology

## 2018-10-26 ENCOUNTER — Other Ambulatory Visit: Payer: Self-pay

## 2018-10-26 ENCOUNTER — Encounter: Payer: Self-pay | Admitting: Hematology and Oncology

## 2018-10-26 VITALS — BP 155/68 | HR 81 | Temp 98.0°F | Resp 18 | Ht 65.0 in | Wt 204.2 lb

## 2018-10-26 DIAGNOSIS — Z23 Encounter for immunization: Secondary | ICD-10-CM

## 2018-10-26 DIAGNOSIS — D3A090 Benign carcinoid tumor of the bronchus and lung: Secondary | ICD-10-CM

## 2018-10-26 DIAGNOSIS — I10 Essential (primary) hypertension: Secondary | ICD-10-CM

## 2018-10-26 DIAGNOSIS — C9001 Multiple myeloma in remission: Secondary | ICD-10-CM | POA: Diagnosis not present

## 2018-10-26 DIAGNOSIS — E1169 Type 2 diabetes mellitus with other specified complication: Secondary | ICD-10-CM

## 2018-10-26 DIAGNOSIS — C7A090 Malignant carcinoid tumor of the bronchus and lung: Secondary | ICD-10-CM | POA: Diagnosis not present

## 2018-10-26 DIAGNOSIS — M810 Age-related osteoporosis without current pathological fracture: Secondary | ICD-10-CM | POA: Diagnosis not present

## 2018-10-26 DIAGNOSIS — M8580 Other specified disorders of bone density and structure, unspecified site: Secondary | ICD-10-CM | POA: Diagnosis not present

## 2018-10-26 DIAGNOSIS — E1165 Type 2 diabetes mellitus with hyperglycemia: Secondary | ICD-10-CM | POA: Diagnosis not present

## 2018-10-26 MED ORDER — INFLUENZA VAC A&B SA ADJ QUAD 0.5 ML IM PRSY
0.5000 mL | PREFILLED_SYRINGE | Freq: Once | INTRAMUSCULAR | Status: AC
  Administered 2018-10-26: 09:00:00 0.5 mL via INTRAMUSCULAR

## 2018-10-26 MED ORDER — INFLUENZA VAC A&B SA ADJ QUAD 0.5 ML IM PRSY
PREFILLED_SYRINGE | INTRAMUSCULAR | Status: AC
  Filled 2018-10-26: qty 0.5

## 2018-10-26 NOTE — Assessment & Plan Note (Addendum)
Her myeloma treatment has to be discontinued due to worsening neuropathy. We discussed the risks and benefits of discontinuation of treatment. She stopped Ninlaro in March 2017 Recent blood work showed that she continues to remain in remission We have stopped Zometa after numerous doses; recent bone density scan show mild osteopenia only. I recommend repeat bone density again in October 2020 She will continue calcium with vitamin D supplement. She agreed with the plan of care. I will see her back in 4 months for further assessment   We discussed the importance of preventive care and reviewed the vaccination programs. She does not have any prior allergic reactions to influenza vaccination. She agrees to proceed with influenza vaccination today and we will administer it today at the clinic.

## 2018-10-26 NOTE — Assessment & Plan Note (Signed)
She is being followed at [facility] She is not symptomatic Her last PET CT scan to take some mild abnormalities and she has appointment scheduled to return there for further follow-up.  I will defer to her physician at [facility] for further management.

## 2018-10-26 NOTE — Assessment & Plan Note (Signed)
Her recent fasting blood sugar was elevated She will continue medical management

## 2018-10-26 NOTE — Assessment & Plan Note (Signed)
Her blood pressure is elevated today likely contributed by anxiety She will continue medical management as directed by her primary doctor

## 2018-10-26 NOTE — Progress Notes (Signed)
[place] OFFICE PROGRESS NOTE  Patient Care Team: Maurice Small, MD as PCP - General (Family Medicine) Laurence Spates, MD as Attending Physician (Gastroenterology) Jerrell Belfast, MD as Attending Physician (Otolaryngology) Collene Gobble, MD as Attending Physician (Pulmonary Disease) [place], Glorianne Manchester, DO as Consulting Physician (Hematology)  ASSESSMENT & PLAN:  Multiple myeloma in remission Her myeloma treatment has to be discontinued due to worsening neuropathy. We discussed the risks and benefits of discontinuation of treatment. She stopped Ninlaro in March 2017 Recent blood work showed that she continues to remain in remission We have stopped Zometa after numerous doses; recent bone density scan show mild osteopenia only. I recommend repeat bone density again in October 2020 She will continue calcium with vitamin D supplement. She agreed with the plan of care. I will see her back in 4 months for further assessment   We discussed the importance of preventive care and reviewed the vaccination programs. She does not have any prior allergic reactions to influenza vaccination. She agrees to proceed with influenza vaccination today and we will administer it today at the clinic.   Carcinoid tumor of left lung She is being followed at [facility] She is not symptomatic Her last PET CT scan to take some mild abnormalities and she has appointment scheduled to return there for further follow-up.  I will defer to her physician at [facility] for further management.  Diabetes Her recent fasting blood sugar was elevated She will continue medical management  BP (high blood pressure) Her blood pressure is elevated today likely contributed by anxiety She will continue medical management as directed by her primary doctor   Orders Placed This Encounter  Procedures  . DG Bone Density    Standing Status:   Future    Standing Expiration Date:   10/26/2019    Order  Specific Question:   Reason for Exam (SYMPTOM  OR DIAGNOSIS REQUIRED)    Answer:   osteoporosis, myeloma    Order Specific Question:   Preferred imaging location?    Answer:   [facility]    INTERVAL HISTORY: Please see below for problem oriented charting. She returns for further follow-up for multiple myeloma She is doing well No recent infection, fever or chills No new bone pain She had recent eye injection from her ophthalmologist Her blood pressure is mildly elevated today but at home, it is reasonably controlled  SUMMARY OF ONCOLOGIC HISTORY: Oncology History  Multiple myeloma in remission ([place])  05/27/2012 Initial Diagnosis   Multiple myeloma, without mention of having achieved remission(203.00)   05/27/2012 Cancer Staging   1.  IgG kappa multiple myeloma. She presented in 05/2012 with anemia, Cr 0.8, Ca 9.6; no bone lytic lesion; LDH 140; beta2- microglobulin 1.86 (ref 1.01-1.73). Positive SPEP for M-spike at 1.69, IgG 2080, kappa 7.24, kappa:lambda ratio 24.97; UPEP positive   06/08/2012 Bone Marrow Biopsy   Bone marrow biopsy on 06/08/12 showed 19% plasma cell; normal cytogenetics; myeloma FISH pending.    06/14/2012 Imaging   Skeletal survey negative for bone lytic lesions.  Lung nodule apparent.   09/17/2012 Bone Marrow Biopsy   Bone marrow biopsy showed 11% plasma cells.   10/18/2012 - 12/31/2012 Chemotherapy   Started on VRD (Velcade 1.3 mg/m^2 [place] days 1,8 and 15; Revlimide 25 mg PO daily, on day 1 through 14; Decadron 40 mg PO Days 1, 8, 15).   Started on coumadin given RD.   01/14/2013 Bone Marrow Biopsy   The bone marrow clot sections  are normocellular toslightly hypercellular for age (approximately 40% overall). 1% plasma cells.   02/08/2013 Bone Marrow Transplant   Auto Transplant at [facility] (Dr. Glorianne Manchester McIver).   11/30/2013 - 08/23/2014 Chemotherapy   Velcade maintence therapy to start today q 2 weeks, discontinued due to neuropathy   03/15/2014  Imaging   CT scan of the chest excluded pulmonary emboli   07/26/2014 Adverse Reaction   Velcade dose is reduced due to neuropathy   09/26/2014 - 02/23/2015 Chemotherapy   Treatment was switched to Ninlaro due to neuropathy   02/23/2015 Adverse Reaction   Ninlaro was stopped due to worsening neuropathy   10/30/2016 Imaging   The BMD measured at Femur Neck Left is 0.763 g/cm2 with a T-score of -2.0. 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 Lamonte Sakai)   07/08/2012 Pathology Results   Pathology consistent with Non-small cell lung cancer of RLL.    07/14/2012 Imaging   1. Right lower lobe nodule is non hypermetabolic which suggests benign etiology considering the slow growing rate in 6 years or non FDG avid disease as seen with carcinoid tumors. Pending biopsy. No PET evidence of mediastinal nodal disease.    08/02/2012 Imaging   MRI of brain negative.   08/12/2012 Surgery   Robotic Lobectomy of R Lower lobe.    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     Surgical resection margins are negative.     Two benign lymph nodes.     pTa1N0     REVIEW OF SYSTEMS:   Constitutional: Denies fevers, chills or abnormal weight loss Eyes: Denies blurriness of vision Ears, nose, mouth, throat, and face: Denies mucositis or sore throat Respiratory: Denies cough, dyspnea or wheezes Cardiovascular: Denies palpitation, chest discomfort or lower extremity swelling Gastrointestinal:  Denies nausea, heartburn or change in bowel habits Skin: Denies abnormal skin rashes Lymphatics: Denies new lymphadenopathy or easy bruising Neurological:Denies numbness, tingling or new weaknesses Behavioral/Psych: Mood is  stable, no new changes  All other systems were reviewed with the patient and are negative.  I have reviewed the past medical history, past surgical history, social history and family history with the patient and they are unchanged from previous note.  ALLERGIES:  has No Known Allergies.  MEDICATIONS:  Current Outpatient Medications  Medication Sig Dispense Refill  . buPROPion (WELLBUTRIN XL) 300 MG 24 hr tablet Take 300 mg by mouth daily.    . cholecalciferol (VITAMIN D) 1000 UNITS tablet Take 2,000 Units by mouth daily.    Marland Kitchen losartan (COZAAR) 50 MG tablet Take 50 mg by mouth daily.    . magnesium chloride (SLOW-MAG) 64 MG TBEC SR tablet Take 1 tablet by mouth 2 (two) times daily.     . metFORMIN (GLUCOPHAGE) 500 MG tablet Take 1 tablet (500 mg total) by mouth 2 (two) times daily with a meal. 60 tablet 0  . metoprolol succinate (TOPROL-XL) 50 MG 24 hr tablet Take 50 mg by mouth daily. Take with or immediately following a meal.    . Multiple Vitamins-Minerals (MULTIVITAMIN WITH MINERALS) tablet Take 1 tablet by mouth at bedtime.     . Multiple Vitamins-Minerals (PRESERVISION AREDS 2) CAPS Take 1 capsule by mouth 2 (two)  times daily.    . sertraline (ZOLOFT) 100 MG tablet Take 100 mg by mouth daily.     . simvastatin (ZOCOR) 40 MG tablet Take 40 mg by mouth at bedtime.      Current Facility-Administered Medications  Medication Dose Route Frequency Provider Last Rate Last Dose  . influenza vaccine adjuvanted (FLUAD) injection 0.5 mL  0.5 mL Intramuscular Once Alvy Bimler, Casmere Hollenbeck, MD        PHYSICAL EXAMINATION: ECOG PERFORMANCE STATUS: 0 - Asymptomatic  Vitals:   10/26/18 0837  BP: (!) 155/68  Pulse: 81  Resp: 18  Temp: 98 F (36.7 C)  SpO2: 95%   Filed Weights   10/26/18 0837  Weight: 204 lb 3.2 oz (92.6 kg)    GENERAL:alert, no distress and comfortable SKIN: skin color, texture, turgor are normal, no rashes or significant lesions EYES: Noted mild left eye redness OROPHARYNX:no  exudate, no erythema and lips, buccal mucosa, and tongue normal  NECK: supple, thyroid normal size, non-tender, without nodularity LYMPH:  no palpable lymphadenopathy in the cervical, axillary or inguinal LUNGS: clear to auscultation and percussion with normal breathing effort HEART: regular rate & rhythm and no murmurs and no lower extremity edema ABDOMEN:abdomen soft, non-tender and normal bowel sounds Musculoskeletal:no cyanosis of digits and no clubbing  NEURO: alert & oriented x 3 with fluent speech, no focal motor/sensory deficits  LABORATORY DATA:  I have reviewed the data as listed    Component Value Date/Time   NA 142 10/19/2018 1114   NA 141 12/05/2016 1447   K 4.7 10/19/2018 1114   K 4.3 12/05/2016 1447   CL 106 10/19/2018 1114   CL 105 06/08/2012 0913   CO2 25 10/19/2018 1114   CO2 23 12/05/2016 1447   GLUCOSE 124 (H) 10/19/2018 1114   GLUCOSE 131 12/05/2016 1447   GLUCOSE 120 (H) 06/08/2012 0913   BUN 19 10/19/2018 1114   BUN 17.0 12/05/2016 1447   CREATININE 0.88 10/19/2018 1114   CREATININE 0.8 12/05/2016 1447   CALCIUM 9.5 10/19/2018 1114   CALCIUM 9.1 12/05/2016 1447   PROT 6.6 10/19/2018 1114   PROT 6.8 12/05/2016 1447   ALBUMIN 3.9 10/19/2018 1114   ALBUMIN 3.7 12/05/2016 1447   AST 31 10/19/2018 1114   AST 27 12/05/2016 1447   ALT 41 10/19/2018 1114   ALT 43 12/05/2016 1447   ALKPHOS 84 10/19/2018 1114   ALKPHOS 85 12/05/2016 1447   BILITOT 0.4 10/19/2018 1114   BILITOT 0.68 12/05/2016 1447   GFRNONAA >60 10/19/2018 1114   GFRAA >60 10/19/2018 1114    No results found for: SPEP, UPEP  Lab Results  Component Value Date   WBC 4.0 10/19/2018   NEUTROABS 2.1 10/19/2018   HGB 11.8 (L) 10/19/2018   HCT 36.5 10/19/2018   MCV 98.1 10/19/2018   PLT 154 10/19/2018      Chemistry      Component Value Date/Time   NA 142 10/19/2018 1114   NA 141 12/05/2016 1447   K 4.7 10/19/2018 1114   K 4.3 12/05/2016 1447   CL 106 10/19/2018 1114   CL 105  06/08/2012 0913   CO2 25 10/19/2018 1114   CO2 23 12/05/2016 1447   BUN 19 10/19/2018 1114   BUN 17.0 12/05/2016 1447   CREATININE 0.88 10/19/2018 1114   CREATININE 0.8 12/05/2016 1447      Component Value Date/Time   CALCIUM 9.5 10/19/2018 1114   CALCIUM 9.1 12/05/2016 1447   ALKPHOS 84 10/19/2018 1114  ALKPHOS 85 12/05/2016 1447   AST 31 10/19/2018 1114   AST 27 12/05/2016 1447   ALT 41 10/19/2018 1114   ALT 43 12/05/2016 1447   BILITOT 0.4 10/19/2018 1114   BILITOT 0.68 12/05/2016 1447       All questions were answered. The patient knows to call the clinic with any problems, questions or concerns. No barriers to learning was detected.  I spent 15 minutes counseling the patient face to face. The total time spent in the appointment was 20 minutes and more than 50% was on counseling and review of test results  Heath Lark, MD 10/26/2018 8:55 AM

## 2018-10-27 ENCOUNTER — Telehealth: Payer: Self-pay | Admitting: Hematology and Oncology

## 2018-10-27 NOTE — Telephone Encounter (Signed)
I talk with patient regarding schedule  

## 2018-11-09 DIAGNOSIS — H903 Sensorineural hearing loss, bilateral: Secondary | ICD-10-CM | POA: Diagnosis not present

## 2018-11-22 ENCOUNTER — Encounter (INDEPENDENT_AMBULATORY_CARE_PROVIDER_SITE_OTHER): Payer: Medicare Other | Admitting: Ophthalmology

## 2018-11-22 DIAGNOSIS — H353221 Exudative age-related macular degeneration, left eye, with active choroidal neovascularization: Secondary | ICD-10-CM

## 2018-11-22 DIAGNOSIS — I10 Essential (primary) hypertension: Secondary | ICD-10-CM | POA: Diagnosis not present

## 2018-11-22 DIAGNOSIS — H353112 Nonexudative age-related macular degeneration, right eye, intermediate dry stage: Secondary | ICD-10-CM | POA: Diagnosis not present

## 2018-11-22 DIAGNOSIS — H43813 Vitreous degeneration, bilateral: Secondary | ICD-10-CM | POA: Diagnosis not present

## 2018-11-22 DIAGNOSIS — H35033 Hypertensive retinopathy, bilateral: Secondary | ICD-10-CM | POA: Diagnosis not present

## 2018-12-20 ENCOUNTER — Other Ambulatory Visit: Payer: Self-pay

## 2018-12-20 ENCOUNTER — Encounter (INDEPENDENT_AMBULATORY_CARE_PROVIDER_SITE_OTHER): Payer: Medicare Other | Admitting: Ophthalmology

## 2018-12-20 DIAGNOSIS — H43813 Vitreous degeneration, bilateral: Secondary | ICD-10-CM | POA: Diagnosis not present

## 2018-12-20 DIAGNOSIS — H35033 Hypertensive retinopathy, bilateral: Secondary | ICD-10-CM

## 2018-12-20 DIAGNOSIS — H353112 Nonexudative age-related macular degeneration, right eye, intermediate dry stage: Secondary | ICD-10-CM | POA: Diagnosis not present

## 2018-12-20 DIAGNOSIS — H353221 Exudative age-related macular degeneration, left eye, with active choroidal neovascularization: Secondary | ICD-10-CM

## 2018-12-20 DIAGNOSIS — I10 Essential (primary) hypertension: Secondary | ICD-10-CM

## 2019-01-17 ENCOUNTER — Encounter (INDEPENDENT_AMBULATORY_CARE_PROVIDER_SITE_OTHER): Payer: Medicare Other | Admitting: Ophthalmology

## 2019-02-02 ENCOUNTER — Encounter (INDEPENDENT_AMBULATORY_CARE_PROVIDER_SITE_OTHER): Payer: Medicare Other | Admitting: Ophthalmology

## 2019-02-02 DIAGNOSIS — H353221 Exudative age-related macular degeneration, left eye, with active choroidal neovascularization: Secondary | ICD-10-CM

## 2019-02-02 DIAGNOSIS — I10 Essential (primary) hypertension: Secondary | ICD-10-CM | POA: Diagnosis not present

## 2019-02-02 DIAGNOSIS — H353112 Nonexudative age-related macular degeneration, right eye, intermediate dry stage: Secondary | ICD-10-CM | POA: Diagnosis not present

## 2019-02-02 DIAGNOSIS — H35033 Hypertensive retinopathy, bilateral: Secondary | ICD-10-CM | POA: Diagnosis not present

## 2019-02-02 DIAGNOSIS — H43813 Vitreous degeneration, bilateral: Secondary | ICD-10-CM

## 2019-02-11 DIAGNOSIS — R911 Solitary pulmonary nodule: Secondary | ICD-10-CM | POA: Diagnosis not present

## 2019-02-11 DIAGNOSIS — R590 Localized enlarged lymph nodes: Secondary | ICD-10-CM | POA: Diagnosis not present

## 2019-02-11 DIAGNOSIS — C3431 Malignant neoplasm of lower lobe, right bronchus or lung: Secondary | ICD-10-CM | POA: Diagnosis not present

## 2019-02-11 DIAGNOSIS — R599 Enlarged lymph nodes, unspecified: Secondary | ICD-10-CM | POA: Diagnosis not present

## 2019-02-11 DIAGNOSIS — Z85118 Personal history of other malignant neoplasm of bronchus and lung: Secondary | ICD-10-CM | POA: Diagnosis not present

## 2019-02-11 DIAGNOSIS — Z902 Acquired absence of lung [part of]: Secondary | ICD-10-CM | POA: Diagnosis not present

## 2019-02-11 DIAGNOSIS — Z87891 Personal history of nicotine dependence: Secondary | ICD-10-CM | POA: Diagnosis not present

## 2019-02-11 DIAGNOSIS — Z79899 Other long term (current) drug therapy: Secondary | ICD-10-CM | POA: Diagnosis not present

## 2019-02-15 ENCOUNTER — Ambulatory Visit: Payer: Medicare Other | Attending: Internal Medicine

## 2019-02-15 DIAGNOSIS — Z23 Encounter for immunization: Secondary | ICD-10-CM | POA: Insufficient documentation

## 2019-02-15 NOTE — Progress Notes (Signed)
   Covid-19 Vaccination Clinic  Name:  [name]    MRN: [id number] DOB: [birth year]  02/15/2019  [name] was observed post Covid-19 immunization for 15 minutes without incidence. She was provided with Vaccine Information Sheet and instruction to access the V-Safe system.   [name] was instructed to call 911 with any severe reactions post vaccine: Marland Kitchen Difficulty breathing  . Swelling of your face and throat  . A fast heartbeat  . A bad rash all over your body  . Dizziness and weakness    Immunizations Administered    Name Date Dose VIS Date Route   Pfizer COVID-19 Vaccine 02/15/2019  5:57 PM 0.3 mL 01/07/2019 Intramuscular   Manufacturer: [place]   Lot: [id number]   [place]: [id number]

## 2019-02-18 ENCOUNTER — Inpatient Hospital Stay: Payer: Medicare Other | Attending: Hematology and Oncology

## 2019-02-18 ENCOUNTER — Other Ambulatory Visit: Payer: Self-pay

## 2019-02-18 ENCOUNTER — Other Ambulatory Visit: Payer: Self-pay | Admitting: *Deleted

## 2019-02-18 DIAGNOSIS — C9001 Multiple myeloma in remission: Secondary | ICD-10-CM | POA: Diagnosis not present

## 2019-02-18 DIAGNOSIS — M8580 Other specified disorders of bone density and structure, unspecified site: Secondary | ICD-10-CM | POA: Insufficient documentation

## 2019-02-18 DIAGNOSIS — Z7984 Long term (current) use of oral hypoglycemic drugs: Secondary | ICD-10-CM | POA: Diagnosis not present

## 2019-02-18 DIAGNOSIS — Z85110 Personal history of malignant carcinoid tumor of bronchus and lung: Secondary | ICD-10-CM | POA: Diagnosis not present

## 2019-02-18 DIAGNOSIS — Z9221 Personal history of antineoplastic chemotherapy: Secondary | ICD-10-CM | POA: Insufficient documentation

## 2019-02-18 LAB — CBC WITH DIFFERENTIAL (CANCER CENTER ONLY)
Abs Immature Granulocytes: 0.01 10*3/uL (ref 0.00–0.07)
Basophils Absolute: 0 10*3/uL (ref 0.0–0.1)
Basophils Relative: 1 %
Eosinophils Absolute: 0.1 10*3/uL (ref 0.0–0.5)
Eosinophils Relative: 2 %
HCT: 37.3 % (ref 36.0–46.0)
Hemoglobin: 12.1 g/dL (ref 12.0–15.0)
Immature Granulocytes: 0 %
Lymphocytes Relative: 36 %
Lymphs Abs: 1.3 10*3/uL (ref 0.7–4.0)
MCH: 31.7 pg (ref 26.0–34.0)
MCHC: 32.4 g/dL (ref 30.0–36.0)
MCV: 97.6 fL (ref 80.0–100.0)
Monocytes Absolute: 0.3 10*3/uL (ref 0.1–1.0)
Monocytes Relative: 9 %
Neutro Abs: 1.9 10*3/uL (ref 1.7–7.7)
Neutrophils Relative %: 52 %
Platelet Count: 163 10*3/uL (ref 150–400)
RBC: 3.82 MIL/uL — ABNORMAL LOW (ref 3.87–5.11)
RDW: 14.1 % (ref 11.5–15.5)
WBC Count: 3.6 10*3/uL — ABNORMAL LOW (ref 4.0–10.5)
nRBC: 0 % (ref 0.0–0.2)

## 2019-02-18 LAB — CMP (CANCER CENTER ONLY)
ALT: 53 U/L — ABNORMAL HIGH (ref 0–44)
AST: 44 U/L — ABNORMAL HIGH (ref 15–41)
Albumin: 3.8 g/dL (ref 3.5–5.0)
Alkaline Phosphatase: 88 U/L (ref 38–126)
Anion gap: 10 (ref 5–15)
BUN: 22 mg/dL (ref 8–23)
CO2: 25 mmol/L (ref 22–32)
Calcium: 9.6 mg/dL (ref 8.9–10.3)
Chloride: 105 mmol/L (ref 98–111)
Creatinine: 0.86 mg/dL (ref 0.44–1.00)
GFR, Est AFR Am: >60 mL/min (ref >60)
GFR, Estimated: >60 mL/min (ref >60)
Glucose, Bld: 155 mg/dL — ABNORMAL HIGH (ref 70–99)
Potassium: 4.5 mmol/L (ref 3.5–5.1)
Sodium: 140 mmol/L (ref 135–145)
Total Bilirubin: 0.5 mg/dL (ref 0.3–1.2)
Total Protein: 7 g/dL (ref 6.5–8.1)

## 2019-02-21 LAB — KAPPA/LAMBDA LIGHT CHAINS
Kappa free light chain: 17.6 mg/L (ref 3.3–19.4)
Kappa, lambda light chain ratio: 1.27 (ref 0.26–1.65)
Lambda free light chains: 13.9 mg/L (ref 5.7–26.3)

## 2019-02-22 LAB — MULTIPLE MYELOMA PANEL, SERUM
Albumin SerPl Elph-Mcnc: 3.7 g/dL (ref 2.9–4.4)
Albumin/Glob SerPl: 1.4 (ref 0.7–1.7)
Alpha 1: 0.2 g/dL (ref 0.0–0.4)
Alpha2 Glob SerPl Elph-Mcnc: 0.7 g/dL (ref 0.4–1.0)
B-Globulin SerPl Elph-Mcnc: 1.1 g/dL (ref 0.7–1.3)
Gamma Glob SerPl Elph-Mcnc: 0.8 g/dL (ref 0.4–1.8)
Globulin, Total: 2.8 g/dL (ref 2.2–3.9)
IgA: 193 mg/dL (ref 64–422)
IgG (Immunoglobin G), Serum: 870 mg/dL (ref 586–1602)
IgM (Immunoglobulin M), Srm: 39 mg/dL (ref 26–217)
Total Protein ELP: 6.5 g/dL (ref 6.0–8.5)

## 2019-02-25 ENCOUNTER — Inpatient Hospital Stay (HOSPITAL_BASED_OUTPATIENT_CLINIC_OR_DEPARTMENT_OTHER): Payer: Medicare Other | Admitting: Hematology and Oncology

## 2019-02-25 ENCOUNTER — Other Ambulatory Visit: Payer: Self-pay

## 2019-02-25 ENCOUNTER — Encounter: Payer: Self-pay | Admitting: Hematology and Oncology

## 2019-02-25 VITALS — BP 148/69 | HR 74 | Temp 98.0°F | Resp 18 | Ht 65.0 in | Wt 202.4 lb

## 2019-02-25 DIAGNOSIS — Z7984 Long term (current) use of oral hypoglycemic drugs: Secondary | ICD-10-CM | POA: Diagnosis not present

## 2019-02-25 DIAGNOSIS — D3A090 Benign carcinoid tumor of the bronchus and lung: Secondary | ICD-10-CM

## 2019-02-25 DIAGNOSIS — M8580 Other specified disorders of bone density and structure, unspecified site: Secondary | ICD-10-CM | POA: Diagnosis not present

## 2019-02-25 DIAGNOSIS — Z85110 Personal history of malignant carcinoid tumor of bronchus and lung: Secondary | ICD-10-CM | POA: Diagnosis not present

## 2019-02-25 DIAGNOSIS — C9001 Multiple myeloma in remission: Secondary | ICD-10-CM | POA: Diagnosis not present

## 2019-02-25 DIAGNOSIS — Z9221 Personal history of antineoplastic chemotherapy: Secondary | ICD-10-CM | POA: Diagnosis not present

## 2019-02-25 NOTE — Assessment & Plan Note (Signed)
She had recent evaluation at [facility] CT imaging show no evidence of recurrence I will defer to them for further follow-up

## 2019-02-25 NOTE — Progress Notes (Signed)
[place] OFFICE PROGRESS NOTE  Patient Care Team: Maurice Small, MD as PCP - General (Family Medicine) Laurence Spates, MD as Attending Physician (Gastroenterology) Jerrell Belfast, MD as Attending Physician (Otolaryngology) Collene Gobble, MD as Attending Physician (Pulmonary Disease) Casey, Glorianne Manchester, DO as Consulting Physician (Hematology)  ASSESSMENT & PLAN:  Multiple myeloma in remission She stopped Ninlaro in March 2017 Recent blood work showed that she continues to remain in remission We have stopped Zometa after numerous doses; recent bone density scan show mild osteopenia only. She will continue calcium with vitamin D supplement. She agreed with the plan of care. I will see her back in 6 months for further assessment    Carcinoid tumor of left lung She had recent evaluation at [facility] CT imaging show no evidence of recurrence I will defer to them for further follow-up    Orders Placed This Encounter  Procedures  . Comprehensive metabolic panel    Standing Status:   Future    Standing Expiration Date:   03/31/2020  . CBC with Differential/Platelet    Standing Status:   Future    Standing Expiration Date:   03/31/2020  . Multiple Myeloma Panel (SPEP&IFE w/QIG)    Standing Status:   Future    Standing Expiration Date:   03/31/2020  . Kappa/lambda light chains    Standing Status:   Future    Standing Expiration Date:   03/31/2020    All questions were answered. The patient knows to call the clinic with any problems, questions or concerns. The total time spent in the appointment was 15 minutes encounter with patients including review of chart and various tests results, discussions about plan of care and coordination of care plan   Heath Lark, MD 02/25/2019 1:18 PM  INTERVAL HISTORY: Please see below for problem oriented charting. She returns for further follow-up She had recent evaluation at [facility] CT imaging was stable She had no  recent infection, fever or chills No new bone pain  SUMMARY OF ONCOLOGIC HISTORY: Oncology History  Multiple myeloma in remission ([place])  05/27/2012 Initial Diagnosis   Multiple myeloma, without mention of having achieved remission(203.00)   05/27/2012 Cancer Staging   1.  IgG kappa multiple myeloma. She presented in 05/2012 with anemia, Cr 0.8, Ca 9.6; no bone lytic lesion; LDH 140; beta2- microglobulin 1.86 (ref 1.01-1.73). Positive SPEP for M-spike at 1.69, IgG 2080, kappa 7.24, kappa:lambda ratio 24.97; UPEP positive   06/08/2012 Bone Marrow Biopsy   Bone marrow biopsy on 06/08/12 showed 19% plasma cell; normal cytogenetics; myeloma FISH pending.    06/14/2012 Imaging   Skeletal survey negative for bone lytic lesions.  Lung nodule apparent.   09/17/2012 Bone Marrow Biopsy   Bone marrow biopsy showed 11% plasma cells.   10/18/2012 - 12/31/2012 Chemotherapy   Started on VRD (Velcade 1.3 mg/m^2 [place] days 1,8 and 15; Revlimide 25 mg PO daily, on day 1 through 14; Decadron 40 mg PO Days 1, 8, 15).   Started on coumadin given RD.   01/14/2013 Bone Marrow Biopsy   The bone marrow clot sections are normocellular toslightly hypercellular for age (approximately 40% overall). 1% plasma cells.   02/08/2013 Bone Marrow Transplant   Auto Transplant at [facility] (Dr. Glorianne Manchester McIver).   11/30/2013 - 08/23/2014 Chemotherapy   Velcade maintence therapy to start today q 2 weeks, discontinued due to neuropathy   03/15/2014 Imaging   CT scan of the chest excluded pulmonary emboli   07/26/2014 Adverse  Reaction   Velcade dose is reduced due to neuropathy   09/26/2014 - 02/23/2015 Chemotherapy   Treatment was switched to Ninlaro due to neuropathy   02/23/2015 Adverse Reaction   Ninlaro was stopped due to worsening neuropathy   10/30/2016 Imaging   The BMD measured at Femur Neck Left is 0.763 g/cm2 with a T-score of -2.0. 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 Lamonte Sakai)   07/08/2012 Pathology Results   Pathology consistent with Non-small cell lung cancer of RLL.    07/14/2012 Imaging   1. Right lower lobe nodule is non hypermetabolic which suggests benign etiology considering the slow growing rate in 6 years or non FDG avid disease as seen with carcinoid tumors. Pending biopsy. No PET evidence of mediastinal nodal disease.    08/02/2012 Imaging   MRI of brain negative.   08/12/2012 Surgery   Robotic Lobectomy of R Lower lobe.    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     Surgical resection margins are negative.     Two benign lymph nodes.     pTa1N0     REVIEW OF SYSTEMS:   Constitutional: Denies fevers, chills or abnormal weight loss Eyes: Denies blurriness of vision Ears, nose, mouth, throat, and face: Denies mucositis or sore throat Respiratory: Denies cough, dyspnea or wheezes Cardiovascular: Denies palpitation, chest discomfort or lower extremity swelling Gastrointestinal:  Denies nausea, heartburn or change in bowel habits Skin: Denies abnormal skin rashes Lymphatics: Denies new lymphadenopathy or easy bruising Neurological:Denies numbness, tingling or new weaknesses Behavioral/Psych: Mood is stable, no new changes  All other systems were reviewed with the patient and are negative.  I have reviewed the past medical history, past surgical history, social history and family history with the patient and they are unchanged from previous note.  ALLERGIES:  has No Known Allergies.  MEDICATIONS:  Current Outpatient Medications  Medication Sig Dispense Refill  . buPROPion (WELLBUTRIN XL) 300 MG 24 hr tablet Take 300 mg by mouth daily.    . cholecalciferol (VITAMIN D) 1000  UNITS tablet Take 2,000 Units by mouth daily.    Marland Kitchen losartan (COZAAR) 50 MG tablet Take 50 mg by mouth daily.    . magnesium chloride (SLOW-MAG) 64 MG TBEC SR tablet Take 1 tablet by mouth 2 (two) times daily.     . metFORMIN (GLUCOPHAGE) 500 MG tablet Take 1 tablet (500 mg total) by mouth 2 (two) times daily with a meal. 60 tablet 0  . metoprolol succinate (TOPROL-XL) 50 MG 24 hr tablet Take 50 mg by mouth daily. Take with or immediately following a meal.    . Multiple Vitamins-Minerals (MULTIVITAMIN WITH MINERALS) tablet Take 1 tablet by mouth at bedtime.     . Multiple Vitamins-Minerals (PRESERVISION AREDS 2) CAPS Take 1 capsule by mouth 2 (two) times daily.    . sertraline (ZOLOFT) 100 MG tablet Take 100 mg by mouth daily.     . simvastatin (ZOCOR) 40 MG tablet Take 40 mg by mouth at bedtime.      No current facility-administered medications for this visit.    PHYSICAL EXAMINATION: ECOG PERFORMANCE STATUS: 0 - Asymptomatic  Vitals:   02/25/19 1205  BP: (!) 148/69  Pulse: 74  Resp: 18  Temp: 98 F (36.7 C)  SpO2: 96%   Filed Weights   02/25/19 1205  Weight: 202 lb 6.4 oz (91.8 kg)    GENERAL:alert, no distress and comfortable NEURO: alert & oriented x 3 with fluent speech, no focal motor/sensory deficits  LABORATORY DATA:  I have reviewed the data as listed    Component Value Date/Time   NA 140 02/18/2019 1123   NA 141 12/05/2016 1447   K 4.5 02/18/2019 1123   K 4.3 12/05/2016 1447   CL 105 02/18/2019 1123   CL 105 06/08/2012 0913   CO2 25 02/18/2019 1123   CO2 23 12/05/2016 1447   GLUCOSE 155 (H) 02/18/2019 1123   GLUCOSE 131 12/05/2016 1447   GLUCOSE 120 (H) 06/08/2012 0913   BUN 22 02/18/2019 1123   BUN 17.0 12/05/2016 1447   CREATININE 0.86 02/18/2019 1123   CREATININE 0.8 12/05/2016 1447   CALCIUM 9.6 02/18/2019 1123   CALCIUM 9.1 12/05/2016 1447   PROT 7.0 02/18/2019 1123   PROT 6.8 12/05/2016 1447   PROT 6.3 12/05/2016 1446   ALBUMIN 3.8 02/18/2019  1123   ALBUMIN 3.7 12/05/2016 1447   AST 44 (H) 02/18/2019 1123   AST 27 12/05/2016 1447   ALT 53 (H) 02/18/2019 1123   ALT 43 12/05/2016 1447   ALKPHOS 88 02/18/2019 1123   ALKPHOS 85 12/05/2016 1447   BILITOT 0.5 02/18/2019 1123   BILITOT 0.68 12/05/2016 1447   GFRNONAA >60 02/18/2019 1123   GFRAA >60 02/18/2019 1123    No results found for: SPEP, UPEP  Lab Results  Component Value Date   WBC 3.6 (L) 02/18/2019   NEUTROABS 1.9 02/18/2019   HGB 12.1 02/18/2019   HCT 37.3 02/18/2019   MCV 97.6 02/18/2019   PLT 163 02/18/2019      Chemistry      Component Value Date/Time   NA 140 02/18/2019 1123   NA 141 12/05/2016 1447   K 4.5 02/18/2019 1123   K 4.3 12/05/2016 1447   CL 105 02/18/2019 1123   CL 105 06/08/2012 0913   CO2 25 02/18/2019 1123   CO2 23 12/05/2016 1447   BUN 22 02/18/2019 1123   BUN 17.0 12/05/2016 1447   CREATININE 0.86 02/18/2019 1123   CREATININE 0.8 12/05/2016 1447      Component Value Date/Time   CALCIUM 9.6 02/18/2019 1123   CALCIUM 9.1 12/05/2016 1447   ALKPHOS 88 02/18/2019 1123   ALKPHOS 85 12/05/2016 1447   AST 44 (H) 02/18/2019 1123   AST 27 12/05/2016 1447   ALT 53 (H) 02/18/2019 1123   ALT 43 12/05/2016 1447   BILITOT 0.5 02/18/2019 1123   BILITOT 0.68 12/05/2016 1447

## 2019-02-25 NOTE — Assessment & Plan Note (Signed)
She stopped Ninlaro in March 2017 Recent blood work showed that she continues to remain in remission We have stopped Zometa after numerous doses; recent bone density scan show mild osteopenia only. She will continue calcium with vitamin D supplement. She agreed with the plan of care. I will see her back in 6 months for further assessment

## 2019-03-01 ENCOUNTER — Other Ambulatory Visit: Payer: Self-pay

## 2019-03-01 ENCOUNTER — Encounter (INDEPENDENT_AMBULATORY_CARE_PROVIDER_SITE_OTHER): Payer: Medicare Other | Admitting: Ophthalmology

## 2019-03-01 DIAGNOSIS — H35033 Hypertensive retinopathy, bilateral: Secondary | ICD-10-CM | POA: Diagnosis not present

## 2019-03-01 DIAGNOSIS — H353221 Exudative age-related macular degeneration, left eye, with active choroidal neovascularization: Secondary | ICD-10-CM

## 2019-03-01 DIAGNOSIS — H353112 Nonexudative age-related macular degeneration, right eye, intermediate dry stage: Secondary | ICD-10-CM | POA: Diagnosis not present

## 2019-03-01 DIAGNOSIS — H43813 Vitreous degeneration, bilateral: Secondary | ICD-10-CM | POA: Diagnosis not present

## 2019-03-01 DIAGNOSIS — I10 Essential (primary) hypertension: Secondary | ICD-10-CM | POA: Diagnosis not present

## 2019-03-02 DIAGNOSIS — N390 Urinary tract infection, site not specified: Secondary | ICD-10-CM | POA: Diagnosis not present

## 2019-03-02 DIAGNOSIS — R399 Unspecified symptoms and signs involving the genitourinary system: Secondary | ICD-10-CM | POA: Diagnosis not present

## 2019-03-04 ENCOUNTER — Telehealth: Payer: Self-pay | Admitting: Hematology and Oncology

## 2019-03-04 DIAGNOSIS — R399 Unspecified symptoms and signs involving the genitourinary system: Secondary | ICD-10-CM | POA: Diagnosis not present

## 2019-03-04 NOTE — Telephone Encounter (Signed)
I talk with patient regarding schedule  

## 2019-03-07 ENCOUNTER — Ambulatory Visit: Payer: Medicare Other | Attending: Internal Medicine

## 2019-03-07 DIAGNOSIS — Z23 Encounter for immunization: Secondary | ICD-10-CM | POA: Insufficient documentation

## 2019-03-07 NOTE — Progress Notes (Signed)
   Covid-19 Vaccination Clinic  Name:  [name]    MRN: [id number] DOB: Nov 10, 1945  03/07/2019  [name] was observed post Covid-19 immunization for 15 minutes without incidence. She was provided with Vaccine Information Sheet and instruction to access the V-Safe system.   [name] was instructed to call 911 with any severe reactions post vaccine: Marland Kitchen Difficulty breathing  . Swelling of your face and throat  . A fast heartbeat  . A bad rash all over your body  . Dizziness and weakness    Immunizations Administered    Name Date Dose VIS Date Route   Pfizer COVID-19 Vaccine 03/07/2019 10:18 AM 0.3 mL 01/07/2019 Intramuscular   Manufacturer: [place]   Lot: [id number]   [place]: [id number]

## 2019-03-29 ENCOUNTER — Encounter (INDEPENDENT_AMBULATORY_CARE_PROVIDER_SITE_OTHER): Payer: Medicare Other | Admitting: Ophthalmology

## 2019-03-29 DIAGNOSIS — H35033 Hypertensive retinopathy, bilateral: Secondary | ICD-10-CM

## 2019-03-29 DIAGNOSIS — H353221 Exudative age-related macular degeneration, left eye, with active choroidal neovascularization: Secondary | ICD-10-CM

## 2019-03-29 DIAGNOSIS — H353112 Nonexudative age-related macular degeneration, right eye, intermediate dry stage: Secondary | ICD-10-CM

## 2019-03-29 DIAGNOSIS — I10 Essential (primary) hypertension: Secondary | ICD-10-CM | POA: Diagnosis not present

## 2019-03-29 DIAGNOSIS — H43813 Vitreous degeneration, bilateral: Secondary | ICD-10-CM

## 2019-04-18 DIAGNOSIS — Z20828 Contact with and (suspected) exposure to other viral communicable diseases: Secondary | ICD-10-CM | POA: Diagnosis not present

## 2019-05-02 DIAGNOSIS — R399 Unspecified symptoms and signs involving the genitourinary system: Secondary | ICD-10-CM | POA: Diagnosis not present

## 2019-05-02 DIAGNOSIS — Z23 Encounter for immunization: Secondary | ICD-10-CM | POA: Diagnosis not present

## 2019-05-02 DIAGNOSIS — M779 Enthesopathy, unspecified: Secondary | ICD-10-CM | POA: Diagnosis not present

## 2019-05-02 DIAGNOSIS — L989 Disorder of the skin and subcutaneous tissue, unspecified: Secondary | ICD-10-CM | POA: Diagnosis not present

## 2019-05-03 ENCOUNTER — Other Ambulatory Visit: Payer: Self-pay

## 2019-05-03 ENCOUNTER — Encounter (INDEPENDENT_AMBULATORY_CARE_PROVIDER_SITE_OTHER): Payer: Medicare Other | Admitting: Ophthalmology

## 2019-05-03 DIAGNOSIS — I10 Essential (primary) hypertension: Secondary | ICD-10-CM

## 2019-05-03 DIAGNOSIS — H353112 Nonexudative age-related macular degeneration, right eye, intermediate dry stage: Secondary | ICD-10-CM | POA: Diagnosis not present

## 2019-05-03 DIAGNOSIS — H35033 Hypertensive retinopathy, bilateral: Secondary | ICD-10-CM | POA: Diagnosis not present

## 2019-05-03 DIAGNOSIS — H43813 Vitreous degeneration, bilateral: Secondary | ICD-10-CM

## 2019-05-03 DIAGNOSIS — H353221 Exudative age-related macular degeneration, left eye, with active choroidal neovascularization: Secondary | ICD-10-CM | POA: Diagnosis not present

## 2019-06-03 ENCOUNTER — Encounter (INDEPENDENT_AMBULATORY_CARE_PROVIDER_SITE_OTHER): Payer: Medicare Other | Admitting: Ophthalmology

## 2019-06-03 DIAGNOSIS — H35033 Hypertensive retinopathy, bilateral: Secondary | ICD-10-CM | POA: Diagnosis not present

## 2019-06-03 DIAGNOSIS — H43813 Vitreous degeneration, bilateral: Secondary | ICD-10-CM

## 2019-06-03 DIAGNOSIS — H353221 Exudative age-related macular degeneration, left eye, with active choroidal neovascularization: Secondary | ICD-10-CM

## 2019-06-03 DIAGNOSIS — H353112 Nonexudative age-related macular degeneration, right eye, intermediate dry stage: Secondary | ICD-10-CM

## 2019-06-03 DIAGNOSIS — I10 Essential (primary) hypertension: Secondary | ICD-10-CM

## 2019-06-08 ENCOUNTER — Encounter (INDEPENDENT_AMBULATORY_CARE_PROVIDER_SITE_OTHER): Payer: Medicare Other | Admitting: Ophthalmology

## 2019-06-14 DIAGNOSIS — D230 Other benign neoplasm of skin of lip: Secondary | ICD-10-CM | POA: Diagnosis not present

## 2019-07-08 ENCOUNTER — Encounter (INDEPENDENT_AMBULATORY_CARE_PROVIDER_SITE_OTHER): Payer: Medicare Other | Admitting: Ophthalmology

## 2019-07-08 ENCOUNTER — Other Ambulatory Visit: Payer: Self-pay

## 2019-07-08 DIAGNOSIS — I10 Essential (primary) hypertension: Secondary | ICD-10-CM | POA: Diagnosis not present

## 2019-07-08 DIAGNOSIS — H353221 Exudative age-related macular degeneration, left eye, with active choroidal neovascularization: Secondary | ICD-10-CM | POA: Diagnosis not present

## 2019-07-08 DIAGNOSIS — H353112 Nonexudative age-related macular degeneration, right eye, intermediate dry stage: Secondary | ICD-10-CM | POA: Diagnosis not present

## 2019-07-08 DIAGNOSIS — H43813 Vitreous degeneration, bilateral: Secondary | ICD-10-CM

## 2019-07-08 DIAGNOSIS — H35033 Hypertensive retinopathy, bilateral: Secondary | ICD-10-CM | POA: Diagnosis not present

## 2019-07-08 DIAGNOSIS — H2513 Age-related nuclear cataract, bilateral: Secondary | ICD-10-CM

## 2019-08-11 ENCOUNTER — Encounter (INDEPENDENT_AMBULATORY_CARE_PROVIDER_SITE_OTHER): Payer: Medicare Other | Admitting: Ophthalmology

## 2019-08-12 DIAGNOSIS — Z85118 Personal history of other malignant neoplasm of bronchus and lung: Secondary | ICD-10-CM | POA: Diagnosis not present

## 2019-08-12 DIAGNOSIS — R599 Enlarged lymph nodes, unspecified: Secondary | ICD-10-CM | POA: Diagnosis not present

## 2019-08-12 DIAGNOSIS — R918 Other nonspecific abnormal finding of lung field: Secondary | ICD-10-CM | POA: Diagnosis not present

## 2019-08-12 DIAGNOSIS — Z08 Encounter for follow-up examination after completed treatment for malignant neoplasm: Secondary | ICD-10-CM | POA: Diagnosis not present

## 2019-08-12 DIAGNOSIS — Z902 Acquired absence of lung [part of]: Secondary | ICD-10-CM | POA: Diagnosis not present

## 2019-08-15 ENCOUNTER — Other Ambulatory Visit: Payer: Self-pay

## 2019-08-15 ENCOUNTER — Encounter (INDEPENDENT_AMBULATORY_CARE_PROVIDER_SITE_OTHER): Payer: Medicare Other | Admitting: Ophthalmology

## 2019-08-15 DIAGNOSIS — H2513 Age-related nuclear cataract, bilateral: Secondary | ICD-10-CM | POA: Diagnosis not present

## 2019-08-15 DIAGNOSIS — H43813 Vitreous degeneration, bilateral: Secondary | ICD-10-CM

## 2019-08-15 DIAGNOSIS — I10 Essential (primary) hypertension: Secondary | ICD-10-CM

## 2019-08-15 DIAGNOSIS — H353221 Exudative age-related macular degeneration, left eye, with active choroidal neovascularization: Secondary | ICD-10-CM | POA: Diagnosis not present

## 2019-08-15 DIAGNOSIS — H353112 Nonexudative age-related macular degeneration, right eye, intermediate dry stage: Secondary | ICD-10-CM | POA: Diagnosis not present

## 2019-08-15 DIAGNOSIS — H35033 Hypertensive retinopathy, bilateral: Secondary | ICD-10-CM

## 2019-08-16 ENCOUNTER — Inpatient Hospital Stay: Payer: Medicare Other

## 2019-08-17 ENCOUNTER — Other Ambulatory Visit: Payer: Self-pay

## 2019-08-17 ENCOUNTER — Inpatient Hospital Stay: Payer: Medicare Other | Attending: Hematology and Oncology

## 2019-08-17 DIAGNOSIS — E669 Obesity, unspecified: Secondary | ICD-10-CM | POA: Diagnosis not present

## 2019-08-17 DIAGNOSIS — Z9221 Personal history of antineoplastic chemotherapy: Secondary | ICD-10-CM | POA: Diagnosis not present

## 2019-08-17 DIAGNOSIS — Z79899 Other long term (current) drug therapy: Secondary | ICD-10-CM | POA: Insufficient documentation

## 2019-08-17 DIAGNOSIS — Z85110 Personal history of malignant carcinoid tumor of bronchus and lung: Secondary | ICD-10-CM | POA: Insufficient documentation

## 2019-08-17 DIAGNOSIS — C9001 Multiple myeloma in remission: Secondary | ICD-10-CM | POA: Insufficient documentation

## 2019-08-17 DIAGNOSIS — Z7984 Long term (current) use of oral hypoglycemic drugs: Secondary | ICD-10-CM | POA: Insufficient documentation

## 2019-08-17 DIAGNOSIS — Z9484 Stem cells transplant status: Secondary | ICD-10-CM | POA: Diagnosis not present

## 2019-08-17 DIAGNOSIS — Z23 Encounter for immunization: Secondary | ICD-10-CM | POA: Diagnosis not present

## 2019-08-17 DIAGNOSIS — M8580 Other specified disorders of bone density and structure, unspecified site: Secondary | ICD-10-CM | POA: Insufficient documentation

## 2019-08-17 LAB — CBC WITH DIFFERENTIAL/PLATELET
Abs Immature Granulocytes: 0.01 10*3/uL (ref 0.00–0.07)
Basophils Absolute: 0 10*3/uL (ref 0.0–0.1)
Basophils Relative: 1 %
Eosinophils Absolute: 0.1 10*3/uL (ref 0.0–0.5)
Eosinophils Relative: 2 %
HCT: 37.3 % (ref 36.0–46.0)
Hemoglobin: 11.9 g/dL — ABNORMAL LOW (ref 12.0–15.0)
Immature Granulocytes: 0 %
Lymphocytes Relative: 35 %
Lymphs Abs: 1.3 10*3/uL (ref 0.7–4.0)
MCH: 31.8 pg (ref 26.0–34.0)
MCHC: 31.9 g/dL (ref 30.0–36.0)
MCV: 99.7 fL (ref 80.0–100.0)
Monocytes Absolute: 0.3 10*3/uL (ref 0.1–1.0)
Monocytes Relative: 9 %
Neutro Abs: 2.1 10*3/uL (ref 1.7–7.7)
Neutrophils Relative %: 53 %
Platelets: 147 10*3/uL — ABNORMAL LOW (ref 150–400)
RBC: 3.74 MIL/uL — ABNORMAL LOW (ref 3.87–5.11)
RDW: 13.6 % (ref 11.5–15.5)
WBC: 3.9 10*3/uL — ABNORMAL LOW (ref 4.0–10.5)
nRBC: 0 % (ref 0.0–0.2)

## 2019-08-17 LAB — COMPREHENSIVE METABOLIC PANEL
ALT: 57 U/L — ABNORMAL HIGH (ref 0–44)
AST: 46 U/L — ABNORMAL HIGH (ref 15–41)
Albumin: 3.7 g/dL (ref 3.5–5.0)
Alkaline Phosphatase: 91 U/L (ref 38–126)
Anion gap: 11 (ref 5–15)
BUN: 15 mg/dL (ref 8–23)
CO2: 24 mmol/L (ref 22–32)
Calcium: 10 mg/dL (ref 8.9–10.3)
Chloride: 106 mmol/L (ref 98–111)
Creatinine, Ser: 0.88 mg/dL (ref 0.44–1.00)
GFR calc Af Amer: >60 mL/min (ref >60)
GFR calc non Af Amer: >60 mL/min (ref >60)
Glucose, Bld: 161 mg/dL — ABNORMAL HIGH (ref 70–99)
Potassium: 4.2 mmol/L (ref 3.5–5.1)
Sodium: 141 mmol/L (ref 135–145)
Total Bilirubin: 0.4 mg/dL (ref 0.3–1.2)
Total Protein: 6.9 g/dL (ref 6.5–8.1)

## 2019-08-18 DIAGNOSIS — K130 Diseases of lips: Secondary | ICD-10-CM | POA: Diagnosis not present

## 2019-08-18 LAB — KAPPA/LAMBDA LIGHT CHAINS
Kappa free light chain: 17.6 mg/L (ref 3.3–19.4)
Kappa, lambda light chain ratio: 1.28 (ref 0.26–1.65)
Lambda free light chains: 13.8 mg/L (ref 5.7–26.3)

## 2019-08-19 ENCOUNTER — Telehealth: Payer: Self-pay

## 2019-08-19 NOTE — Telephone Encounter (Signed)
She called and left a message. She got a call from [place] recently that never got the MMR vaccine after her transplant. She is asking for the vaccine when she comes on 7/27. Injection appt schedule at 1200 after MD appt.

## 2019-08-22 LAB — MULTIPLE MYELOMA PANEL, SERUM
Albumin SerPl Elph-Mcnc: 3.6 g/dL (ref 2.9–4.4)
Albumin/Glob SerPl: 1.4 (ref 0.7–1.7)
Alpha 1: 0.2 g/dL (ref 0.0–0.4)
Alpha2 Glob SerPl Elph-Mcnc: 0.6 g/dL (ref 0.4–1.0)
B-Globulin SerPl Elph-Mcnc: 1.1 g/dL (ref 0.7–1.3)
Gamma Glob SerPl Elph-Mcnc: 0.8 g/dL (ref 0.4–1.8)
Globulin, Total: 2.7 g/dL (ref 2.2–3.9)
IgA: 186 mg/dL (ref 64–422)
IgG (Immunoglobin G), Serum: 857 mg/dL (ref 586–1602)
IgM (Immunoglobulin M), Srm: 39 mg/dL (ref 26–217)
Total Protein ELP: 6.3 g/dL (ref 6.0–8.5)

## 2019-08-22 NOTE — Telephone Encounter (Signed)
Called pharmacy. They have injection in stock.

## 2019-08-22 NOTE — Telephone Encounter (Signed)
Please call pharmacy and see if they can have it ordered It is not routinely stocked

## 2019-08-23 ENCOUNTER — Encounter: Payer: Self-pay | Admitting: Hematology and Oncology

## 2019-08-23 ENCOUNTER — Inpatient Hospital Stay (HOSPITAL_BASED_OUTPATIENT_CLINIC_OR_DEPARTMENT_OTHER): Payer: Medicare Other | Admitting: Hematology and Oncology

## 2019-08-23 ENCOUNTER — Inpatient Hospital Stay: Payer: Medicare Other

## 2019-08-23 ENCOUNTER — Other Ambulatory Visit: Payer: Self-pay

## 2019-08-23 VITALS — BP 138/62 | HR 66 | Temp 98.2°F | Resp 18 | Ht 65.0 in | Wt 208.6 lb

## 2019-08-23 DIAGNOSIS — E669 Obesity, unspecified: Secondary | ICD-10-CM | POA: Diagnosis not present

## 2019-08-23 DIAGNOSIS — Z23 Encounter for immunization: Secondary | ICD-10-CM | POA: Diagnosis not present

## 2019-08-23 DIAGNOSIS — Z9484 Stem cells transplant status: Secondary | ICD-10-CM | POA: Diagnosis not present

## 2019-08-23 DIAGNOSIS — D3A090 Benign carcinoid tumor of the bronchus and lung: Secondary | ICD-10-CM

## 2019-08-23 DIAGNOSIS — Z85110 Personal history of malignant carcinoid tumor of bronchus and lung: Secondary | ICD-10-CM | POA: Diagnosis not present

## 2019-08-23 DIAGNOSIS — C9001 Multiple myeloma in remission: Secondary | ICD-10-CM | POA: Diagnosis not present

## 2019-08-23 DIAGNOSIS — M8580 Other specified disorders of bone density and structure, unspecified site: Secondary | ICD-10-CM | POA: Diagnosis not present

## 2019-08-23 MED ORDER — MEASLES, MUMPS & RUBELLA VAC IJ SOLR
0.5000 mL | Freq: Once | INTRAMUSCULAR | Status: AC
  Administered 2019-08-23: 0.5 mL via SUBCUTANEOUS
  Filled 2019-08-23: qty 0.5

## 2019-08-23 NOTE — Assessment & Plan Note (Signed)
She just found out that she missed MMR vaccine after transplant We will administer today Otherwise, she is up-to-date with Covid vaccination

## 2019-08-23 NOTE — Assessment & Plan Note (Signed)
We discussed risk of cancer recurrence with obesity The patient would like to get additional advice and help to lose weight I will refer her to medical weight management center

## 2019-08-23 NOTE — Patient Instructions (Addendum)
Measles/Mumps/Rubella Vaccines, MMR injection What is this medicine? MEASLES VIRUS; MUMPS VIRUS; RUBELLA VIRUS VACCINE LIVE (MEE zuhlz VAHY ruhs; muhmps VAHY ruhs; roo bel uh VAHY ruhs vak SEEN [place] ) is used to prevent an infection with measles (rubeola), mumps, and rubella ([place] measles) viruses. It is used to prevent infection in children over 58 months old, adults that have not been vaccinated and are not pregnant, and anyone traveling to countries where there are high rates of measles, mumps, or rubella. This medicine may be used for other purposes; ask your health care provider or pharmacist if you have questions. COMMON BRAND NAME(S): M-M-R II What should I tell my health care provider before I take this medicine? They need to know if you have any of these conditions:  bleeding disorder  cancer including leukemia or lymphoma  immune system problems  infection with fever  low levels of platelets in the blood  recent blood transfusion or immune globulin infusion  seizure disorder  taking medicines for immunosuppression  an unusual or allergic reaction to vaccines, eggs, neomycin, gelatin, other medicines, foods, dyes, or preservatives  pregnant or trying to get pregnant  breast-feeding How should I use this medicine? This vaccine is for injection under the skin. It is given by a health care professional. A copy of Vaccine Information Statements will be given before each vaccination. Read this sheet carefully each time. The sheet may change frequently. Talk to your pediatrician regarding the use of this medicine in children. While this drug may be prescribed for children as young as 69 months of age for selected conditions, precautions do apply. Overdosage: If you think you have taken too much of this medicine contact a poison control center or emergency room at once. NOTE: This medicine is only for you. Do not share this medicine with others. What if I miss a dose? Keep  appointments for follow-up (booster) doses as directed. It is important not to miss your dose. Call your doctor or health care professional if you are unable to keep an appointment. What may interact with this medicine? Do not take this medicine with any of the following medications:  adalimumab  anakinra  etanercept  infliximab  medicines that suppress your immune system  medicines to treat cancer This medicine may also interact with the following medications:  immune globulins  live virus vaccines This list may not describe all possible interactions. Give your health care provider a list of all the medicines, herbs, non-prescription drugs, or dietary supplements you use. Also tell them if you smoke, drink alcohol, or use illegal drugs. Some items may interact with your medicine. What should I watch for while using this medicine? Visit your doctor for check-ups as directed. Do not become pregnant for 3 months after receiving this vaccine. Women should inform their doctor if they wish to become pregnant or think they might be pregnant. There is a potential for serious side effects to an unborn child. Talk to your health care professional or pharmacist for more information. What side effects may I notice from receiving this medicine? Side effects that you should report to your doctor or health care professional as soon as possible:  allergic reactions like skin rash, itching or hives, swelling of the face, lips, or tongue  breathing problems  changes in hearing  changes in vision  difficulty walking  extreme changes in behavior  fast, irregular heartbeat  fever over 100 degrees F  pain, tingling, numbness in the hands or feet  seizures  unusual bleeding or bruising  unusually weak or tired Side effects that usually do not require medical attention (report to your doctor or health care professional if they continue or are bothersome):  aches or pains  bruising,  pain, swelling at site where injected  diarrhea  headache  low-grade fever of 100 degrees F or less  nausea, vomiting  runny nose, cough  sleepy  swollen glands This list may not describe all possible side effects. Call your doctor for medical advice about side effects. You may report side effects to FDA at 1-800-FDA-1088. Where should I keep my medicine? This drug is given in a hospital or clinic and will not be stored at home. NOTE: This sheet is a summary. It may not cover all possible information. If you have questions about this medicine, talk to your doctor, pharmacist, or health care provider.  2020 Elsevier/Gold Standard (2013-02-11 11:04:43)

## 2019-08-23 NOTE — Progress Notes (Signed)
[place] OFFICE PROGRESS NOTE  Patient Care Team: Maurice Small, MD as PCP - General (Family Medicine) Laurence Spates, MD (Inactive) as Attending Physician (Gastroenterology) Jerrell Belfast, MD as Attending Physician (Otolaryngology) Collene Gobble, MD as Attending Physician (Pulmonary Disease) [place], Glorianne Manchester, DO as Consulting Physician (Hematology)  ASSESSMENT & PLAN:  Multiple myeloma in remission She stopped Ninlaro in March 2017 Recent blood work showed that she continues to remain in remission We have stopped Zometa after numerous doses; recent bone density scan show mild osteopenia only. She will continue calcium with vitamin D supplement. She agreed with the plan of care. I will see her back in 6 months for further assessment    Carcinoid tumor of left lung She had recent evaluation at [facility] CT imaging show no evidence of recurrence I will defer to them for further follow-up   Obesity (BMI 30.0-34.9) We discussed risk of cancer recurrence with obesity The patient would like to get additional advice and help to lose weight I will refer her to medical weight management center  H/O autologous stem cell transplant She just found out that she missed MMR vaccine after transplant We will administer today Otherwise, she is up-to-date with Covid vaccination   Orders Placed This Encounter  Procedures  . Comprehensive metabolic panel    Standing Status:   Standing    Number of Occurrences:   33    Standing Expiration Date:   08/22/2020  . CBC with Differential/Platelet    Standing Status:   Standing    Number of Occurrences:   22    Standing Expiration Date:   08/22/2020  . Kappa/lambda light chains    Standing Status:   Standing    Number of Occurrences:   22    Standing Expiration Date:   08/22/2020  . Multiple Myeloma Panel (SPEP&IFE w/QIG)    Standing Status:   Standing    Number of Occurrences:   22    Standing Expiration Date:    08/22/2020  . Amb Ref to Medical Weight Management    Referral Priority:   Routine    Referral Type:   Consultation    Number of Visits Requested:   1    All questions were answered. The patient knows to call the clinic with any problems, questions or concerns. The total time spent in the appointment was 20 minutes encounter with patients including review of chart and various tests results, discussions about plan of care and coordination of care plan   Heath Lark, MD 08/23/2019 4:28 PM  INTERVAL HISTORY: Please see below for problem oriented charting. She returns for further follow-up Denies recent bone pain She had appointment at [facility] with recent negative CT imaging She wants help to lose weight No recent infection, fever or chills  SUMMARY OF ONCOLOGIC HISTORY: Oncology History  Multiple myeloma in remission ([place])  05/27/2012 Initial Diagnosis   Multiple myeloma, without mention of having achieved remission(203.00)   05/27/2012 Cancer Staging   1.  IgG kappa multiple myeloma. She presented in 05/2012 with anemia, Cr 0.8, Ca 9.6; no bone lytic lesion; LDH 140; beta2- microglobulin 1.86 (ref 1.01-1.73). Positive SPEP for M-spike at 1.69, IgG 2080, kappa 7.24, kappa:lambda ratio 24.97; UPEP positive   06/08/2012 Bone Marrow Biopsy   Bone marrow biopsy on 06/08/12 showed 19% plasma cell; normal cytogenetics; myeloma FISH pending.    06/14/2012 Imaging   Skeletal survey negative for bone lytic lesions.  Lung nodule apparent.  09/17/2012 Bone Marrow Biopsy   Bone marrow biopsy showed 11% plasma cells.   10/18/2012 - 12/31/2012 Chemotherapy   Started on VRD (Velcade 1.3 mg/m^2  days 1,8 and 15; Revlimide 25 mg PO daily, on day 1 through 14; Decadron 40 mg PO Days 1, 8, 15).   Started on coumadin given RD.   01/14/2013 Bone Marrow Biopsy   The bone marrow clot sections are normocellular toslightly hypercellular for age (approximately 40% overall). 1% plasma cells.   02/08/2013 Bone  Marrow Transplant   Auto Transplant at [facility] (Dr. Glorianne Manchester McIver).   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 Lamonte Sakai)   07/08/2012 Pathology Results   Pathology consistent with Non-small cell lung cancer of RLL.    07/14/2012 Imaging   1. Right lower lobe nodule is non hypermetabolic which suggests benign etiology considering the slow growing rate in 6 years or non FDG avid disease as seen with carcinoid tumors. Pending biopsy. No PET evidence of mediastinal nodal disease.    08/02/2012 Imaging   MRI of brain negative.   08/12/2012 Surgery   Robotic Lobectomy of R Lower lobe.    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     Surgical resection margins are negative.     Two benign lymph nodes.     pTa1N0     REVIEW OF SYSTEMS:   Constitutional: Denies fevers, chills or abnormal weight loss Eyes: Denies blurriness of vision Ears, nose, mouth, throat, and face: Denies mucositis or sore throat Respiratory: Denies cough, dyspnea or wheezes Cardiovascular: Denies palpitation, chest discomfort or lower extremity swelling Gastrointestinal:  Denies  nausea, heartburn or change in bowel habits Skin: Denies abnormal skin rashes Lymphatics: Denies new lymphadenopathy or easy bruising Neurological:Denies numbness, tingling or new weaknesses Behavioral/Psych: Mood is stable, no new changes  All other systems were reviewed with the patient and are negative.  I have reviewed the past medical history, past surgical history, social history and family history with the patient and they are unchanged from previous note.  ALLERGIES:  has No Known Allergies.  MEDICATIONS:  Current Outpatient Medications  Medication Sig Dispense Refill  . cephALEXin (KEFLEX) 250 MG capsule Take 250 mg by mouth daily.    Marland Kitchen buPROPion (WELLBUTRIN XL) 300 MG 24 hr tablet Take 300 mg by mouth daily.    . cholecalciferol (VITAMIN D) 1000 UNITS tablet Take 2,000 Units by mouth daily.    Marland Kitchen losartan (COZAAR) 50 MG tablet Take 50 mg by mouth daily.    . magnesium chloride (SLOW-MAG) 64 MG TBEC SR tablet Take 1 tablet by mouth 2 (two) times daily.     Marland Kitchen  metFORMIN (GLUCOPHAGE) 500 MG tablet Take 1 tablet (500 mg total) by mouth 2 (two) times daily with a meal. 60 tablet 0  . metoprolol succinate (TOPROL-XL) 50 MG 24 hr tablet Take 50 mg by mouth daily. Take with or immediately following a meal.    . Multiple Vitamins-Minerals (MULTIVITAMIN WITH MINERALS) tablet Take 1 tablet by mouth at bedtime.     . Multiple Vitamins-Minerals (PRESERVISION AREDS 2) CAPS Take 1 capsule by mouth 2 (two) times daily.    . sertraline (ZOLOFT) 100 MG tablet Take 100 mg by mouth daily.     . simvastatin (ZOCOR) 40 MG tablet Take 40 mg by mouth at bedtime.      No current facility-administered medications for this visit.    PHYSICAL EXAMINATION: ECOG PERFORMANCE STATUS: 1 - Symptomatic but completely ambulatory  Vitals:   08/23/19 1157  BP: (!) 138/62  Pulse: 66  Resp: 18  Temp: 98.2 F (36.8 C)  SpO2: 97%   Filed Weights   08/23/19 1157  Weight: (!) 208 lb 9.6 oz (94.6 kg)     GENERAL:alert, no distress and comfortable SKIN: skin color, texture, turgor are normal, no rashes or significant lesions EYES: normal, Conjunctiva are pink and non-injected, sclera clear OROPHARYNX:no exudate, no erythema and lips, buccal mucosa, and tongue normal  NECK: supple, thyroid normal size, non-tender, without nodularity LYMPH:  no palpable lymphadenopathy in the cervical, axillary or inguinal LUNGS: clear to auscultation and percussion with normal breathing effort HEART: regular rate & rhythm and no murmurs and no lower extremity edema ABDOMEN:abdomen soft, non-tender and normal bowel sounds Musculoskeletal:no cyanosis of digits and no clubbing  NEURO: alert & oriented x 3 with fluent speech, no focal motor/sensory deficits  LABORATORY DATA:  I have reviewed the data as listed    Component Value Date/Time   NA 141 08/17/2019 1132   NA 141 12/05/2016 1447   K 4.2 08/17/2019 1132   K 4.3 12/05/2016 1447   CL 106 08/17/2019 1132   CL 105 06/08/2012 0913   CO2 24 08/17/2019 1132   CO2 23 12/05/2016 1447   GLUCOSE 161 (H) 08/17/2019 1132   GLUCOSE 131 12/05/2016 1447   GLUCOSE 120 (H) 06/08/2012 0913   BUN 15 08/17/2019 1132   BUN 17.0 12/05/2016 1447   CREATININE 0.88 08/17/2019 1132   CREATININE 0.86 02/18/2019 1123   CREATININE 0.8 12/05/2016 1447   CALCIUM 10.0 08/17/2019 1132   CALCIUM 9.1 12/05/2016 1447   PROT 6.9 08/17/2019 1132   PROT 6.8 12/05/2016 1447   PROT 6.3 12/05/2016 1446   ALBUMIN 3.7 08/17/2019 1132   ALBUMIN 3.7 12/05/2016 1447   AST 46 (H) 08/17/2019 1132   AST 44 (H) 02/18/2019 1123   AST 27 12/05/2016 1447   ALT 57 (H) 08/17/2019 1132   ALT 53 (H) 02/18/2019 1123   ALT 43 12/05/2016 1447   ALKPHOS 91 08/17/2019 1132   ALKPHOS 85 12/05/2016 1447   BILITOT 0.4 08/17/2019 1132   BILITOT 0.5 02/18/2019 1123   BILITOT 0.68 12/05/2016 1447   GFRNONAA >60 08/17/2019 1132   GFRNONAA >60 02/18/2019 1123   GFRAA >60 08/17/2019 1132    GFRAA >60 02/18/2019 1123    No results found for: SPEP, UPEP  Lab Results  Component Value Date   WBC 3.9 (L) 08/17/2019   NEUTROABS 2.1 08/17/2019   HGB 11.9 (L) 08/17/2019   HCT 37.3 08/17/2019   MCV 99.7 08/17/2019   PLT 147 (L) 08/17/2019  Chemistry      Component Value Date/Time   NA 141 08/17/2019 1132   NA 141 12/05/2016 1447   K 4.2 08/17/2019 1132   K 4.3 12/05/2016 1447   CL 106 08/17/2019 1132   CL 105 06/08/2012 0913   CO2 24 08/17/2019 1132   CO2 23 12/05/2016 1447   BUN 15 08/17/2019 1132   BUN 17.0 12/05/2016 1447   CREATININE 0.88 08/17/2019 1132   CREATININE 0.86 02/18/2019 1123   CREATININE 0.8 12/05/2016 1447      Component Value Date/Time   CALCIUM 10.0 08/17/2019 1132   CALCIUM 9.1 12/05/2016 1447   ALKPHOS 91 08/17/2019 1132   ALKPHOS 85 12/05/2016 1447   AST 46 (H) 08/17/2019 1132   AST 44 (H) 02/18/2019 1123   AST 27 12/05/2016 1447   ALT 57 (H) 08/17/2019 1132   ALT 53 (H) 02/18/2019 1123   ALT 43 12/05/2016 1447   BILITOT 0.4 08/17/2019 1132   BILITOT 0.5 02/18/2019 1123   BILITOT 0.68 12/05/2016 1447

## 2019-08-23 NOTE — Assessment & Plan Note (Signed)
She stopped Ninlaro in March 2017 Recent blood work showed that she continues to remain in remission We have stopped Zometa after numerous doses; recent bone density scan show mild osteopenia only. She will continue calcium with vitamin D supplement. She agreed with the plan of care. I will see her back in 6 months for further assessment

## 2019-08-23 NOTE — Assessment & Plan Note (Signed)
She had recent evaluation at [facility] CT imaging show no evidence of recurrence I will defer to them for further follow-up

## 2019-08-24 ENCOUNTER — Telehealth: Payer: Self-pay | Admitting: Hematology and Oncology

## 2019-08-24 NOTE — Telephone Encounter (Signed)
Scheduled per 7/27 sch msg. Called and spoke with pt, confirmed 1/20 and 1/27 appts. Pt request appt letter and calendar printout

## 2019-09-08 ENCOUNTER — Ambulatory Visit (INDEPENDENT_AMBULATORY_CARE_PROVIDER_SITE_OTHER): Payer: Medicare Other | Admitting: Family Medicine

## 2019-09-13 ENCOUNTER — Ambulatory Visit (INDEPENDENT_AMBULATORY_CARE_PROVIDER_SITE_OTHER): Payer: Medicare Other | Admitting: Family Medicine

## 2019-09-19 ENCOUNTER — Encounter (INDEPENDENT_AMBULATORY_CARE_PROVIDER_SITE_OTHER): Payer: Medicare Other | Admitting: Ophthalmology

## 2019-09-19 ENCOUNTER — Other Ambulatory Visit: Payer: Self-pay

## 2019-09-19 DIAGNOSIS — H43813 Vitreous degeneration, bilateral: Secondary | ICD-10-CM

## 2019-09-19 DIAGNOSIS — H35033 Hypertensive retinopathy, bilateral: Secondary | ICD-10-CM

## 2019-09-19 DIAGNOSIS — H353112 Nonexudative age-related macular degeneration, right eye, intermediate dry stage: Secondary | ICD-10-CM

## 2019-09-19 DIAGNOSIS — E1140 Type 2 diabetes mellitus with diabetic neuropathy, unspecified: Secondary | ICD-10-CM | POA: Diagnosis not present

## 2019-09-19 DIAGNOSIS — I10 Essential (primary) hypertension: Secondary | ICD-10-CM | POA: Diagnosis not present

## 2019-09-19 DIAGNOSIS — H353221 Exudative age-related macular degeneration, left eye, with active choroidal neovascularization: Secondary | ICD-10-CM | POA: Diagnosis not present

## 2019-09-19 DIAGNOSIS — E785 Hyperlipidemia, unspecified: Secondary | ICD-10-CM | POA: Diagnosis not present

## 2019-09-22 DIAGNOSIS — E1140 Type 2 diabetes mellitus with diabetic neuropathy, unspecified: Secondary | ICD-10-CM | POA: Diagnosis not present

## 2019-09-22 DIAGNOSIS — C9000 Multiple myeloma not having achieved remission: Secondary | ICD-10-CM | POA: Diagnosis not present

## 2019-09-22 DIAGNOSIS — Z0000 Encounter for general adult medical examination without abnormal findings: Secondary | ICD-10-CM | POA: Diagnosis not present

## 2019-09-22 DIAGNOSIS — F3341 Major depressive disorder, recurrent, in partial remission: Secondary | ICD-10-CM | POA: Diagnosis not present

## 2019-09-22 DIAGNOSIS — G4733 Obstructive sleep apnea (adult) (pediatric): Secondary | ICD-10-CM | POA: Diagnosis not present

## 2019-09-22 DIAGNOSIS — I10 Essential (primary) hypertension: Secondary | ICD-10-CM | POA: Diagnosis not present

## 2019-09-22 DIAGNOSIS — E785 Hyperlipidemia, unspecified: Secondary | ICD-10-CM | POA: Diagnosis not present

## 2019-09-22 DIAGNOSIS — I2510 Atherosclerotic heart disease of native coronary artery without angina pectoris: Secondary | ICD-10-CM | POA: Diagnosis not present

## 2019-09-27 ENCOUNTER — Ambulatory Visit (INDEPENDENT_AMBULATORY_CARE_PROVIDER_SITE_OTHER): Payer: Medicare Other | Admitting: Family Medicine

## 2019-09-28 ENCOUNTER — Other Ambulatory Visit: Payer: Self-pay | Admitting: Family Medicine

## 2019-09-28 DIAGNOSIS — E2839 Other primary ovarian failure: Secondary | ICD-10-CM

## 2019-09-29 DIAGNOSIS — L309 Dermatitis, unspecified: Secondary | ICD-10-CM | POA: Diagnosis not present

## 2019-10-04 ENCOUNTER — Ambulatory Visit: Payer: Medicare Other | Attending: Internal Medicine

## 2019-10-04 DIAGNOSIS — Z23 Encounter for immunization: Secondary | ICD-10-CM

## 2019-10-04 NOTE — Progress Notes (Signed)
   Covid-19 Vaccination Clinic  Name:  [name]    MRN: [id number] DOB: 31-Oct-1945  10/04/2019  [name] was observed post Covid-19 immunization for 15 minutes without incident. She was provided with Vaccine Information Sheet and instruction to access the V-Safe system.  Vaccine Administered by Jamesetta Geralds - [employer] PharmD Student   [name] was instructed to call 911 with any severe reactions post vaccine: Marland Kitchen Difficulty breathing  . Swelling of face and throat  . A fast heartbeat  . A bad rash all over body  . Dizziness and weakness

## 2019-10-24 ENCOUNTER — Other Ambulatory Visit: Payer: Self-pay

## 2019-10-24 ENCOUNTER — Encounter (INDEPENDENT_AMBULATORY_CARE_PROVIDER_SITE_OTHER): Payer: Medicare Other | Admitting: Ophthalmology

## 2019-10-24 DIAGNOSIS — H353112 Nonexudative age-related macular degeneration, right eye, intermediate dry stage: Secondary | ICD-10-CM

## 2019-10-24 DIAGNOSIS — H43813 Vitreous degeneration, bilateral: Secondary | ICD-10-CM | POA: Diagnosis not present

## 2019-10-24 DIAGNOSIS — I10 Essential (primary) hypertension: Secondary | ICD-10-CM | POA: Diagnosis not present

## 2019-10-24 DIAGNOSIS — H353221 Exudative age-related macular degeneration, left eye, with active choroidal neovascularization: Secondary | ICD-10-CM | POA: Diagnosis not present

## 2019-10-24 DIAGNOSIS — H35033 Hypertensive retinopathy, bilateral: Secondary | ICD-10-CM | POA: Diagnosis not present

## 2019-10-28 DIAGNOSIS — M25561 Pain in right knee: Secondary | ICD-10-CM | POA: Diagnosis not present

## 2019-11-28 ENCOUNTER — Other Ambulatory Visit: Payer: Self-pay

## 2019-11-28 ENCOUNTER — Encounter (INDEPENDENT_AMBULATORY_CARE_PROVIDER_SITE_OTHER): Payer: Medicare Other | Admitting: Ophthalmology

## 2019-11-28 DIAGNOSIS — H353112 Nonexudative age-related macular degeneration, right eye, intermediate dry stage: Secondary | ICD-10-CM

## 2019-11-28 DIAGNOSIS — H353221 Exudative age-related macular degeneration, left eye, with active choroidal neovascularization: Secondary | ICD-10-CM

## 2019-11-28 DIAGNOSIS — H43813 Vitreous degeneration, bilateral: Secondary | ICD-10-CM

## 2019-11-28 DIAGNOSIS — H35033 Hypertensive retinopathy, bilateral: Secondary | ICD-10-CM | POA: Diagnosis not present

## 2019-11-28 DIAGNOSIS — I10 Essential (primary) hypertension: Secondary | ICD-10-CM | POA: Diagnosis not present

## 2019-12-09 DIAGNOSIS — Z23 Encounter for immunization: Secondary | ICD-10-CM | POA: Diagnosis not present

## 2019-12-09 DIAGNOSIS — T691XXA Chilblains, initial encounter: Secondary | ICD-10-CM | POA: Diagnosis not present

## 2019-12-15 ENCOUNTER — Other Ambulatory Visit: Payer: Self-pay | Admitting: Family Medicine

## 2019-12-15 DIAGNOSIS — Z1231 Encounter for screening mammogram for malignant neoplasm of breast: Secondary | ICD-10-CM

## 2019-12-26 ENCOUNTER — Other Ambulatory Visit: Payer: Self-pay

## 2019-12-26 ENCOUNTER — Encounter (INDEPENDENT_AMBULATORY_CARE_PROVIDER_SITE_OTHER): Payer: Medicare Other | Admitting: Ophthalmology

## 2019-12-26 DIAGNOSIS — I10 Essential (primary) hypertension: Secondary | ICD-10-CM

## 2019-12-26 DIAGNOSIS — H35033 Hypertensive retinopathy, bilateral: Secondary | ICD-10-CM | POA: Diagnosis not present

## 2019-12-26 DIAGNOSIS — H353221 Exudative age-related macular degeneration, left eye, with active choroidal neovascularization: Secondary | ICD-10-CM | POA: Diagnosis not present

## 2019-12-26 DIAGNOSIS — H43813 Vitreous degeneration, bilateral: Secondary | ICD-10-CM | POA: Diagnosis not present

## 2019-12-26 DIAGNOSIS — H353112 Nonexudative age-related macular degeneration, right eye, intermediate dry stage: Secondary | ICD-10-CM

## 2019-12-27 DIAGNOSIS — Z23 Encounter for immunization: Secondary | ICD-10-CM | POA: Diagnosis not present

## 2020-01-31 ENCOUNTER — Encounter (INDEPENDENT_AMBULATORY_CARE_PROVIDER_SITE_OTHER): Payer: Medicare Other | Admitting: Ophthalmology

## 2020-02-03 ENCOUNTER — Encounter (INDEPENDENT_AMBULATORY_CARE_PROVIDER_SITE_OTHER): Payer: Medicare Other | Admitting: Ophthalmology

## 2020-02-03 ENCOUNTER — Other Ambulatory Visit: Payer: Self-pay

## 2020-02-03 DIAGNOSIS — H35033 Hypertensive retinopathy, bilateral: Secondary | ICD-10-CM | POA: Diagnosis not present

## 2020-02-03 DIAGNOSIS — H353221 Exudative age-related macular degeneration, left eye, with active choroidal neovascularization: Secondary | ICD-10-CM | POA: Diagnosis not present

## 2020-02-03 DIAGNOSIS — I10 Essential (primary) hypertension: Secondary | ICD-10-CM

## 2020-02-03 DIAGNOSIS — H353112 Nonexudative age-related macular degeneration, right eye, intermediate dry stage: Secondary | ICD-10-CM

## 2020-02-03 DIAGNOSIS — H43813 Vitreous degeneration, bilateral: Secondary | ICD-10-CM | POA: Diagnosis not present

## 2020-02-16 ENCOUNTER — Inpatient Hospital Stay: Payer: Medicare Other | Attending: Hematology and Oncology

## 2020-02-16 ENCOUNTER — Other Ambulatory Visit: Payer: Self-pay

## 2020-02-16 ENCOUNTER — Other Ambulatory Visit: Payer: Medicare Other

## 2020-02-16 DIAGNOSIS — Z9484 Stem cells transplant status: Secondary | ICD-10-CM

## 2020-02-16 DIAGNOSIS — Z7984 Long term (current) use of oral hypoglycemic drugs: Secondary | ICD-10-CM | POA: Insufficient documentation

## 2020-02-16 DIAGNOSIS — N183 Chronic kidney disease, stage 3 unspecified: Secondary | ICD-10-CM | POA: Insufficient documentation

## 2020-02-16 DIAGNOSIS — R944 Abnormal results of kidney function studies: Secondary | ICD-10-CM | POA: Diagnosis not present

## 2020-02-16 DIAGNOSIS — Z79899 Other long term (current) drug therapy: Secondary | ICD-10-CM | POA: Insufficient documentation

## 2020-02-16 DIAGNOSIS — M8580 Other specified disorders of bone density and structure, unspecified site: Secondary | ICD-10-CM | POA: Diagnosis not present

## 2020-02-16 DIAGNOSIS — C9001 Multiple myeloma in remission: Secondary | ICD-10-CM | POA: Diagnosis not present

## 2020-02-16 LAB — COMPREHENSIVE METABOLIC PANEL
ALT: 56 U/L — ABNORMAL HIGH (ref 0–44)
AST: 40 U/L (ref 15–41)
Albumin: 3.8 g/dL (ref 3.5–5.0)
Alkaline Phosphatase: 88 U/L (ref 38–126)
Anion gap: 11 (ref 5–15)
BUN: 19 mg/dL (ref 8–23)
CO2: 25 mmol/L (ref 22–32)
Calcium: 9.5 mg/dL (ref 8.9–10.3)
Chloride: 104 mmol/L (ref 98–111)
Creatinine, Ser: 1.11 mg/dL — ABNORMAL HIGH (ref 0.44–1.00)
GFR, Estimated: 52 mL/min — ABNORMAL LOW (ref >60)
Glucose, Bld: 147 mg/dL — ABNORMAL HIGH (ref 70–99)
Potassium: 3.8 mmol/L (ref 3.5–5.1)
Sodium: 140 mmol/L (ref 135–145)
Total Bilirubin: 0.6 mg/dL (ref 0.3–1.2)
Total Protein: 7.2 g/dL (ref 6.5–8.1)

## 2020-02-16 LAB — CBC WITH DIFFERENTIAL/PLATELET
Abs Immature Granulocytes: 0.01 10*3/uL (ref 0.00–0.07)
Basophils Absolute: 0 10*3/uL (ref 0.0–0.1)
Basophils Relative: 1 %
Eosinophils Absolute: 0.1 10*3/uL (ref 0.0–0.5)
Eosinophils Relative: 3 %
HCT: 37.4 % (ref 36.0–46.0)
Hemoglobin: 12.3 g/dL (ref 12.0–15.0)
Immature Granulocytes: 0 %
Lymphocytes Relative: 31 %
Lymphs Abs: 1.3 10*3/uL (ref 0.7–4.0)
MCH: 31.9 pg (ref 26.0–34.0)
MCHC: 32.9 g/dL (ref 30.0–36.0)
MCV: 96.9 fL (ref 80.0–100.0)
Monocytes Absolute: 0.4 10*3/uL (ref 0.1–1.0)
Monocytes Relative: 8 %
Neutro Abs: 2.5 10*3/uL (ref 1.7–7.7)
Neutrophils Relative %: 57 %
Platelets: 162 10*3/uL (ref 150–400)
RBC: 3.86 MIL/uL — ABNORMAL LOW (ref 3.87–5.11)
RDW: 14.5 % (ref 11.5–15.5)
WBC: 4.3 10*3/uL (ref 4.0–10.5)
nRBC: 0 % (ref 0.0–0.2)

## 2020-02-17 LAB — KAPPA/LAMBDA LIGHT CHAINS
Kappa free light chain: 18.6 mg/L (ref 3.3–19.4)
Kappa, lambda light chain ratio: 1.23 (ref 0.26–1.65)
Lambda free light chains: 15.1 mg/L (ref 5.7–26.3)

## 2020-02-20 LAB — MULTIPLE MYELOMA PANEL, SERUM
Albumin SerPl Elph-Mcnc: 3.7 g/dL (ref 2.9–4.4)
Albumin/Glob SerPl: 1.3 (ref 0.7–1.7)
Alpha 1: 0.2 g/dL (ref 0.0–0.4)
Alpha2 Glob SerPl Elph-Mcnc: 0.7 g/dL (ref 0.4–1.0)
B-Globulin SerPl Elph-Mcnc: 1.1 g/dL (ref 0.7–1.3)
Gamma Glob SerPl Elph-Mcnc: 0.9 g/dL (ref 0.4–1.8)
Globulin, Total: 2.9 g/dL (ref 2.2–3.9)
IgA: 196 mg/dL (ref 64–422)
IgG (Immunoglobin G), Serum: 830 mg/dL (ref 586–1602)
IgM (Immunoglobulin M), Srm: 43 mg/dL (ref 26–217)
Total Protein ELP: 6.6 g/dL (ref 6.0–8.5)

## 2020-02-23 ENCOUNTER — Other Ambulatory Visit: Payer: Self-pay

## 2020-02-23 ENCOUNTER — Telehealth: Payer: Self-pay | Admitting: Hematology and Oncology

## 2020-02-23 ENCOUNTER — Inpatient Hospital Stay (HOSPITAL_BASED_OUTPATIENT_CLINIC_OR_DEPARTMENT_OTHER): Payer: Medicare Other | Admitting: Hematology and Oncology

## 2020-02-23 ENCOUNTER — Encounter: Payer: Self-pay | Admitting: Hematology and Oncology

## 2020-02-23 DIAGNOSIS — M8580 Other specified disorders of bone density and structure, unspecified site: Secondary | ICD-10-CM | POA: Diagnosis not present

## 2020-02-23 DIAGNOSIS — D3A090 Benign carcinoid tumor of the bronchus and lung: Secondary | ICD-10-CM | POA: Diagnosis not present

## 2020-02-23 DIAGNOSIS — Z7984 Long term (current) use of oral hypoglycemic drugs: Secondary | ICD-10-CM | POA: Diagnosis not present

## 2020-02-23 DIAGNOSIS — N183 Chronic kidney disease, stage 3 unspecified: Secondary | ICD-10-CM | POA: Diagnosis not present

## 2020-02-23 DIAGNOSIS — C9001 Multiple myeloma in remission: Secondary | ICD-10-CM

## 2020-02-23 DIAGNOSIS — R944 Abnormal results of kidney function studies: Secondary | ICD-10-CM | POA: Diagnosis not present

## 2020-02-23 DIAGNOSIS — Z79899 Other long term (current) drug therapy: Secondary | ICD-10-CM | POA: Diagnosis not present

## 2020-02-23 NOTE — Assessment & Plan Note (Signed)
She has intermittent elevated serum creatinine We discussed the importance of lifestyle changes and risk factor modification She is reminded to drink more liquids

## 2020-02-23 NOTE — Assessment & Plan Note (Signed)
She usually followed at [facility] I will defer to them for further follow-up

## 2020-02-23 NOTE — Assessment & Plan Note (Signed)
She stopped Ninlaro in March 2017 Recent blood work showed that she continues to remain in remission We have stopped Zometa after numerous doses; recent bone density scan show mild osteopenia only. She will continue calcium with vitamin D supplement. She agreed with the plan of care. I will see her back in 6 months for further assessment

## 2020-02-23 NOTE — Telephone Encounter (Signed)
Scheduled follow-up appointments per 1/27 schedule message. Patient is aware.

## 2020-02-23 NOTE — Progress Notes (Signed)
[place] OFFICE PROGRESS NOTE  Patient Care Team: Maurice Small, MD as PCP - General (Family Medicine) Laurence Spates, MD (Inactive) as Attending Physician (Gastroenterology) Jerrell Belfast, MD as Attending Physician (Otolaryngology) Collene Gobble, MD as Attending Physician (Pulmonary Disease) [place], Glorianne Manchester, DO as Consulting Physician (Hematology)  ASSESSMENT & PLAN:  Multiple myeloma in remission She stopped Ninlaro in March 2017 Recent blood work showed that she continues to remain in remission We have stopped Zometa after numerous doses; recent bone density scan show mild osteopenia only. She will continue calcium with vitamin D supplement. She agreed with the plan of care. I will see her back in 6 months for further assessment    Chronic kidney disease, stage III (moderate) She has intermittent elevated serum creatinine We discussed the importance of lifestyle changes and risk factor modification She is reminded to drink more liquids  Carcinoid tumor of left lung She usually followed at [facility] I will defer to them for further follow-up    No orders of the defined types were placed in this encounter.   All questions were answered. The patient knows to call the clinic with any problems, questions or concerns. The total time spent in the appointment was 20 minutes encounter with patients including review of chart and various tests results, discussions about plan of care and coordination of care plan   Heath Lark, MD 02/23/2020 11:51 AM  INTERVAL HISTORY: Please see below for problem oriented charting. She returns for further follow-up for multiple myeloma She is doing well No recent infection, fever or chills She is up-to-date with her vaccination program No new bone pain  SUMMARY OF ONCOLOGIC HISTORY: Oncology History  Multiple myeloma in remission ([place])  05/27/2012 Initial Diagnosis   Multiple myeloma, without mention of having  achieved remission(203.00)   05/27/2012 Cancer Staging   1.  IgG kappa multiple myeloma. She presented in 05/2012 with anemia, Cr 0.8, Ca 9.6; no bone lytic lesion; LDH 140; beta2- microglobulin 1.86 (ref 1.01-1.73). Positive SPEP for M-spike at 1.69, IgG 2080, kappa 7.24, kappa:lambda ratio 24.97; UPEP positive   06/08/2012 Bone Marrow Biopsy   Bone marrow biopsy on 06/08/12 showed 19% plasma cell; normal cytogenetics; myeloma FISH pending.    06/14/2012 Imaging   Skeletal survey negative for bone lytic lesions.  Lung nodule apparent.   09/17/2012 Bone Marrow Biopsy   Bone marrow biopsy showed 11% plasma cells.   10/18/2012 - 12/31/2012 Chemotherapy   Started on VRD (Velcade 1.3 mg/m^2 [place] days 1,8 and 15; Revlimide 25 mg PO daily, on day 1 through 14; Decadron 40 mg PO Days 1, 8, 15).   Started on coumadin given RD.   01/14/2013 Bone Marrow Biopsy   The bone marrow clot sections are normocellular toslightly hypercellular for age (approximately 40% overall). 1% plasma cells.   02/08/2013 Bone Marrow Transplant   Auto Transplant at [facility] (Dr. Glorianne Manchester McIver).   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 Lamonte Sakai)   07/08/2012 Pathology  Results   Pathology consistent with Non-small cell lung cancer of RLL.    07/14/2012 Imaging   1. Right lower lobe nodule is non hypermetabolic which suggests benign etiology considering the slow growing rate in 6 years or non FDG avid disease as seen with carcinoid tumors. Pending biopsy. No PET evidence of mediastinal nodal disease.    08/02/2012 Imaging   MRI of brain negative.   08/12/2012 Surgery   Robotic Lobectomy of R Lower lobe.    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     Surgical resection margins are negative.     Two benign lymph nodes.     pTa1N0     REVIEW OF SYSTEMS:   Constitutional: Denies fevers, chills or abnormal weight loss Eyes: Denies blurriness of vision Ears, nose, mouth, throat, and face: Denies mucositis or sore throat Respiratory: Denies cough, dyspnea or wheezes Cardiovascular: Denies palpitation, chest discomfort or lower extremity swelling Gastrointestinal:  Denies nausea, heartburn or change in bowel habits Skin: Denies abnormal skin rashes Lymphatics: Denies new lymphadenopathy or easy bruising Neurological:Denies numbness, tingling or new weaknesses Behavioral/Psych: Mood is stable, no new changes  All other systems were reviewed with the patient and are negative.  I have reviewed the past medical history, past surgical history, social history and family history with the patient and they are unchanged from previous note.  ALLERGIES:  has No Known Allergies.  MEDICATIONS:  Current Outpatient Medications  Medication Sig Dispense Refill  . buPROPion (WELLBUTRIN XL) 300 MG 24 hr tablet Take 300 mg by mouth daily.    . cephALEXin (KEFLEX) 250 MG capsule Take 250 mg by mouth daily.    . cholecalciferol (VITAMIN D) 1000 UNITS tablet Take 2,000 Units by mouth daily.    Marland Kitchen losartan (COZAAR) 50 MG tablet Take 50 mg by mouth daily.    . magnesium chloride (SLOW-MAG)  64 MG TBEC SR tablet Take 1 tablet by mouth 2 (two) times daily.     . metFORMIN (GLUCOPHAGE) 500 MG tablet Take 1 tablet (500 mg total) by mouth 2 (two) times daily with a meal. 60 tablet 0  . metoprolol succinate (TOPROL-XL) 50 MG 24 hr tablet Take 50 mg by mouth daily. Take with or immediately following a meal.    . Multiple Vitamins-Minerals (MULTIVITAMIN WITH MINERALS) tablet Take 1 tablet by mouth at bedtime.     . Multiple Vitamins-Minerals (PRESERVISION AREDS 2) CAPS Take 1 capsule by mouth 2 (two) times daily.    . sertraline (ZOLOFT) 100 MG tablet Take 100 mg by mouth daily.     . simvastatin (ZOCOR) 40 MG tablet Take 40 mg by mouth at bedtime.      No current facility-administered medications for this visit.    PHYSICAL EXAMINATION: ECOG PERFORMANCE STATUS: 0 - Asymptomatic  Vitals:   02/23/20 1118  BP: (!) 141/54  Pulse: 67  Resp: 18  Temp: 97.8 F (36.6 C)   Filed Weights   02/23/20 1118  Weight: 207 lb 6.4 oz (94.1 kg)    GENERAL:alert, no distress and comfortable SKIN: skin color, texture, turgor are normal, no rashes or  significant lesions EYES: normal, Conjunctiva are pink and non-injected, sclera clear OROPHARYNX:no exudate, no erythema and lips, buccal mucosa, and tongue normal  NECK: supple, thyroid normal size, non-tender, without nodularity LYMPH:  no palpable lymphadenopathy in the cervical, axillary or inguinal LUNGS: clear to auscultation and percussion with normal breathing effort HEART: regular rate & rhythm and no murmurs and no lower extremity edema ABDOMEN:abdomen soft, non-tender and normal bowel sounds Musculoskeletal:no cyanosis of digits and no clubbing  NEURO: alert & oriented x 3 with fluent speech, no focal motor/sensory deficits  LABORATORY DATA:  I have reviewed the data as listed    Component Value Date/Time   NA 140 02/16/2020 1233   NA 141 12/05/2016 1447   K 3.8 02/16/2020 1233   K 4.3 12/05/2016 1447   CL 104 02/16/2020 1233    CL 105 06/08/2012 0913   CO2 25 02/16/2020 1233   CO2 23 12/05/2016 1447   GLUCOSE 147 (H) 02/16/2020 1233   GLUCOSE 131 12/05/2016 1447   GLUCOSE 120 (H) 06/08/2012 0913   BUN 19 02/16/2020 1233   BUN 17.0 12/05/2016 1447   CREATININE 1.11 (H) 02/16/2020 1233   CREATININE 0.86 02/18/2019 1123   CREATININE 0.8 12/05/2016 1447   CALCIUM 9.5 02/16/2020 1233   CALCIUM 9.1 12/05/2016 1447   PROT 7.2 02/16/2020 1233   PROT 6.8 12/05/2016 1447   PROT 6.3 12/05/2016 1446   ALBUMIN 3.8 02/16/2020 1233   ALBUMIN 3.7 12/05/2016 1447   AST 40 02/16/2020 1233   AST 44 (H) 02/18/2019 1123   AST 27 12/05/2016 1447   ALT 56 (H) 02/16/2020 1233   ALT 53 (H) 02/18/2019 1123   ALT 43 12/05/2016 1447   ALKPHOS 88 02/16/2020 1233   ALKPHOS 85 12/05/2016 1447   BILITOT 0.6 02/16/2020 1233   BILITOT 0.5 02/18/2019 1123   BILITOT 0.68 12/05/2016 1447   GFRNONAA 52 (L) 02/16/2020 1233   GFRNONAA >60 02/18/2019 1123   GFRAA >60 08/17/2019 1132   GFRAA >60 02/18/2019 1123    No results found for: SPEP, UPEP  Lab Results  Component Value Date   WBC 4.3 02/16/2020   NEUTROABS 2.5 02/16/2020   HGB 12.3 02/16/2020   HCT 37.4 02/16/2020   MCV 96.9 02/16/2020   PLT 162 02/16/2020      Chemistry      Component Value Date/Time   NA 140 02/16/2020 1233   NA 141 12/05/2016 1447   K 3.8 02/16/2020 1233   K 4.3 12/05/2016 1447   CL 104 02/16/2020 1233   CL 105 06/08/2012 0913   CO2 25 02/16/2020 1233   CO2 23 12/05/2016 1447   BUN 19 02/16/2020 1233   BUN 17.0 12/05/2016 1447   CREATININE 1.11 (H) 02/16/2020 1233   CREATININE 0.86 02/18/2019 1123   CREATININE 0.8 12/05/2016 1447      Component Value Date/Time   CALCIUM 9.5 02/16/2020 1233   CALCIUM 9.1 12/05/2016 1447   ALKPHOS 88 02/16/2020 1233   ALKPHOS 85 12/05/2016 1447   AST 40 02/16/2020 1233   AST 44 (H) 02/18/2019 1123   AST 27 12/05/2016 1447   ALT 56 (H) 02/16/2020 1233   ALT 53 (H) 02/18/2019 1123   ALT 43  12/05/2016 1447   BILITOT 0.6 02/16/2020 1233   BILITOT 0.5 02/18/2019 1123   BILITOT 0.68 12/05/2016 1447

## 2020-02-28 DIAGNOSIS — L308 Other specified dermatitis: Secondary | ICD-10-CM | POA: Diagnosis not present

## 2020-02-28 DIAGNOSIS — D485 Neoplasm of uncertain behavior of skin: Secondary | ICD-10-CM | POA: Diagnosis not present

## 2020-02-29 DIAGNOSIS — G4719 Other hypersomnia: Secondary | ICD-10-CM | POA: Diagnosis not present

## 2020-03-02 ENCOUNTER — Encounter (INDEPENDENT_AMBULATORY_CARE_PROVIDER_SITE_OTHER): Payer: Medicare Other | Admitting: Ophthalmology

## 2020-03-02 ENCOUNTER — Other Ambulatory Visit: Payer: Self-pay

## 2020-03-02 DIAGNOSIS — H35033 Hypertensive retinopathy, bilateral: Secondary | ICD-10-CM

## 2020-03-02 DIAGNOSIS — H43813 Vitreous degeneration, bilateral: Secondary | ICD-10-CM | POA: Diagnosis not present

## 2020-03-02 DIAGNOSIS — H353221 Exudative age-related macular degeneration, left eye, with active choroidal neovascularization: Secondary | ICD-10-CM | POA: Diagnosis not present

## 2020-03-02 DIAGNOSIS — H353112 Nonexudative age-related macular degeneration, right eye, intermediate dry stage: Secondary | ICD-10-CM

## 2020-03-02 DIAGNOSIS — I10 Essential (primary) hypertension: Secondary | ICD-10-CM

## 2020-03-07 ENCOUNTER — Other Ambulatory Visit: Payer: Self-pay | Admitting: Family Medicine

## 2020-03-07 ENCOUNTER — Ambulatory Visit
Admission: RE | Admit: 2020-03-07 | Discharge: 2020-03-07 | Disposition: A | Discharge status: Home / Self Care | Payer: Medicare Other | Source: Ambulatory Visit | Attending: Family Medicine | Admitting: Family Medicine

## 2020-03-07 DIAGNOSIS — S90129A Contusion of unspecified lesser toe(s) without damage to nail, initial encounter: Secondary | ICD-10-CM

## 2020-03-07 DIAGNOSIS — M79674 Pain in right toe(s): Secondary | ICD-10-CM | POA: Diagnosis not present

## 2020-03-07 DIAGNOSIS — S90111A Contusion of right great toe without damage to nail, initial encounter: Secondary | ICD-10-CM | POA: Diagnosis not present

## 2020-03-20 DIAGNOSIS — G4733 Obstructive sleep apnea (adult) (pediatric): Secondary | ICD-10-CM | POA: Diagnosis not present

## 2020-03-22 DIAGNOSIS — B309 Viral conjunctivitis, unspecified: Secondary | ICD-10-CM | POA: Diagnosis not present

## 2020-03-22 DIAGNOSIS — K130 Diseases of lips: Secondary | ICD-10-CM | POA: Diagnosis not present

## 2020-03-29 ENCOUNTER — Other Ambulatory Visit: Payer: Medicare Other

## 2020-03-29 ENCOUNTER — Ambulatory Visit: Payer: Medicare Other

## 2020-03-30 ENCOUNTER — Other Ambulatory Visit: Payer: Self-pay

## 2020-03-30 ENCOUNTER — Encounter (INDEPENDENT_AMBULATORY_CARE_PROVIDER_SITE_OTHER): Payer: Medicare Other | Admitting: Ophthalmology

## 2020-03-30 DIAGNOSIS — H43813 Vitreous degeneration, bilateral: Secondary | ICD-10-CM

## 2020-03-30 DIAGNOSIS — H353221 Exudative age-related macular degeneration, left eye, with active choroidal neovascularization: Secondary | ICD-10-CM

## 2020-03-30 DIAGNOSIS — I10 Essential (primary) hypertension: Secondary | ICD-10-CM

## 2020-03-30 DIAGNOSIS — H353112 Nonexudative age-related macular degeneration, right eye, intermediate dry stage: Secondary | ICD-10-CM | POA: Diagnosis not present

## 2020-03-30 DIAGNOSIS — H35033 Hypertensive retinopathy, bilateral: Secondary | ICD-10-CM

## 2020-04-07 DIAGNOSIS — H1013 Acute atopic conjunctivitis, bilateral: Secondary | ICD-10-CM | POA: Diagnosis not present

## 2020-04-19 DIAGNOSIS — H16143 Punctate keratitis, bilateral: Secondary | ICD-10-CM | POA: Diagnosis not present

## 2020-04-27 DIAGNOSIS — H16143 Punctate keratitis, bilateral: Secondary | ICD-10-CM | POA: Diagnosis not present

## 2020-04-30 ENCOUNTER — Other Ambulatory Visit: Payer: Self-pay

## 2020-04-30 ENCOUNTER — Encounter (INDEPENDENT_AMBULATORY_CARE_PROVIDER_SITE_OTHER): Payer: Medicare Other | Admitting: Ophthalmology

## 2020-04-30 DIAGNOSIS — H353112 Nonexudative age-related macular degeneration, right eye, intermediate dry stage: Secondary | ICD-10-CM

## 2020-04-30 DIAGNOSIS — H353221 Exudative age-related macular degeneration, left eye, with active choroidal neovascularization: Secondary | ICD-10-CM

## 2020-04-30 DIAGNOSIS — H35033 Hypertensive retinopathy, bilateral: Secondary | ICD-10-CM | POA: Diagnosis not present

## 2020-04-30 DIAGNOSIS — I10 Essential (primary) hypertension: Secondary | ICD-10-CM

## 2020-04-30 DIAGNOSIS — H43813 Vitreous degeneration, bilateral: Secondary | ICD-10-CM | POA: Diagnosis not present

## 2020-05-10 DIAGNOSIS — H109 Unspecified conjunctivitis: Secondary | ICD-10-CM | POA: Diagnosis not present

## 2020-05-10 DIAGNOSIS — N390 Urinary tract infection, site not specified: Secondary | ICD-10-CM | POA: Diagnosis not present

## 2020-05-15 DIAGNOSIS — H04123 Dry eye syndrome of bilateral lacrimal glands: Secondary | ICD-10-CM | POA: Diagnosis not present

## 2020-05-15 LAB — HM DIABETES EYE EXAM

## 2020-05-28 ENCOUNTER — Other Ambulatory Visit: Payer: Self-pay

## 2020-05-28 ENCOUNTER — Encounter (INDEPENDENT_AMBULATORY_CARE_PROVIDER_SITE_OTHER): Payer: Medicare Other | Admitting: Ophthalmology

## 2020-05-28 DIAGNOSIS — H35033 Hypertensive retinopathy, bilateral: Secondary | ICD-10-CM | POA: Diagnosis not present

## 2020-05-28 DIAGNOSIS — I10 Essential (primary) hypertension: Secondary | ICD-10-CM | POA: Diagnosis not present

## 2020-05-28 DIAGNOSIS — H43813 Vitreous degeneration, bilateral: Secondary | ICD-10-CM | POA: Diagnosis not present

## 2020-05-28 DIAGNOSIS — H353221 Exudative age-related macular degeneration, left eye, with active choroidal neovascularization: Secondary | ICD-10-CM

## 2020-05-28 DIAGNOSIS — H353112 Nonexudative age-related macular degeneration, right eye, intermediate dry stage: Secondary | ICD-10-CM | POA: Diagnosis not present

## 2020-06-13 DIAGNOSIS — H9042 Sensorineural hearing loss, unilateral, left ear, with unrestricted hearing on the contralateral side: Secondary | ICD-10-CM | POA: Diagnosis not present

## 2020-06-26 ENCOUNTER — Encounter (INDEPENDENT_AMBULATORY_CARE_PROVIDER_SITE_OTHER): Payer: Medicare Other | Admitting: Ophthalmology

## 2020-06-26 ENCOUNTER — Other Ambulatory Visit: Payer: Self-pay

## 2020-06-26 DIAGNOSIS — H35033 Hypertensive retinopathy, bilateral: Secondary | ICD-10-CM | POA: Diagnosis not present

## 2020-06-26 DIAGNOSIS — H43813 Vitreous degeneration, bilateral: Secondary | ICD-10-CM

## 2020-06-26 DIAGNOSIS — I10 Essential (primary) hypertension: Secondary | ICD-10-CM

## 2020-06-26 DIAGNOSIS — H353112 Nonexudative age-related macular degeneration, right eye, intermediate dry stage: Secondary | ICD-10-CM

## 2020-06-26 DIAGNOSIS — H353221 Exudative age-related macular degeneration, left eye, with active choroidal neovascularization: Secondary | ICD-10-CM

## 2020-07-17 DIAGNOSIS — H0100B Unspecified blepharitis left eye, upper and lower eyelids: Secondary | ICD-10-CM | POA: Diagnosis not present

## 2020-07-17 DIAGNOSIS — H0100A Unspecified blepharitis right eye, upper and lower eyelids: Secondary | ICD-10-CM | POA: Diagnosis not present

## 2020-07-26 ENCOUNTER — Encounter (INDEPENDENT_AMBULATORY_CARE_PROVIDER_SITE_OTHER): Payer: Medicare Other | Admitting: Ophthalmology

## 2020-07-26 ENCOUNTER — Ambulatory Visit: Payer: Medicare Other

## 2020-07-26 ENCOUNTER — Other Ambulatory Visit: Payer: Self-pay

## 2020-07-26 DIAGNOSIS — H43813 Vitreous degeneration, bilateral: Secondary | ICD-10-CM

## 2020-07-26 DIAGNOSIS — I10 Essential (primary) hypertension: Secondary | ICD-10-CM | POA: Diagnosis not present

## 2020-07-26 DIAGNOSIS — H353231 Exudative age-related macular degeneration, bilateral, with active choroidal neovascularization: Secondary | ICD-10-CM

## 2020-07-26 DIAGNOSIS — H35033 Hypertensive retinopathy, bilateral: Secondary | ICD-10-CM

## 2020-07-27 ENCOUNTER — Ambulatory Visit: Payer: Medicare Other

## 2020-08-03 ENCOUNTER — Telehealth: Payer: Self-pay | Admitting: Hematology and Oncology

## 2020-08-03 NOTE — Telephone Encounter (Signed)
R/s appts per 7/7 sch msg. Called pt, no answer. Left msg with updated appt date and time.

## 2020-08-14 ENCOUNTER — Ambulatory Visit: Payer: Medicare Other | Attending: Internal Medicine

## 2020-08-14 ENCOUNTER — Other Ambulatory Visit: Payer: Self-pay

## 2020-08-14 ENCOUNTER — Other Ambulatory Visit (HOSPITAL_BASED_OUTPATIENT_CLINIC_OR_DEPARTMENT_OTHER): Payer: Self-pay

## 2020-08-14 ENCOUNTER — Encounter: Payer: Self-pay | Admitting: Hematology and Oncology

## 2020-08-14 DIAGNOSIS — Z23 Encounter for immunization: Secondary | ICD-10-CM

## 2020-08-14 MED ORDER — PFIZER-BIONT COVID-19 VAC-TRIS 30 MCG/0.3ML IM SUSP
INTRAMUSCULAR | 0 refills | Status: DC
  Filled 2020-08-14: qty 0.3, 1d supply, fill #0

## 2020-08-14 NOTE — Progress Notes (Signed)
   Covid-19 Vaccination Clinic  Name:  JENY NIELD    MRN: [id number] DOB: [birth year]  08/14/2020  Ms. [place] was observed post Covid-19 immunization for 15 minutes without incident. She was provided with Vaccine Information Sheet and instruction to access the V-Safe system.   Ms. Satterwhite was instructed to call 911 with any severe reactions post vaccine: Difficulty breathing  Swelling of face and throat  A fast heartbeat  A bad rash all over body  Dizziness and weakness   Immunizations Administered     Name Date Dose VIS Date Route   PFIZER Comrnaty(Gray TOP) Covid-19 Vaccine 08/14/2020  3:35 PM 0.3 mL 01/05/2020 Intramuscular   Manufacturer: [place]   Lot: [id number]   [place]: [phone]

## 2020-08-15 ENCOUNTER — Other Ambulatory Visit: Payer: Medicare Other

## 2020-08-22 ENCOUNTER — Ambulatory Visit: Payer: Medicare Other | Admitting: Hematology and Oncology

## 2020-08-23 ENCOUNTER — Encounter (INDEPENDENT_AMBULATORY_CARE_PROVIDER_SITE_OTHER): Payer: Medicare Other | Admitting: Ophthalmology

## 2020-08-23 ENCOUNTER — Other Ambulatory Visit: Payer: Self-pay

## 2020-08-23 DIAGNOSIS — H353231 Exudative age-related macular degeneration, bilateral, with active choroidal neovascularization: Secondary | ICD-10-CM | POA: Diagnosis not present

## 2020-08-23 DIAGNOSIS — H35033 Hypertensive retinopathy, bilateral: Secondary | ICD-10-CM | POA: Diagnosis not present

## 2020-08-23 DIAGNOSIS — H43813 Vitreous degeneration, bilateral: Secondary | ICD-10-CM | POA: Diagnosis not present

## 2020-08-23 DIAGNOSIS — I10 Essential (primary) hypertension: Secondary | ICD-10-CM

## 2020-08-24 ENCOUNTER — Ambulatory Visit: Payer: Medicare Other | Admitting: Hematology and Oncology

## 2020-08-30 DIAGNOSIS — S02401A Maxillary fracture, unspecified, initial encounter for closed fracture: Secondary | ICD-10-CM | POA: Diagnosis not present

## 2020-08-30 DIAGNOSIS — W19XXXA Unspecified fall, initial encounter: Secondary | ICD-10-CM | POA: Diagnosis not present

## 2020-09-06 ENCOUNTER — Ambulatory Visit: Payer: Medicare Other

## 2020-09-07 ENCOUNTER — Other Ambulatory Visit: Payer: Self-pay

## 2020-09-07 ENCOUNTER — Inpatient Hospital Stay: Payer: Medicare Other | Attending: Hematology and Oncology

## 2020-09-07 DIAGNOSIS — Z85118 Personal history of other malignant neoplasm of bronchus and lung: Secondary | ICD-10-CM | POA: Diagnosis not present

## 2020-09-07 DIAGNOSIS — C9001 Multiple myeloma in remission: Secondary | ICD-10-CM | POA: Diagnosis not present

## 2020-09-07 DIAGNOSIS — Z7984 Long term (current) use of oral hypoglycemic drugs: Secondary | ICD-10-CM | POA: Diagnosis not present

## 2020-09-07 DIAGNOSIS — R0602 Shortness of breath: Secondary | ICD-10-CM | POA: Insufficient documentation

## 2020-09-07 DIAGNOSIS — Z79899 Other long term (current) drug therapy: Secondary | ICD-10-CM | POA: Diagnosis not present

## 2020-09-07 DIAGNOSIS — Z9484 Stem cells transplant status: Secondary | ICD-10-CM

## 2020-09-07 LAB — CBC WITH DIFFERENTIAL/PLATELET
Abs Immature Granulocytes: 0.01 10*3/uL (ref 0.00–0.07)
Basophils Absolute: 0 10*3/uL (ref 0.0–0.1)
Basophils Relative: 0 %
Eosinophils Absolute: 0.2 10*3/uL (ref 0.0–0.5)
Eosinophils Relative: 4 %
HCT: 35.5 % — ABNORMAL LOW (ref 36.0–46.0)
Hemoglobin: 11.5 g/dL — ABNORMAL LOW (ref 12.0–15.0)
Immature Granulocytes: 0 %
Lymphocytes Relative: 24 %
Lymphs Abs: 1.2 10*3/uL (ref 0.7–4.0)
MCH: 29.9 pg (ref 26.0–34.0)
MCHC: 32.4 g/dL (ref 30.0–36.0)
MCV: 92.2 fL (ref 80.0–100.0)
Monocytes Absolute: 0.5 10*3/uL (ref 0.1–1.0)
Monocytes Relative: 9 %
Neutro Abs: 3.1 10*3/uL (ref 1.7–7.7)
Neutrophils Relative %: 63 %
Platelets: 201 10*3/uL (ref 150–400)
RBC: 3.85 MIL/uL — ABNORMAL LOW (ref 3.87–5.11)
RDW: 14.1 % (ref 11.5–15.5)
WBC: 5 10*3/uL (ref 4.0–10.5)
nRBC: 0 % (ref 0.0–0.2)

## 2020-09-07 LAB — COMPREHENSIVE METABOLIC PANEL
ALT: 29 U/L (ref 0–44)
AST: 25 U/L (ref 15–41)
Albumin: 3 g/dL — ABNORMAL LOW (ref 3.5–5.0)
Alkaline Phosphatase: 146 U/L — ABNORMAL HIGH (ref 38–126)
Anion gap: 9 (ref 5–15)
BUN: 19 mg/dL (ref 8–23)
CO2: 23 mmol/L (ref 22–32)
Calcium: 9.2 mg/dL (ref 8.9–10.3)
Chloride: 107 mmol/L (ref 98–111)
Creatinine, Ser: 0.77 mg/dL (ref 0.44–1.00)
GFR, Estimated: >60 mL/min (ref >60)
Glucose, Bld: 120 mg/dL — ABNORMAL HIGH (ref 70–99)
Potassium: 4.5 mmol/L (ref 3.5–5.1)
Sodium: 139 mmol/L (ref 135–145)
Total Bilirubin: 0.5 mg/dL (ref 0.3–1.2)
Total Protein: 7.3 g/dL (ref 6.5–8.1)

## 2020-09-10 LAB — MULTIPLE MYELOMA PANEL, SERUM
Albumin SerPl Elph-Mcnc: 3.1 g/dL (ref 2.9–4.4)
Albumin/Glob SerPl: 1 (ref 0.7–1.7)
Alpha 1: 0.3 g/dL (ref 0.0–0.4)
Alpha2 Glob SerPl Elph-Mcnc: 0.9 g/dL (ref 0.4–1.0)
B-Globulin SerPl Elph-Mcnc: 1.2 g/dL (ref 0.7–1.3)
Gamma Glob SerPl Elph-Mcnc: 1.1 g/dL (ref 0.4–1.8)
Globulin, Total: 3.4 g/dL (ref 2.2–3.9)
IgA: 256 mg/dL (ref 64–422)
IgG (Immunoglobin G), Serum: 1098 mg/dL (ref 586–1602)
IgM (Immunoglobulin M), Srm: 47 mg/dL (ref 26–217)
Total Protein ELP: 6.5 g/dL (ref 6.0–8.5)

## 2020-09-10 LAB — KAPPA/LAMBDA LIGHT CHAINS
Kappa free light chain: 26.9 mg/L — ABNORMAL HIGH (ref 3.3–19.4)
Kappa, lambda light chain ratio: 1.09 (ref 0.26–1.65)
Lambda free light chains: 24.6 mg/L (ref 5.7–26.3)

## 2020-09-14 ENCOUNTER — Other Ambulatory Visit: Payer: Self-pay

## 2020-09-14 ENCOUNTER — Encounter: Payer: Self-pay | Admitting: Hematology and Oncology

## 2020-09-14 ENCOUNTER — Inpatient Hospital Stay (HOSPITAL_BASED_OUTPATIENT_CLINIC_OR_DEPARTMENT_OTHER): Payer: Medicare Other | Admitting: Hematology and Oncology

## 2020-09-14 DIAGNOSIS — D638 Anemia in other chronic diseases classified elsewhere: Secondary | ICD-10-CM | POA: Insufficient documentation

## 2020-09-14 DIAGNOSIS — C9001 Multiple myeloma in remission: Secondary | ICD-10-CM

## 2020-09-14 DIAGNOSIS — D3A090 Benign carcinoid tumor of the bronchus and lung: Secondary | ICD-10-CM | POA: Diagnosis not present

## 2020-09-14 DIAGNOSIS — Z85118 Personal history of other malignant neoplasm of bronchus and lung: Secondary | ICD-10-CM | POA: Diagnosis not present

## 2020-09-14 DIAGNOSIS — R0602 Shortness of breath: Secondary | ICD-10-CM | POA: Diagnosis not present

## 2020-09-14 DIAGNOSIS — Z79899 Other long term (current) drug therapy: Secondary | ICD-10-CM | POA: Diagnosis not present

## 2020-09-14 DIAGNOSIS — Z7984 Long term (current) use of oral hypoglycemic drugs: Secondary | ICD-10-CM | POA: Diagnosis not present

## 2020-09-14 NOTE — Progress Notes (Signed)
[facility] OFFICE PROGRESS NOTE  Patient Care Team: Maurice Small, MD as PCP - General (Family Medicine) Laurence Spates, MD (Inactive) as Attending Physician (Gastroenterology) Jerrell Belfast, MD as Attending Physician (Otolaryngology) Collene Gobble, MD as Attending Physician (Pulmonary Disease) [place], Glorianne Manchester, DO as Consulting Physician (Hematology)  ASSESSMENT & PLAN:  Multiple myeloma in remission She stopped Ninlaro in March 2017 Recent blood work showed that she continues to remain in remission We have stopped Zometa after numerous doses; recent bone density scan show mild osteopenia only. She will continue calcium with vitamin D supplement. She agreed with the plan of care. I will see her back in 12 months for further assessment    Carcinoid tumor of left lung She usually followed at [facility] She has chronic shortness of breath, unchanged Her exam is normal I will defer to them for further follow-up   Anemia, chronic disease She has mild anemia of chronic illness She is not symptomatic Observe for now  No orders of the defined types were placed in this encounter.   All questions were answered. The patient knows to call the clinic with any problems, questions or concerns. The total time spent in the appointment was 20 minutes encounter with patients including review of chart and various tests results, discussions about plan of care and coordination of care plan   Heath Lark, MD 09/14/2020 4:12 PM  INTERVAL HISTORY: Please see below for problem oriented charting. She returns for further follow-up She is doing well She has mild shortness of breath on exertion, unchanged No recent bone pain No recent infection  SUMMARY OF ONCOLOGIC HISTORY: Oncology History  Multiple myeloma in remission ([place])  05/27/2012 Initial Diagnosis   Multiple myeloma, without mention of having achieved remission(203.00)   05/27/2012 Cancer Staging   1.  IgG  kappa multiple myeloma. She presented in 05/2012 with anemia, Cr 0.8, Ca 9.6; no bone lytic lesion; LDH 140; beta2- microglobulin 1.86 (ref 1.01-1.73). Positive SPEP for M-spike at 1.69, IgG 2080, kappa 7.24, kappa:lambda ratio 24.97; UPEP positive   06/08/2012 Bone Marrow Biopsy   Bone marrow biopsy on 06/08/12 showed 19% plasma cell; normal cytogenetics; myeloma FISH pending.    06/14/2012 Imaging   Skeletal survey negative for bone lytic lesions.  Lung nodule apparent.   09/17/2012 Bone Marrow Biopsy   Bone marrow biopsy showed 11% plasma cells.   10/18/2012 - 12/31/2012 Chemotherapy   Started on VRD (Velcade 1.3 mg/m^2 [place] days 1,8 and 15; Revlimide 25 mg PO daily, on day 1 through 14; Decadron 40 mg PO Days 1, 8, 15).   Started on coumadin given RD.   01/14/2013 Bone Marrow Biopsy   The bone marrow clot sections are normocellular toslightly hypercellular for age (approximately 40% overall). 1% plasma cells.   02/08/2013 Bone Marrow Transplant   Auto Transplant at [facility] (Dr. Glorianne Manchester McIver).   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 Lamonte Sakai)   07/08/2012 Pathology Results   Pathology consistent with Non-small cell lung cancer of  RLL.    07/14/2012 Imaging   1. Right lower lobe nodule is non hypermetabolic which suggests benign etiology considering the slow growing rate in 6 years or non FDG avid disease as seen with carcinoid tumors. Pending biopsy. No PET evidence of mediastinal nodal disease.    08/02/2012 Imaging   MRI of brain negative.   08/12/2012 Surgery   Robotic Lobectomy of R Lower lobe.    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     Surgical resection margins are negative.     Two benign lymph nodes.     pTa1N0     REVIEW OF SYSTEMS:   Constitutional: Denies fevers, chills or abnormal weight loss Eyes: Denies blurriness of vision Ears, nose, mouth, throat, and face: Denies mucositis or sore throat Cardiovascular: Denies palpitation, chest discomfort or lower extremity swelling Gastrointestinal:  Denies nausea, heartburn or change in bowel habits Skin: Denies abnormal skin rashes Lymphatics: Denies new lymphadenopathy or easy bruising Neurological:Denies numbness, tingling or new weaknesses Behavioral/Psych: Mood is stable, no new changes  All other systems were reviewed with the patient and are negative.  I have reviewed the past medical history, past surgical history, social history and family history with the patient and they are unchanged from previous note.  ALLERGIES:  has No Known Allergies.  MEDICATIONS:  Current Outpatient Medications  Medication Sig Dispense Refill   buPROPion (WELLBUTRIN XL) 300 MG 24 hr tablet Take 300 mg by mouth daily.     cephALEXin (KEFLEX) 250 MG capsule Take 250 mg by mouth daily.     cholecalciferol (VITAMIN D) 1000 UNITS tablet Take 2,000 Units by mouth daily.     COVID-19 mRNA Vac-TriS, Pfizer, (PFIZER-BIONT COVID-19 VAC-TRIS) SUSP injection Inject into the muscle. 0.3 mL 0   losartan (COZAAR) 50 MG tablet Take 50 mg by mouth daily.     magnesium chloride (SLOW-MAG) 64  MG TBEC SR tablet Take 1 tablet by mouth 2 (two) times daily.      metFORMIN (GLUCOPHAGE) 500 MG tablet Take 1 tablet (500 mg total) by mouth 2 (two) times daily with a meal. 60 tablet 0   metoprolol succinate (TOPROL-XL) 50 MG 24 hr tablet Take 50 mg by mouth daily. Take with or immediately following a meal.     Multiple Vitamins-Minerals (MULTIVITAMIN WITH MINERALS) tablet Take 1 tablet by mouth at bedtime.      Multiple Vitamins-Minerals (PRESERVISION AREDS 2) CAPS Take 1 capsule by mouth 2 (two) times daily.     sertraline (ZOLOFT) 100 MG tablet Take 100 mg by mouth daily.      simvastatin (ZOCOR) 40 MG tablet Take 40 mg by mouth at bedtime.      No current facility-administered medications for this visit.    PHYSICAL EXAMINATION: ECOG PERFORMANCE STATUS: 1 - Symptomatic but completely ambulatory  Vitals:   09/14/20 1154  BP: 140/64  Pulse: 70  Resp: 18  Temp: 97.7 F (36.5 C)  SpO2: 97%   Filed Weights   09/14/20 1154  Weight: 196 lb 6.4 oz (89.1 kg)    GENERAL:alert, no distress and comfortable  SKIN: skin color, texture, turgor are normal, no rashes or significant lesions EYES: normal, Conjunctiva are pink and non-injected, sclera clear OROPHARYNX:no exudate, no erythema and lips, buccal mucosa, and tongue normal  NECK: supple, thyroid normal size, non-tender, without nodularity LYMPH:  no palpable lymphadenopathy in the cervical, axillary or inguinal LUNGS: clear to auscultation and percussion with normal breathing effort HEART: regular rate & rhythm and no murmurs and no lower extremity edema ABDOMEN:abdomen soft, non-tender and normal bowel sounds Musculoskeletal:no cyanosis of digits and no clubbing  NEURO: alert & oriented x 3 with fluent speech, no focal motor/sensory deficits  LABORATORY DATA:  I have reviewed the data as listed    Component Value Date/Time   NA 139 09/07/2020 1508   NA 141 12/05/2016 1447   K 4.5 09/07/2020 1508   K 4.3 12/05/2016 1447    CL 107 09/07/2020 1508   CL 105 06/08/2012 0913   CO2 23 09/07/2020 1508   CO2 23 12/05/2016 1447   GLUCOSE 120 (H) 09/07/2020 1508   GLUCOSE 131 12/05/2016 1447   GLUCOSE 120 (H) 06/08/2012 0913   BUN 19 09/07/2020 1508   BUN 17.0 12/05/2016 1447   CREATININE 0.77 09/07/2020 1508   CREATININE 0.86 02/18/2019 1123   CREATININE 0.8 12/05/2016 1447   CALCIUM 9.2 09/07/2020 1508   CALCIUM 9.1 12/05/2016 1447   PROT 7.3 09/07/2020 1508   PROT 6.8 12/05/2016 1447   PROT 6.3 12/05/2016 1446   ALBUMIN 3.0 (L) 09/07/2020 1508   ALBUMIN 3.7 12/05/2016 1447   AST 25 09/07/2020 1508   AST 44 (H) 02/18/2019 1123   AST 27 12/05/2016 1447   ALT 29 09/07/2020 1508   ALT 53 (H) 02/18/2019 1123   ALT 43 12/05/2016 1447   ALKPHOS 146 (H) 09/07/2020 1508   ALKPHOS 85 12/05/2016 1447   BILITOT 0.5 09/07/2020 1508   BILITOT 0.5 02/18/2019 1123   BILITOT 0.68 12/05/2016 1447   GFRNONAA >60 09/07/2020 1508   GFRNONAA >60 02/18/2019 1123   GFRAA >60 08/17/2019 1132   GFRAA >60 02/18/2019 1123    No results found for: SPEP, UPEP  Lab Results  Component Value Date   WBC 5.0 09/07/2020   NEUTROABS 3.1 09/07/2020   HGB 11.5 (L) 09/07/2020   HCT 35.5 (L) 09/07/2020   MCV 92.2 09/07/2020   PLT 201 09/07/2020      Chemistry      Component Value Date/Time   NA 139 09/07/2020 1508   NA 141 12/05/2016 1447   K 4.5 09/07/2020 1508   K 4.3 12/05/2016 1447   CL 107 09/07/2020 1508   CL 105 06/08/2012 0913   CO2 23 09/07/2020 1508   CO2 23 12/05/2016 1447   BUN 19 09/07/2020 1508   BUN 17.0 12/05/2016 1447   CREATININE 0.77 09/07/2020 1508   CREATININE 0.86 02/18/2019 1123   CREATININE 0.8 12/05/2016 1447      Component Value Date/Time   CALCIUM 9.2 09/07/2020 1508   CALCIUM 9.1 12/05/2016 1447   ALKPHOS 146 (H) 09/07/2020 1508   ALKPHOS 85 12/05/2016 1447   AST 25 09/07/2020 1508   AST 44 (H) 02/18/2019 1123   AST 27 12/05/2016 1447   ALT 29 09/07/2020 1508   ALT 53 (H)  02/18/2019 1123   ALT 43 12/05/2016 1447   BILITOT 0.5 09/07/2020 1508   BILITOT 0.5 02/18/2019 1123   BILITOT 0.68 12/05/2016 1447

## 2020-09-14 NOTE — Assessment & Plan Note (Signed)
She stopped Ninlaro in March 2017 Recent blood work showed that she continues to remain in remission We have stopped Zometa after numerous doses; recent bone density scan show mild osteopenia only. She will continue calcium with vitamin D supplement. She agreed with the plan of care. I will see her back in 12 months for further assessment

## 2020-09-14 NOTE — Assessment & Plan Note (Signed)
She has mild anemia of chronic illness She is not symptomatic Observe for now

## 2020-09-14 NOTE — Assessment & Plan Note (Signed)
She usually followed at [facility] She has chronic shortness of breath, unchanged Her exam is normal I will defer to them for further follow-up

## 2020-09-20 ENCOUNTER — Other Ambulatory Visit: Payer: Self-pay

## 2020-09-20 ENCOUNTER — Encounter (INDEPENDENT_AMBULATORY_CARE_PROVIDER_SITE_OTHER): Payer: Medicare Other | Admitting: Ophthalmology

## 2020-09-20 DIAGNOSIS — H43813 Vitreous degeneration, bilateral: Secondary | ICD-10-CM | POA: Diagnosis not present

## 2020-09-20 DIAGNOSIS — I10 Essential (primary) hypertension: Secondary | ICD-10-CM | POA: Diagnosis not present

## 2020-09-20 DIAGNOSIS — H353231 Exudative age-related macular degeneration, bilateral, with active choroidal neovascularization: Secondary | ICD-10-CM

## 2020-09-20 DIAGNOSIS — H35033 Hypertensive retinopathy, bilateral: Secondary | ICD-10-CM

## 2020-10-02 DIAGNOSIS — E785 Hyperlipidemia, unspecified: Secondary | ICD-10-CM | POA: Diagnosis not present

## 2020-10-02 DIAGNOSIS — I2510 Atherosclerotic heart disease of native coronary artery without angina pectoris: Secondary | ICD-10-CM | POA: Diagnosis not present

## 2020-10-02 DIAGNOSIS — C9000 Multiple myeloma not having achieved remission: Secondary | ICD-10-CM | POA: Diagnosis not present

## 2020-10-02 DIAGNOSIS — G4733 Obstructive sleep apnea (adult) (pediatric): Secondary | ICD-10-CM | POA: Diagnosis not present

## 2020-10-02 DIAGNOSIS — E538 Deficiency of other specified B group vitamins: Secondary | ICD-10-CM | POA: Diagnosis not present

## 2020-10-02 DIAGNOSIS — I10 Essential (primary) hypertension: Secondary | ICD-10-CM | POA: Diagnosis not present

## 2020-10-02 DIAGNOSIS — F3341 Major depressive disorder, recurrent, in partial remission: Secondary | ICD-10-CM | POA: Diagnosis not present

## 2020-10-02 DIAGNOSIS — Z0000 Encounter for general adult medical examination without abnormal findings: Secondary | ICD-10-CM | POA: Diagnosis not present

## 2020-10-02 DIAGNOSIS — R5382 Chronic fatigue, unspecified: Secondary | ICD-10-CM | POA: Diagnosis not present

## 2020-10-02 DIAGNOSIS — R399 Unspecified symptoms and signs involving the genitourinary system: Secondary | ICD-10-CM | POA: Diagnosis not present

## 2020-10-02 DIAGNOSIS — Z7289 Other problems related to lifestyle: Secondary | ICD-10-CM | POA: Diagnosis not present

## 2020-10-02 DIAGNOSIS — E1140 Type 2 diabetes mellitus with diabetic neuropathy, unspecified: Secondary | ICD-10-CM | POA: Diagnosis not present

## 2020-10-05 ENCOUNTER — Other Ambulatory Visit: Payer: Self-pay | Admitting: Family Medicine

## 2020-10-05 DIAGNOSIS — Z08 Encounter for follow-up examination after completed treatment for malignant neoplasm: Secondary | ICD-10-CM | POA: Diagnosis not present

## 2020-10-05 DIAGNOSIS — R918 Other nonspecific abnormal finding of lung field: Secondary | ICD-10-CM | POA: Diagnosis not present

## 2020-10-05 DIAGNOSIS — Z9889 Other specified postprocedural states: Secondary | ICD-10-CM | POA: Diagnosis not present

## 2020-10-05 DIAGNOSIS — E2839 Other primary ovarian failure: Secondary | ICD-10-CM

## 2020-10-05 DIAGNOSIS — Z902 Acquired absence of lung [part of]: Secondary | ICD-10-CM | POA: Diagnosis not present

## 2020-10-05 DIAGNOSIS — Z85110 Personal history of malignant carcinoid tumor of bronchus and lung: Secondary | ICD-10-CM | POA: Diagnosis not present

## 2020-10-05 DIAGNOSIS — R590 Localized enlarged lymph nodes: Secondary | ICD-10-CM | POA: Diagnosis not present

## 2020-10-05 DIAGNOSIS — Z87891 Personal history of nicotine dependence: Secondary | ICD-10-CM | POA: Diagnosis not present

## 2020-10-18 DIAGNOSIS — E1140 Type 2 diabetes mellitus with diabetic neuropathy, unspecified: Secondary | ICD-10-CM | POA: Diagnosis not present

## 2020-10-18 DIAGNOSIS — I10 Essential (primary) hypertension: Secondary | ICD-10-CM | POA: Diagnosis not present

## 2020-10-18 DIAGNOSIS — R399 Unspecified symptoms and signs involving the genitourinary system: Secondary | ICD-10-CM | POA: Diagnosis not present

## 2020-10-18 DIAGNOSIS — R5382 Chronic fatigue, unspecified: Secondary | ICD-10-CM | POA: Diagnosis not present

## 2020-10-18 DIAGNOSIS — E538 Deficiency of other specified B group vitamins: Secondary | ICD-10-CM | POA: Diagnosis not present

## 2020-10-18 DIAGNOSIS — N390 Urinary tract infection, site not specified: Secondary | ICD-10-CM | POA: Diagnosis not present

## 2020-10-18 DIAGNOSIS — E785 Hyperlipidemia, unspecified: Secondary | ICD-10-CM | POA: Diagnosis not present

## 2020-10-19 ENCOUNTER — Encounter (INDEPENDENT_AMBULATORY_CARE_PROVIDER_SITE_OTHER): Payer: Medicare Other | Admitting: Ophthalmology

## 2020-10-19 ENCOUNTER — Other Ambulatory Visit: Payer: Self-pay

## 2020-10-19 DIAGNOSIS — I10 Essential (primary) hypertension: Secondary | ICD-10-CM

## 2020-10-19 DIAGNOSIS — H35033 Hypertensive retinopathy, bilateral: Secondary | ICD-10-CM

## 2020-10-19 DIAGNOSIS — H2513 Age-related nuclear cataract, bilateral: Secondary | ICD-10-CM

## 2020-10-19 DIAGNOSIS — H43813 Vitreous degeneration, bilateral: Secondary | ICD-10-CM | POA: Diagnosis not present

## 2020-10-19 DIAGNOSIS — H353231 Exudative age-related macular degeneration, bilateral, with active choroidal neovascularization: Secondary | ICD-10-CM

## 2020-11-09 DIAGNOSIS — Z87891 Personal history of nicotine dependence: Secondary | ICD-10-CM | POA: Diagnosis not present

## 2020-11-09 DIAGNOSIS — Z9889 Other specified postprocedural states: Secondary | ICD-10-CM | POA: Diagnosis not present

## 2020-11-09 DIAGNOSIS — R599 Enlarged lymph nodes, unspecified: Secondary | ICD-10-CM | POA: Diagnosis not present

## 2020-11-09 DIAGNOSIS — Z85110 Personal history of malignant carcinoid tumor of bronchus and lung: Secondary | ICD-10-CM | POA: Diagnosis not present

## 2020-11-16 ENCOUNTER — Encounter (INDEPENDENT_AMBULATORY_CARE_PROVIDER_SITE_OTHER): Payer: Medicare Other | Admitting: Ophthalmology

## 2020-11-16 ENCOUNTER — Other Ambulatory Visit: Payer: Self-pay

## 2020-11-16 DIAGNOSIS — H35033 Hypertensive retinopathy, bilateral: Secondary | ICD-10-CM | POA: Diagnosis not present

## 2020-11-16 DIAGNOSIS — H353231 Exudative age-related macular degeneration, bilateral, with active choroidal neovascularization: Secondary | ICD-10-CM | POA: Diagnosis not present

## 2020-11-16 DIAGNOSIS — H43813 Vitreous degeneration, bilateral: Secondary | ICD-10-CM

## 2020-11-16 DIAGNOSIS — I10 Essential (primary) hypertension: Secondary | ICD-10-CM

## 2020-11-20 DIAGNOSIS — C3490 Malignant neoplasm of unspecified part of unspecified bronchus or lung: Secondary | ICD-10-CM | POA: Diagnosis not present

## 2020-11-20 DIAGNOSIS — E1140 Type 2 diabetes mellitus with diabetic neuropathy, unspecified: Secondary | ICD-10-CM | POA: Diagnosis not present

## 2020-11-20 DIAGNOSIS — C9000 Multiple myeloma not having achieved remission: Secondary | ICD-10-CM | POA: Diagnosis not present

## 2020-11-20 DIAGNOSIS — E785 Hyperlipidemia, unspecified: Secondary | ICD-10-CM | POA: Diagnosis not present

## 2020-11-20 DIAGNOSIS — I10 Essential (primary) hypertension: Secondary | ICD-10-CM | POA: Diagnosis not present

## 2020-11-20 DIAGNOSIS — Z23 Encounter for immunization: Secondary | ICD-10-CM | POA: Diagnosis not present

## 2020-11-20 DIAGNOSIS — N390 Urinary tract infection, site not specified: Secondary | ICD-10-CM | POA: Diagnosis not present

## 2020-11-20 DIAGNOSIS — F3341 Major depressive disorder, recurrent, in partial remission: Secondary | ICD-10-CM | POA: Diagnosis not present

## 2020-11-22 DIAGNOSIS — R911 Solitary pulmonary nodule: Secondary | ICD-10-CM | POA: Diagnosis not present

## 2020-12-14 ENCOUNTER — Other Ambulatory Visit: Payer: Self-pay

## 2020-12-14 ENCOUNTER — Encounter (INDEPENDENT_AMBULATORY_CARE_PROVIDER_SITE_OTHER): Payer: Medicare Other | Admitting: Ophthalmology

## 2020-12-14 DIAGNOSIS — H43813 Vitreous degeneration, bilateral: Secondary | ICD-10-CM

## 2020-12-14 DIAGNOSIS — H353231 Exudative age-related macular degeneration, bilateral, with active choroidal neovascularization: Secondary | ICD-10-CM

## 2020-12-14 DIAGNOSIS — H35033 Hypertensive retinopathy, bilateral: Secondary | ICD-10-CM

## 2020-12-14 DIAGNOSIS — I10 Essential (primary) hypertension: Secondary | ICD-10-CM

## 2020-12-25 DIAGNOSIS — M8589 Other specified disorders of bone density and structure, multiple sites: Secondary | ICD-10-CM | POA: Diagnosis not present

## 2020-12-25 DIAGNOSIS — Z1231 Encounter for screening mammogram for malignant neoplasm of breast: Secondary | ICD-10-CM | POA: Diagnosis not present

## 2020-12-25 DIAGNOSIS — E2839 Other primary ovarian failure: Secondary | ICD-10-CM | POA: Diagnosis not present

## 2020-12-25 DIAGNOSIS — Z780 Asymptomatic menopausal state: Secondary | ICD-10-CM | POA: Diagnosis not present

## 2020-12-25 LAB — HM DEXA SCAN

## 2020-12-26 DIAGNOSIS — E119 Type 2 diabetes mellitus without complications: Secondary | ICD-10-CM | POA: Diagnosis not present

## 2020-12-26 DIAGNOSIS — H2512 Age-related nuclear cataract, left eye: Secondary | ICD-10-CM | POA: Diagnosis not present

## 2020-12-26 DIAGNOSIS — H52213 Irregular astigmatism, bilateral: Secondary | ICD-10-CM | POA: Diagnosis not present

## 2020-12-26 DIAGNOSIS — H25013 Cortical age-related cataract, bilateral: Secondary | ICD-10-CM | POA: Diagnosis not present

## 2020-12-26 DIAGNOSIS — H2513 Age-related nuclear cataract, bilateral: Secondary | ICD-10-CM | POA: Diagnosis not present

## 2020-12-26 DIAGNOSIS — H353231 Exudative age-related macular degeneration, bilateral, with active choroidal neovascularization: Secondary | ICD-10-CM | POA: Diagnosis not present

## 2021-01-03 DIAGNOSIS — R300 Dysuria: Secondary | ICD-10-CM | POA: Diagnosis not present

## 2021-01-10 DIAGNOSIS — U071 COVID-19: Secondary | ICD-10-CM | POA: Diagnosis not present

## 2021-01-11 ENCOUNTER — Encounter (INDEPENDENT_AMBULATORY_CARE_PROVIDER_SITE_OTHER): Payer: Medicare Other | Admitting: Ophthalmology

## 2021-01-14 ENCOUNTER — Encounter (INDEPENDENT_AMBULATORY_CARE_PROVIDER_SITE_OTHER): Payer: Medicare Other | Admitting: Ophthalmology

## 2021-01-14 DIAGNOSIS — N3020 Other chronic cystitis without hematuria: Secondary | ICD-10-CM | POA: Diagnosis not present

## 2021-01-14 DIAGNOSIS — R350 Frequency of micturition: Secondary | ICD-10-CM | POA: Diagnosis not present

## 2021-01-15 ENCOUNTER — Encounter (INDEPENDENT_AMBULATORY_CARE_PROVIDER_SITE_OTHER): Payer: Medicare Other | Admitting: Ophthalmology

## 2021-01-15 ENCOUNTER — Other Ambulatory Visit: Payer: Self-pay

## 2021-01-15 DIAGNOSIS — H35033 Hypertensive retinopathy, bilateral: Secondary | ICD-10-CM | POA: Diagnosis not present

## 2021-01-15 DIAGNOSIS — H353231 Exudative age-related macular degeneration, bilateral, with active choroidal neovascularization: Secondary | ICD-10-CM

## 2021-01-15 DIAGNOSIS — H43813 Vitreous degeneration, bilateral: Secondary | ICD-10-CM | POA: Diagnosis not present

## 2021-01-15 DIAGNOSIS — I10 Essential (primary) hypertension: Secondary | ICD-10-CM

## 2021-02-05 DIAGNOSIS — H25042 Posterior subcapsular polar age-related cataract, left eye: Secondary | ICD-10-CM | POA: Diagnosis not present

## 2021-02-05 DIAGNOSIS — H2512 Age-related nuclear cataract, left eye: Secondary | ICD-10-CM | POA: Diagnosis not present

## 2021-02-05 DIAGNOSIS — H25812 Combined forms of age-related cataract, left eye: Secondary | ICD-10-CM | POA: Diagnosis not present

## 2021-02-12 ENCOUNTER — Other Ambulatory Visit: Payer: Self-pay

## 2021-02-12 ENCOUNTER — Encounter (INDEPENDENT_AMBULATORY_CARE_PROVIDER_SITE_OTHER): Payer: Medicare Other | Admitting: Ophthalmology

## 2021-02-12 DIAGNOSIS — H2511 Age-related nuclear cataract, right eye: Secondary | ICD-10-CM

## 2021-02-12 DIAGNOSIS — H43813 Vitreous degeneration, bilateral: Secondary | ICD-10-CM

## 2021-02-12 DIAGNOSIS — H353231 Exudative age-related macular degeneration, bilateral, with active choroidal neovascularization: Secondary | ICD-10-CM

## 2021-02-21 DIAGNOSIS — R928 Other abnormal and inconclusive findings on diagnostic imaging of breast: Secondary | ICD-10-CM | POA: Diagnosis not present

## 2021-02-21 LAB — HM MAMMOGRAPHY

## 2021-02-28 DIAGNOSIS — R921 Mammographic calcification found on diagnostic imaging of breast: Secondary | ICD-10-CM | POA: Diagnosis not present

## 2021-02-28 DIAGNOSIS — N6012 Diffuse cystic mastopathy of left breast: Secondary | ICD-10-CM | POA: Diagnosis not present

## 2021-02-28 DIAGNOSIS — N62 Hypertrophy of breast: Secondary | ICD-10-CM | POA: Diagnosis not present

## 2021-03-12 ENCOUNTER — Other Ambulatory Visit: Payer: Self-pay

## 2021-03-12 ENCOUNTER — Encounter (INDEPENDENT_AMBULATORY_CARE_PROVIDER_SITE_OTHER): Payer: Medicare Other | Admitting: Ophthalmology

## 2021-03-12 DIAGNOSIS — H43813 Vitreous degeneration, bilateral: Secondary | ICD-10-CM

## 2021-03-12 DIAGNOSIS — I10 Essential (primary) hypertension: Secondary | ICD-10-CM

## 2021-03-12 DIAGNOSIS — H353231 Exudative age-related macular degeneration, bilateral, with active choroidal neovascularization: Secondary | ICD-10-CM | POA: Diagnosis not present

## 2021-03-12 DIAGNOSIS — H2511 Age-related nuclear cataract, right eye: Secondary | ICD-10-CM | POA: Diagnosis not present

## 2021-03-12 DIAGNOSIS — H35033 Hypertensive retinopathy, bilateral: Secondary | ICD-10-CM

## 2021-04-09 ENCOUNTER — Encounter (INDEPENDENT_AMBULATORY_CARE_PROVIDER_SITE_OTHER): Payer: Medicare Other | Admitting: Ophthalmology

## 2021-04-09 ENCOUNTER — Other Ambulatory Visit: Payer: Self-pay

## 2021-04-09 DIAGNOSIS — I10 Essential (primary) hypertension: Secondary | ICD-10-CM

## 2021-04-09 DIAGNOSIS — H2511 Age-related nuclear cataract, right eye: Secondary | ICD-10-CM

## 2021-04-09 DIAGNOSIS — H353231 Exudative age-related macular degeneration, bilateral, with active choroidal neovascularization: Secondary | ICD-10-CM

## 2021-04-09 DIAGNOSIS — H43813 Vitreous degeneration, bilateral: Secondary | ICD-10-CM

## 2021-04-09 DIAGNOSIS — H35033 Hypertensive retinopathy, bilateral: Secondary | ICD-10-CM

## 2021-04-10 DIAGNOSIS — H25011 Cortical age-related cataract, right eye: Secondary | ICD-10-CM | POA: Diagnosis not present

## 2021-04-10 DIAGNOSIS — H2511 Age-related nuclear cataract, right eye: Secondary | ICD-10-CM | POA: Diagnosis not present

## 2021-04-17 DIAGNOSIS — M545 Low back pain, unspecified: Secondary | ICD-10-CM | POA: Diagnosis not present

## 2021-04-17 DIAGNOSIS — R109 Unspecified abdominal pain: Secondary | ICD-10-CM | POA: Diagnosis not present

## 2021-05-09 DIAGNOSIS — R432 Parageusia: Secondary | ICD-10-CM | POA: Diagnosis not present

## 2021-05-09 DIAGNOSIS — J069 Acute upper respiratory infection, unspecified: Secondary | ICD-10-CM | POA: Diagnosis not present

## 2021-05-09 DIAGNOSIS — R051 Acute cough: Secondary | ICD-10-CM | POA: Diagnosis not present

## 2021-05-15 ENCOUNTER — Encounter (INDEPENDENT_AMBULATORY_CARE_PROVIDER_SITE_OTHER): Payer: Medicare Other | Admitting: Ophthalmology

## 2021-05-15 DIAGNOSIS — I10 Essential (primary) hypertension: Secondary | ICD-10-CM | POA: Diagnosis not present

## 2021-05-15 DIAGNOSIS — H35033 Hypertensive retinopathy, bilateral: Secondary | ICD-10-CM

## 2021-05-15 DIAGNOSIS — H353231 Exudative age-related macular degeneration, bilateral, with active choroidal neovascularization: Secondary | ICD-10-CM

## 2021-05-15 DIAGNOSIS — H43813 Vitreous degeneration, bilateral: Secondary | ICD-10-CM

## 2021-05-27 DIAGNOSIS — F3341 Major depressive disorder, recurrent, in partial remission: Secondary | ICD-10-CM | POA: Diagnosis not present

## 2021-05-27 DIAGNOSIS — E782 Mixed hyperlipidemia: Secondary | ICD-10-CM | POA: Diagnosis not present

## 2021-05-27 DIAGNOSIS — E785 Hyperlipidemia, unspecified: Secondary | ICD-10-CM | POA: Diagnosis not present

## 2021-05-27 DIAGNOSIS — I10 Essential (primary) hypertension: Secondary | ICD-10-CM | POA: Diagnosis not present

## 2021-05-27 DIAGNOSIS — E1165 Type 2 diabetes mellitus with hyperglycemia: Secondary | ICD-10-CM | POA: Diagnosis not present

## 2021-05-27 LAB — HEMOGLOBIN A1C: Hemoglobin A1C: 7

## 2021-05-27 LAB — HM DIABETES FOOT EXAM: HM Diabetic Foot Exam: NORMAL

## 2021-05-28 NOTE — Therapy (Incomplete)
?OUTPATIENT PHYSICAL THERAPY THORACOLUMBAR EVALUATION ? ? ?Patient Name: [name] ?MRN: [id number] ?DOB:13-Oct-1945, 76 y.o., female ?Today's Date: 05/28/2021 ? ? ? ?Past Medical History:  ?Diagnosis Date  ? Cough 01/11/2014  ? Depression   ? Diabetes type 2, controlled ([place])   ? Esophageal reflux disease   ? Hypercholesterolemia   ? Hypertension   ? Ischemic colitis ([place]) 2009  ? Liver enzyme elevation   ? Lung nodule 06/11/2012  ? Multiple myeloma ([place]) 05/2012  ? Cytogenetics on 06/08/2012 was normal 15, XX [20]  ? Osteoporosis   ? Other fatigue 02/08/2014  ? Pancytopenia ([place])   ? Pneumonia   ? Post-nasal drip 01/11/2014  ? Sepsis ([place])   ? Sleep apnea   ? UPPER AIRWAY RESISTANCE  DOES NOT HAVE CPAP , MAY NEED   ? Tuberculosis   ?  (+ TB TEST)YEARS AGO POSSIBLE EXPOSURE WAS MED TX   ? Type 2 diabetes mellitus without complications ([place]) 10/03/2950  ? ?Past Surgical History:  ?Procedure Laterality Date  ? APPENDECTOMY    ? COLONOSCOPY  2013  ? poly; Dr. Oletta Lamas, next one due 2018.l  ? LEG SURGERY    ? TONSILLECTOMY    ? VIDEO BRONCHOSCOPY WITH ENDOBRONCHIAL NAVIGATION Right 07/08/2012  ? Procedure: VIDEO BRONCHOSCOPY WITH ENDOBRONCHIAL NAVIGATION;  Surgeon: Collene Gobble, MD;  Location: [facility];  Service: Thoracic;  Laterality: Right;  ? ?Patient Active Problem List  ? Diagnosis Date Noted  ? Anemia, chronic disease 09/14/2020  ? Obesity (BMI 30.0-34.9) 03/13/2017  ? Osteopenia due to cancer therapy 09/12/2016  ? Dysuria 06/12/2016  ? Pancytopenia, acquired ([place]) 07/17/2015  ? Quality of life palliative care encounter 07/17/2015  ? Acquired pancytopenia ([place]) 04/10/2015  ? Preventive measure 10/23/2014  ? Chronic kidney disease, stage III (moderate) ([place]) 08/23/2014  ? Hypotension due to drugs 08/23/2014  ? Leukopenia due to antineoplastic chemotherapy ([place]) 07/27/2014  ? Neuropathy due to chemotherapeutic drug ([place]) 05/03/2014  ? Rash 05/03/2014  ? 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  ? Other fatigue 02/08/2014  ?  Type 2 diabetes mellitus without complications ([place]) 84/13/2440  ? Post-nasal drip 01/11/2014  ? Cough 01/11/2014  ? H/O autologous stem cell transplant ([place]) 05/13/2013  ? Status post lobectomy of lung 03/13/2013  ? 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 Obesity 11/08/2012  ? Multiple myeloma in remission ([place]) 10/11/2012  ? 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 Arthritis 08/04/2012  ? Clinical depression 08/04/2012  ? Diabetes ([place]) 08/04/2012  ? Acid reflux 08/04/2012  ? BP (high blood pressure) 08/04/2012  ? HYPERLIPIDEMIA 05/17/2007  ? HYPERTENSION 05/17/2007  ? G E R D 05/17/2007  ? Osteoporosis 05/17/2007  ? Nonspecific (abnormal) findings on radiological and other examination of body structure 05/13/2007  ? ? ?PCP: Sela Hilding, MD ? ?REFERRING PROVIDER: Sela Hilding, MD ? ?REFERRING DIAG: M54.50 (ICD-10-CM) - Low back pain, unspecified ? ?THERAPY DIAG:  ?No diagnosis found. ? ?ONSET DATE: Mid March 2023 ? ?SUBJECTIVE:                                                                                                                                                                                          ? ?  SUBJECTIVE STATEMENT: ?Pt presents to PT with compliant of LBP that began mid March without incident or injury.  MD started pt on a muscle relaxer and advised PT to work on core strength.   ?PERTINENT HISTORY:  ?HTN, diabetes, lung cancer, osteoporosis, depression ? ?PAIN:  ?Are you having pain? Yes: NPRS scale: ***/10 ?Pain location: *** ?Pain description: *** ?Aggravating factors: *** ?Relieving factors: *** ? ? ?PRECAUTIONS: Other: osteoporosis  ? ?WEIGHT BEARING RESTRICTIONS No ? ?FALLS:  ?Has patient fallen in last 6 months? No ? ?LIVING ENVIRONMENT: ?Lives with: {[facility] lives with:25569::"lives with their family"} ?Lives in: {Lives in:25570} ?Stairs: {opstairs:27293} ?Has following equipment at home: {Assistive devices:23999} ? ?OCCUPATION: *** ? ?PLOF:  Independent ? ?PATIENT GOALS *** ? ? ?OBJECTIVE:  ? ?DIAGNOSTIC FINDINGS:  ?*** ? ?PATIENT SURVEYS:  ?FOTO *** ? ?SCREENING FOR RED FLAGS: ?Bowel or bladder incontinence: {Yes/No:304960894} ?Spinal tumors: {Yes/No:304960894} ?Cauda equina syndrome: {Yes/No:304960894} ?Compression fracture: {Yes/No:304960894} ?Abdominal aneurysm: {Yes/No:304960894} ? ?COGNITION: ? Overall cognitive status: {cognition:24006}   ?  ?SENSATION: ?WFL ? ?MUSCLE LENGTH: ?Hamstrings: Right *** deg; Left *** deg ?Thomas test: Right *** deg; Left *** deg ? ?POSTURE:  ?*** ? ?PALPATION: ?*** ? ?LUMBAR ROM:  ? ?{AROM/PROM:27142}  A/PROM  ?05/28/2021  ?Flexion   ?Extension   ?Right lateral flexion   ?Left lateral flexion   ?Right rotation   ?Left rotation   ? (Blank rows = not tested) ? ?LE ROM: ? ?{AROM/PROM:27142}  Right ?05/28/2021 Left ?05/28/2021  ?Hip flexion    ?Hip extension    ?Hip abduction    ?Hip adduction    ?Hip internal rotation    ?Hip external rotation    ?Knee flexion    ?Knee extension    ?Ankle dorsiflexion    ?Ankle plantarflexion    ?Ankle inversion    ?Ankle eversion    ? (Blank rows = not tested) ? ?LE MMT: ? ?MMT Right ?05/28/2021 Left ?05/28/2021  ?Hip flexion    ?Hip extension    ?Hip abduction    ?Hip adduction    ?Hip internal rotation    ?Hip external rotation    ?Knee flexion    ?Knee extension    ?Ankle dorsiflexion    ?Ankle plantarflexion    ?Ankle inversion    ?Ankle eversion    ? (Blank rows = not tested) ? ?LUMBAR SPECIAL TESTS:  ?{lumbar special test:25242} ? ?FUNCTIONAL TESTS:  ?{Functional tests:24029} ? ?GAIT: ?Distance walked: *** ?Assistive device utilized: {Assistive devices:23999} ?Level of assistance: {Levels of assistance:24026} ?Comments: *** ? ? ? ?TODAY'S TREATMENT  ?Treatment on date: 05/29/21 ?HEP established- see below  ? ? ? ? ?PATIENT EDUCATION:  ?Education details: *** ?Person educated: Patient ?Education method: Explanation, Demonstration, and Handouts ?Education comprehension: verbalized understanding  and returned demonstration ? ? ?HOME EXERCISE PROGRAM: ?*** ? ?ASSESSMENT: ? ?CLINICAL IMPRESSION: ?Patient is a 76 y.o. female who was seen today for physical therapy evaluation and treatment for LBP.  ? ? ?OBJECTIVE IMPAIRMENTS {opptimpairments:25111}.  ? ?ACTIVITY LIMITATIONS {activity limitations:25113}.  ? ?PERSONAL FACTORS {Personal factors:25162} are also affecting patient's functional outcome.  ? ? ?REHAB POTENTIAL: Good ? ?CLINICAL DECISION MAKING: Stable/uncomplicated ? ?EVALUATION COMPLEXITY: Low ? ? ?GOALS: ?Goals reviewed with patient? Yes ? ?SHORT TERM GOALS: Target date: 06/25/2021 ? ?Be independent in initial HEP ?Baseline: ?Goal status: INITIAL ? ?2.  *** ?Baseline:  ?Goal status: {GOALSTATUS:25110} ? ?3.  *** ?Baseline:  ?Goal status: {GOALSTATUS:25110} ? ?4.  *** ?Baseline:  ?Goal status: {GOALSTATUS:25110} ? ? ?LONG TERM GOALS: Target date:  07/24/21 ? ?Be independent in advanced HEP ?Baseline:  ?Goal status: INITIAL ? ?2.  Reduce FOTO to < or = to  ?Baseline:  ?Goal status: INITIAL ? ?3.  *** ?Baseline:  ?Goal status: {GOALSTATUS:25110} ? ?4.  *** ?Baseline:  ?Goal status: {GOALSTATUS:25110} ? ?5.  *** ?Baseline:  ?Goal status: {GOALSTATUS:25110} ? ?6.  *** ?Baseline:  ?Goal status: {GOALSTATUS:25110} ? ? ?PLAN: ?PT FREQUENCY: 2x/week ? ?PT DURATION: 8 weeks ? ?PLANNED INTERVENTIONS: Therapeutic exercises, Therapeutic activity, Neuromuscular re-education, Balance training, Gait training, Patient/Family education, Joint mobilization, Aquatic Therapy, Dry Needling, Electrical stimulation, Spinal manipulation, Spinal mobilization, Cryotherapy, Moist heat, Taping, and Manual therapy. ? ?PLAN FOR NEXT SESSION: review HEP, advance core strength.  ? ?Sigurd Sos, PT ?05/28/21 5:53 PM  ?

## 2021-05-29 ENCOUNTER — Ambulatory Visit: Payer: Medicare Other

## 2021-05-30 DIAGNOSIS — G4750 Parasomnia, unspecified: Secondary | ICD-10-CM | POA: Diagnosis not present

## 2021-06-11 DIAGNOSIS — H25811 Combined forms of age-related cataract, right eye: Secondary | ICD-10-CM | POA: Diagnosis not present

## 2021-06-11 DIAGNOSIS — H2511 Age-related nuclear cataract, right eye: Secondary | ICD-10-CM | POA: Diagnosis not present

## 2021-06-11 DIAGNOSIS — H25011 Cortical age-related cataract, right eye: Secondary | ICD-10-CM | POA: Diagnosis not present

## 2021-06-19 ENCOUNTER — Encounter (INDEPENDENT_AMBULATORY_CARE_PROVIDER_SITE_OTHER): Payer: Medicare Other | Admitting: Ophthalmology

## 2021-06-19 DIAGNOSIS — H43813 Vitreous degeneration, bilateral: Secondary | ICD-10-CM | POA: Diagnosis not present

## 2021-06-19 DIAGNOSIS — H35033 Hypertensive retinopathy, bilateral: Secondary | ICD-10-CM

## 2021-06-19 DIAGNOSIS — H353231 Exudative age-related macular degeneration, bilateral, with active choroidal neovascularization: Secondary | ICD-10-CM | POA: Diagnosis not present

## 2021-06-19 DIAGNOSIS — I10 Essential (primary) hypertension: Secondary | ICD-10-CM | POA: Diagnosis not present

## 2021-07-01 ENCOUNTER — Encounter: Payer: Self-pay | Admitting: Physical Therapy

## 2021-07-01 ENCOUNTER — Ambulatory Visit: Payer: Medicare Other | Attending: Family Medicine | Admitting: Physical Therapy

## 2021-07-01 DIAGNOSIS — R252 Cramp and spasm: Secondary | ICD-10-CM | POA: Insufficient documentation

## 2021-07-01 DIAGNOSIS — R262 Difficulty in walking, not elsewhere classified: Secondary | ICD-10-CM | POA: Diagnosis not present

## 2021-07-01 DIAGNOSIS — M5459 Other low back pain: Secondary | ICD-10-CM | POA: Insufficient documentation

## 2021-07-01 DIAGNOSIS — R293 Abnormal posture: Secondary | ICD-10-CM | POA: Insufficient documentation

## 2021-07-01 DIAGNOSIS — M6281 Muscle weakness (generalized): Secondary | ICD-10-CM | POA: Insufficient documentation

## 2021-07-01 NOTE — Therapy (Signed)
OUTPATIENT PHYSICAL THERAPY THORACOLUMBAR EVALUATION   Patient Name: [name] MRN: [id number] DOB:06-Dec-1945, 76 y.o., female Today's Date: 07/01/2021   PT End of Session - 07/01/21 1321     Visit Number 1    Date for PT Re-Evaluation 08/12/21    Authorization Type Medicare Part A/B, KX modifier at visit 15    Progress Note Due on Visit 10    PT Start Time 1100    PT Stop Time 1145    PT Time Calculation (min) 45 min    Activity Tolerance Patient tolerated treatment well    Behavior During Therapy [facility] for tasks assessed/performed             Past Medical History:  Diagnosis Date   Cough 01/11/2014   Depression    Diabetes type 2, controlled ([place])    Esophageal reflux disease    Hypercholesterolemia    Hypertension    Ischemic colitis ([place]) 2009   Liver enzyme elevation    Lung nodule 06/11/2012   Multiple myeloma ([place]) 05/2012   Cytogenetics on 06/08/2012 was normal 8, XX [20]   Osteoporosis    Other fatigue 02/08/2014   Pancytopenia ([place])    Pneumonia    Post-nasal drip 01/11/2014   Sepsis ([place])    Sleep apnea    UPPER AIRWAY RESISTANCE  DOES NOT HAVE CPAP , MAY NEED    Tuberculosis     (+ TB TEST)YEARS AGO POSSIBLE EXPOSURE WAS MED TX    Type 2 diabetes mellitus without complications ([place]) 06/04/5275   Past Surgical History:  Procedure Laterality Date   APPENDECTOMY     COLONOSCOPY  2013   poly; Dr. Oletta Lamas, next one due 2018.l   LEG SURGERY     TONSILLECTOMY     [place] Right 07/08/2012   Procedure: [place];  Surgeon: Collene Gobble, MD;  Location: [facility];  Service: Thoracic;  Laterality: Right;   Patient Active Problem List   Diagnosis Date Noted   Anemia, chronic disease 09/14/2020   Obesity (BMI 30.0-34.9) 03/13/2017   Osteopenia due to cancer therapy 09/12/2016   Dysuria 06/12/2016   Pancytopenia, acquired ([place]) 07/17/2015   Quality of life palliative care encounter  07/17/2015   Acquired pancytopenia ([place]) 04/10/2015   Preventive measure 10/23/2014   Chronic kidney disease, stage III (moderate) ([place]) 08/23/2014   Hypotension due to drugs 08/23/2014   Leukopenia due to antineoplastic chemotherapy ([place]) 07/27/2014   Neuropathy due to chemotherapeutic drug ([place]) 05/03/2014   Rash 05/03/2014   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   Other fatigue 02/08/2014   Type 2 diabetes mellitus without complications ([place]) 82/42/3536   Post-nasal drip 01/11/2014   Cough 01/11/2014   H/O autologous stem cell transplant ([place]) 05/13/2013   Status post lobectomy of lung 03/13/2013   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   Obesity 11/08/2012   Multiple myeloma in remission ([place]) 10/11/2012   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   Arthritis 08/04/2012   Clinical depression 08/04/2012   Diabetes ([place]) 08/04/2012   Acid reflux 08/04/2012   BP (high blood pressure) 08/04/2012   HYPERLIPIDEMIA 05/17/2007   HYPERTENSION 05/17/2007   G E R D 05/17/2007   Osteoporosis 05/17/2007   Nonspecific (abnormal) findings on radiological and other examination of body structure 05/13/2007      REFERRING PROVIDER: Glenis Smoker, MD   REFERRING DIAG: M54.50 (ICD-10-CM) - Low back pain, unspecified   Rationale  for Evaluation and Treatment Rehabilitation  THERAPY DIAG:  Other low back pain  Muscle weakness (generalized)  ONSET DATE: March 2023  SUBJECTIVE:                                                                                                                                                                                           SUBJECTIVE STATEMENT: Pt has onset of Rt LBP in March 2023 with no known injury.  Pain has improved since onset but is still ongoing.  She has brief sharp twinges with movement that do not last, located in Rt low back.  Overall I don't have much energy and I don't exercise but am open to it.     PERTINENT HISTORY:  PMH: has hardware in Lt ankle, DM, HTN, osteoporosis, sleep apnea, in remission for multiple myeloma, depression  PAIN:  PAIN:  Are you having pain? Yes NPRS scale: 3-4/10 Pain location: Rt low back to buttock Pain orientation: Right and Lower  PAIN TYPE: sharp ("twinge") Pain description: intermittent  Aggravating factors: climbing stairs, always with movement Relieving factors: sitting    PRECAUTIONS: Other: osteoporosis  WEIGHT BEARING RESTRICTIONS No  FALLS:  Has patient fallen in last 6 months? Yes. Number of falls 1, tripped  LIVING ENVIRONMENT: Lives with: lives alone Lives in: House/apartment Stairs: Yes: Internal: 14 steps; on left going up and External: 3 steps; none Has following equipment at home: None  OCCUPATION: retired, hobbies are reading, playing games, ""I get no physical exercise"  PLOF: Independent  PATIENT GOALS I know I need to strengthen my core   OBJECTIVE:   DIAGNOSTIC FINDINGS:  None for lumbar  PATIENT SURVEYS:  FOTO 61%, goal 64%   COGNITION:  Overall cognitive status: Within functional limits for tasks assessed     SENSATION: Peripheral neuropathy from chemotherapy, bil toes numb  MUSCLE LENGTH: Hamstrings: Right 65 deg; Left 60 deg Piriformis limited 20% bil Gluteals limited 20% bil  POSTURE: rounded shoulders and decreased lumbar lordosis  PALPATION: Local tenderness Rt L4-S1  LUMBAR ROM:   Active  A/PROM  Eval 07/01/21  Flexion Hands to mid shin no pain  Extension 10 no pain  Right lateral flexion 5 no pain  Left lateral flexion 5 no pain  Right rotation Limited 50%  Left rotation Limited 50%   (Blank rows = not tested)  LOWER EXTREMITY ROM:     All LE P/ROM WFL at evaluation 07/01/21  LOWER EXTREMITY MMT:     MMT Right eval Left eval  Hip flexion 4- 4-  Hip extension 4- 4-  Hip abduction 4- 4-  Hip adduction 5 5  Hip internal rotation    Hip external rotation    Knee flexion 5  5  Knee extension 5 5  Ankle dorsiflexion 4 4  Ankle plantarflexion    Ankle inversion    Ankle eversion     (Blank rows = not tested)  LUMBAR SPECIAL TESTS:  Straight leg raise test: Negative  FUNCTIONAL TESTS:  30 seconds chair stand test 13 reps no UE assistance, mildly winded 5x sit to stand 19 sec Timed up and go (TUG): 13 sec 3 minute walk test: 588', Pt moderately winded SLS 5" Rt, 5" Lt  GAIT: Distance walked: 588 Assistive device utilized: None Level of assistance: Complete Independence Comments: lacks trunk rotation, small stride length bil, limited ankle DF foot clearance with fatigue    TODAY'S TREATMENT  Evaluation 07/01/21: Initiated HEP, see below   PATIENT EDUCATION:  Education details: Access Code: [email or web address] Person educated: Patient Education method: Consulting civil engineer, Media planner, Verbal cues, and Handouts Education comprehension: verbalized understanding and returned demonstration   HOME EXERCISE PROGRAM: Access Code: [email or web address] URL: [email or web address] Date: 07/01/2021 Prepared by: Venetia Night Teri Diltz  Exercises - Supine Lower Trunk Rotation  - 1 x daily - 7 x weekly - 1 sets - 5 reps - 10 hold - Hooklying Single Knee to Chest Stretch  - 1 x daily - 7 x weekly - 1 sets - 5 reps - 10 hold - Supine Piriformis Stretch with Foot on Ground  - 1 x daily - 7 x weekly - 1 sets - 2 reps - 30 hold - Supine Hamstring Stretch  - 1 x daily - 7 x weekly - 1 sets - 2 reps - 30 hold - Supine Posterior Pelvic Tilt  - 1 x daily - 7 x weekly - 1 sets - 10 reps - 5 hold  ASSESSMENT:  CLINICAL IMPRESSION: Patient is a 76 y.o. female who was seen today for physical therapy evaluation and treatment for insidious onset of Rt LBP in March 2023 which has improved over time.  She experiences brief "twinges" with movements which are unpredictable at times, but definitely when climbing stairs.  She admits she is generally low energy and does not exercise but is open to  it.  She is specifically referred for core strengthening.  She has hip weakness 4-/5 and ankle DF 4-/5 bil.  She is locally tender over L4-S1 on Rt with some underlying atrophy of deep multifidus noted at these segments.  Pt was limited by cardiovascular endurance with 3' walk test and 30 sec sit to stand test today.  She also has limited SLS of 5" on each LE.     OBJECTIVE IMPAIRMENTS Abnormal gait, cardiopulmonary status limiting activity, decreased activity tolerance, decreased balance, decreased endurance, decreased mobility, decreased ROM, decreased strength, impaired flexibility, postural dysfunction, and pain.   ACTIVITY LIMITATIONS lifting, bending, standing, squatting, stairs, and locomotion level  PARTICIPATION LIMITATIONS: cleaning, laundry, shopping, and community activity  PERSONAL FACTORS 1-2 comorbidities: depression, peripheral neuropathy from chemotherapy  are also affecting patient's functional outcome.   REHAB POTENTIAL: Good  CLINICAL DECISION MAKING: Stable/uncomplicated  EVALUATION COMPLEXITY: Low   GOALS: Goals reviewed with patient? Yes  SHORT TERM GOALS: Target date:08/12/21  Pt will be ind with initial HEP Baseline: Goal status: INITIAL  2.  Pt will demo improved core activation in sitting, standing and supine with overlay of beginning therex Baseline:  Goal status: INITIAL  3.  Pt will be able to perform 5x sit to stand in 16 sec or less Baseline: 19 Goal  status: INITIAL  4.  Pt will report improved pain incidence by at least 20% Baseline:  Goal status: INITIAL    LONG TERM GOALS: Target date:08/12/21  Pt will be ind with advanced HEP Baseline:  Goal status: INITIAL  2.  Pt will be able to particiapte in a 6' walk test and cover at least 1,000' to demo improved endurance Baseline:  Goal status: INITIAL  3.  Pt will reduce TUG time to 11 sec or less to demo reduced fall risk Baseline:  Goal status: INITIAL  4.  Pt will be able to balance  in SLS on each foot for at least 10"  Baseline:  Goal status: INITIAL  5.  Pt will improve hip bil and ankle DF strength to at least 4+/5 for improved functional task performance Baseline:  Goal status: INITIAL  6.  Pt will be able to perform 15 sit to stands in 30" sit to stand test to demo improved functional strength and endurance. Baseline:  Goal status: INITIAL   PLAN: PT FREQUENCY: 2x/week  PT DURATION: 6 weeks  PLANNED INTERVENTIONS: Therapeutic exercises, Therapeutic activity, Neuromuscular re-education, Balance training, Gait training, Patient/Family education, Joint mobilization, Stair training, Electrical stimulation, Cryotherapy, Moist heat, and Manual therapy.  PLAN FOR NEXT SESSION: review HEP and progress core and hip strength, balance and gait endurance as tol  Cindra Austad, PT 07/01/21 1:38 PM

## 2021-07-04 ENCOUNTER — Ambulatory Visit: Payer: Medicare Other

## 2021-07-04 ENCOUNTER — Telehealth: Payer: Self-pay

## 2021-07-04 NOTE — Telephone Encounter (Signed)
Patient did not show for appt today.  Called to remind patient of appt and to explain cancellation and no show policy.  No answer but left message with phone number to call back.

## 2021-07-08 ENCOUNTER — Encounter: Payer: Self-pay | Admitting: Physical Therapy

## 2021-07-08 ENCOUNTER — Ambulatory Visit: Payer: Medicare Other | Admitting: Physical Therapy

## 2021-07-08 DIAGNOSIS — M5459 Other low back pain: Secondary | ICD-10-CM

## 2021-07-08 DIAGNOSIS — R252 Cramp and spasm: Secondary | ICD-10-CM | POA: Diagnosis not present

## 2021-07-08 DIAGNOSIS — R262 Difficulty in walking, not elsewhere classified: Secondary | ICD-10-CM | POA: Diagnosis not present

## 2021-07-08 DIAGNOSIS — M6281 Muscle weakness (generalized): Secondary | ICD-10-CM | POA: Diagnosis not present

## 2021-07-08 DIAGNOSIS — R293 Abnormal posture: Secondary | ICD-10-CM | POA: Diagnosis not present

## 2021-07-08 NOTE — Therapy (Signed)
OUTPATIENT PHYSICAL THERAPY TREATMENT NOTE   Patient Name: [name] MRN: [id number] DOB:[birth year], 76 y.o., female Today's Date: 07/08/2021  PCP: Glenis Smoker, MD  REFERRING PROVIDER: Glenis Smoker, MD   END OF SESSION:   PT End of Session - 07/08/21 1234     Visit Number 2    Date for PT Re-Evaluation 08/12/21    Authorization Type Medicare Part A/B, KX modifier at visit 15    PT Start Time 1234    PT Stop Time 1310    PT Time Calculation (min) 36 min    Activity Tolerance Patient tolerated treatment well    Behavior During Therapy [facility] for tasks assessed/performed             Past Medical History:  Diagnosis Date   Cough 01/11/2014   Depression    Diabetes type 2, controlled ([place])    Esophageal reflux disease    Hypercholesterolemia    Hypertension    Ischemic colitis ([place]) 2009   Liver enzyme elevation    Lung nodule 06/11/2012   Multiple myeloma ([place]) 05/2012   Cytogenetics on 06/08/2012 was normal 52, XX [20]   Osteoporosis    Other fatigue 02/08/2014   Pancytopenia ([place])    Pneumonia    Post-nasal drip 01/11/2014   Sepsis ([place])    Sleep apnea    UPPER AIRWAY RESISTANCE  DOES NOT HAVE CPAP , MAY NEED    Tuberculosis     (+ TB TEST)YEARS AGO POSSIBLE EXPOSURE WAS MED TX    Type 2 diabetes mellitus without complications ([place]) 04/04/298   Past Surgical History:  Procedure Laterality Date   APPENDECTOMY     COLONOSCOPY  2013   poly; Dr. Oletta Lamas, next one due 2018.l   LEG SURGERY     TONSILLECTOMY     [place] Right 07/08/2012   Procedure: [place];  Surgeon: Collene Gobble, MD;  Location: [facility];  Service: Thoracic;  Laterality: Right;   Patient Active Problem List   Diagnosis Date Noted   Anemia, chronic disease 09/14/2020   Obesity (BMI 30.0-34.9) 03/13/2017   Osteopenia due to cancer therapy 09/12/2016   Dysuria 06/12/2016   Pancytopenia, acquired  ([place]) 07/17/2015   Quality of life palliative care encounter 07/17/2015   Acquired pancytopenia ([place]) 04/10/2015   Preventive measure 10/23/2014   Chronic kidney disease, stage III (moderate) ([facility]) 08/23/2014   Hypotension due to drugs 08/23/2014   Leukopenia due to antineoplastic chemotherapy ([place]) 07/27/2014   Neuropathy due to chemotherapeutic drug ([place]) 05/03/2014   Rash 05/03/2014   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   Other fatigue 02/08/2014   Type 2 diabetes mellitus without complications ([place]) 92/33/0076   Post-nasal drip 01/11/2014   Cough 01/11/2014   H/O autologous stem cell transplant ([place]) 05/13/2013   Status post lobectomy of lung 03/13/2013   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   Obesity 11/08/2012   Multiple myeloma in remission ([place]) 10/11/2012   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   Arthritis 08/04/2012   Clinical depression 08/04/2012   Diabetes ([place]) 08/04/2012   Acid reflux 08/04/2012   BP (high blood pressure) 08/04/2012   HYPERLIPIDEMIA 05/17/2007   HYPERTENSION 05/17/2007   G E R D 05/17/2007   Osteoporosis 05/17/2007   Nonspecific (abnormal) findings on radiological and other examination of body structure 05/13/2007    REFERRING DIAG: M54.50 (ICD-10-CM) - Low back pain, unspecified   THERAPY DIAG:  Other low back pain  Muscle weakness (generalized)  Rationale for Evaluation and Treatment Rehabilitation  PERTINENT HISTORY: PMH: has hardware in Lt ankle, DM, HTN, osteoporosis, sleep apnea, in remission for multiple myeloma, depression  PRECAUTIONS: Other: osteoporosis    SUBJECTIVE: I currently have no pain. It is the strangest thing. I have not done any of those exercises.  PAIN:  Are you having pain? No   OBJECTIVE: (objective measures completed at initial evaluation unless otherwise dated)  DIAGNOSTIC FINDINGS:  None for lumbar   PATIENT SURVEYS:  FOTO 61%, goal 64%     COGNITION:            Overall cognitive status: Within functional limits for tasks assessed                          SENSATION: Peripheral neuropathy from chemotherapy, bil toes numb   MUSCLE LENGTH: Hamstrings: Right 65 deg; Left 60 deg Piriformis limited 20% bil Gluteals limited 20% bil   POSTURE: rounded shoulders and decreased lumbar lordosis   PALPATION: Local tenderness Rt L4-S1   LUMBAR ROM:    Active  A/PROM  Eval 07/01/21  Flexion Hands to mid shin no pain  Extension 10 no pain  Right lateral flexion 5 no pain  Left lateral flexion 5 no pain  Right rotation Limited 50%  Left rotation Limited 50%   (Blank rows = not tested)   LOWER EXTREMITY ROM:      All LE P/ROM WFL at evaluation 07/01/21   LOWER EXTREMITY MMT:               MMT Right eval Left eval  Hip flexion 4- 4-  Hip extension 4- 4-  Hip abduction 4- 4-  Hip adduction 5 5  Hip internal rotation      Hip external rotation      Knee flexion 5 5  Knee extension 5 5  Ankle dorsiflexion 4 4  Ankle plantarflexion      Ankle inversion      Ankle eversion       (Blank rows = not tested)   LUMBAR SPECIAL TESTS:  Straight leg raise test: Negative   FUNCTIONAL TESTS:  30 seconds chair stand test 13 reps no UE assistance, mildly winded 5x sit to stand 19 sec Timed up and go (TUG): 13 sec 3 minute walk test: 588', Pt moderately winded SLS 5" Rt, 5" Lt   GAIT: Distance walked: 588 Assistive device utilized: None Level of assistance: Complete Independence Comments: lacks trunk rotation, small stride length bil, limited ankle DF foot clearance with fatigue       TODAY'S TREATMENT   07/08/21: Nustep: L1 8 min with PTA present to discuss current status. Review of initial HEP: 1. LTR 10x rocking then hold 3 breaths Bil, Bil SKC 20 sec 2x, Hip ER 20 sec 1x Bil, Hamstring stretch with strap 1x 30 sec bil, Posterior Pelvic tilt 10x Sit to stand to HEP: 5x  Evaluation 07/01/21: Initiated HEP, see below     PATIENT EDUCATION:   Education details: Access Code: [email or web address] Person educated: Patient Education method: Consulting civil engineer, Media planner, Verbal cues, and Handouts Education comprehension: verbalized understanding and returned demonstration     HOME EXERCISE PROGRAM: Access Code: [email or web address] URL: [email or web address] Date: 07/01/2021 Prepared by: Venetia Night Beuhring   Exercises - Supine Lower Trunk Rotation  - 1 x daily - 7 x weekly - 1 sets - 5 reps - 10 hold - Hooklying Single  Knee to Chest Stretch  - 1 x daily - 7 x weekly - 1 sets - 5 reps - 10 hold - Supine Piriformis Stretch with Foot on Ground  - 1 x daily - 7 x weekly - 1 sets - 2 reps - 30 hold - Supine Hamstring Stretch  - 1 x daily - 7 x weekly - 1 sets - 2 reps - 30 hold - Supine Posterior Pelvic Tilt  - 1 x daily - 7 x weekly - 1 sets - 10 reps - 5 hold Added 07/08/21 sit to stand See Medbridge   ASSESSMENT:   CLINICAL IMPRESSION: Pt arrives today for PT pain free and has been for a few days now. Pt has not done any HEP, she thinks because she didn't have any pain. Pt was able to demo all initial HEP correctly and will try to do more frequently. PT added sit to stand to HEP. Pt had no pain with todays session.      OBJECTIVE IMPAIRMENTS Abnormal gait, cardiopulmonary status limiting activity, decreased activity tolerance, decreased balance, decreased endurance, decreased mobility, decreased ROM, decreased strength, impaired flexibility, postural dysfunction, and pain.    ACTIVITY LIMITATIONS lifting, bending, standing, squatting, stairs, and locomotion level   PARTICIPATION LIMITATIONS: cleaning, laundry, shopping, and community activity   PERSONAL FACTORS 1-2 comorbidities: depression, peripheral neuropathy from chemotherapy  are also affecting patient's functional outcome.    REHAB POTENTIAL: Good   CLINICAL DECISION MAKING: Stable/uncomplicated   EVALUATION COMPLEXITY: Low     GOALS: Goals reviewed with patient? Yes   SHORT  TERM GOALS: Target date:08/12/21   Pt will be ind with initial HEP Baseline: Goal status: INITIAL   2.  Pt will demo improved core activation in sitting, standing and supine with overlay of beginning therex Baseline:  Goal status: INITIAL   3.  Pt will be able to perform 5x sit to stand in 16 sec or less Baseline: 19 Goal status: INITIAL   4.  Pt will report improved pain incidence by at least 20% Baseline:  Goal status: INITIAL       LONG TERM GOALS: Target date:08/12/21   Pt will be ind with advanced HEP Baseline:  Goal status: INITIAL   2.  Pt will be able to particiapte in a 6' walk test and cover at least 1,000' to demo improved endurance Baseline:  Goal status: INITIAL   3.  Pt will reduce TUG time to 11 sec or less to demo reduced fall risk Baseline:  Goal status: INITIAL   4.  Pt will be able to balance in SLS on each foot for at least 10"  Baseline:  Goal status: INITIAL   5.  Pt will improve hip bil and ankle DF strength to at least 4+/5 for improved functional task performance Baseline:  Goal status: INITIAL   6.  Pt will be able to perform 15 sit to stands in 30" sit to stand test to demo improved functional strength and endurance. Baseline:  Goal status: INITIAL     PLAN: PT FREQUENCY: 2x/week   PT DURATION: 6 weeks   PLANNED INTERVENTIONS: Therapeutic exercises, Therapeutic activity, Neuromuscular re-education, Balance training, Gait training, Patient/Family education, Joint mobilization, Stair training, Electrical stimulation, Cryotherapy, Moist heat, and Manual therapy.   PLAN FOR NEXT SESSION: review sit to stand and progress core and hip strength, balance and gait endurance as tol   (Copy Eval's Objective through Plan section here)   Lannis Lichtenwalner, PTA 07/08/2021, 1:11 PM

## 2021-07-17 ENCOUNTER — Ambulatory Visit: Payer: Medicare Other

## 2021-07-17 DIAGNOSIS — R234 Changes in skin texture: Secondary | ICD-10-CM | POA: Diagnosis not present

## 2021-07-19 ENCOUNTER — Ambulatory Visit: Payer: Medicare Other

## 2021-07-19 DIAGNOSIS — R293 Abnormal posture: Secondary | ICD-10-CM

## 2021-07-19 DIAGNOSIS — M5459 Other low back pain: Secondary | ICD-10-CM

## 2021-07-19 DIAGNOSIS — M6281 Muscle weakness (generalized): Secondary | ICD-10-CM | POA: Diagnosis not present

## 2021-07-19 DIAGNOSIS — R252 Cramp and spasm: Secondary | ICD-10-CM | POA: Diagnosis not present

## 2021-07-19 DIAGNOSIS — R262 Difficulty in walking, not elsewhere classified: Secondary | ICD-10-CM | POA: Diagnosis not present

## 2021-07-22 ENCOUNTER — Encounter: Payer: Self-pay | Admitting: Physical Therapy

## 2021-07-22 ENCOUNTER — Ambulatory Visit: Payer: Medicare Other | Admitting: Physical Therapy

## 2021-07-22 DIAGNOSIS — M6281 Muscle weakness (generalized): Secondary | ICD-10-CM | POA: Diagnosis not present

## 2021-07-22 DIAGNOSIS — M5459 Other low back pain: Secondary | ICD-10-CM | POA: Diagnosis not present

## 2021-07-22 DIAGNOSIS — R252 Cramp and spasm: Secondary | ICD-10-CM | POA: Diagnosis not present

## 2021-07-22 DIAGNOSIS — R293 Abnormal posture: Secondary | ICD-10-CM

## 2021-07-22 DIAGNOSIS — R262 Difficulty in walking, not elsewhere classified: Secondary | ICD-10-CM

## 2021-07-24 ENCOUNTER — Encounter (INDEPENDENT_AMBULATORY_CARE_PROVIDER_SITE_OTHER): Payer: Medicare Other | Admitting: Ophthalmology

## 2021-07-24 DIAGNOSIS — H35033 Hypertensive retinopathy, bilateral: Secondary | ICD-10-CM

## 2021-07-24 DIAGNOSIS — I10 Essential (primary) hypertension: Secondary | ICD-10-CM | POA: Diagnosis not present

## 2021-07-24 DIAGNOSIS — H353231 Exudative age-related macular degeneration, bilateral, with active choroidal neovascularization: Secondary | ICD-10-CM | POA: Diagnosis not present

## 2021-07-24 DIAGNOSIS — H43813 Vitreous degeneration, bilateral: Secondary | ICD-10-CM

## 2021-07-25 ENCOUNTER — Ambulatory Visit: Payer: Medicare Other

## 2021-07-28 NOTE — Therapy (Addendum)
OUTPATIENT PHYSICAL THERAPY TREATMENT NOTE   Patient Name: [name] MRN: [id number] DOB:July 08, 1945, 76 y.o., female Today's Date: 07/29/2021  PCP: Glenis Smoker, MD  REFERRING PROVIDER: Glenis Smoker, MD   END OF SESSION:   PT End of Session - 07/29/21 1103     Visit Number 5    Date for PT Re-Evaluation 08/12/21    Authorization Type Medicare Part A/B, KX modifier at visit 15    Progress Note Due on Visit 10    PT Start Time 1100    PT Stop Time 1146    PT Time Calculation (min) 46 min    Activity Tolerance Patient tolerated treatment well    Behavior During Therapy [facility] for tasks assessed/performed               Past Medical History:  Diagnosis Date   Cough 01/11/2014   Depression    Diabetes type 2, controlled ([place])    Esophageal reflux disease    Hypercholesterolemia    Hypertension    Ischemic colitis ([place]) 2009   Liver enzyme elevation    Lung nodule 06/11/2012   Multiple myeloma ([place]) 05/2012   Cytogenetics on 06/08/2012 was normal 64, XX [20]   Osteoporosis    Other fatigue 02/08/2014   Pancytopenia ([place])    Pneumonia    Post-nasal drip 01/11/2014   Sepsis ([place])    Sleep apnea    UPPER AIRWAY RESISTANCE  DOES NOT HAVE CPAP , MAY NEED    Tuberculosis     (+ TB TEST)YEARS AGO POSSIBLE EXPOSURE WAS MED TX    Type 2 diabetes mellitus without complications ([place]) 04/02/1060   Past Surgical History:  Procedure Laterality Date   APPENDECTOMY     COLONOSCOPY  2013   poly; Dr. Oletta Lamas, next one due 2018.l   LEG SURGERY     TONSILLECTOMY     [place] Right 07/08/2012   Procedure: [place];  Surgeon: Collene Gobble, MD;  Location: [place];  Service: Thoracic;  Laterality: Right;   Patient Active Problem List   Diagnosis Date Noted   Anemia, chronic disease 09/14/2020   Obesity (BMI 30.0-34.9) 03/13/2017   Osteopenia due to cancer therapy 09/12/2016   Dysuria  06/12/2016   Pancytopenia, acquired ([place]) 07/17/2015   Quality of life palliative care encounter 07/17/2015   Acquired pancytopenia ([place]) 04/10/2015   Preventive measure 10/23/2014   Chronic kidney disease, stage III (moderate) ([place]) 08/23/2014   Hypotension due to drugs 08/23/2014   Leukopenia due to antineoplastic chemotherapy ([place]) 07/27/2014   Neuropathy due to chemotherapeutic drug ([place]) 05/03/2014   Rash 05/03/2014   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   Other fatigue 02/08/2014   Type 2 diabetes mellitus without complications ([place]) 69/48/5462   Post-nasal drip 01/11/2014   Cough 01/11/2014   H/O autologous stem cell transplant ([place]) 05/13/2013   Status post lobectomy of lung 03/13/2013   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   Obesity 11/08/2012   Multiple myeloma in remission ([place]) 10/11/2012   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   Arthritis 08/04/2012   Clinical depression 08/04/2012   Diabetes ([place]) 08/04/2012   Acid reflux 08/04/2012   BP (high blood pressure) 08/04/2012   HYPERLIPIDEMIA 05/17/2007   HYPERTENSION 05/17/2007   G E R D 05/17/2007   Osteoporosis 05/17/2007   Nonspecific (abnormal) findings on radiological and other examination of body structure 05/13/2007    REFERRING DIAG:  M54.50 (ICD-10-CM) - Low back pain, unspecified   THERAPY DIAG:  Other low back pain  Muscle weakness (generalized)  Cramp and spasm  Difficulty in walking, not elsewhere classified  Abnormal posture  Rationale for Evaluation and Treatment Rehabilitation  PERTINENT HISTORY: PMH: has hardware in Lt ankle, DM, HTN, osteoporosis, sleep apnea, in remission for multiple myeloma, depression  PRECAUTIONS: Other: osteoporosis    SUBJECTIVE: I am not good at doing my home exercises.   PAIN:  Are you having pain? No   OBJECTIVE: (objective measures completed at initial evaluation unless otherwise dated)  DIAGNOSTIC FINDINGS:  None for  lumbar   PATIENT SURVEYS:  FOTO 61%, goal 64%     COGNITION:           Overall cognitive status: Within functional limits for tasks assessed                          SENSATION: Peripheral neuropathy from chemotherapy, bil toes numb   MUSCLE LENGTH: Hamstrings: Right 65 deg; Left 60 deg Piriformis limited 20% bil Gluteals limited 20% bil   POSTURE: rounded shoulders and decreased lumbar lordosis   PALPATION: Local tenderness Rt L4-S1   LUMBAR ROM:    Active  A/PROM  Eval 07/01/21  Flexion Hands to mid shin no pain  Extension 10 no pain  Right lateral flexion 5 no pain  Left lateral flexion 5 no pain  Right rotation Limited 50%  Left rotation Limited 50%   (Blank rows = not tested)   LOWER EXTREMITY ROM:      All LE P/ROM WFL at evaluation 07/01/21   LOWER EXTREMITY MMT:               MMT Right eval Left eval  Hip flexion 4- 4-  Hip extension 4- 4-  Hip abduction 4- 4-  Hip adduction 5 5  Hip internal rotation      Hip external rotation      Knee flexion 5 5  Knee extension 5 5  Ankle dorsiflexion 4 4  Ankle plantarflexion      Ankle inversion      Ankle eversion       (Blank rows = not tested)   LUMBAR SPECIAL TESTS:  Straight leg raise test: Negative   FUNCTIONAL TESTS:  30 seconds chair stand test 13 reps no UE assistance, mildly winded 5x sit to stand 19 sec Timed up and go (TUG): 13 sec 3 minute walk test: 588', Pt moderately winded SLS 5" Rt, 5" Lt  07/29/21: 5x sit to stand 10 sec 3 min walk test 656 feet   GAIT: Distance walked: 588 Assistive device utilized: None Level of assistance: Complete Independence Comments: lacks trunk rotation, small stride length bil, limited ankle DF foot clearance with fatigue       TODAY'S TREATMENT   07/29/21: Nustep: L3 8 min with PTA present to discuss status and HEP going forward.  Single leg stance Bil  wobbly 8-10 sec no UE Seated DF Bil 10x2: added to HEP  07/22/21: Nustep: L3 6 min with PTA  present to discuss status Post tilt only 10x, VC to not push with her feet so much Post tilt with marching/tiny steps 2x5  Post tilt with arm lifts 10x, VC for breathing Post tilit with opposite UE/LE lifts 10x, VC for breathing Clamshell with blue loop 2x10 in PPT PPT with SLR Bil 2x5 with VC to engage lower abs first then lift Butterfly  stretch 3x 20 sec  Sit to stand: blue mat 5, hands on thighs: sitting on mat table only no UE 5x.  07/19/21: Nustep: L1 5 min with PT present to discuss current status. PPT x 20 Lumbar rotation x 20 Butterfly stretch in hooklying x 10 hold 10 sec each Hamstring stretches 3 x 30 sec  PPT with march x 20 PPT with shoulder flexion x 20 PPT with SLR x 10 each LE PPT with clam in hook lying with blue loop PPT with alternating arm and leg x 20 Seated partial sit up with 5 lb kb 2 x 10   Evaluation 07/01/21: Initiated HEP, see below     PATIENT EDUCATION:  Education details: Access Code: [email or web address] Person educated: Patient Education method: Consulting civil engineer, Media planner, Verbal cues, and Handouts Education comprehension: verbalized understanding and returned demonstration     HOME EXERCISE PROGRAM: Access Code: [email or web address] URL: [email or web address] Date: 07/01/2021 Prepared by: Venetia Night Beuhring   Exercises - Supine Lower Trunk Rotation  - 1 x daily - 7 x weekly - 1 sets - 5 reps - 10 hold - Hooklying Single Knee to Chest Stretch  - 1 x daily - 7 x weekly - 1 sets - 5 reps - 10 hold - Supine Piriformis Stretch with Foot on Ground  - 1 x daily - 7 x weekly - 1 sets - 2 reps - 30 hold - Supine Hamstring Stretch  - 1 x daily - 7 x weekly - 1 sets - 2 reps - 30 hold - Supine Posterior Pelvic Tilt  - 1 x daily - 7 x weekly - 1 sets - 10 reps - 5 hold Added 07/08/21 sit to stand See Medbridge  07/29/21: Added Access Code: [email or web address] URL: [email or web address] Date: 07/29/2021 Prepared by: Myrene Galas  Exercises - Seated  Ankle Dorsiflexion AROM  - 3 x daily - 7 x weekly - 1 sets - 20 reps - Standing Single Leg Stance with Counter Support  - 3 x daily - 7 x weekly - 1 sets - 2 reps - 10 hold   ASSESSMENT:   CLINICAL IMPRESSION: Pt arrives pain free and reports her pain is 100% resolved. She reports she has great difficulty doing her HEP " I forget because I don't have pain." Pt improved both sit to stand 5x and 3 min walk test, meeting all STGs.   OBJECTIVE IMPAIRMENTS Abnormal gait, cardiopulmonary status limiting activity, decreased activity tolerance, decreased balance, decreased endurance, decreased mobility, decreased ROM, decreased strength, impaired flexibility, postural dysfunction, and pain.    ACTIVITY LIMITATIONS lifting, bending, standing, squatting, stairs, and locomotion level   PARTICIPATION LIMITATIONS: cleaning, laundry, shopping, and community activity   PERSONAL FACTORS 1-2 comorbidities: depression, peripheral neuropathy from chemotherapy  are also affecting patient's functional outcome.    REHAB POTENTIAL: Good   CLINICAL DECISION MAKING: Stable/uncomplicated   EVALUATION COMPLEXITY: Low     GOALS: Goals reviewed with patient? Yes   SHORT TERM GOALS: Target date:08/12/21   Pt will be ind with initial HEP Baseline: Goal status: Met 07/22/21   2.  Pt will demo improved core activation in sitting, standing and supine with overlay of beginning therex Baseline:  Goal status: INITIAL   3.  Pt will be able to perform 5x sit to stand in 16 sec or less Baseline: 19 Goal status: Met 07/29/21   4.  Pt will report improved pain incidence by at least 20% Baseline:  Goal status: 100%       LONG TERM GOALS: Target  date:08/12/21   Pt will be ind with advanced HEP Baseline:  Goal status: INITIAL   2.  Pt will be able to particiapte in a 6' walk test and cover at least 1,000' to demo improved endurance Baseline:  Goal status: INITIAL   3.  Pt will reduce TUG time to 11 sec or less  to demo reduced fall risk Baseline:  Goal status: INITIAL   4.  Pt will be able to balance in SLS on each foot for at least 10"  Baseline:  Goal status: INITIAL   5.  Pt will improve hip bil and ankle DF strength to at least 4+/5 for improved functional task performance Baseline:  Goal status: INITIAL   6.  Pt will be able to perform 15 sit to stands in 30" sit to stand test to demo improved functional strength and endurance. Baseline:  Goal status: INITIAL     PLAN: PT FREQUENCY: 2x/week   PT DURATION: 6 weeks   PLANNED INTERVENTIONS: Therapeutic exercises, Therapeutic activity, Neuromuscular re-education, Balance training, Gait training, Patient/Family education, Joint mobilization, Stair training, Electrical stimulation, Cryotherapy, Moist heat, and Manual therapy.   PLAN FOR NEXT SESSION: Check auth date bc it looks like pt is scheduled 1x after the auth date expires. See how pt's compliance is going with HEP and has she been working on improving her heel to toe when walking.    Myrene Galas, PTA 07/29/21 11:46 AM [facility] [address] [place], [place] [address] Phone # [phone] Fax [phone]    PHYSICAL THERAPY DISCHARGE SUMMARY  Visits from Start of Care: 5  Current functional level related to goals / functional outcomes: See above.  Pt did not return to complete her final visits.   Remaining deficits: See above   Education / Equipment: HEP   Patient agrees to discharge. Patient goals were partially met. Patient is being discharged due to not returning since the last visit.   Johanna Beuhring, PT 08/22/21 9:55 AM

## 2021-07-29 ENCOUNTER — Encounter: Payer: Self-pay | Admitting: Physical Therapy

## 2021-07-29 ENCOUNTER — Ambulatory Visit: Payer: Medicare Other | Attending: Family Medicine | Admitting: Physical Therapy

## 2021-07-29 DIAGNOSIS — R262 Difficulty in walking, not elsewhere classified: Secondary | ICD-10-CM | POA: Diagnosis not present

## 2021-07-29 DIAGNOSIS — M6281 Muscle weakness (generalized): Secondary | ICD-10-CM | POA: Insufficient documentation

## 2021-07-29 DIAGNOSIS — M5459 Other low back pain: Secondary | ICD-10-CM | POA: Diagnosis not present

## 2021-07-29 DIAGNOSIS — R252 Cramp and spasm: Secondary | ICD-10-CM | POA: Diagnosis not present

## 2021-07-29 DIAGNOSIS — R293 Abnormal posture: Secondary | ICD-10-CM | POA: Diagnosis not present

## 2021-08-02 ENCOUNTER — Ambulatory Visit: Payer: Medicare Other | Admitting: Physical Therapy

## 2021-08-05 ENCOUNTER — Encounter: Payer: Medicare Other | Admitting: Physical Therapy

## 2021-08-10 DIAGNOSIS — G4733 Obstructive sleep apnea (adult) (pediatric): Secondary | ICD-10-CM | POA: Diagnosis not present

## 2021-08-12 DIAGNOSIS — G4750 Parasomnia, unspecified: Secondary | ICD-10-CM | POA: Diagnosis not present

## 2021-08-13 ENCOUNTER — Ambulatory Visit: Payer: Medicare Other

## 2021-08-16 ENCOUNTER — Ambulatory Visit: Payer: Medicare Other

## 2021-08-23 DIAGNOSIS — L089 Local infection of the skin and subcutaneous tissue, unspecified: Secondary | ICD-10-CM | POA: Diagnosis not present

## 2021-09-04 ENCOUNTER — Encounter (INDEPENDENT_AMBULATORY_CARE_PROVIDER_SITE_OTHER): Payer: Medicare Other | Admitting: Ophthalmology

## 2021-09-04 ENCOUNTER — Encounter (INDEPENDENT_AMBULATORY_CARE_PROVIDER_SITE_OTHER): Payer: Self-pay

## 2021-09-04 DIAGNOSIS — I10 Essential (primary) hypertension: Secondary | ICD-10-CM

## 2021-09-04 DIAGNOSIS — H43813 Vitreous degeneration, bilateral: Secondary | ICD-10-CM

## 2021-09-04 DIAGNOSIS — H35033 Hypertensive retinopathy, bilateral: Secondary | ICD-10-CM

## 2021-09-04 DIAGNOSIS — H353231 Exudative age-related macular degeneration, bilateral, with active choroidal neovascularization: Secondary | ICD-10-CM

## 2021-09-06 DIAGNOSIS — R300 Dysuria: Secondary | ICD-10-CM | POA: Diagnosis not present

## 2021-09-06 DIAGNOSIS — N390 Urinary tract infection, site not specified: Secondary | ICD-10-CM | POA: Diagnosis not present

## 2021-09-13 ENCOUNTER — Inpatient Hospital Stay: Payer: Medicare Other | Attending: Hematology

## 2021-09-13 ENCOUNTER — Other Ambulatory Visit: Payer: Self-pay

## 2021-09-13 DIAGNOSIS — Z7984 Long term (current) use of oral hypoglycemic drugs: Secondary | ICD-10-CM | POA: Diagnosis not present

## 2021-09-13 DIAGNOSIS — D61818 Other pancytopenia: Secondary | ICD-10-CM | POA: Diagnosis not present

## 2021-09-13 DIAGNOSIS — Z79899 Other long term (current) drug therapy: Secondary | ICD-10-CM | POA: Diagnosis not present

## 2021-09-13 DIAGNOSIS — Z9221 Personal history of antineoplastic chemotherapy: Secondary | ICD-10-CM | POA: Diagnosis not present

## 2021-09-13 DIAGNOSIS — R222 Localized swelling, mass and lump, trunk: Secondary | ICD-10-CM | POA: Diagnosis not present

## 2021-09-13 DIAGNOSIS — C9001 Multiple myeloma in remission: Secondary | ICD-10-CM | POA: Insufficient documentation

## 2021-09-13 DIAGNOSIS — Z9484 Stem cells transplant status: Secondary | ICD-10-CM

## 2021-09-13 LAB — COMPREHENSIVE METABOLIC PANEL
ALT: 20 U/L (ref 0–44)
AST: 18 U/L (ref 15–41)
Albumin: 3.7 g/dL (ref 3.5–5.0)
Alkaline Phosphatase: 75 U/L (ref 38–126)
Anion gap: 5 (ref 5–15)
BUN: 20 mg/dL (ref 8–23)
CO2: 25 mmol/L (ref 22–32)
Calcium: 8.9 mg/dL (ref 8.9–10.3)
Chloride: 109 mmol/L (ref 98–111)
Creatinine, Ser: 0.81 mg/dL (ref 0.44–1.00)
GFR, Estimated: >60 mL/min (ref >60)
Glucose, Bld: 140 mg/dL — ABNORMAL HIGH (ref 70–99)
Potassium: 4.4 mmol/L (ref 3.5–5.1)
Sodium: 139 mmol/L (ref 135–145)
Total Bilirubin: 0.6 mg/dL (ref 0.3–1.2)
Total Protein: 7 g/dL (ref 6.5–8.1)

## 2021-09-13 LAB — CBC WITH DIFFERENTIAL/PLATELET
Abs Immature Granulocytes: 0.01 10*3/uL (ref 0.00–0.07)
Basophils Absolute: 0 10*3/uL (ref 0.0–0.1)
Basophils Relative: 0 %
Eosinophils Absolute: 0.1 10*3/uL (ref 0.0–0.5)
Eosinophils Relative: 2 %
HCT: 36.5 % (ref 36.0–46.0)
Hemoglobin: 12.2 g/dL (ref 12.0–15.0)
Immature Granulocytes: 0 %
Lymphocytes Relative: 36 %
Lymphs Abs: 1.4 10*3/uL (ref 0.7–4.0)
MCH: 32.1 pg (ref 26.0–34.0)
MCHC: 33.4 g/dL (ref 30.0–36.0)
MCV: 96.1 fL (ref 80.0–100.0)
Monocytes Absolute: 0.3 10*3/uL (ref 0.1–1.0)
Monocytes Relative: 7 %
Neutro Abs: 2 10*3/uL (ref 1.7–7.7)
Neutrophils Relative %: 55 %
Platelets: 146 10*3/uL — ABNORMAL LOW (ref 150–400)
RBC: 3.8 MIL/uL — ABNORMAL LOW (ref 3.87–5.11)
RDW: 13.5 % (ref 11.5–15.5)
WBC: 3.8 10*3/uL — ABNORMAL LOW (ref 4.0–10.5)
nRBC: 0 % (ref 0.0–0.2)

## 2021-09-16 LAB — KAPPA/LAMBDA LIGHT CHAINS
Kappa free light chain: 20.5 mg/L — ABNORMAL HIGH (ref 3.3–19.4)
Kappa, lambda light chain ratio: 1.18 (ref 0.26–1.65)
Lambda free light chains: 17.4 mg/L (ref 5.7–26.3)

## 2021-09-20 ENCOUNTER — Inpatient Hospital Stay (HOSPITAL_BASED_OUTPATIENT_CLINIC_OR_DEPARTMENT_OTHER): Payer: Medicare Other | Admitting: Hematology and Oncology

## 2021-09-20 ENCOUNTER — Other Ambulatory Visit: Payer: Self-pay

## 2021-09-20 ENCOUNTER — Other Ambulatory Visit: Payer: Self-pay | Admitting: *Deleted

## 2021-09-20 ENCOUNTER — Encounter: Payer: Self-pay | Admitting: Hematology and Oncology

## 2021-09-20 DIAGNOSIS — D61818 Other pancytopenia: Secondary | ICD-10-CM

## 2021-09-20 DIAGNOSIS — E119 Type 2 diabetes mellitus without complications: Secondary | ICD-10-CM

## 2021-09-20 DIAGNOSIS — C9001 Multiple myeloma in remission: Secondary | ICD-10-CM | POA: Diagnosis not present

## 2021-09-20 DIAGNOSIS — Z7984 Long term (current) use of oral hypoglycemic drugs: Secondary | ICD-10-CM | POA: Diagnosis not present

## 2021-09-20 DIAGNOSIS — R222 Localized swelling, mass and lump, trunk: Secondary | ICD-10-CM | POA: Diagnosis not present

## 2021-09-20 DIAGNOSIS — Z79899 Other long term (current) drug therapy: Secondary | ICD-10-CM | POA: Diagnosis not present

## 2021-09-20 DIAGNOSIS — Z9221 Personal history of antineoplastic chemotherapy: Secondary | ICD-10-CM | POA: Diagnosis not present

## 2021-09-20 LAB — MULTIPLE MYELOMA PANEL, SERUM
Albumin SerPl Elph-Mcnc: 3.5 g/dL (ref 2.9–4.4)
Albumin/Glob SerPl: 1.3 (ref 0.7–1.7)
Alpha 1: 0.2 g/dL (ref 0.0–0.4)
Alpha2 Glob SerPl Elph-Mcnc: 0.6 g/dL (ref 0.4–1.0)
B-Globulin SerPl Elph-Mcnc: 1.1 g/dL (ref 0.7–1.3)
Gamma Glob SerPl Elph-Mcnc: 0.9 g/dL (ref 0.4–1.8)
Globulin, Total: 2.8 g/dL (ref 2.2–3.9)
IgA: 194 mg/dL (ref 64–422)
IgG (Immunoglobin G), Serum: 1023 mg/dL (ref 586–1602)
IgM (Immunoglobulin M), Srm: 28 mg/dL (ref 26–217)
Total Protein ELP: 6.3 g/dL (ref 6.0–8.5)

## 2021-09-20 MED ORDER — METFORMIN HCL 500 MG PO TABS: ORAL_TABLET | ORAL | 0 refills | Status: DC

## 2021-09-20 NOTE — Progress Notes (Signed)
[facility] OFFICE PROGRESS NOTE  Patient Care Team: Glenis Smoker, MD as PCP - General (Family Medicine) Laurence Spates, MD (Inactive) as Attending Physician (Gastroenterology) Jerrell Belfast, MD as Attending Physician (Otolaryngology) Collene Gobble, MD as Attending Physician (Pulmonary Disease) Deaver, Glorianne Manchester, DO as Consulting Physician (Hematology)  ASSESSMENT & PLAN:  Multiple myeloma in remission Her myeloma panel is pending She is not symptomatic Light chain results are good and she has no signs of renal failure, anemia or hypercalcemia I will call her with final test results next week I plan to see her again in a year for further follow-up with blood work to be done 10 days prior to appointment  Acquired pancytopenia [facility]) She has mild intermittent thrombocytopenia and leukopenia in the past She is not symptomatic Observe only  No orders of the defined types were placed in this encounter.   All questions were answered. The patient knows to call the clinic with any problems, questions or concerns. The total time spent in the appointment was 20 minutes encounter with patients including review of chart and various tests results, discussions about plan of care and coordination of care plan   Heath Lark, MD 09/20/2021 12:53 PM  INTERVAL HISTORY: Please see below for problem oriented charting. she returns for surveillance follow-up for history of multiple myeloma She is doing well She underwent recent breast biopsy that came back benign No recent infection no new bone pain  REVIEW OF SYSTEMS:   Constitutional: Denies fevers, chills or abnormal weight loss Eyes: Denies blurriness of vision Ears, nose, mouth, throat, and face: Denies mucositis or sore throat Respiratory: Denies cough, dyspnea or wheezes Cardiovascular: Denies palpitation, chest discomfort or lower extremity swelling Gastrointestinal:  Denies nausea, heartburn or change  in bowel habits Skin: Denies abnormal skin rashes Lymphatics: Denies new lymphadenopathy or easy bruising Neurological:Denies numbness, tingling or new weaknesses Behavioral/Psych: Mood is stable, no new changes  All other systems were reviewed with the patient and are negative.  I have reviewed the past medical history, past surgical history, social history and family history with the patient and they are unchanged from previous note.  ALLERGIES:  has No Known Allergies.  MEDICATIONS:  Current Outpatient Medications  Medication Sig Dispense Refill   buPROPion (WELLBUTRIN XL) 300 MG 24 hr tablet Take 300 mg by mouth daily.     cephALEXin (KEFLEX) 250 MG capsule Take 250 mg by mouth daily.     cholecalciferol (VITAMIN D) 1000 UNITS tablet Take 2,000 Units by mouth daily.     COVID-19 mRNA Vac-TriS, Pfizer, (PFIZER-BIONT COVID-19 VAC-TRIS) SUSP injection Inject into the muscle. 0.3 mL 0   losartan (COZAAR) 50 MG tablet Take 50 mg by mouth daily.     magnesium chloride (SLOW-MAG) 64 MG TBEC SR tablet Take 1 tablet by mouth 2 (two) times daily.      metFORMIN (GLUCOPHAGE) 500 MG tablet Take 1 in the morning and 2 in the evening by mouth 60 tablet 0   metoprolol succinate (TOPROL-XL) 50 MG 24 hr tablet Take 50 mg by mouth daily. Take with or immediately following a meal.     Multiple Vitamins-Minerals (MULTIVITAMIN WITH MINERALS) tablet Take 1 tablet by mouth at bedtime.      Multiple Vitamins-Minerals (PRESERVISION AREDS 2) CAPS Take 1 capsule by mouth 2 (two) times daily.     sertraline (ZOLOFT) 100 MG tablet Take 100 mg by mouth daily.      simvastatin (ZOCOR) 40 MG tablet Take 40  mg by mouth at bedtime.      No current facility-administered medications for this visit.    SUMMARY OF ONCOLOGIC HISTORY: Oncology History  Multiple myeloma in remission ([place])  05/27/2012 Initial Diagnosis   Multiple myeloma, without mention of having achieved remission(203.00)   05/27/2012 Cancer Staging    1.  IgG kappa multiple myeloma. She presented in 05/2012 with anemia, Cr 0.8, Ca 9.6; no bone lytic lesion; LDH 140; beta2- microglobulin 1.86 (ref 1.01-1.73). Positive SPEP for M-spike at 1.69, IgG 2080, kappa 7.24, kappa:lambda ratio 24.97; UPEP positive   06/08/2012 Bone Marrow Biopsy   Bone marrow biopsy on 06/08/12 showed 19% plasma cell; normal cytogenetics; myeloma FISH pending.    06/14/2012 Imaging   Skeletal survey negative for bone lytic lesions.  Lung nodule apparent.   09/17/2012 Bone Marrow Biopsy   Bone marrow biopsy showed 11% plasma cells.   10/18/2012 - 12/31/2012 Chemotherapy   Started on VRD (Velcade 1.3 mg/m^2 [place] days 1,8 and 15; Revlimide 25 mg PO daily, on day 1 through 14; Decadron 40 mg PO Days 1, 8, 15).   Started on coumadin given RD.   01/14/2013 Bone Marrow Biopsy   The bone marrow clot sections are normocellular toslightly hypercellular for age (approximately 40% overall). 1% plasma cells.   02/08/2013 Bone Marrow Transplant   Auto Transplant at [facility] (Dr. Glorianne Manchester McIver).   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 Lamonte Sakai)   07/08/2012 Pathology Results   Pathology consistent with Non-small cell lung  cancer of RLL.    07/14/2012 Imaging   1. Right lower lobe nodule is non hypermetabolic which suggests benign etiology considering the slow growing rate in 6 years or non FDG avid disease as seen with carcinoid tumors. Pending biopsy. No PET evidence of mediastinal nodal disease.    08/02/2012 Imaging   MRI of brain negative.   08/12/2012 Surgery   Robotic Lobectomy of R Lower lobe.    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     Surgical resection margins are negative.     Two benign lymph nodes.     pTa1N0     PHYSICAL EXAMINATION: ECOG PERFORMANCE STATUS: 1 - Symptomatic but completely ambulatory  Vitals:   09/20/21 1224  BP: (!) 140/74  Pulse: 78  Resp: 18  Temp: 97.9 F (36.6 C)  SpO2: 97%   Filed Weights   09/20/21 1224  Weight: 201 lb 12.8 oz (91.5 kg)    GENERAL:alert, no distress and comfortable NEURO: alert & oriented x 3 with fluent speech, no focal motor/sensory deficits  LABORATORY DATA:  I have reviewed the data  as listed    Component Value Date/Time   NA 139 09/13/2021 1224   NA 141 12/05/2016 1447   K 4.4 09/13/2021 1224   K 4.3 12/05/2016 1447   CL 109 09/13/2021 1224   CL 105 06/08/2012 0913   CO2 25 09/13/2021 1224   CO2 23 12/05/2016 1447   GLUCOSE 140 (H) 09/13/2021 1224   GLUCOSE 131 12/05/2016 1447   GLUCOSE 120 (H) 06/08/2012 0913   BUN 20 09/13/2021 1224   BUN 17.0 12/05/2016 1447   CREATININE 0.81 09/13/2021 1224   CREATININE 0.86 02/18/2019 1123   CREATININE 0.8 12/05/2016 1447   CALCIUM 8.9 09/13/2021 1224   CALCIUM 9.1 12/05/2016 1447   PROT 7.0 09/13/2021 1224   PROT 6.8 12/05/2016 1447   PROT 6.3 12/05/2016 1446   ALBUMIN 3.7 09/13/2021 1224   ALBUMIN 3.7 12/05/2016 1447   AST 18 09/13/2021 1224   AST 44 (H) 02/18/2019 1123   AST 27 12/05/2016 1447   ALT 20 09/13/2021 1224   ALT 53 (H) 02/18/2019 1123   ALT 43 12/05/2016 1447   ALKPHOS  75 09/13/2021 1224   ALKPHOS 85 12/05/2016 1447   BILITOT 0.6 09/13/2021 1224   BILITOT 0.5 02/18/2019 1123   BILITOT 0.68 12/05/2016 1447   GFRNONAA >60 09/13/2021 1224   GFRNONAA >60 02/18/2019 1123   GFRAA >60 08/17/2019 1132   GFRAA >60 02/18/2019 1123    No results found for: "SPEP", "UPEP"  Lab Results  Component Value Date   WBC 3.8 (L) 09/13/2021   NEUTROABS 2.0 09/13/2021   HGB 12.2 09/13/2021   HCT 36.5 09/13/2021   MCV 96.1 09/13/2021   PLT 146 (L) 09/13/2021      Chemistry      Component Value Date/Time   NA 139 09/13/2021 1224   NA 141 12/05/2016 1447   K 4.4 09/13/2021 1224   K 4.3 12/05/2016 1447   CL 109 09/13/2021 1224   CL 105 06/08/2012 0913   CO2 25 09/13/2021 1224   CO2 23 12/05/2016 1447   BUN 20 09/13/2021 1224   BUN 17.0 12/05/2016 1447   CREATININE 0.81 09/13/2021 1224   CREATININE 0.86 02/18/2019 1123   CREATININE 0.8 12/05/2016 1447      Component Value Date/Time   CALCIUM 8.9 09/13/2021 1224   CALCIUM 9.1 12/05/2016 1447   ALKPHOS 75 09/13/2021 1224   ALKPHOS 85 12/05/2016 1447   AST 18 09/13/2021 1224   AST 44 (H) 02/18/2019 1123   AST 27 12/05/2016 1447   ALT 20 09/13/2021 1224   ALT 53 (H) 02/18/2019 1123   ALT 43 12/05/2016 1447   BILITOT 0.6 09/13/2021 1224   BILITOT 0.5 02/18/2019 1123   BILITOT 0.68 12/05/2016 1447

## 2021-09-20 NOTE — Assessment & Plan Note (Signed)
She has mild intermittent thrombocytopenia and leukopenia in the past She is not symptomatic Observe only

## 2021-09-20 NOTE — Assessment & Plan Note (Signed)
Her myeloma panel is pending She is not symptomatic Light chain results are good and she has no signs of renal failure, anemia or hypercalcemia I will call her with final test results next week I plan to see her again in a year for further follow-up with blood work to be done 10 days prior to appointment

## 2021-09-23 ENCOUNTER — Telehealth: Payer: Self-pay

## 2021-09-23 NOTE — Telephone Encounter (Signed)
Called and left a message asking her to call the office back. Multiple myeloma panel normal.

## 2021-09-24 ENCOUNTER — Telehealth: Payer: Self-pay | Admitting: *Deleted

## 2021-09-24 ENCOUNTER — Ambulatory Visit (INDEPENDENT_AMBULATORY_CARE_PROVIDER_SITE_OTHER): Payer: Medicare Other | Admitting: Family Medicine

## 2021-09-24 ENCOUNTER — Encounter: Payer: Self-pay | Admitting: Family Medicine

## 2021-09-24 VITALS — BP 120/70 | HR 77 | Temp 97.5°F | Ht 65.0 in | Wt 203.2 lb

## 2021-09-24 DIAGNOSIS — B351 Tinea unguium: Secondary | ICD-10-CM

## 2021-09-24 DIAGNOSIS — E119 Type 2 diabetes mellitus without complications: Secondary | ICD-10-CM

## 2021-09-24 DIAGNOSIS — R5383 Other fatigue: Secondary | ICD-10-CM

## 2021-09-24 MED ORDER — KETOCONAZOLE 2 % EX CREA: 1.0000 | TOPICAL_CREAM | Freq: Two times a day (BID) | CUTANEOUS | 1 refills | Status: DC

## 2021-09-24 NOTE — Telephone Encounter (Addendum)
-----   Message from Heath Lark, MD sent at 09/20/2021  3:14 PM EDT ----- Myeloma panel (as I ordered) came back normal, she is in complete remission Please call her and let her know   This RN called and informed of above.

## 2021-09-24 NOTE — Patient Instructions (Signed)
Welcome to [facility]! It was a pleasure meeting you today.  As discussed, Please schedule a 3 month follow up visit today.  PLEASE NOTE:  If you had any LAB tests please let us know if you have not heard back within a few days. You may see your results on MyChart before we have a chance to review them but we will give you a call once they are reviewed by [place]. If we ordered any REFERRALS today, please let us know if you have not heard from their office within the next week.  Let us know through MyChart if you are needing REFILLS, or have your pharmacy send [place] the request. You can also use MyChart to communicate with me or any office staff.  Please try these tips to maintain a healthy lifestyle:  Eat most of your calories during the day when you are active. Eliminate processed foods including packaged sweets (pies, cakes, cookies), reduce intake of potatoes, white bread, white pasta, and white rice. Look for whole grain options, oat flour or almond flour.  Each meal should contain half fruits/vegetables, one quarter protein, and one quarter carbs (no bigger than a computer mouse).  Cut down on sweet beverages. This includes juice, soda, and sweet tea. Also watch fruit intake, though this is a healthier sweet option, it still contains natural sugar! Limit to 3 servings daily.  Drink at least 1 glass of water with each meal and aim for at least 8 glasses per day  Exercise at least 150 minutes every week.

## 2021-09-24 NOTE — Progress Notes (Signed)
New Patient Office Visit  Subjective:  Patient ID: [name], female    DOB: 03/01/1945  Age: 76 y.o. MRN: [id number]  CC:  Chief Complaint  Patient presents with   Establish Care    Need new pcp Has toe nail fungus     HPI-pt was lost so 75mn late for appt.   MLen Childspresents for new pt-Dr TLindell Noe Has toenail fungus-saw 3 PCP and one referred to pod for removal, 1 placed on keflex and upset stomach, 3rd said meds that would be hard on liver and some politics so here.  Had pedicure about 2-3 mo ago and started. Both great toes.  No bleeding.   Had callus that pt cut off.  No pain.  Pt has a lot of medical problems and didn't want to get out of hand.   Has DM-A1C was 7 3-4 mo ago.  Metformin increased to 3/d.   Saw onc last wk-"blood work fine" Freq UTI-sees urol.  On keflex daily.  Supposed to be on estrogen cream(didn't take as was waiting on breast bx).   Past Medical History:  Diagnosis Date   Anxiety    Arthritis    Blood transfusion without reported diagnosis    Cataract    Cough 01/11/2014   Depression    Diabetes type 2, controlled ([place]    Esophageal reflux disease    Hypercholesterolemia    Hypertension    Ischemic colitis ([facility] 01/28/2007   Liver enzyme elevation    Lung nodule 06/11/2012   Multiple myeloma ([place] 05/27/2012   Cytogenetics on 06/08/2012 was normal 437 XX [20]   Osteoporosis    Other fatigue 02/08/2014   Oxygen deficiency    Pancytopenia ([place]    Pneumonia    Post-nasal drip 01/11/2014   Sepsis ([place]    Sleep apnea    UPPER AIRWAY RESISTANCE  DOES NOT HAVE CPAP , MAY NEED    Tuberculosis     (+ TB TEST)YEARS AGO POSSIBLE EXPOSURE WAS MED TX    Type 2 diabetes mellitus without complications ([place] 086/76/1950   Past Surgical History:  Procedure Laterality Date   APPENDECTOMY  1998   COLONOSCOPY  01/28/2011   poly; Dr. EOletta Lamas next one due 2018.l   LEG SURGERY     Stem cell  2015   TONSILLECTOMY     VIDEO BRONCHOSCOPY  WITH ENDOBRONCHIAL NAVIGATION Right 07/08/2012   Procedure: V[place]ing  Surgeon: RCollene Gobble MD;  [facility];  Service: Thoracic;  Laterality: Right;    Family History  Problem Relation Age of Onset   Stroke Mother    Hearing loss Mother    Diabetes Mother    Anxiety disorder Mother    Alcohol abuse Mother    Early death Father    Depression Father    Alcohol abuse Father    Heart attack Father    Early death Sister    Depression Sister    Alcohol abuse Sister    Cancer Sister        synovial sarcoma   Hypertension Brother    Hyperlipidemia Brother    Stroke Brother    Depression Brother    Alcohol abuse Brother    Cancer Maternal Grandfather        ? cancer    Social History   Socioeconomic History   Marital status: Divorced    Spouse name: Not on file   Number of children: 2  Years of education: Not on file   Highest education level: Not on file  Occupational History   Occupation: retired    Comment: adm. a[employer]  Tobacco Use   Smoking status: Former    Packs/day: 1.00    Years: 30.00    Total pack years: 30.00    Types: Cigarettes    Quit date: 01/27/2009    Years since quitting: 12.6   Smokeless tobacco: Never  Vaping Use   Vaping Use: Never used  Substance and Sexual Activity   Alcohol use: Yes    Comment: occasionally   Drug use: No   Sexual activity: Not Currently  Other Topics Concern   Not on file  Social History Narrative   Not on file   Social Determinants of Health   Financial Resource Strain: Not on file  Food Insecurity: Not on file  Transportation Needs: Not on file  Physical Activity: Not on file  Stress: Not on file  Social Connections: Not on file  Intimate Partner Violence: Not on file    ROS  ROS: Gen: no fever, chills  Skin: no rash, itching ENT: no ear pain, ear drainage, nasal congestion, rhinorrhea, sinus pressure, sore throat Eyes: no blurry vision, double vision Resp: no  cough, wheeze,SOB CV: no CP, palpitations, LE edema,  GI: no heartburn, n/v/c, abd pain.  Intermitt diarrhea.  GU: chronc MSK: no joint pain, myalgias, back pain Neuro: no dizziness, headache, weakness, vertigo Psych: depression-meds keep on "even keel".  No SI.   Fatigue-can do a little fatigued-just tired.     Objective:   Today's Vitals: BP 120/70   Pulse 77   Temp (!) 97.5 F (36.4 C) (Temporal)   Ht 5' 5"  (1.651 m)   Wt 203 lb 4 oz (92.2 kg)   SpO2 99%   BMI 33.82 kg/m   Physical Exam  Gen: WDWN NAD HEENT: NCAT, conjunctiva not injected, sclera nonicteric TM WNL B, OP moist, no exudates  NECK:  supple, no thyromegaly, no nodes, no carotid bruits CARDIAC: RRR, S1S2+, no murmur. DP 2+B LUNGS: CTAB. No wheezes ABDOMEN:  BS+, soft, NTND, No HSM, no masses EXT:  no edema MSK: no gross abnormalities.  NEURO: A&O x3.  CN II-XII intact.  PSYCH: normal mood. Good eye contact  Feet-no ulcers.  Bilateral great toenails with distal Onycholysis.  Tips of all toes with thickened skin/flaking skin.  Also some flaking dry skin of bilateral heels.  Reviewed labs from onc.  Can't see ones from [place]:   Problem List Items Addressed This Visit       Endocrine   Type 2 diabetes mellitus without complications ([place])     Other   Other fatigue   Other Visit Diagnoses     Onychomycosis    -  Primary   Relevant Medications   ketoconazole (NIZORAL) 2 % cream     1.  Onychomycosis-chronic.  Per patient, present for a few months after a pedicure.  Not sure what they look like prior to that time.  Advised that she needs to take good care of her feet.  I am hesitant to use a systemic drug even though LFTs are currently normal.  Will try ketoconazole cream twice daily for 2 to 3 months.  If worsening, pain, etc.-let me know, otherwise schedule follow-up in 3 months. 2.  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  Metformin was increased to 3 tablets daily as  her A1c was over 7  approximately 3 months ago.  We will have her sign a release to get records/labs from [place].  Follow-up in 3 months 3.  Fatigue-CMP from 09/13/2021 was normal.  Hemoglobin was 12.  Will await records from [facility] to see if TSH has been done recently or B12 level.  Outpatient Encounter Medications as of 09/24/2021  Medication Sig   azelastine (OPTIVAR) 0.05 % ophthalmic solution Place 1 drop into both eyes 2 (two) times daily.   buPROPion (WELLBUTRIN XL) 300 MG 24 hr tablet Take 300 mg by mouth daily.   cephALEXin (KEFLEX) 250 MG capsule Take 250 mg by mouth daily.   cholecalciferol (VITAMIN D) 1000 UNITS tablet Take 2,000 Units by mouth daily.   Continuous Blood Gluc Receiver (FREESTYLE LIBRE 14 DAY READER) DEVI by Does not apply route.   ketoconazole (NIZORAL) 2 % cream Apply 1 Application topically 2 (two) times daily.   losartan (COZAAR) 50 MG tablet Take 50 mg by mouth daily.   magnesium chloride (SLOW-MAG) 64 MG TBEC SR tablet Take 1 tablet by mouth 2 (two) times daily.    metFORMIN (GLUCOPHAGE) 500 MG tablet Take 1 in the morning and 2 in the evening by mouth   metoprolol succinate (TOPROL-XL) 50 MG 24 hr tablet Take 50 mg by mouth daily. Take with or immediately following a meal.   Multiple Vitamins-Minerals (MULTIVITAMIN WITH MINERALS) tablet Take 1 tablet by mouth at bedtime.    Multiple Vitamins-Minerals (PRESERVISION AREDS 2) CAPS Take 1 capsule by mouth 2 (two) times daily.   simvastatin (ZOCOR) 40 MG tablet Take 40 mg by mouth at bedtime.    trimethoprim-polymyxin b (POLYTRIM) ophthalmic solution INSTILL ONE DROP INTO LEFT EYE 4 TIMES A DAY FOR 2 DAYS AFTER MONTHLY EYE INJECTION   venlafaxine XR (EFFEXOR-XR) 150 MG 24 hr capsule Take by mouth.   [DISCONTINUED] COVID-19 mRNA Vac-TriS, Pfizer, (PFIZER-BIONT COVID-19 VAC-TRIS) SUSP injection Inject into the muscle.   [DISCONTINUED] sertraline (ZOLOFT) 100 MG tablet Take 100 mg by mouth daily.    No  facility-administered encounter medications on file as of 09/24/2021.    Follow-up: Return in about 3 months (around 12/25/2021) for 3 months DM,HTN-me.     AWV-medicare w/Tina after Sept 6.   Wellington Hampshire, MD

## 2021-09-24 NOTE — Telephone Encounter (Signed)
See other note

## 2021-10-09 ENCOUNTER — Encounter (INDEPENDENT_AMBULATORY_CARE_PROVIDER_SITE_OTHER): Payer: Medicare Other | Admitting: Ophthalmology

## 2021-10-09 DIAGNOSIS — H43813 Vitreous degeneration, bilateral: Secondary | ICD-10-CM

## 2021-10-09 DIAGNOSIS — H353231 Exudative age-related macular degeneration, bilateral, with active choroidal neovascularization: Secondary | ICD-10-CM

## 2021-10-09 DIAGNOSIS — H35033 Hypertensive retinopathy, bilateral: Secondary | ICD-10-CM | POA: Diagnosis not present

## 2021-10-09 DIAGNOSIS — I10 Essential (primary) hypertension: Secondary | ICD-10-CM | POA: Diagnosis not present

## 2021-10-28 ENCOUNTER — Encounter: Payer: Self-pay | Admitting: Family Medicine

## 2021-10-31 DIAGNOSIS — Z23 Encounter for immunization: Secondary | ICD-10-CM | POA: Diagnosis not present

## 2021-11-13 ENCOUNTER — Encounter (INDEPENDENT_AMBULATORY_CARE_PROVIDER_SITE_OTHER): Payer: Medicare Other | Admitting: Ophthalmology

## 2021-11-13 DIAGNOSIS — H43813 Vitreous degeneration, bilateral: Secondary | ICD-10-CM | POA: Diagnosis not present

## 2021-11-13 DIAGNOSIS — H353231 Exudative age-related macular degeneration, bilateral, with active choroidal neovascularization: Secondary | ICD-10-CM | POA: Diagnosis not present

## 2021-11-13 DIAGNOSIS — H35033 Hypertensive retinopathy, bilateral: Secondary | ICD-10-CM | POA: Diagnosis not present

## 2021-11-13 DIAGNOSIS — I10 Essential (primary) hypertension: Secondary | ICD-10-CM | POA: Diagnosis not present

## 2021-12-10 ENCOUNTER — Telehealth: Payer: Self-pay | Admitting: Family Medicine

## 2021-12-10 ENCOUNTER — Other Ambulatory Visit: Payer: Self-pay | Admitting: *Deleted

## 2021-12-10 DIAGNOSIS — E119 Type 2 diabetes mellitus without complications: Secondary | ICD-10-CM

## 2021-12-10 MED ORDER — FREESTYLE LIBRE SENSOR SYSTEM MISC: 2 refills | Status: DC

## 2021-12-10 MED ORDER — CEPHALEXIN 250 MG PO CAPS: 250.0000 mg | ORAL_CAPSULE | Freq: Every day | ORAL | 1 refills | Status: DC

## 2021-12-10 MED ORDER — METFORMIN HCL 500 MG PO TABS: ORAL_TABLET | ORAL | 1 refills | Status: DC

## 2021-12-10 NOTE — Telephone Encounter (Signed)
  LAST APPOINTMENT DATE:  09/24/21 NEXT APPOINTMENT DATE: 12/25/21  MEDICATION: Continuous Blood Gluc Receiver (FREESTYLE LIBRE 14 DAY READER) DEVI--Patient states she will need the sensor metFORMIN (GLUCOPHAGE) 500 MG tablet  cephALEXin (KEFLEX) 250 MG capsule- States she has been taking this daily for a year due to having recurrent UTIs  Is the patient out of medication? Yes  [facility] [place] [place]

## 2021-12-11 ENCOUNTER — Encounter (INDEPENDENT_AMBULATORY_CARE_PROVIDER_SITE_OTHER): Payer: Medicare Other | Admitting: Ophthalmology

## 2021-12-11 ENCOUNTER — Telehealth: Payer: Self-pay | Admitting: Family Medicine

## 2021-12-11 ENCOUNTER — Other Ambulatory Visit: Payer: Self-pay | Admitting: Family Medicine

## 2021-12-11 DIAGNOSIS — I10 Essential (primary) hypertension: Secondary | ICD-10-CM

## 2021-12-11 DIAGNOSIS — H353231 Exudative age-related macular degeneration, bilateral, with active choroidal neovascularization: Secondary | ICD-10-CM | POA: Diagnosis not present

## 2021-12-11 DIAGNOSIS — H43813 Vitreous degeneration, bilateral: Secondary | ICD-10-CM | POA: Diagnosis not present

## 2021-12-11 DIAGNOSIS — E119 Type 2 diabetes mellitus without complications: Secondary | ICD-10-CM

## 2021-12-11 DIAGNOSIS — H35033 Hypertensive retinopathy, bilateral: Secondary | ICD-10-CM | POA: Diagnosis not present

## 2021-12-11 MED ORDER — FREESTYLE LIBRE 2 SENSOR MISC: 1 refills | Status: DC

## 2021-12-11 NOTE — Telephone Encounter (Signed)
Patient states Pharmacy told Patient RX for [employer] must be for [employer] or [employer].  Patient states she is out of Sensors.  Pharmacy  [id number] [place] [place] Phone:  [phone]  Fax:  [phone]

## 2021-12-11 NOTE — Telephone Encounter (Signed)
Rx changed to [place] 2.

## 2021-12-12 DIAGNOSIS — Z23 Encounter for immunization: Secondary | ICD-10-CM | POA: Diagnosis not present

## 2021-12-25 ENCOUNTER — Ambulatory Visit (INDEPENDENT_AMBULATORY_CARE_PROVIDER_SITE_OTHER): Payer: Medicare Other | Admitting: Family Medicine

## 2021-12-25 ENCOUNTER — Encounter: Payer: Self-pay | Admitting: Family Medicine

## 2021-12-25 VITALS — BP 120/70 | HR 111 | Temp 97.9°F | Ht 65.0 in | Wt 200.2 lb

## 2021-12-25 DIAGNOSIS — E1169 Type 2 diabetes mellitus with other specified complication: Secondary | ICD-10-CM

## 2021-12-25 DIAGNOSIS — E785 Hyperlipidemia, unspecified: Secondary | ICD-10-CM | POA: Diagnosis not present

## 2021-12-25 DIAGNOSIS — I10 Essential (primary) hypertension: Secondary | ICD-10-CM

## 2021-12-25 DIAGNOSIS — E119 Type 2 diabetes mellitus without complications: Secondary | ICD-10-CM

## 2021-12-25 DIAGNOSIS — F411 Generalized anxiety disorder: Secondary | ICD-10-CM | POA: Diagnosis not present

## 2021-12-25 MED ORDER — PRAZOSIN HCL 1 MG PO CAPS: 1.0000 mg | ORAL_CAPSULE | Freq: Every day | ORAL | 1 refills | Status: DC
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
## 2021-12-25 NOTE — Patient Instructions (Addendum)
It was very nice to see you today!  Happy Holidays!  Start prazosin at bedtime.  Let me know how doing.   Get blood pressure cuff and check at least weekly.   Referral to psych will be placed.   PLEASE NOTE:  If you had any lab tests please let us know if you have not heard back within a few days. You may see your results on MyChart before we have a chance to review them but we will give you a call once they are reviewed by [place]. If we ordered any referrals today, please let us know if you have not heard from their office within the next week.   Please try these tips to maintain a healthy lifestyle:  Eat most of your calories during the day when you are active. Eliminate processed foods including packaged sweets (pies, cakes, cookies), reduce intake of potatoes, white bread, white pasta, and white rice. Look for whole grain options, oat flour or almond flour.  Each meal should contain half fruits/vegetables, one quarter protein, and one quarter carbs (no bigger than a computer mouse).  Cut down on sweet beverages. This includes juice, soda, and sweet tea. Also watch fruit intake, though this is a healthier sweet option, it still contains natural sugar! Limit to 3 servings daily.  Drink at least 1 glass of water with each meal and aim for at least 8 glasses per day  Exercise at least 150 minutes every week.

## 2021-12-28 ENCOUNTER — Encounter: Payer: Self-pay | Admitting: Family Medicine

## 2022-01-01 ENCOUNTER — Encounter: Payer: Self-pay | Admitting: Family Medicine

## 2022-01-01 ENCOUNTER — Ambulatory Visit (INDEPENDENT_AMBULATORY_CARE_PROVIDER_SITE_OTHER): Payer: Medicare Other | Admitting: Family Medicine

## 2022-01-01 VITALS — BP 120/60 | HR 77 | Temp 97.5°F | Ht 65.0 in | Wt 205.1 lb

## 2022-01-01 DIAGNOSIS — L720 Epidermal cyst: Secondary | ICD-10-CM | POA: Diagnosis not present

## 2022-01-01 DIAGNOSIS — E785 Hyperlipidemia, unspecified: Secondary | ICD-10-CM

## 2022-01-01 DIAGNOSIS — I10 Essential (primary) hypertension: Secondary | ICD-10-CM

## 2022-01-01 DIAGNOSIS — E119 Type 2 diabetes mellitus without complications: Secondary | ICD-10-CM

## 2022-01-01 DIAGNOSIS — E1169 Type 2 diabetes mellitus with other specified complication: Secondary | ICD-10-CM

## 2022-01-01 LAB — COMPREHENSIVE METABOLIC PANEL
ALT: 32 U/L (ref 0–35)
AST: 24 U/L (ref 0–37)
Albumin: 4.1 g/dL (ref 3.5–5.2)
Alkaline Phosphatase: 77 U/L (ref 39–117)
BUN: 24 mg/dL — ABNORMAL HIGH (ref 6–23)
CO2: 28 mEq/L (ref 19–32)
Calcium: 10.1 mg/dL (ref 8.4–10.5)
Chloride: 108 mEq/L (ref 96–112)
Creatinine, Ser: 0.89 mg/dL (ref 0.40–1.20)
GFR: 63.51 mL/min (ref >60.00)
Glucose, Bld: 169 mg/dL — ABNORMAL HIGH (ref 70–99)
Potassium: 5.5 mEq/L — ABNORMAL HIGH (ref 3.5–5.1)
Sodium: 145 mEq/L (ref 135–145)
Total Bilirubin: 0.4 mg/dL (ref 0.2–1.2)
Total Protein: 6.7 g/dL (ref 6.0–8.3)

## 2022-01-01 LAB — MICROALBUMIN / CREATININE URINE RATIO
Creatinine,U: 177.6 mg/dL
Microalb Creat Ratio: 3.5 mg/g (ref 0.0–30.0)
Microalb, Ur: 6.2 mg/dL — ABNORMAL HIGH (ref 0.0–1.9)

## 2022-01-01 LAB — LIPID PANEL
Cholesterol: 158 mg/dL (ref 0–200)
HDL: 67 mg/dL (ref >39.00)
LDL Cholesterol: 71 mg/dL (ref 0–99)
NonHDL: 90.72
Total CHOL/HDL Ratio: 2
Triglycerides: 98 mg/dL (ref 0.0–149.0)
VLDL: 19.6 mg/dL (ref 0.0–40.0)

## 2022-01-01 LAB — VITAMIN B12: Vitamin B-12: 357 pg/mL (ref 211–911)

## 2022-01-01 LAB — HEMOGLOBIN A1C: Hgb A1c MFr Bld: 7 % — ABNORMAL HIGH (ref 4.6–6.5)

## 2022-01-01 LAB — TSH: TSH: 2.72 u[IU]/mL (ref 0.35–5.50)

## 2022-01-01 MED ORDER — TRETINOIN 0.025 % EX CREA: TOPICAL_CREAM | Freq: Every day | CUTANEOUS | 1 refills | Status: DC

## 2022-01-01 NOTE — Patient Instructions (Signed)
It was very nice to see you today!  Sent retin A  Happy holidays    PLEASE NOTE:  If you had any lab tests please let us know if you have not heard back within a few days. You may see your results on MyChart before we have a chance to review them but we will give you a call once they are reviewed by [place]. If we ordered any referrals today, please let us know if you have not heard from their office within the next week.   Please try these tips to maintain a healthy lifestyle:  Eat most of your calories during the day when you are active. Eliminate processed foods including packaged sweets (pies, cakes, cookies), reduce intake of potatoes, white bread, white pasta, and white rice. Look for whole grain options, oat flour or almond flour.  Each meal should contain half fruits/vegetables, one quarter protein, and one quarter carbs (no bigger than a computer mouse).  Cut down on sweet beverages. This includes juice, soda, and sweet tea. Also watch fruit intake, though this is a healthier sweet option, it still contains natural sugar! Limit to 3 servings daily.  Drink at least 1 glass of water with each meal and aim for at least 8 glasses per day  Exercise at least 150 minutes every week.

## 2022-01-01 NOTE — Progress Notes (Signed)
Subjective:     Patient ID: [name], female    DOB: April 19, 1945, 76 y.o.   MRN: [id number]  Chief Complaint  Patient presents with   Follow-up    1 week follow-up on tachycardia    Rash    Rash on lower lip that she has had for a while, but it is spreading, has seen derm before    HPI  HTN/tachycardia f/u-not checking at home.  Rash chin-has seen derm in past-etiol unclear.  "Spreading" new spots coming up.  Health Maintenance Due  Topic Date Due   Hepatitis C Screening  Never done   HEMOGLOBIN A1C  11/27/2021   OPHTHALMOLOGY EXAM  12/26/2021    Past Medical History:  Diagnosis Date   Anxiety    Arthritis    Blood transfusion without reported diagnosis    Cataract    Cough 01/11/2014   Depression    Diabetes type 2, controlled ([place])    Esophageal reflux disease    Hypercholesterolemia    Hypertension    Ischemic colitis ([place]) 01/28/2007   Liver enzyme elevation    Lung nodule 06/11/2012   Macular degeneration, wet ([place])    Multiple myeloma ([place]) 05/27/2012   Cytogenetics on 06/08/2012 was normal 58, XX [20]   Osteoporosis    Other fatigue 02/08/2014   Oxygen deficiency    Pancytopenia ([place])    Pneumonia    Post-nasal drip 01/11/2014   Sepsis ([place])    Sleep apnea    UPPER AIRWAY RESISTANCE  DOES NOT HAVE CPAP , MAY NEED    Tuberculosis     (+ TB TEST)YEARS AGO POSSIBLE EXPOSURE WAS MED TX    Type 2 diabetes mellitus without complications ([place]) 29/47/6546    Past Surgical History:  Procedure Laterality Date   APPENDECTOMY  1998   COLONOSCOPY  01/28/2011   poly; Dr. Oletta Lamas, next one due 2018.l   LEG SURGERY     Stem cell  2015   TONSILLECTOMY     VIDEO BRONCHOSCOPY WITH ENDOBRONCHIAL NAVIGATION Right 07/08/2012   Procedure: [place];  Surgeon: Collene Gobble, MD;  Location: [facility];  Service: Thoracic;  Laterality: Right;    Outpatient Medications Prior to Visit  Medication Sig Dispense Refill    azelastine (OPTIVAR) 0.05 % ophthalmic solution Place 1 drop into both eyes 2 (two) times daily.     buPROPion (WELLBUTRIN XL) 300 MG 24 hr tablet Take 300 mg by mouth daily.     cephALEXin (KEFLEX) 250 MG capsule Take 1 capsule (250 mg total) by mouth daily. 90 capsule 1   cholecalciferol (VITAMIN D) 1000 UNITS tablet Take 2,000 Units by mouth daily.     Continuous Blood Gluc Receiver (FREESTYLE LIBRE 2 READER) DEVI Use to test blood sugar 1 each 1   Continuous Blood Gluc Sensor (FREESTYLE LIBRE 2 SENSOR) MISC Use to test blood sugar 1 each 1   ketoconazole (NIZORAL) 2 % cream Apply 1 Application topically 2 (two) times daily. 60 g 1   losartan (COZAAR) 50 MG tablet Take 50 mg by mouth daily.     magnesium chloride (SLOW-MAG) 64 MG TBEC SR tablet Take 1 tablet by mouth 2 (two) times daily.      metFORMIN (GLUCOPHAGE) 500 MG tablet Take 1 in the morning and 2 in the evening by mouth 270 tablet 1   metoprolol succinate (TOPROL-XL) 50 MG 24 hr tablet Take 50 mg by mouth daily. Take with or immediately following a  meal.     Multiple Vitamins-Minerals (MULTIVITAMIN WITH MINERALS) tablet Take 1 tablet by mouth at bedtime.      Multiple Vitamins-Minerals (PRESERVISION AREDS 2) CAPS Take 1 capsule by mouth 2 (two) times daily.     prazosin (MINIPRESS) 1 MG capsule Take 1 capsule (1 mg total) by mouth at bedtime. 30 capsule 1   simvastatin (ZOCOR) 40 MG tablet Take 40 mg by mouth at bedtime.      trimethoprim-polymyxin b (POLYTRIM) ophthalmic solution Place into both eyes.     venlafaxine XR (EFFEXOR-XR) 150 MG 24 hr capsule Take by mouth.     No facility-administered medications prior to visit.    No Known Allergies ROS neg/noncontributory except as noted HPI/below      Objective:     BP 120/60   Pulse 77   Temp (!) 97.5 F (36.4 C) (Temporal)   Ht _0  (1.651 m)   Wt 205 lb 2 oz (93 kg)   SpO2 97%   BMI 34.13 kg/m  Wt Readings from Last 3 Encounters:  01/01/22 205 lb 2 oz (93 kg)   12/25/21 200 lb 4 oz (90.8 kg)  09/24/21 203 lb 4 oz (92.2 kg)    Physical Exam   Gen: WDWN NAD HEENT: NCAT, conjunctiva not injected, sclera nonicteric CARDIAC: RRR, S1S2+, no murmur.  MSK: no gross abnormalities.  NEURO: A&O x3.  CN II-XII intact.  PSYCH: normal mood. Good eye contact Face-scattered hyperkeratotic papules around lower lip, chin, R cheek, L nose.   W/pt permission-alcohol prep and opened newest once on chin and expressed core.  Pt tol well.     Assessment & Plan:   Problem List Items Addressed This Visit       Cardiovascular and Mediastinum   Essential hypertension - Primary   Other Visit Diagnoses     Milia       Relevant Medications   tretinoin (RETIN-A) 0.025 % cream      HTN controlled and HR normal now. As concerned w/tachycardia last wk.  That was prob d/t anxiety.   Rash face-suspect milia.  Retin A 0.025% at HS.  Pt will call for derm.    Meds ordered this encounter  Medications   tretinoin (RETIN-A) 0.025 % cream    Sig: Apply topically at bedtime. milia    Dispense:  45 g    Refill:  1    Wellington Hampshire, MD

## 2022-01-02 NOTE — Progress Notes (Signed)
Labs ok except Sugars at goal for age, but need to continue working on it Potassium is elevated(which may be a lab problem) but need to repeat soon.

## 2022-01-03 ENCOUNTER — Other Ambulatory Visit: Payer: Self-pay | Admitting: *Deleted

## 2022-01-03 ENCOUNTER — Telehealth: Payer: Self-pay | Admitting: Family Medicine

## 2022-01-03 DIAGNOSIS — E875 Hyperkalemia: Secondary | ICD-10-CM

## 2022-01-03 DIAGNOSIS — E119 Type 2 diabetes mellitus without complications: Secondary | ICD-10-CM

## 2022-01-03 MED ORDER — FREESTYLE LIBRE 2 SENSOR MISC: 1 refills | Status: DC

## 2022-01-03 NOTE — Telephone Encounter (Signed)
   LAST APPOINTMENT DATE:  01/01/22  NEXT APPOINTMENT DATE: 01/06/22 (Lab)  MEDICATION: Continuous Blood Gluc Sensor (FREESTYLE LIBRE 2 SENSOR) MISC   Is the patient out of medication? Unknown  [id number] [address], [place], [place] [address] Phone: [phone]  Fax: [phone]     Patient states (In [place] message): - RX for [employer] sensor refills one sensor per two weeks.   - Can Dr. Cherlynn Kaiser please write an RX to [facility] for six sensors   or more so that I will have a larger supply?

## 2022-01-03 NOTE — Telephone Encounter (Signed)
Rx sent to the pharmacy.

## 2022-01-06 ENCOUNTER — Other Ambulatory Visit: Payer: Medicare Other

## 2022-01-08 ENCOUNTER — Encounter (INDEPENDENT_AMBULATORY_CARE_PROVIDER_SITE_OTHER): Payer: Medicare Other | Admitting: Ophthalmology

## 2022-01-09 ENCOUNTER — Encounter (INDEPENDENT_AMBULATORY_CARE_PROVIDER_SITE_OTHER): Payer: Medicare Other | Admitting: Ophthalmology

## 2022-01-09 DIAGNOSIS — H353231 Exudative age-related macular degeneration, bilateral, with active choroidal neovascularization: Secondary | ICD-10-CM

## 2022-01-09 DIAGNOSIS — H43813 Vitreous degeneration, bilateral: Secondary | ICD-10-CM

## 2022-01-09 DIAGNOSIS — H35033 Hypertensive retinopathy, bilateral: Secondary | ICD-10-CM | POA: Diagnosis not present

## 2022-01-09 DIAGNOSIS — I10 Essential (primary) hypertension: Secondary | ICD-10-CM | POA: Diagnosis not present

## 2022-01-15 ENCOUNTER — Ambulatory Visit (INDEPENDENT_AMBULATORY_CARE_PROVIDER_SITE_OTHER): Payer: Medicare Other | Admitting: Family Medicine

## 2022-01-15 ENCOUNTER — Encounter (INDEPENDENT_AMBULATORY_CARE_PROVIDER_SITE_OTHER): Payer: Self-pay

## 2022-01-15 ENCOUNTER — Ambulatory Visit (INDEPENDENT_AMBULATORY_CARE_PROVIDER_SITE_OTHER): Payer: Medicare Other

## 2022-01-15 VITALS — BP 145/82 | HR 64 | Temp 97.0°F | Resp 18 | Wt 200.0 lb

## 2022-01-15 DIAGNOSIS — S62304A Unspecified fracture of fourth metacarpal bone, right hand, initial encounter for closed fracture: Secondary | ICD-10-CM | POA: Diagnosis not present

## 2022-01-15 DIAGNOSIS — S6791XA Crushing injury of unspecified part(s) of right wrist, hand and fingers, initial encounter: Secondary | ICD-10-CM

## 2022-01-15 DIAGNOSIS — S6291XA Unspecified fracture of right wrist and hand, initial encounter for closed fracture: Secondary | ICD-10-CM

## 2022-01-15 NOTE — Progress Notes (Signed)
[facility]  PROGRESS NOTE     Patient: Kristin Bullock   Date: 01/15/2022   MRN: [id number]       Kristin Bullock is a 76 y.o. female      HISTORY     History obtained from: Patient    Chief Complaint   Patient presents with    Fall     Pt fell of a 6 inch drop and hurt her right hand, pt is having pain and swelling to her hand.           Hand Injury   The incident occurred today. The injury mechanism was a fall. Context: at home missed a step. There is an injury to the Right hand.        Review of Systems   All other systems reviewed and are negative.      History:    Pertinent Past Medical, Surgical, Family and Social History were reviewed.        Current Outpatient Medications:     buPROPion XL (WELLBUTRIN XL) 300 MG 24 hr tablet, Take 1 tablet (300 mg) by mouth daily, Disp: , Rfl:     losartan (COZAAR) 25 MG tablet, , Disp: , Rfl:     metFORMIN (GLUCOPHAGE) 500 MG tablet, Take 1 in the morning and 2 in the evening by mouth, Disp: , Rfl:     sertraline (ZOLOFT) 100 MG tablet, Take 1 tablet (100 mg) by mouth daily, Disp: , Rfl:     simvastatin (ZOCOR) 40 MG tablet, Take 1 tablet (40 mg) by mouth, Disp: , Rfl:     venlafaxine (EFFEXOR-XR) 150 MG 24 hr capsule, Take by mouth, Disp: , Rfl:     No Known Allergies    Medications and Allergies reviewed.    PHYSICAL EXAM     Vitals:    01/15/22 1702   BP: 145/82   Pulse: 64   Resp: 18   Temp: 97 F (36.1 C)   SpO2: 96%   Weight: 90.7 kg (200 lb)       Physical Exam  Constitutional:       Appearance: Normal appearance.   Musculoskeletal:      Right hand: Swelling, tenderness and bony tenderness present. Decreased range of motion.        Hands:    Neurological:      Mental Status: She is alert.   Vitals reviewed.         UCC COURSE     There were no labs reviewed with this patient during the visit.    X-Ray  The following X-ray studies were ordered, visualized and independently interpreted by me. Results were discussed with the patient/family.     XR Hand Right PA  Lateral And Oblique    Result Date: 01/15/2022  HISTORY: Right hand injury. COMPARISON: None. FINDINGS: There is an acute mildly displaced oblique fracture of the fourth distal metacarpal shaft/neck. No other acute fracture identified. The bones appear osteopenic. Mild degenerative changes with small osteophytes along multiple IP joints and first CMC joint.     1.Acute fracture of the fourth metacarpal. Emeline Darling, MD 01/15/2022 5:54 PM      No current facility-administered medications for this visit.     X-ray ordered and images reviewed.   Preliminary reading: 3 view XR of right hand reveals fracture 4th metacarpal  Results of x-ray discussed with the patient/family.   Radiologist reading also reviewed and copy of report provided to patient.  PROCEDURES     Procedures    MEDICAL DECISION MAKING     History, physical, labs/studies most consistent with hand injury as the diagnosis.    Chart Review:  Prior PCP, Specialist and/or ED notes reviewed today: No  Prior labs/images/studies reviewed today: No    Differential Diagnosis:       ASSESSMENT     Encounter Diagnoses   Name Primary?    Crushing injury of unspecified part(s) of right wrist, hand and fingers, initial encounter     Hand fracture, right, closed, initial encounter Yes            PLAN      PLAN: -Patient to follow up with primary care doctor or follow up here if symptoms worsening or not resolving as expected.   -Follow up with ED with acute worsening of symptoms.  -Patient verbalized understanding.    -Reviewed discharge instructions that are included here with patient, and printed in AVS.   -Questions answered.  -Risks and benefits of therapy discussed.  Pt agrees and understands.        Orders Placed This Encounter   Procedures    Task for Quick Fit Wrist II Universal (adult)    XR Hand Right PA Lateral And Oblique    Referral to [facility]     Requested Prescriptions      No prescriptions requested or ordered in this encounter        Discussed results and diagnosis with patient/family.  Reviewed warning signs for worsening condition, as well as, indications for follow-up with primary care physician and return to urgent care clinic.   Patient/family expressed understanding of instructions.     An After Visit Summary was provided to the patient.

## 2022-01-17 ENCOUNTER — Encounter (INDEPENDENT_AMBULATORY_CARE_PROVIDER_SITE_OTHER): Payer: Self-pay

## 2022-01-17 ENCOUNTER — Ambulatory Visit (INDEPENDENT_AMBULATORY_CARE_PROVIDER_SITE_OTHER): Payer: Medicare Other | Admitting: Family Nurse Practitioner

## 2022-01-17 VITALS — BP 148/83 | HR 96 | Temp 99.8°F | Resp 20 | Ht 65.0 in | Wt 208.0 lb

## 2022-01-17 DIAGNOSIS — J189 Pneumonia, unspecified organism: Secondary | ICD-10-CM | POA: Diagnosis not present

## 2022-01-17 MED ORDER — AZITHROMYCIN 250 MG PO TABS
ORAL_TABLET | ORAL | 0 refills | Status: AC
Start: 2022-01-17 — End: 2022-01-22

## 2022-01-17 MED ORDER — BENZONATATE 100 MG PO CAPS
100.0000 mg | ORAL_CAPSULE | Freq: Three times a day (TID) | ORAL | 0 refills | Status: AC | PRN
Start: 2022-01-17 — End: 2022-01-24

## 2022-01-17 MED ORDER — ALBUTEROL SULFATE HFA 108 (90 BASE) MCG/ACT IN AERS
2.0000 | INHALATION_SPRAY | RESPIRATORY_TRACT | 0 refills | Status: AC | PRN
Start: 2022-01-17 — End: 2022-03-18

## 2022-01-17 NOTE — Progress Notes (Signed)
[facility]  PROGRESS NOTE     Patient: Kristin Bullock   Date: 01/17/2022   MRN: [id number]       JILLIANA BURKES is a 76 y.o. female      HISTORY     History obtained from: Patient    Chief Complaint   Patient presents with    Cough     Pt c/o sore throat, phlegm, cough and head started 8 days ago  Sometimes difficulty breathing   Home COVID tested negative        PPW fever, cough, HA, sore throat, SOB, and excessive phlegm x 8 days. States lobectomy. States she feels s/sx should have gotten better by now. States no OTC meds.       Cough         Review of Systems   Respiratory:  Positive for cough.        History:    Pertinent Past Medical, Surgical, Family and Social History were reviewed.        Current Outpatient Medications:     buPROPion XL (WELLBUTRIN XL) 300 MG 24 hr tablet, Take 1 tablet (300 mg) by mouth daily, Disp: , Rfl:     losartan (COZAAR) 25 MG tablet, , Disp: , Rfl:     metFORMIN (GLUCOPHAGE) 500 MG tablet, Take 1 in the morning and 2 in the evening by mouth, Disp: , Rfl:     sertraline (ZOLOFT) 100 MG tablet, Take 1 tablet (100 mg) by mouth daily, Disp: , Rfl:     simvastatin (ZOCOR) 40 MG tablet, Take 1 tablet (40 mg) by mouth, Disp: , Rfl:     venlafaxine (EFFEXOR-XR) 150 MG 24 hr capsule, Take by mouth, Disp: , Rfl:     albuterol sulfate HFA (Proventil HFA) 108 (90 Base) MCG/ACT inhaler, Inhale 2 puffs into the lungs every 4 (four) hours as needed for Wheezing or Shortness of Breath, Disp: 1 each, Rfl: 0    azithromycin (ZITHROMAX) 250 MG tablet, Take 2 tabs daily on day 1, then 1 tablet daily for 4 days., Disp: 6 tablet, Rfl: 0    benzonatate (TESSALON) 100 MG capsule, Take 1 capsule (100 mg) by mouth 3 (three) times daily as needed for Cough, Disp: 21 capsule, Rfl: 0    No Known Allergies    Medications and Allergies reviewed.    PHYSICAL EXAM     Vitals:    01/17/22 0938   BP: 148/83   Pulse: 96   Resp: 20   Temp: 99.8 F (37.7 C)   TempSrc: Oral   SpO2: 95%   Weight: 94.3 kg (208 lb)    Height: 1.651 m (5\' 5" )       Physical Exam  Constitutional:       General: She is not in acute distress.     Appearance: Normal appearance. She is not ill-appearing, toxic-appearing or diaphoretic.   HENT:      Head: Normocephalic and atraumatic.     Eyes: Conjunctivae are normal. Cardiovascular:      Rate and Rhythm: Normal rate and regular rhythm.      Heart sounds: Normal heart sounds.   Pulmonary:      Effort: Pulmonary effort is normal.      Breath sounds: Normal breath sounds.   Neurological:      Mental Status: She is alert and oriented to person, place, and time.   Psychiatric:         Behavior: Behavior normal.  Vitals and nursing note reviewed.         UCC COURSE     There were no labs reviewed with this patient during the visit.    There were no x-rays reviewed with this patient during the visit.    No current facility-administered medications for this visit.       PROCEDURES     Procedures    MEDICAL DECISION MAKING     History, physical, labs/studies most consistent with atypical pneumonia as the diagnosis.    Chart Review:  Prior PCP, Specialist and/or ED notes reviewed today: No  Prior labs/images/studies reviewed today: No    Differential Diagnosis: covid, influenza, strept, rhinitis, unspecified viral infection.        ASSESSMENT     Encounter Diagnosis   Name Primary?    Atypical pneumonia Yes            PLAN      PLAN: Discussed beginning ABX due to lobectomy. Discussed beginning Tessalon and albuterol.       No orders of the defined types were placed in this encounter.    Requested Prescriptions     Signed Prescriptions Disp Refills    azithromycin (ZITHROMAX) 250 MG tablet 6 tablet 0     Sig: Take 2 tabs daily on day 1, then 1 tablet daily for 4 days.    benzonatate (TESSALON) 100 MG capsule 21 capsule 0     Sig: Take 1 capsule (100 mg) by mouth 3 (three) times daily as needed for Cough    albuterol sulfate HFA (Proventil HFA) 108 (90 Base) MCG/ACT inhaler 1 each 0     Sig: Inhale 2 puffs  into the lungs every 4 (four) hours as needed for Wheezing or Shortness of Breath       Discussed results and diagnosis with patient/family.  Reviewed warning signs for worsening condition, as well as, indications for follow-up with primary care physician and return to urgent care clinic.   Patient/family expressed understanding of instructions.     An After Visit Summary was provided to the patient.

## 2022-01-21 ENCOUNTER — Other Ambulatory Visit: Payer: Self-pay | Admitting: Family Medicine

## 2022-01-30 ENCOUNTER — Ambulatory Visit (INDEPENDENT_AMBULATORY_CARE_PROVIDER_SITE_OTHER): Payer: Medicare Other | Admitting: Family Medicine

## 2022-01-30 ENCOUNTER — Other Ambulatory Visit: Payer: Medicare Other

## 2022-01-30 ENCOUNTER — Ambulatory Visit: Payer: Medicare Other | Admitting: Family Medicine

## 2022-01-30 ENCOUNTER — Encounter: Payer: Self-pay | Admitting: Family Medicine

## 2022-01-30 VITALS — BP 124/70 | HR 96 | Temp 97.8°F | Ht 65.0 in | Wt 200.1 lb

## 2022-01-30 DIAGNOSIS — R051 Acute cough: Secondary | ICD-10-CM

## 2022-01-30 DIAGNOSIS — E875 Hyperkalemia: Secondary | ICD-10-CM | POA: Diagnosis not present

## 2022-01-30 DIAGNOSIS — S62304A Unspecified fracture of fourth metacarpal bone, right hand, initial encounter for closed fracture: Secondary | ICD-10-CM

## 2022-01-30 LAB — POTASSIUM: Potassium: 4.9 mEq/L (ref 3.5–5.1)

## 2022-01-30 NOTE — Progress Notes (Signed)
Subjective:     Patient ID: [name], female    DOB: 1945/10/23, 77 y.o.   MRN: [id number]  Chief Complaint  Patient presents with   Follow-up    Follow-up on blood work, potassium Fasting    Fall    Missed a step on the stairs and fell, broken bone in right hand on 01/14/22    HPI Went to UC in [place] on 01/15/22.  Fell on 12/19..  Was at son's house and missed 1 step and fell onto floor. No LOC. Fx R hand.  Bruised R knee. Placed in splint and told not to take it off.  F/u ortho.  No n/t.  Just swelling.  Some pain if moves a certain way.  Not taking any meds.  2.  On 12/22-went to UC for pneumonia-abx and inhaler-better, but now dry cough.  XR Hand Right PA Lateral And Oblique  Result Date: 01/15/2022 HISTORY: Right hand injury. COMPARISON: None. FINDINGS: There is an acute mildly displaced oblique fracture of the fourth distal metacarpal shaft/neck. No other acute fracture identified. The bones appear osteopenic. Mild degenerative changes with small osteophytes along multiple IP joints and first CMC joint.   1.Acute fracture of the fourth metacarpal. Alois Cliche, MD 01/15/2022 5:54 PM   Health Maintenance Due  Topic Date Due   Hepatitis C Screening  Never done   Pneumonia Vaccine 5+ Years old (2 - PPSV23 or PCV20) 04/17/2017   OPHTHALMOLOGY EXAM  12/26/2021    Past Medical History:  Diagnosis Date   Anxiety    Arthritis    Blood transfusion without reported diagnosis    Cataract    Cough 01/11/2014   Depression    Diabetes type 2, controlled ([place])    Esophageal reflux disease    Hypercholesterolemia    Hypertension    Ischemic colitis ([place]) 01/28/2007   Liver enzyme elevation    Lung nodule 06/11/2012   Macular degeneration, wet ([place])    Multiple myeloma ([place]) 05/27/2012   Cytogenetics on 06/08/2012 was normal 74, XX [20]   Osteoporosis    Other fatigue 02/08/2014   Oxygen deficiency    Pancytopenia ([place])    Pneumonia    Post-nasal drip 01/11/2014    Sepsis ([place])    Sleep apnea    UPPER AIRWAY RESISTANCE  DOES NOT HAVE CPAP , MAY NEED    Tuberculosis     (+ TB TEST)YEARS AGO POSSIBLE EXPOSURE WAS MED TX    Type 2 diabetes mellitus without complications ([place]) 34/28/7681    Past Surgical History:  Procedure Laterality Date   APPENDECTOMY  1998   COLONOSCOPY  01/28/2011   poly; Dr. Oletta Lamas, next one due 2018.l   LEG SURGERY     Stem cell  2015   TONSILLECTOMY     VIDEO BRONCHOSCOPY WITH ENDOBRONCHIAL NAVIGATION Right 07/08/2012   Procedure: [place];  Surgeon: Collene Gobble, MD;  Location: [facility];  Service: Thoracic;  Laterality: Right;    Outpatient Medications Prior to Visit  Medication Sig Dispense Refill   azelastine (OPTIVAR) 0.05 % ophthalmic solution Place 1 drop into both eyes 2 (two) times daily.     buPROPion (WELLBUTRIN XL) 300 MG 24 hr tablet Take 300 mg by mouth daily.     cephALEXin (KEFLEX) 250 MG capsule Take 1 capsule (250 mg total) by mouth daily. 90 capsule 1   cholecalciferol (VITAMIN D) 1000 UNITS tablet Take 2,000 Units by mouth daily.  Continuous Blood Gluc Receiver (FREESTYLE LIBRE 2 READER) DEVI Use to test blood sugar 1 each 1   Continuous Blood Gluc Sensor (FREESTYLE LIBRE 2 SENSOR) MISC Use to test blood sugar 1 each 1   ketoconazole (NIZORAL) 2 % cream Apply 1 Application topically 2 (two) times daily. 60 g 1   losartan (COZAAR) 50 MG tablet Take 50 mg by mouth daily.     magnesium chloride (SLOW-MAG) 64 MG TBEC SR tablet Take 1 tablet by mouth 2 (two) times daily.      metFORMIN (GLUCOPHAGE) 500 MG tablet Take 1 in the morning and 2 in the evening by mouth 270 tablet 1   metoprolol succinate (TOPROL-XL) 50 MG 24 hr tablet Take 50 mg by mouth daily. Take with or immediately following a meal.     Multiple Vitamins-Minerals (MULTIVITAMIN WITH MINERALS) tablet Take 1 tablet by mouth at bedtime.      Multiple Vitamins-Minerals (PRESERVISION AREDS 2) CAPS Take 1  capsule by mouth 2 (two) times daily.     prazosin (MINIPRESS) 1 MG capsule TAKE 1 CAPSULE BY MOUTH AT BEDTIME. 90 capsule 1   simvastatin (ZOCOR) 40 MG tablet Take 40 mg by mouth at bedtime.      tretinoin (RETIN-A) 0.025 % cream Apply topically at bedtime. milia 45 g 1   trimethoprim-polymyxin b (POLYTRIM) ophthalmic solution Place into both eyes.     venlafaxine XR (EFFEXOR-XR) 150 MG 24 hr capsule Take by mouth.     No facility-administered medications prior to visit.    No Known Allergies ROS neg/noncontributory except as noted HPI/below      Objective:     BP 124/70   Pulse 96   Temp 97.8 F (36.6 C) (Temporal)   Ht _0  (1.651 m)   Wt 200 lb 2 oz (90.8 kg)   SpO2 97%   BMI 33.30 kg/m  Wt Readings from Last 3 Encounters:  01/30/22 200 lb 2 oz (90.8 kg)  01/01/22 205 lb 2 oz (93 kg)  12/25/21 200 lb 4 oz (90.8 kg)    Physical Exam   Gen: WDWN NAD HEENT: NCAT, conjunctiva not injected, sclera nonicteric NECK:  supple, no thyromegaly, no nodes, no carotid bruits CARDIAC: RRR, S1S2+, no murmur.  LUNGS: CTAB. No wheezes EXT:  no edema MSK:R hand in splint.  Sens intact and warm.  Didn't remove splint.  NEURO: A&O x3.  CN II-XII intact.  PSYCH: normal mood. Good eye contact  Reviewed UC records     Assessment & Plan:   Problem List Items Addressed This Visit       Other   Cough   Other Visit Diagnoses     Closed nondisplaced fracture of fourth metacarpal bone of right hand, unspecified portion of metacarpal, initial encounter    -  Primary   Relevant Orders   Ambulatory referral to Orthopedics      Fx distal 4th metacarpan R hand-refer ortho  wearing splint.  Cough-was on abx.  Dry now.  Cough drops, etc.  If persists, new symptoms/worsens, let me know.  No orders of the defined types were placed in this encounter.   Wellington Hampshire, MD

## 2022-01-30 NOTE — Patient Instructions (Addendum)
Sending referral to ortho  If cough continues in 1 week or worsens, let me know

## 2022-02-04 ENCOUNTER — Ambulatory Visit: Payer: Medicare Other | Admitting: Orthopaedic Surgery

## 2022-02-06 ENCOUNTER — Encounter (INDEPENDENT_AMBULATORY_CARE_PROVIDER_SITE_OTHER): Payer: Medicare Other | Admitting: Ophthalmology

## 2022-02-07 ENCOUNTER — Encounter (INDEPENDENT_AMBULATORY_CARE_PROVIDER_SITE_OTHER): Payer: Medicare Other | Admitting: Ophthalmology

## 2022-02-07 DIAGNOSIS — H43813 Vitreous degeneration, bilateral: Secondary | ICD-10-CM | POA: Diagnosis not present

## 2022-02-07 DIAGNOSIS — I10 Essential (primary) hypertension: Secondary | ICD-10-CM

## 2022-02-07 DIAGNOSIS — H35033 Hypertensive retinopathy, bilateral: Secondary | ICD-10-CM

## 2022-02-07 DIAGNOSIS — H353231 Exudative age-related macular degeneration, bilateral, with active choroidal neovascularization: Secondary | ICD-10-CM | POA: Diagnosis not present

## 2022-02-12 ENCOUNTER — Other Ambulatory Visit: Payer: Self-pay | Admitting: Family Medicine

## 2022-02-18 ENCOUNTER — Ambulatory Visit: Payer: Medicare Other | Admitting: Orthopaedic Surgery

## 2022-02-19 ENCOUNTER — Ambulatory Visit (INDEPENDENT_AMBULATORY_CARE_PROVIDER_SITE_OTHER): Payer: Medicare Other

## 2022-02-19 ENCOUNTER — Encounter: Payer: Self-pay | Admitting: Orthopaedic Surgery

## 2022-02-19 ENCOUNTER — Ambulatory Visit (INDEPENDENT_AMBULATORY_CARE_PROVIDER_SITE_OTHER): Payer: Medicare Other | Admitting: Orthopaedic Surgery

## 2022-02-19 DIAGNOSIS — S62324D Displaced fracture of shaft of fourth metacarpal bone, right hand, subsequent encounter for fracture with routine healing: Secondary | ICD-10-CM | POA: Diagnosis not present

## 2022-02-19 DIAGNOSIS — M79641 Pain in right hand: Secondary | ICD-10-CM

## 2022-02-19 NOTE — Progress Notes (Signed)
The patient is a 77 year old female who is between 4 and 5 weeks out from a right dominant hand injury.  She injured this hand after a mechanical fall.  She was placed appropriately in a removable splint.  She comes today saying she is not really having much pain in that hand.  The injury occurred on 20 December.  Examination of the right hand shows no rotation deformity of all metacarpals and fingers.  There is some slight swelling over the fourth metacarpal dorsally.  There is only slight pain to stressing the area of the known fourth metacarpal shaft fracture.  She is neurovascular intact.  She can make a full composite fist.  Her abduction and adduction as well as flexion of all fingers is normal and congruent.  X-rays of the right hand including 3 views show a healing metacarpal shaft fracture with only slight shortening and no malalignment or angulation.  She can stop wearing her splint at this standpoint but she will be careful using her hand and let pain and comfort be her guide.  Will see her back in 4 weeks for final 3 views of her right hand.  All questions concerns were answered and addressed.

## 2022-03-11 ENCOUNTER — Encounter (INDEPENDENT_AMBULATORY_CARE_PROVIDER_SITE_OTHER): Payer: Medicare Other | Admitting: Ophthalmology

## 2022-03-11 DIAGNOSIS — I10 Essential (primary) hypertension: Secondary | ICD-10-CM

## 2022-03-11 DIAGNOSIS — H43813 Vitreous degeneration, bilateral: Secondary | ICD-10-CM

## 2022-03-11 DIAGNOSIS — H35033 Hypertensive retinopathy, bilateral: Secondary | ICD-10-CM | POA: Diagnosis not present

## 2022-03-11 DIAGNOSIS — H353231 Exudative age-related macular degeneration, bilateral, with active choroidal neovascularization: Secondary | ICD-10-CM | POA: Diagnosis not present

## 2022-03-19 ENCOUNTER — Encounter: Payer: Self-pay | Admitting: Orthopaedic Surgery

## 2022-03-19 ENCOUNTER — Ambulatory Visit (INDEPENDENT_AMBULATORY_CARE_PROVIDER_SITE_OTHER): Payer: Medicare Other

## 2022-03-19 ENCOUNTER — Ambulatory Visit (INDEPENDENT_AMBULATORY_CARE_PROVIDER_SITE_OTHER): Payer: Medicare Other | Admitting: Orthopaedic Surgery

## 2022-03-19 DIAGNOSIS — S62324D Displaced fracture of shaft of fourth metacarpal bone, right hand, subsequent encounter for fracture with routine healing: Secondary | ICD-10-CM | POA: Diagnosis not present

## 2022-03-19 NOTE — Progress Notes (Signed)
The patient is a 77 year old female who is 9 weeks into a metacarpal fracture of her right hand.  She fractured her fourth metacarpal of the shaft.  She says she has some aching with activity but overall she is doing better.  On exam all of her fingers have normal range of motion and normal rotation.  She can make a composite fist on the right hand easily.  She just has some mild pain at the fracture site.  3 views of the right hand show the fracture is almost healed completely.  I gave her reassurance that the pain she is experiencing is normal.  She should go on to heal this based on the worsening on clinical exam and x-ray findings.  She should still avoid high impact activities with the hand but she can continue to use her hand as comfort allows.  We really do not need x-rays again unless she is having any issues.  All questions and concerns were addressed and answered.  Follow-up is as needed.

## 2022-03-24 ENCOUNTER — Emergency Department (HOSPITAL_BASED_OUTPATIENT_CLINIC_OR_DEPARTMENT_OTHER)
Admission: EM | Admit: 2022-03-24 | Discharge: 2022-03-24 | Disposition: A | Discharge status: Home / Self Care | Payer: Medicare Other | Attending: Emergency Medicine | Admitting: Emergency Medicine

## 2022-03-24 ENCOUNTER — Emergency Department (HOSPITAL_BASED_OUTPATIENT_CLINIC_OR_DEPARTMENT_OTHER): Payer: Medicare Other | Admitting: Radiology

## 2022-03-24 ENCOUNTER — Other Ambulatory Visit: Payer: Self-pay

## 2022-03-24 DIAGNOSIS — Y92410 Unspecified street and highway as the place of occurrence of the external cause: Secondary | ICD-10-CM | POA: Diagnosis not present

## 2022-03-24 DIAGNOSIS — R079 Chest pain, unspecified: Secondary | ICD-10-CM | POA: Diagnosis not present

## 2022-03-24 DIAGNOSIS — M79642 Pain in left hand: Secondary | ICD-10-CM | POA: Diagnosis not present

## 2022-03-24 DIAGNOSIS — S62615A Displaced fracture of proximal phalanx of left ring finger, initial encounter for closed fracture: Secondary | ICD-10-CM | POA: Insufficient documentation

## 2022-03-24 DIAGNOSIS — S62327A Displaced fracture of shaft of fifth metacarpal bone, left hand, initial encounter for closed fracture: Secondary | ICD-10-CM | POA: Insufficient documentation

## 2022-03-24 DIAGNOSIS — S62307A Unspecified fracture of fifth metacarpal bone, left hand, initial encounter for closed fracture: Secondary | ICD-10-CM

## 2022-03-24 DIAGNOSIS — S6990XA Unspecified injury of unspecified wrist, hand and finger(s), initial encounter: Secondary | ICD-10-CM | POA: Diagnosis not present

## 2022-03-24 DIAGNOSIS — M25572 Pain in left ankle and joints of left foot: Secondary | ICD-10-CM | POA: Diagnosis not present

## 2022-03-24 DIAGNOSIS — S20219A Contusion of unspecified front wall of thorax, initial encounter: Secondary | ICD-10-CM | POA: Diagnosis not present

## 2022-03-24 DIAGNOSIS — S20212A Contusion of left front wall of thorax, initial encounter: Secondary | ICD-10-CM | POA: Diagnosis not present

## 2022-03-24 DIAGNOSIS — M79662 Pain in left lower leg: Secondary | ICD-10-CM | POA: Diagnosis not present

## 2022-03-24 DIAGNOSIS — S62326A Displaced fracture of shaft of fifth metacarpal bone, right hand, initial encounter for closed fracture: Secondary | ICD-10-CM | POA: Diagnosis not present

## 2022-03-24 DIAGNOSIS — S6992XA Unspecified injury of left wrist, hand and finger(s), initial encounter: Secondary | ICD-10-CM | POA: Diagnosis present
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
## 2022-03-24 NOTE — ED Notes (Signed)
To x-ray
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
## 2022-03-26 DIAGNOSIS — S62307A Unspecified fracture of fifth metacarpal bone, left hand, initial encounter for closed fracture: Secondary | ICD-10-CM | POA: Diagnosis not present

## 2022-03-26 DIAGNOSIS — S62615A Displaced fracture of proximal phalanx of left ring finger, initial encounter for closed fracture: Secondary | ICD-10-CM | POA: Diagnosis not present

## 2022-03-26 DIAGNOSIS — S62619A Displaced fracture of proximal phalanx of unspecified finger, initial encounter for closed fracture: Secondary | ICD-10-CM | POA: Insufficient documentation

## 2022-03-26 HISTORY — DX: Displaced fracture of proximal phalanx of unspecified finger, initial encounter for closed fracture: S62.619A

## 2022-03-31 ENCOUNTER — Ambulatory Visit (INDEPENDENT_AMBULATORY_CARE_PROVIDER_SITE_OTHER): Payer: Medicare Other | Admitting: Family Medicine

## 2022-03-31 ENCOUNTER — Encounter: Payer: Self-pay | Admitting: Family Medicine

## 2022-03-31 ENCOUNTER — Ambulatory Visit (INDEPENDENT_AMBULATORY_CARE_PROVIDER_SITE_OTHER)
Admission: RE | Admit: 2022-03-31 | Discharge: 2022-03-31 | Disposition: A | Discharge status: Home / Self Care | Payer: Medicare Other | Source: Ambulatory Visit | Attending: Family Medicine | Admitting: Family Medicine

## 2022-03-31 VITALS — BP 127/78 | HR 87 | Temp 98.2°F | Ht 65.0 in | Wt 201.0 lb

## 2022-03-31 DIAGNOSIS — R0789 Other chest pain: Secondary | ICD-10-CM

## 2022-03-31 DIAGNOSIS — I10 Essential (primary) hypertension: Secondary | ICD-10-CM

## 2022-03-31 DIAGNOSIS — R0781 Pleurodynia: Secondary | ICD-10-CM | POA: Diagnosis not present

## 2022-03-31 MED ORDER — BACLOFEN 10 MG PO TABS: 10.0000 mg | ORAL_TABLET | Freq: Three times a day (TID) | ORAL | 0 refills | Status: DC

## 2022-03-31 NOTE — Patient Instructions (Signed)
It was very nice to see you today!  You may have a rib contusion however we will check an x-ray to make sure that there are no other fractures that were not picked up on the initial xray.   Please start the baclofen.  Let us know if not improving.   Take care, Dr Jerline Pain  PLEASE NOTE:  If you had any lab tests, please let us know if you have not heard back within a few days. You may see your results on mychart before we have a chance to review them but we will give you a call once they are reviewed by [place].   If we ordered any referrals today, please let us know if you have not heard from their office within the next week.   If you had any urgent prescriptions sent in today, please check with the pharmacy within an hour of our visit to make sure the prescription was transmitted appropriately.   Please try these tips to maintain a healthy lifestyle:  Eat at least 3 REAL meals and 1-2 snacks per day.  Aim for no more than 5 hours between eating.  If you eat breakfast, please do so within one hour of getting up.   Each meal should contain half fruits/vegetables, one quarter protein, and one quarter carbs (no bigger than a computer mouse)  Cut down on sweet beverages. This includes juice, soda, and sweet tea.   Drink at least 1 glass of water with each meal and aim for at least 8 glasses per day  Exercise at least 150 minutes every week.
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
## 2022-04-01 NOTE — Progress Notes (Signed)
Please inform patient of the following:  Her x-ray shows a possible right fourth rib fracture.  This should heal up over the next several weeks.  She should let us know if pain is not improving.

## 2022-04-02 DIAGNOSIS — S62307A Unspecified fracture of fifth metacarpal bone, left hand, initial encounter for closed fracture: Secondary | ICD-10-CM | POA: Diagnosis not present

## 2022-04-02 DIAGNOSIS — S62142A Displaced fracture of body of hamate [unciform] bone, left wrist, initial encounter for closed fracture: Secondary | ICD-10-CM | POA: Diagnosis not present

## 2022-04-02 DIAGNOSIS — S62615A Displaced fracture of proximal phalanx of left ring finger, initial encounter for closed fracture: Secondary | ICD-10-CM | POA: Diagnosis not present

## 2022-04-04 DIAGNOSIS — S62142A Displaced fracture of body of hamate [unciform] bone, left wrist, initial encounter for closed fracture: Secondary | ICD-10-CM | POA: Insufficient documentation

## 2022-04-14 ENCOUNTER — Encounter (INDEPENDENT_AMBULATORY_CARE_PROVIDER_SITE_OTHER): Payer: Medicare Other | Admitting: Ophthalmology

## 2022-04-14 DIAGNOSIS — S62142A Displaced fracture of body of hamate [unciform] bone, left wrist, initial encounter for closed fracture: Secondary | ICD-10-CM | POA: Diagnosis not present

## 2022-04-14 DIAGNOSIS — S62615A Displaced fracture of proximal phalanx of left ring finger, initial encounter for closed fracture: Secondary | ICD-10-CM | POA: Diagnosis not present

## 2022-04-14 DIAGNOSIS — S62307A Unspecified fracture of fifth metacarpal bone, left hand, initial encounter for closed fracture: Secondary | ICD-10-CM | POA: Diagnosis not present

## 2022-04-18 ENCOUNTER — Encounter (INDEPENDENT_AMBULATORY_CARE_PROVIDER_SITE_OTHER): Payer: Medicare Other | Admitting: Ophthalmology

## 2022-04-18 DIAGNOSIS — I10 Essential (primary) hypertension: Secondary | ICD-10-CM | POA: Diagnosis not present

## 2022-04-18 DIAGNOSIS — H35033 Hypertensive retinopathy, bilateral: Secondary | ICD-10-CM | POA: Diagnosis not present

## 2022-04-18 DIAGNOSIS — H43813 Vitreous degeneration, bilateral: Secondary | ICD-10-CM | POA: Diagnosis not present

## 2022-04-18 DIAGNOSIS — H353231 Exudative age-related macular degeneration, bilateral, with active choroidal neovascularization: Secondary | ICD-10-CM

## 2022-04-18 DIAGNOSIS — M79641 Pain in right hand: Secondary | ICD-10-CM | POA: Insufficient documentation

## 2022-04-18 DIAGNOSIS — S62306A Unspecified fracture of fifth metacarpal bone, right hand, initial encounter for closed fracture: Secondary | ICD-10-CM | POA: Insufficient documentation

## 2022-04-18 HISTORY — DX: Unspecified fracture of fifth metacarpal bone, right hand, initial encounter for closed fracture: S62.306A

## 2022-04-21 ENCOUNTER — Encounter (INDEPENDENT_AMBULATORY_CARE_PROVIDER_SITE_OTHER): Payer: Medicare Other | Admitting: Ophthalmology

## 2022-04-23 ENCOUNTER — Other Ambulatory Visit: Payer: Self-pay

## 2022-04-23 ENCOUNTER — Encounter (HOSPITAL_BASED_OUTPATIENT_CLINIC_OR_DEPARTMENT_OTHER): Payer: Self-pay | Admitting: Orthopedic Surgery

## 2022-04-27 NOTE — H&P (Signed)
Preoperative History & Physical Exam  Surgeon: Matt Holmes, MD  Diagnosis: closed fracture of fifth metacarpal bone of left hand, ring proximal phalanx and hamate fractures  Planned Procedure: Procedure(s) (LRB): OPEN REDUCTION INTERNAL FIXATION versus Closed reduction percutaneous pinning, left ring finger proximal phalanx, 5th metacarpal and hammate fracture (Left)  History of Present Illness:   Patient is a 77 y.o. female with symptoms consistent with closed fracture of fifth metacarpal bone of left hand, ring proximal phalanx and hamate fractures who presents for surgical intervention. The risks, benefits and alternatives of surgical intervention were discussed and informed consent was obtained prior to surgery.  Past Medical History:  Past Medical History:  Diagnosis Date   Anxiety    Arthritis    Blood transfusion without reported diagnosis    Cataract    Cough 01/11/2014   Depression    Diabetes type 2, controlled ([place])    Esophageal reflux disease    Hypercholesterolemia    Hypertension    Ischemic colitis ([place]) 01/28/2007   Liver enzyme elevation    Lung nodule 06/11/2012   Macular degeneration, wet ([place])    Multiple myeloma ([place]) 05/27/2012   Cytogenetics on 06/08/2012 was normal 45, XX [20]   Osteoporosis    Other fatigue 02/08/2014   Oxygen deficiency    Pancytopenia ([place])    Pneumonia    Post-nasal drip 01/11/2014   Sepsis ([place])    Sleep apnea    does not use CPAP   Tuberculosis     (+ TB TEST)YEARS AGO POSSIBLE EXPOSURE WAS MED TX    Type 2 diabetes mellitus without complications ([place]) 99991111    Past Surgical History:  Past Surgical History:  Procedure Laterality Date   APPENDECTOMY  1998   COLONOSCOPY  01/28/2011   poly; Dr. Oletta Lamas, next one due 2018.l   LEG SURGERY     Stem cell  2015   TONSILLECTOMY     VIDEO BRONCHOSCOPY WITH ENDOBRONCHIAL NAVIGATION Right 07/08/2012   Procedure: [place];   Surgeon: Collene Gobble, MD;  Location: [facility] OR;  Service: Thoracic;  Laterality: Right;    Medications:  Prior to Admission medications   Medication Sig Start Date End Date Taking? Authorizing Provider  buPROPion (WELLBUTRIN XL) 300 MG 24 hr tablet Take 300 mg by mouth daily.   Yes [provider]  cephALEXin (KEFLEX) 250 MG capsule Take 1 capsule (250 mg total) by mouth daily. 12/10/21  Yes Tawnya Crook, MD  cholecalciferol (VITAMIN D) 1000 UNITS tablet Take 2,000 Units by mouth daily.   Yes [provider]  ketoconazole (NIZORAL) 2 % cream Apply 1 Application topically 2 (two) times daily. 09/24/21  Yes Tawnya Crook, MD  losartan (COZAAR) 50 MG tablet Take 50 mg by mouth daily.   Yes [provider]  magnesium chloride (SLOW-MAG) 64 MG TBEC SR tablet Take 1 tablet by mouth 2 (two) times daily.    Yes [provider]  metFORMIN (GLUCOPHAGE) 500 MG tablet Take 1 in the morning and 2 in the evening by mouth 12/10/21  Yes Tawnya Crook, MD  metoprolol succinate (TOPROL-XL) 50 MG 24 hr tablet Take 50 mg by mouth daily. Take with or immediately following a meal.   Yes [provider]  Multiple Vitamins-Minerals (MULTIVITAMIN WITH MINERALS) tablet Take 1 tablet by mouth at bedtime.    Yes [provider]  Multiple Vitamins-Minerals (PRESERVISION AREDS 2) CAPS Take 1 capsule by mouth 2 (two) times daily.  Yes [provider]  prazosin (MINIPRESS) 1 MG capsule TAKE 1 CAPSULE BY MOUTH AT BEDTIME. 01/21/22  Yes Tawnya Crook, MD  simvastatin (ZOCOR) 40 MG tablet Take 40 mg by mouth at bedtime.    Yes [provider]  tretinoin (RETIN-A) 0.025 % cream Apply topically at bedtime. milia 01/01/22  Yes Tawnya Crook, MD  trimethoprim-polymyxin b [facility]) ophthalmic solution Place into both eyes. 06/13/19  Yes [provider]  venlafaxine XR (EFFEXOR-XR) 150 MG 24 hr capsule TAKE 1 CAPSULE BY MOUTH EVERY DAY WITH  FOOD FOR 30 DAYS 02/12/22  Yes Tawnya Crook, MD  Continuous Blood Gluc Receiver (FREESTYLE LIBRE 2 READER) DEVI Use to test blood sugar 12/11/21   Tawnya Crook, MD  Continuous Blood Gluc Sensor (FREESTYLE LIBRE 2 SENSOR) MISC Use to test blood sugar 01/03/22   Tawnya Crook, MD    Allergies:  Patient has no known allergies.  Review of Systems: Negative except per HPI.  Physical Exam: Alert and oriented, NAD Head and neck: no masses, normal alignment CV: pulse intact Pulm: no increased work of breathing, respirations even and unlabored Abdomen: non-distended Extremities: extremities warm and well perfused  LABS: Recent Results (from the past 2160 hour(s))  Potassium     Status: None   Collection Time: 01/30/22  9:08 AM  Result Value Ref Range   Potassium 4.9 3.5 - 5.1 mEq/L     Complete History and Physical exam available in the office notes  [name]

## 2022-04-28 ENCOUNTER — Encounter (HOSPITAL_BASED_OUTPATIENT_CLINIC_OR_DEPARTMENT_OTHER)
Admission: RE | Admit: 2022-04-28 | Discharge: 2022-04-28 | Disposition: A | Discharge status: Home / Self Care | Payer: Medicare Other | Source: Ambulatory Visit | Attending: Orthopedic Surgery | Admitting: Orthopedic Surgery

## 2022-04-28 ENCOUNTER — Ambulatory Visit (INDEPENDENT_AMBULATORY_CARE_PROVIDER_SITE_OTHER): Payer: Medicare Other | Admitting: Clinical

## 2022-04-28 DIAGNOSIS — C9000 Multiple myeloma not having achieved remission: Secondary | ICD-10-CM | POA: Diagnosis not present

## 2022-04-28 DIAGNOSIS — Z87891 Personal history of nicotine dependence: Secondary | ICD-10-CM | POA: Diagnosis not present

## 2022-04-28 DIAGNOSIS — I10 Essential (primary) hypertension: Secondary | ICD-10-CM | POA: Diagnosis not present

## 2022-04-28 DIAGNOSIS — E7800 Pure hypercholesterolemia, unspecified: Secondary | ICD-10-CM | POA: Diagnosis not present

## 2022-04-28 DIAGNOSIS — G4730 Sleep apnea, unspecified: Secondary | ICD-10-CM | POA: Diagnosis not present

## 2022-04-28 DIAGNOSIS — S62327A Displaced fracture of shaft of fifth metacarpal bone, left hand, initial encounter for closed fracture: Secondary | ICD-10-CM | POA: Diagnosis not present

## 2022-04-28 DIAGNOSIS — X58XXXA Exposure to other specified factors, initial encounter: Secondary | ICD-10-CM | POA: Diagnosis not present

## 2022-04-28 DIAGNOSIS — F419 Anxiety disorder, unspecified: Secondary | ICD-10-CM | POA: Diagnosis not present

## 2022-04-28 DIAGNOSIS — E119 Type 2 diabetes mellitus without complications: Secondary | ICD-10-CM | POA: Diagnosis not present

## 2022-04-28 DIAGNOSIS — K219 Gastro-esophageal reflux disease without esophagitis: Secondary | ICD-10-CM | POA: Diagnosis not present

## 2022-04-28 DIAGNOSIS — F4320 Adjustment disorder, unspecified: Secondary | ICD-10-CM

## 2022-04-28 DIAGNOSIS — Z79899 Other long term (current) drug therapy: Secondary | ICD-10-CM | POA: Diagnosis not present

## 2022-04-28 DIAGNOSIS — Z01812 Encounter for preprocedural laboratory examination: Secondary | ICD-10-CM | POA: Insufficient documentation

## 2022-04-28 DIAGNOSIS — Z7984 Long term (current) use of oral hypoglycemic drugs: Secondary | ICD-10-CM | POA: Diagnosis not present

## 2022-04-28 DIAGNOSIS — S62142A Displaced fracture of body of hamate [unciform] bone, left wrist, initial encounter for closed fracture: Secondary | ICD-10-CM | POA: Diagnosis not present

## 2022-04-28 DIAGNOSIS — F32A Depression, unspecified: Secondary | ICD-10-CM | POA: Diagnosis not present

## 2022-04-28 DIAGNOSIS — S62615A Displaced fracture of proximal phalanx of left ring finger, initial encounter for closed fracture: Secondary | ICD-10-CM | POA: Diagnosis not present

## 2022-04-28 LAB — BASIC METABOLIC PANEL
Anion gap: 10 (ref 5–15)
BUN: 26 mg/dL — ABNORMAL HIGH (ref 8–23)
CO2: 22 mmol/L (ref 22–32)
Calcium: 8.9 mg/dL (ref 8.9–10.3)
Chloride: 105 mmol/L (ref 98–111)
Creatinine, Ser: 1.03 mg/dL — ABNORMAL HIGH (ref 0.44–1.00)
GFR, Estimated: 57 mL/min — ABNORMAL LOW (ref >60)
Glucose, Bld: 126 mg/dL — ABNORMAL HIGH (ref 70–99)
Potassium: 4.5 mmol/L (ref 3.5–5.1)
Sodium: 137 mmol/L (ref 135–145)

## 2022-04-28 NOTE — Progress Notes (Signed)
       Patient Instructions  The night before surgery:  No food after midnight. ONLY clear liquids after midnight  The day of surgery (if you do NOT have diabetes):  Drink ONE (1) Pre-Surgery Clear Ensure as directed.   This drink was given to you during your hospital  pre-op appointment visit. The pre-op nurse will instruct you on the time to drink the  Pre-Surgery Ensure depending on your surgery time. Finish the drink at the designated time by the pre-op nurse.  Nothing else to drink after completing the  Pre-Surgery Clear Ensure.  The day of surgery (if you have diabetes): Drink ONE (1) Gatorade 2 (G2) as directed. This drink was given to you during your hospital  pre-op appointment visit.  The pre-op nurse will instruct you on the time to drink the   Gatorade 2 (G2) depending on your surgery time. Color of the Gatorade may vary. Red is not allowed. Nothing else to drink after completing the  Gatorade 2 (G2).         If you have questions, please contact your surgeon's office.    Surgical soap given to patient, instructions given, patient verbalized understanding.   
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
## 2022-04-29 ENCOUNTER — Other Ambulatory Visit: Payer: Self-pay

## 2022-04-29 ENCOUNTER — Ambulatory Visit (HOSPITAL_BASED_OUTPATIENT_CLINIC_OR_DEPARTMENT_OTHER): Payer: Medicare Other | Admitting: Certified Registered"

## 2022-04-29 ENCOUNTER — Encounter (HOSPITAL_BASED_OUTPATIENT_CLINIC_OR_DEPARTMENT_OTHER)
Admission: RE | Disposition: A | Discharge status: Home / Self Care | Payer: Self-pay | Source: Home / Self Care | Attending: Orthopedic Surgery

## 2022-04-29 ENCOUNTER — Encounter (HOSPITAL_BASED_OUTPATIENT_CLINIC_OR_DEPARTMENT_OTHER): Payer: Self-pay | Admitting: Orthopedic Surgery

## 2022-04-29 ENCOUNTER — Ambulatory Visit (HOSPITAL_BASED_OUTPATIENT_CLINIC_OR_DEPARTMENT_OTHER)
Admission: RE | Admit: 2022-04-29 | Discharge: 2022-04-29 | Disposition: A | Discharge status: Home / Self Care | Payer: Medicare Other | Attending: Orthopedic Surgery | Admitting: Orthopedic Surgery

## 2022-04-29 ENCOUNTER — Ambulatory Visit (HOSPITAL_BASED_OUTPATIENT_CLINIC_OR_DEPARTMENT_OTHER): Payer: Medicare Other

## 2022-04-29 DIAGNOSIS — Z87891 Personal history of nicotine dependence: Secondary | ICD-10-CM | POA: Insufficient documentation

## 2022-04-29 DIAGNOSIS — S62304A Unspecified fracture of fourth metacarpal bone, right hand, initial encounter for closed fracture: Secondary | ICD-10-CM | POA: Diagnosis not present

## 2022-04-29 DIAGNOSIS — S62615A Displaced fracture of proximal phalanx of left ring finger, initial encounter for closed fracture: Secondary | ICD-10-CM | POA: Insufficient documentation

## 2022-04-29 DIAGNOSIS — K219 Gastro-esophageal reflux disease without esophagitis: Secondary | ICD-10-CM | POA: Diagnosis not present

## 2022-04-29 DIAGNOSIS — E7800 Pure hypercholesterolemia, unspecified: Secondary | ICD-10-CM | POA: Insufficient documentation

## 2022-04-29 DIAGNOSIS — S62307A Unspecified fracture of fifth metacarpal bone, left hand, initial encounter for closed fracture: Secondary | ICD-10-CM

## 2022-04-29 DIAGNOSIS — F32A Depression, unspecified: Secondary | ICD-10-CM | POA: Insufficient documentation

## 2022-04-29 DIAGNOSIS — Z7984 Long term (current) use of oral hypoglycemic drugs: Secondary | ICD-10-CM | POA: Insufficient documentation

## 2022-04-29 DIAGNOSIS — S62327A Displaced fracture of shaft of fifth metacarpal bone, left hand, initial encounter for closed fracture: Secondary | ICD-10-CM | POA: Diagnosis not present

## 2022-04-29 DIAGNOSIS — E119 Type 2 diabetes mellitus without complications: Secondary | ICD-10-CM

## 2022-04-29 DIAGNOSIS — G4730 Sleep apnea, unspecified: Secondary | ICD-10-CM | POA: Insufficient documentation

## 2022-04-29 DIAGNOSIS — I10 Essential (primary) hypertension: Secondary | ICD-10-CM | POA: Diagnosis not present

## 2022-04-29 DIAGNOSIS — F419 Anxiety disorder, unspecified: Secondary | ICD-10-CM | POA: Insufficient documentation

## 2022-04-29 DIAGNOSIS — C9000 Multiple myeloma not having achieved remission: Secondary | ICD-10-CM | POA: Insufficient documentation

## 2022-04-29 DIAGNOSIS — S62142A Displaced fracture of body of hamate [unciform] bone, left wrist, initial encounter for closed fracture: Secondary | ICD-10-CM | POA: Insufficient documentation

## 2022-04-29 DIAGNOSIS — Z79899 Other long term (current) drug therapy: Secondary | ICD-10-CM | POA: Insufficient documentation

## 2022-04-29 DIAGNOSIS — X58XXXA Exposure to other specified factors, initial encounter: Secondary | ICD-10-CM | POA: Insufficient documentation

## 2022-04-29 HISTORY — PX: OPEN REDUCTION INTERNAL FIXATION (ORIF) HAND: SHX5991

## 2022-04-29 LAB — GLUCOSE, CAPILLARY: Glucose-Capillary: 142 mg/dL — ABNORMAL HIGH (ref 70–99)

## 2022-04-29 SURGERY — OPEN REDUCTION INTERNAL FIXATION (ORIF) HAND
Anesthesia: Monitor Anesthesia Care | Site: Hand | Laterality: Left

## 2022-04-29 MED ORDER — LACTATED RINGERS IV SOLN: INTRAVENOUS | Status: DC

## 2022-04-29 MED ORDER — BACITRACIN ZINC 500 UNIT/GM EX OINT
TOPICAL_OINTMENT | CUTANEOUS | Status: DC | PRN
  Administered 2022-04-29: 1 via TOPICAL

## 2022-04-29 MED ORDER — ONDANSETRON HCL 4 MG/2ML IJ SOLN
INTRAMUSCULAR | Status: DC | PRN
  Administered 2022-04-29: 4 mg via INTRAVENOUS

## 2022-04-29 MED ORDER — CEFAZOLIN SODIUM-DEXTROSE 2-4 GM/100ML-% IV SOLN
2.0000 g | INTRAVENOUS | Status: AC
  Administered 2022-04-29: 2 g via INTRAVENOUS

## 2022-04-29 MED ORDER — PROPOFOL 10 MG/ML IV BOLUS
INTRAVENOUS | Status: DC | PRN
  Administered 2022-04-29: 20 mg via INTRAVENOUS

## 2022-04-29 MED ORDER — MIDAZOLAM HCL 2 MG/2ML IJ SOLN
INTRAMUSCULAR | Status: AC
  Filled 2022-04-29: qty 2

## 2022-04-29 MED ORDER — 0.9 % SODIUM CHLORIDE (POUR BTL) OPTIME
TOPICAL | Status: DC | PRN
  Administered 2022-04-29: 1000 mL

## 2022-04-29 MED ORDER — FENTANYL CITRATE (PF) 100 MCG/2ML IJ SOLN
INTRAMUSCULAR | Status: AC
  Filled 2022-04-29: qty 2

## 2022-04-29 MED ORDER — CEFAZOLIN SODIUM-DEXTROSE 2-4 GM/100ML-% IV SOLN
INTRAVENOUS | Status: AC
  Filled 2022-04-29: qty 100

## 2022-04-29 MED ORDER — HYDROCODONE-ACETAMINOPHEN 5-325 MG PO TABS: 1.0000 | ORAL_TABLET | Freq: Four times a day (QID) | ORAL | 0 refills | Status: AC | PRN

## 2022-04-29 MED ORDER — ACETAMINOPHEN 500 MG PO TABS
ORAL_TABLET | ORAL | Status: AC
  Filled 2022-04-29: qty 2

## 2022-04-29 MED ORDER — BUPIVACAINE HCL (PF) 0.5 % IJ SOLN
INTRAMUSCULAR | Status: DC | PRN
  Administered 2022-04-29: 10 mL

## 2022-04-29 MED ORDER — PROPOFOL 500 MG/50ML IV EMUL
INTRAVENOUS | Status: DC | PRN
  Administered 2022-04-29: 100 ug/kg/min via INTRAVENOUS

## 2022-04-29 MED ORDER — ACETAMINOPHEN 500 MG PO TABS
1000.0000 mg | ORAL_TABLET | Freq: Once | ORAL | Status: AC
  Administered 2022-04-29: 1000 mg via ORAL

## 2022-04-29 MED ORDER — FENTANYL CITRATE (PF) 100 MCG/2ML IJ SOLN: 25.0000 ug | INTRAMUSCULAR | Status: DC | PRN

## 2022-04-29 MED ORDER — LIDOCAINE HCL 1 % IJ SOLN
INTRAMUSCULAR | Status: DC | PRN
  Administered 2022-04-29: 10 mL

## 2022-04-29 MED ORDER — DEXMEDETOMIDINE HCL IN NACL 80 MCG/20ML IV SOLN
INTRAVENOUS | Status: DC | PRN
  Administered 2022-04-29: 8 ug via BUCCAL

## 2022-04-29 SURGICAL SUPPLY — 36 items
BLADE SURG 15 STRL LF DISP TIS (BLADE) ×1 IMPLANT
BLADE SURG 15 STRL SS (BLADE) ×1
BNDG CMPR 9X4 STRL LF SNTH (GAUZE/BANDAGES/DRESSINGS) ×1
BNDG ELASTIC 4X5.8 VLCR STR LF (GAUZE/BANDAGES/DRESSINGS) ×1 IMPLANT
BNDG ESMARK 4X9 LF (GAUZE/BANDAGES/DRESSINGS) ×1 IMPLANT
COVER BACK TABLE 60X90IN (DRAPES) ×1 IMPLANT
CUFF TOURN SGL QUICK 18X4 (TOURNIQUET CUFF) ×1 IMPLANT
DRAPE EXTREMITY T 121X128X90 (DISPOSABLE) ×1 IMPLANT
DRAPE OEC MINIVIEW 54X84 (DRAPES) ×1 IMPLANT
DRAPE SURG 17X23 STRL (DRAPES) ×1 IMPLANT
DRSG EMULSION OIL 3X3 NADH (GAUZE/BANDAGES/DRESSINGS) ×1 IMPLANT
GAUZE SPONGE 4X4 12PLY STRL (GAUZE/BANDAGES/DRESSINGS) ×1 IMPLANT
GLOVE BIO SURGEON STRL SZ7.5 (GLOVE) ×1 IMPLANT
GLOVE BIOGEL PI IND STRL 7.5 (GLOVE) ×1 IMPLANT
GOWN STRL REUS W/ TWL LRG LVL3 (GOWN DISPOSABLE) ×1 IMPLANT
GOWN STRL REUS W/TWL LRG LVL3 (GOWN DISPOSABLE) ×2
GOWN STRL REUS W/TWL XL LVL3 (GOWN DISPOSABLE) ×1 IMPLANT
K-WIRE DBL .045X4 NSTRL (WIRE) ×2
KWIRE DBL .045X4 NSTRL (WIRE) IMPLANT
NDL HYPO 22X1.5 SAFETY MO (MISCELLANEOUS) IMPLANT
NEEDLE HYPO 22X1.5 SAFETY MO (MISCELLANEOUS) ×1 IMPLANT
NS IRRIG 1000ML POUR BTL (IV SOLUTION) ×1 IMPLANT
PACK BASIN DAY SURGERY FS (CUSTOM PROCEDURE TRAY) ×1 IMPLANT
PADDING CAST ABS COTTON 4X4 ST (CAST SUPPLIES) ×1 IMPLANT
PADDING CAST SYNTHETIC 4X4 STR (CAST SUPPLIES) ×1 IMPLANT
SHEET MEDIUM DRAPE 40X70 STRL (DRAPES) ×1 IMPLANT
SPLINT FIBERGLASS 3X35 (CAST SUPPLIES) IMPLANT
SPLINT FIBERGLASS 4X30 (CAST SUPPLIES) IMPLANT
SUT ETHILON 4 0 PS 2 18 (SUTURE) ×1 IMPLANT
SUT VIC AB 4-0 PS2 27 (SUTURE) IMPLANT
SYR 10ML LL (SYRINGE) IMPLANT
SYR BULB EAR ULCER 3OZ GRN STR (SYRINGE) ×1 IMPLANT
TAPE SURG TRANSPORE 1 IN (GAUZE/BANDAGES/DRESSINGS) ×1 IMPLANT
TOWEL GREEN STERILE FF (TOWEL DISPOSABLE) ×2 IMPLANT
TRAY DSU PREP LF (CUSTOM PROCEDURE TRAY) ×1 IMPLANT
UNDERPAD 30X36 HEAVY ABSORB (UNDERPADS AND DIAPERS) ×1 IMPLANT

## 2022-04-29 NOTE — Interval H&P Note (Signed)
History and Physical Interval Note:  04/29/2022 9:28 AM  [name]  has presented today for surgery, with the diagnosis of closed fracture of fifth metacarpal bone of right hand.  The various methods of treatment have been discussed with the patient and family. After consideration of risks, benefits and other options for treatment, the patient has consented to  Procedure(s) with comments: OPEN REDUCTION INTERNAL FIXATION versus Closed reduction percutaneous pinning, left ring finger proximal phalanx, 5th metacarpal and hammate fracture (Left) - mac and regional as a surgical intervention.  The patient's history has been reviewed, patient examined, no change in status, stable for surgery.  I have reviewed the patient's chart and labs.  Questions were answered to the patient's satisfaction.     [name]

## 2022-04-29 NOTE — Transfer of Care (Signed)
Immediate Anesthesia Transfer of Care Note  Patient: [name]  Procedure(s) Performed: OPEN REDUCTION INTERNAL FIXATION with percutaneous pinning, left ring finger proximal phalanx (Left: Hand)  Patient Location: PACU  Anesthesia Type:MAC  Level of Consciousness: awake, alert , oriented, and patient cooperative  Airway & Oxygen Therapy: Patient Spontanous Breathing  Post-op Assessment: Report given to RN and Post -op Vital signs reviewed and stable  Post vital signs: Reviewed and stable  Last Vitals:  Vitals Value Taken Time  BP    Temp    Pulse    Resp    SpO2      Last Pain:  Vitals:   04/29/22 0854  TempSrc: Oral  PainSc: 0-No pain         Complications: No notable events documented.

## 2022-04-29 NOTE — Anesthesia Preprocedure Evaluation (Addendum)
Anesthesia Evaluation  Patient identified by MRN, date of birth, ID band Patient awake    Reviewed: Allergy & Precautions, H&P , NPO status , Patient's Chart, lab work & pertinent test results  Airway Mallampati: III  TM Distance: >3 FB Neck ROM: Full    Dental no notable dental hx. (+) Teeth Intact, Dental Advisory Given   Pulmonary former smoker   Pulmonary exam normal breath sounds clear to auscultation       Cardiovascular hypertension, Pt. on medications  Rhythm:Regular Rate:Normal     Neuro/Psych   Anxiety Depression    negative neurological ROS     GI/Hepatic Neg liver ROS,GERD  ,,  Endo/Other  diabetes, Type 2, Oral Hypoglycemic Agents    Renal/GU Renal InsufficiencyRenal disease  negative genitourinary   Musculoskeletal  (+) Arthritis , Osteoarthritis,    Abdominal   Peds  Hematology  (+) Blood dyscrasia, anemia   Anesthesia Other Findings   Reproductive/Obstetrics negative OB ROS                             Anesthesia Physical Anesthesia Plan  ASA: 3  Anesthesia Plan: MAC   Post-op Pain Management: Tylenol PO (pre-op)*   Induction: Intravenous  PONV Risk Score and Plan: 3 and Propofol infusion and Ondansetron  Airway Management Planned: Natural Airway and Simple Face Mask  Additional Equipment:   Intra-op Plan:   Post-operative Plan:   Informed Consent: I have reviewed the patients History and Physical, chart, labs and discussed the procedure including the risks, benefits and alternatives for the proposed anesthesia with the patient or authorized representative who has indicated his/her understanding and acceptance.     Dental advisory given  Plan Discussed with: CRNA  Anesthesia Plan Comments:        Anesthesia Quick Evaluation

## 2022-04-29 NOTE — Discharge Instructions (Addendum)
Orthopaedic Hand Surgery Discharge Instructions  WEIGHT BEARING STATUS: Non weight bearing on operative extremity  DRESSING CARE: Please keep your dressing/splint/cast clean and dry until your follow-up appointment. You may shower by placing a waterproof covering over your dressing/splint/cast. Contact your surgeon if your splint/cast gets wet. It will need to be changed to prevent skin breakdown.  PAIN CONTROL: First line medications for post operative pain control are Tylenol (acetaminophen) and Motrin (ibuprofen) if you are able to take these medications. If you have been prescribed a medication these can be taken as breakthrough pain medications. Please note that some narcotic pain medication has acetaminophen added and you should never consume more than 4,000mg  of acetaminophen in 24-hour period. Please note that if you are given Toradol (ketorolac) you should not take similar medications such as ibuprofen or naproxen.  DISCHARGE MEDICATIONS: If you have been prescribed medication it was sent electronically to your pharmacy. No changes have been made to your home medications.  ICE/ELEVATION: Ice and elevate your injured extremity as needed. Avoid direct contact of ice with skin.   BANDAGE FEELS TOO TIGHT: If your bandage feels too tight, first make sure you are elevating your fingers as much as possible. The outer layer of the bandage can be unwrapped and reapplied more loosely. If no improvement, you may carefully cut the inner layer longitudinally until the pressure has resolved and then rewrap the outer layer. If you are not comfortable with these instructions, please call the office and the bandage can be changed for you.   FOLLOW UP: You will be called after surgery with an appointment date and time, however if you have not received a phone call within 3 days, please call during regular office hours at [phone] to schedule a post operative appointment.  Please Seek Medical Attention  if: Call MD for: pain or pressure in chest, jaw, arm, back, neck  Call MD for: temperature greater than 101 F for more than 24 hrs Call MD for: difficulty breathing Call MD for: incision redness, bleeding, drainage  Call MD for: palpitations or feeling that the heart is racing  Call MD for: increased swelling in arm, leg, ankle, or abdomen  Call MD for: lightheadedness, dizziness, fainting Call 911 or go to ER for any medical emergency if you are not able to get in touch with your doctor   J. Sable Feil, MD Orthopaedic Hand Surgeon [facility] Office number: [phone] [address] [place], [place] [address]   No tylenol until 3pm   Post Anesthesia Home Care Instructions  Activity: Get plenty of rest for the remainder of the day. A responsible individual must stay with you for 24 hours following the procedure.  For the next 24 hours, DO NOT: -Drive a car -Paediatric nurse -Drink alcoholic beverages -Take any medication unless instructed by your physician -Make any legal decisions or sign important papers.  Meals: Start with liquid foods such as gelatin or soup. Progress to regular foods as tolerated. Avoid greasy, spicy, heavy foods. If nausea and/or vomiting occur, drink only clear liquids until the nausea and/or vomiting subsides. Call your physician if vomiting continues.  Special Instructions/Symptoms: Your throat may feel dry or sore from the anesthesia or the breathing tube placed in your throat during surgery. If this causes discomfort, gargle with warm salt water. The discomfort should disappear within 24 hours.  If you had a scopolamine patch placed behind your ear for the management of post- operative nausea and/or vomiting:  1. The medication in  the patch is effective for 72 hours, after which it should be removed.  Wrap patch in a tissue and discard in the trash. Wash hands thoroughly with soap and water. 2. You may remove the patch earlier than  72 hours if you experience unpleasant side effects which may include dry mouth, dizziness or visual disturbances. 3. Avoid touching the patch. Wash your hands with soap and water after contact with the patch.

## 2022-04-29 NOTE — Anesthesia Postprocedure Evaluation (Signed)
Anesthesia Post Note  Patient: [name]  Procedure(s) Performed: OPEN REDUCTION INTERNAL FIXATION with percutaneous pinning, left ring finger proximal phalanx (Left: Hand)     Patient location during evaluation: PACU Anesthesia Type: MAC Level of consciousness: awake Pain management: pain level controlled Vital Signs Assessment: post-procedure vital signs reviewed and stable Respiratory status: spontaneous breathing, nonlabored ventilation and respiratory function stable Cardiovascular status: blood pressure returned to baseline and stable Postop Assessment: no apparent nausea or vomiting Anesthetic complications: no   No notable events documented.  Last Vitals:  Vitals:   04/29/22 1115 04/29/22 1127  BP: 119/68 125/69  Pulse: 66 67  Resp: 16 17  Temp:  (!) 36.4 C  SpO2: 92% 94%    Last Pain:  Vitals:   04/29/22 0854  TempSrc: Oral  PainSc: 0-No pain                 Providence Stivers P Jahvier Aldea

## 2022-04-29 NOTE — Op Note (Signed)
OPERATIVE NOTE  DATE OF PROCEDURE: 04/29/2022  SURGEONS:  Primary: [name], MD  PREOPERATIVE DIAGNOSIS: Left ring finger proximal phalanx fracture, left fifth metacarpal shaft fracture.  Left hand hamate fracture.  POSTOPERATIVE DIAGNOSIS: Same  NAME OF PROCEDURE:   Left ring finger proximal phalanx open reduction internal fixation Closed treatment of left fifth metacarpal and hamate fractures. Closed treatment of right hand fourth metacarpal fracture. 4 view radiographs of the left hand confirmed adequate length alignment rotation of the fractures and hardware placement to the left ring finger proximal phalanx.  ANESTHESIA: Monitor Anesthesia Care plus local  SKIN PREPARATION: Hibiclens  ESTIMATED BLOOD LOSS: Minimal  IMPLANTS: 0.045 inch K wires x 2  INDICATIONS:  [name] is a 77 y.o. female who has the above preoperative diagnosis. The patient has decided to proceed with surgical intervention.  Risks, benefits and alternatives of operative management were discussed including, but not limited to, risks of anesthesia complications, infection, pain, persistent symptoms, stiffness, need for future surgery.  The patient understands, agrees and elects to proceed with surgery.    DESCRIPTION OF PROCEDURE: The patient was met in the pre-operative area and their identity was verified.  The operative location and laterality was also verified and marked.  The patient was brought to the OR and was placed supine on the table.  After repeat patient identification with the operative team anesthesia was provided and the patient was prepped and draped in the usual sterile fashion.  A final timeout was performed verifying the correction patient, procedure, location and laterality.  The left upper extremity was elevated exsanguinated with an Esmarch and tourniquet inflated to 250 mmHg.  The left ring finger proximal phalanx was identified and was severely extended and malrotated with scissoring of the  digits.  Close reduction was a attempted however the fracture had already consolidated too much for adequate reduction.  Thus open reduction was performed.  A curvilinear incision was made over the dorsal aspect of the left ring finger.  Skin subtenons tissues were divided and careful hemostasis was obtained.  The extensor tendon was divided.  The left ring finger proximal phalanx fracture was identified and was mobilized.  The surrounding callus was removed and the fracture was open reduced.  0.045 K wires x 2 were placed in transarticular fashion.  The pins were cut beneath the skin.  The wound was thoroughly irrigated and closure of the extensor mechanism was performed with 4-0 Vicryl suture.  The skin was closed with 4-0 nylon suture.  A sterile bandage and splint was applied.  The tourniquet was deflated and finger was pink and warm well-perfused with brisk capillary refill.  Closed treatment of the left fifth metacarpal and hamate fracture was performed as the fractures had consolidated with no displacement compared to previous films.  Of note the patient also had a right fourth metacarpal fracture that occurred after the last time I saw her.  She was seen at the urgent care and we discussed treatment for her right hand today in the preoperative area.  We decided to move forward with closed treatment.  I did change her splint for her today as it was falling off with a new MP flexion block splint.  Was awoken from anesthesia and brought to PACU for recovery in stable condition.   Matt Holmes, MD

## 2022-04-30 ENCOUNTER — Encounter (HOSPITAL_BASED_OUTPATIENT_CLINIC_OR_DEPARTMENT_OTHER): Payer: Self-pay | Admitting: Orthopedic Surgery

## 2022-05-12 DIAGNOSIS — M79641 Pain in right hand: Secondary | ICD-10-CM | POA: Diagnosis not present

## 2022-05-12 DIAGNOSIS — M79642 Pain in left hand: Secondary | ICD-10-CM | POA: Diagnosis not present

## 2022-05-15 ENCOUNTER — Ambulatory Visit: Payer: Medicare Other | Admitting: Clinical

## 2022-05-15 NOTE — Progress Notes (Unsigned)
                Teddie Curd L Gilad Dugger, PhD 

## 2022-05-22 ENCOUNTER — Ambulatory Visit (INDEPENDENT_AMBULATORY_CARE_PROVIDER_SITE_OTHER): Payer: Medicare Other | Admitting: Clinical

## 2022-05-22 DIAGNOSIS — F4323 Adjustment disorder with mixed anxiety and depressed mood: Secondary | ICD-10-CM | POA: Diagnosis not present

## 2022-05-22 NOTE — Progress Notes (Signed)
Time: 2:00-3:00pm CPT Code: 14782N Diagnosis:  F43.23  Sheretta was seen in person for individual therapy. Session began by creating a plan for treatment. Kaylei provided input to all aspects of treatment plan and verbal consent. She shared current medications: Prazosin (1  capsule by mouth as bedtime)-minipress, taking 2-3 months, minimal effect noticed. Venlafaxine,  daily. Buproprion, , Kozar  daily. Gwendelyn also shared more events from her past to provide context for therapy. She is scheduled to be seen again in two weeks.  Treatment Plan Client Abilities/Strengths  Darcus described herself as perserverant, receptive to suggestions.  Client Treatment Preferences: Malene shared that she prefers afternoon appointments, and would like to alternate virtual vs in person sessions.  Client Statement of Needs  Courtnee shared that she would like to process past events in order to reduce their impact on her current functioning and move forward. She shared concerns that she may have symptoms of PTSD. Treatment Level   Caroll would like to start with weekly appointments  Symptoms  Maryalyce shared that she self-medicates with alcohol and drinks more than she would like to (up to a bottle of wine per day), fatigue, lack of purpose (which she attributes in part to limitations resulting from cancer) Problems Addressed  Evvie would like to address and reduce the frequency and intensity of memories of troubling experiences.  Goals 1. [place] would like to process past events in order to move forward in her life as it is now Objective Jayliana would like to feel less bothered and controlled by past events   Target Date: 05/22/2023 Frequency: Weekly  Progress: 0 Modality: Individual   Related Interventions Taelyr will be provided with opportunities to process experiences in session Therapist to notice and disengage from maladaptive thoughts and behaviors using CBT based strategies  Rmoni will process events from her past  and explore their impact on her functioning Therapist will provide referrals for additional resources as appropriate                Chrissie Noa, PhD

## 2022-05-23 ENCOUNTER — Encounter (INDEPENDENT_AMBULATORY_CARE_PROVIDER_SITE_OTHER): Payer: Medicare Other | Admitting: Ophthalmology

## 2022-05-23 DIAGNOSIS — H35033 Hypertensive retinopathy, bilateral: Secondary | ICD-10-CM | POA: Diagnosis not present

## 2022-05-23 DIAGNOSIS — I10 Essential (primary) hypertension: Secondary | ICD-10-CM

## 2022-05-23 DIAGNOSIS — H353231 Exudative age-related macular degeneration, bilateral, with active choroidal neovascularization: Secondary | ICD-10-CM | POA: Diagnosis not present

## 2022-05-23 DIAGNOSIS — H43813 Vitreous degeneration, bilateral: Secondary | ICD-10-CM

## 2022-06-02 DIAGNOSIS — M25642 Stiffness of left hand, not elsewhere classified: Secondary | ICD-10-CM | POA: Diagnosis not present

## 2022-06-04 ENCOUNTER — Ambulatory Visit: Payer: Medicare Other | Admitting: Clinical

## 2022-06-05 ENCOUNTER — Telehealth: Payer: Self-pay | Admitting: Family Medicine

## 2022-06-05 NOTE — Telephone Encounter (Signed)
Contacted [name] to schedule their annual wellness visit. Call back at later date: 07/28/2022  Gabriel Cirri [facility] TEAM Direct Dial [phone]

## 2022-06-06 ENCOUNTER — Other Ambulatory Visit: Payer: Self-pay | Admitting: *Deleted

## 2022-06-06 DIAGNOSIS — R7989 Other specified abnormal findings of blood chemistry: Secondary | ICD-10-CM

## 2022-06-06 DIAGNOSIS — M25642 Stiffness of left hand, not elsewhere classified: Secondary | ICD-10-CM | POA: Diagnosis not present

## 2022-06-06 DIAGNOSIS — D61818 Other pancytopenia: Secondary | ICD-10-CM

## 2022-06-06 MED ORDER — SIMVASTATIN 40 MG PO TABS
40.0000 mg | ORAL_TABLET | Freq: Every day | ORAL | 1 refills | Status: AC
Start: 2022-06-06 — End: ?

## 2022-06-12 DIAGNOSIS — M25642 Stiffness of left hand, not elsewhere classified: Secondary | ICD-10-CM | POA: Diagnosis not present

## 2022-06-12 DIAGNOSIS — Z4889 Encounter for other specified surgical aftercare: Secondary | ICD-10-CM | POA: Diagnosis not present

## 2022-06-16 ENCOUNTER — Other Ambulatory Visit: Payer: Self-pay | Admitting: Family Medicine

## 2022-06-17 ENCOUNTER — Other Ambulatory Visit: Payer: Self-pay | Admitting: Family Medicine

## 2022-06-17 DIAGNOSIS — M25642 Stiffness of left hand, not elsewhere classified: Secondary | ICD-10-CM | POA: Diagnosis not present

## 2022-06-20 DIAGNOSIS — M25642 Stiffness of left hand, not elsewhere classified: Secondary | ICD-10-CM | POA: Diagnosis not present

## 2022-06-24 DIAGNOSIS — M25642 Stiffness of left hand, not elsewhere classified: Secondary | ICD-10-CM | POA: Diagnosis not present

## 2022-06-25 ENCOUNTER — Encounter: Payer: Self-pay | Admitting: Family Medicine

## 2022-06-25 ENCOUNTER — Ambulatory Visit (INDEPENDENT_AMBULATORY_CARE_PROVIDER_SITE_OTHER): Payer: Medicare Other | Admitting: Family Medicine

## 2022-06-25 VITALS — BP 130/88 | HR 75 | Temp 98.0°F | Resp 16 | Ht 65.0 in | Wt 201.5 lb

## 2022-06-25 DIAGNOSIS — E785 Hyperlipidemia, unspecified: Secondary | ICD-10-CM | POA: Diagnosis not present

## 2022-06-25 DIAGNOSIS — Z79899 Other long term (current) drug therapy: Secondary | ICD-10-CM

## 2022-06-25 DIAGNOSIS — I10 Essential (primary) hypertension: Secondary | ICD-10-CM | POA: Diagnosis not present

## 2022-06-25 DIAGNOSIS — Z7984 Long term (current) use of oral hypoglycemic drugs: Secondary | ICD-10-CM

## 2022-06-25 DIAGNOSIS — E119 Type 2 diabetes mellitus without complications: Secondary | ICD-10-CM

## 2022-06-25 DIAGNOSIS — E1169 Type 2 diabetes mellitus with other specified complication: Secondary | ICD-10-CM | POA: Diagnosis not present

## 2022-06-25 DIAGNOSIS — F411 Generalized anxiety disorder: Secondary | ICD-10-CM

## 2022-06-25 DIAGNOSIS — Z1159 Encounter for screening for other viral diseases: Secondary | ICD-10-CM | POA: Diagnosis not present

## 2022-06-25 LAB — CBC WITH DIFFERENTIAL/PLATELET
Basophils Absolute: 0 10*3/uL (ref 0.0–0.1)
Basophils Relative: 0.1 % (ref 0.0–3.0)
Eosinophils Absolute: 0.1 10*3/uL (ref 0.0–0.7)
Eosinophils Relative: 1.7 % (ref 0.0–5.0)
HCT: 34.5 % — ABNORMAL LOW (ref 36.0–46.0)
Hemoglobin: 11.1 g/dL — ABNORMAL LOW (ref 12.0–15.0)
Lymphocytes Relative: 33.6 % (ref 12.0–46.0)
Lymphs Abs: 1.3 10*3/uL (ref 0.7–4.0)
MCHC: 32.2 g/dL (ref 30.0–36.0)
MCV: 95.4 fl (ref 78.0–100.0)
Monocytes Absolute: 0.3 10*3/uL (ref 0.1–1.0)
Monocytes Relative: 8.4 % (ref 3.0–12.0)
Neutro Abs: 2.2 10*3/uL (ref 1.4–7.7)
Neutrophils Relative %: 56.2 % (ref 43.0–77.0)
Platelets: 160 10*3/uL (ref 150.0–400.0)
RBC: 3.62 Mil/uL — ABNORMAL LOW (ref 3.87–5.11)
RDW: 15.6 % — ABNORMAL HIGH (ref 11.5–15.5)
WBC: 3.9 10*3/uL — ABNORMAL LOW (ref 4.0–10.5)

## 2022-06-25 LAB — COMPREHENSIVE METABOLIC PANEL
ALT: 23 U/L (ref 0–35)
AST: 20 U/L (ref 0–37)
Albumin: 3.8 g/dL (ref 3.5–5.2)
Alkaline Phosphatase: 93 U/L (ref 39–117)
BUN: 20 mg/dL (ref 6–23)
CO2: 28 mEq/L (ref 19–32)
Calcium: 9.2 mg/dL (ref 8.4–10.5)
Chloride: 105 mEq/L (ref 96–112)
Creatinine, Ser: 0.87 mg/dL (ref 0.40–1.20)
GFR: 65.05 mL/min (ref >60.00)
Glucose, Bld: 135 mg/dL — ABNORMAL HIGH (ref 70–99)
Potassium: 5 mEq/L (ref 3.5–5.1)
Sodium: 140 mEq/L (ref 135–145)
Total Bilirubin: 0.5 mg/dL (ref 0.2–1.2)
Total Protein: 6.5 g/dL (ref 6.0–8.3)

## 2022-06-25 LAB — VITAMIN B12: Vitamin B-12: 216 pg/mL (ref 211–911)

## 2022-06-25 LAB — LIPID PANEL
Cholesterol: 162 mg/dL (ref 0–200)
HDL: 69.6 mg/dL (ref >39.00)
LDL Cholesterol: 70 mg/dL (ref 0–99)
NonHDL: 92.13
Total CHOL/HDL Ratio: 2
Triglycerides: 109 mg/dL (ref 0.0–149.0)
VLDL: 21.8 mg/dL (ref 0.0–40.0)

## 2022-06-25 LAB — HEMOGLOBIN A1C: Hgb A1c MFr Bld: 6.8 % — ABNORMAL HIGH (ref 4.6–6.5)

## 2022-06-25 LAB — TSH: TSH: 2.93 u[IU]/mL (ref 0.35–5.50)

## 2022-06-25 MED ORDER — FREESTYLE LIBRE 2 SENSOR MISC
1 refills | Status: DC
Start: 2022-06-25 — End: 2022-07-08

## 2022-06-25 NOTE — Assessment & Plan Note (Signed)
Chronic.  Fair control  continue Wellbutrin xl 150 mg daily, prazosin 1 mg at night-time, Effexor XR 150 mg daily.  Just started counseling.  Will refer to psychiatry for medications adjustments

## 2022-06-25 NOTE — Assessment & Plan Note (Signed)
Chronic.  Controlled.  Continue cozaar 50, metoprolol 50 mg daily.

## 2022-06-25 NOTE — Assessment & Plan Note (Signed)
Chronic.  Controlled. Continue zocor 40 mg daily

## 2022-06-25 NOTE — Assessment & Plan Note (Addendum)
Diabetes type 2-chronic.  Controlled.  Continue metformin 500 mg in the a.m., 1000 mg in the p.m.  Patient is on a statin and ARB. Will get copy eye exam.  Checks sugars several ties/day with librye

## 2022-06-25 NOTE — Patient Instructions (Addendum)
It was very nice to see you today!  Schedule appointment with Dermatologist.   Schedule mammogram.    I'm referring to psychiatrist   PLEASE NOTE:  If you had any lab tests please let us know if you have not heard back within a few days. You may see your results on MyChart before we have a chance to review them but we will give you a call once they are reviewed by [place]. If we ordered any referrals today, please let us know if you have not heard from their office within the next week.   Please try these tips to maintain a healthy lifestyle:  Eat most of your calories during the day when you are active. Eliminate processed foods including packaged sweets (pies, cakes, cookies), reduce intake of potatoes, white bread, white pasta, and white rice. Look for whole grain options, oat flour or almond flour.  Each meal should contain half fruits/vegetables, one quarter protein, and one quarter carbs (no bigger than a computer mouse).  Cut down on sweet beverages. This includes juice, soda, and sweet tea. Also watch fruit intake, though this is a healthier sweet option, it still contains natural sugar! Limit to 3 servings daily.  Drink at least 1 glass of water with each meal and aim for at least 8 glasses per day  Exercise at least 150 minutes every week.

## 2022-06-25 NOTE — Progress Notes (Signed)
Subjective:     Patient ID: [name], female    DOB: [birth year], 77 y.o.   MRN: [id number]  Chief Complaint  Patient presents with   Medical Management of Chronic Issues    6 month follow-up on dm, htn     HPI  DM type 2-on metformin 500 mg am and 1000pm.  Sugars 130's before meals or 2 hrs after.  HTN-Pt is on cozaar 50, metoprolol 50.  Bp's running  -no checking  No ha/dizziness/cp/palp/edema/cough/sob  HLD-simvastatin 40 mg Generalized anxiety disorder/adjustment disorder. Has started counseling Jenna Mendelson. Medications holding for most part.   But Not improving. No SI.  Chronic urinary tract infection-was on keflex daily.  Ran out and taking azo and ok.  Face-bumps worse.  Retin a-irrit skin worse, then fever blister.  Macular degeneration-ophth monthly  Health Maintenance Due  Topic Date Due   Hepatitis C Screening  Never done   OPHTHALMOLOGY EXAM  12/26/2021   FOOT EXAM  05/28/2022    Past Medical History:  Diagnosis Date   Anxiety    Arthritis    Blood transfusion without reported diagnosis    Cataract    Cough 01/11/2014   Depression    Diabetes type 2, controlled (HCC)    Esophageal reflux disease    Hypercholesterolemia    Hypertension    Ischemic colitis (HCC) 01/28/2007   Liver enzyme elevation    Lung nodule 06/11/2012   Macular degeneration, wet (HCC)    Multiple myeloma (HCC) 05/27/2012   Cytogenetics on 06/08/2012 was normal 46, XX [20]   Osteoporosis    Other fatigue 02/08/2014   Oxygen deficiency    Pancytopenia (HCC)    Pneumonia    Post-nasal drip 01/11/2014   Sepsis (HCC)    Sleep apnea    does not use CPAP   Tuberculosis     (+ TB TEST)YEARS AGO POSSIBLE EXPOSURE WAS MED TX    Type 2 diabetes mellitus without complications (HCC) 01/31/2014    Past Surgical History:  Procedure Laterality Date   APPENDECTOMY  1998   COLONOSCOPY  01/28/2011   poly; Dr. Randa Evens, next one due 2018.l   LEG SURGERY     OPEN REDUCTION INTERNAL  FIXATION (ORIF) HAND Left 04/29/2022   Procedure: OPEN REDUCTION INTERNAL FIXATION with percutaneous pinning, left ring finger proximal phalanx;  Surgeon: Gomez Cleverly, MD;  Location: [facility];  Service: Orthopedics;  Laterality: Left;  mac and regional   Stem cell  2015   TONSILLECTOMY     VIDEO BRONCHOSCOPY WITH ENDOBRONCHIAL NAVIGATION Right 07/08/2012   Procedure: VIDEO BRONCHOSCOPY WITH ENDOBRONCHIAL NAVIGATION;  Surgeon: Leslye Peer, MD;  Location: [facility] OR;  Service: Thoracic;  Laterality: Right;     Current Outpatient Medications:    buPROPion (WELLBUTRIN XL) 150 MG 24 hr tablet, Take 150 mg by mouth every morning., Disp: , Rfl:    cholecalciferol (VITAMIN D) 1000 UNITS tablet, Take 2,000 Units by mouth daily., Disp: , Rfl:    Continuous Blood Gluc Receiver (FREESTYLE LIBRE 2 READER) DEVI, Use to test blood sugar, Disp: 1 each, Rfl: 1   ketoconazole (NIZORAL) 2 % cream, Apply 1 Application topically 2 (two) times daily., Disp: 60 g, Rfl: 1   losartan (COZAAR) 50 MG tablet, Take 50 mg by mouth daily., Disp: , Rfl:    magnesium chloride (SLOW-MAG) 64 MG TBEC SR tablet, Take 1 tablet by mouth 2 (two) times daily. , Disp: , Rfl:    metFORMIN (GLUCOPHAGE)  500 MG tablet, Take 1 in the morning and 2 in the evening by mouth, Disp: 270 tablet, Rfl: 1   metoprolol succinate (TOPROL-XL) 50 MG 24 hr tablet, Take 50 mg by mouth daily. Take with or immediately following a meal., Disp: , Rfl:    Multiple Vitamins-Minerals (MULTIVITAMIN WITH MINERALS) tablet, Take 1 tablet by mouth at bedtime. , Disp: , Rfl:    Multiple Vitamins-Minerals (PRESERVISION AREDS 2) CAPS, Take 1 capsule by mouth 2 (two) times daily., Disp: , Rfl:    prazosin (MINIPRESS) 1 MG capsule, TAKE 1 CAPSULE BY MOUTH AT BEDTIME., Disp: 90 capsule, Rfl: 1   simvastatin (ZOCOR) 40 MG tablet, Take 1 tablet (40 mg total) by mouth at bedtime., Disp: 90 tablet, Rfl: 1   trimethoprim-polymyxin b (POLYTRIM) ophthalmic  solution, Place into both eyes., Disp: , Rfl:    venlafaxine XR (EFFEXOR-XR) 150 MG 24 hr capsule, TAKE 1 CAPSULE BY MOUTH EVERY DAY WITH FOOD FOR 30 DAYS, Disp: 90 capsule, Rfl: 1   Continuous Glucose Sensor (FREESTYLE LIBRE 2 SENSOR) MISC, Use to test blood sugar, Disp: 3 each, Rfl: 1  No Known Allergies ROS neg/noncontributory except as noted HPI/below      Objective:     BP 130/88   Pulse 75   Temp 98 F (36.7 C) (Temporal)   Resp 16   Ht 5\' 5"  (1.651 m)   Wt 201 lb 8 oz (91.4 kg)   SpO2 98%   BMI 33.53 kg/m  Wt Readings from Last 3 Encounters:  06/25/22 201 lb 8 oz (91.4 kg)  04/29/22 197 lb 5 oz (89.5 kg)  03/31/22 201 lb (91.2 kg)    Physical Exam   Gen: WDWN NAD HEENT: NCAT, conjunctiva not injected, sclera nonicteric NECK:  supple, no thyromegaly, no nodes, no carotid bruits CARDIAC: RRR, S1S2+, 1/6 murmur. DP 2+B LUNGS: CTAB. No wheezes ABDOMEN:  BS+, soft, NTND, No HSM, no masses EXT:  no edema MSK: no gross abnormalities.  NEURO: A&O x3.  CN II-XII intact.  PSYCH: normal mood. Good eye contact  Spent w/patient getting updates, listening to concerns, devising plan. Reviewing records     Assessment & Plan:  Type 2 diabetes mellitus without complication, without long-term current use of insulin (HCC) Assessment & Plan: Diabetes type 2-chronic.  Controlled.  Continue metformin 500 mg in the a.m., 1000 mg in the p.m.  Patient is on a statin and ARB. Will get copy eye exam.  Checks sugars several ties/day with librye  Orders: -     FreeStyle Libre 2 Sensor; Use to test blood sugar  Dispense: 3 each; Refill: 1 -     Comprehensive metabolic panel -     Hemoglobin A1c -     TSH  Essential hypertension Assessment & Plan: Chronic.  Controlled.  Continue cozaar 50, metoprolol 50 mg daily.   Orders: -     Comprehensive metabolic panel -     CBC with Differential/Platelet  Hyperlipidemia associated with type 2 diabetes mellitus (HCC) Assessment  & Plan: Chronic.  Controlled.  Continue zocor 40 mg daily  Orders: -     Comprehensive metabolic panel -     Lipid panel  GAD (generalized anxiety disorder) Assessment & Plan: Chronic.  Fair control  continue Wellbutrin xl 150 mg daily, prazosin 1 mg at night-time, Effexor XR 150 mg daily.  Just started counseling.  Will refer to psychiatry for medications adjustments  Orders: -     Ambulatory referral to Psychiatry  Long term current use of oral hypoglycemic drug  High risk medication use -     Vitamin B12  Screening for viral disease -     Hepatitis C antibody  Follow up 6 mo  Angelena Sole, MD

## 2022-06-25 NOTE — Assessment & Plan Note (Signed)
Chronic.  Controlled. Continue zocor 40 mg daily 

## 2022-06-26 ENCOUNTER — Other Ambulatory Visit: Payer: Self-pay | Admitting: Hematology and Oncology

## 2022-06-26 DIAGNOSIS — C9001 Multiple myeloma in remission: Secondary | ICD-10-CM

## 2022-06-26 LAB — HEPATITIS C ANTIBODY: Hepatitis C Ab: NONREACTIVE

## 2022-06-26 NOTE — Progress Notes (Signed)
Copy of labs forwarded to Dr. Bertis Ruddy as well Labs are at goal for diabetes.  Hemoglobin a little lower than usual-defer to oncology  B12 is low-take B12 vitamin OTC (available over the counter without a prescription) 2010mcg/day

## 2022-06-27 ENCOUNTER — Encounter (INDEPENDENT_AMBULATORY_CARE_PROVIDER_SITE_OTHER): Payer: Medicare Other | Admitting: Ophthalmology

## 2022-06-27 DIAGNOSIS — H43813 Vitreous degeneration, bilateral: Secondary | ICD-10-CM

## 2022-06-27 DIAGNOSIS — H35033 Hypertensive retinopathy, bilateral: Secondary | ICD-10-CM

## 2022-06-27 DIAGNOSIS — I10 Essential (primary) hypertension: Secondary | ICD-10-CM

## 2022-06-27 DIAGNOSIS — H353231 Exudative age-related macular degeneration, bilateral, with active choroidal neovascularization: Secondary | ICD-10-CM | POA: Diagnosis not present

## 2022-07-08 ENCOUNTER — Ambulatory Visit (INDEPENDENT_AMBULATORY_CARE_PROVIDER_SITE_OTHER): Payer: Medicare Other

## 2022-07-08 VITALS — Wt 201.0 lb

## 2022-07-08 DIAGNOSIS — M25642 Stiffness of left hand, not elsewhere classified: Secondary | ICD-10-CM | POA: Diagnosis not present

## 2022-07-08 DIAGNOSIS — Z0000 Encounter for general adult medical examination without abnormal findings: Secondary | ICD-10-CM | POA: Diagnosis not present

## 2022-07-08 NOTE — Progress Notes (Signed)
I connected with  [name] on 07/08/22 by a audio enabled telemedicine application and verified that I am speaking with the correct person using two identifiers.  Patient Location: Home  Provider Location: Office/Clinic  I discussed the limitations of evaluation and management by telemedicine. The patient expressed understanding and agreed to proceed.   Subjective:   [name] is a 77 y.o. female who presents for Medicare Annual (Subsequent) preventive examination.  Review of Systems     Cardiac Risk Factors include: advanced age (>80men, >50 women);diabetes mellitus;hypertension;dyslipidemia     Objective:    Today's Vitals   07/08/22 1116  Weight: 201 lb (91.2 kg)   Body mass index is 33.45 kg/m.     07/08/2022   11:23 AM 04/29/2022    8:46 AM 04/23/2022    2:22 PM 03/24/2022    5:57 PM 10/29/2015    2:31 PM 07/17/2015    1:44 PM 04/10/2015   12:48 PM  Advanced Directives  Does Patient Have a Medical Advance Directive? Yes Yes Yes No Yes Yes Yes  Type of Estate agent of [place];Living will Healthcare Power of [place];Living will Healthcare Power of [place];Living will  Healthcare Power of [place];Living will Healthcare Power of [place];Living will Healthcare Power of [place];Living will  Does patient want to make changes to medical advance directive?  No - Guardian declined   No - Patient declined No - Patient declined No - Patient declined  Copy of Healthcare Power of Attorney in Chart? No - copy requested No - copy requested   No - copy requested Yes Yes  Would patient like information on creating a medical advance directive?    No - Patient declined       Current Medications (verified) Outpatient Encounter Medications as of 07/08/2022  Medication Sig   buPROPion (WELLBUTRIN XL) 150 MG 24 hr tablet Take 150 mg by mouth every morning.   cholecalciferol (VITAMIN D) 1000 UNITS tablet Take 2,000 Units by mouth daily.   Continuous Blood Gluc  Receiver (FREESTYLE LIBRE 2 READER) DEVI Use to test blood sugar   losartan (COZAAR) 50 MG tablet Take 50 mg by mouth daily.   magnesium chloride (SLOW-MAG) 64 MG TBEC SR tablet Take 1 tablet by mouth 2 (two) times daily.    metFORMIN (GLUCOPHAGE) 500 MG tablet Take 1 in the morning and 2 in the evening by mouth   metoprolol succinate (TOPROL-XL) 50 MG 24 hr tablet Take 50 mg by mouth daily. Take with or immediately following a meal.   Multiple Vitamins-Minerals (MULTIVITAMIN WITH MINERALS) tablet Take 1 tablet by mouth at bedtime.    Multiple Vitamins-Minerals (PRESERVISION AREDS 2) CAPS Take 1 capsule by mouth 2 (two) times daily.   prazosin (MINIPRESS) 1 MG capsule TAKE 1 CAPSULE BY MOUTH AT BEDTIME.   simvastatin (ZOCOR) 40 MG tablet Take 1 tablet (40 mg total) by mouth at bedtime.   trimethoprim-polymyxin b (POLYTRIM) ophthalmic solution Place into both eyes.   venlafaxine XR (EFFEXOR-XR) 150 MG 24 hr capsule TAKE 1 CAPSULE BY MOUTH EVERY DAY WITH FOOD FOR 30 DAYS   [DISCONTINUED] Continuous Glucose Sensor (FREESTYLE LIBRE 2 SENSOR) MISC Use to test blood sugar   [DISCONTINUED] ketoconazole (NIZORAL) 2 % cream Apply 1 Application topically 2 (two) times daily.   No facility-administered encounter medications on file as of 07/08/2022.    Allergies (verified) Patient has no known allergies.   History: Past Medical History:  Diagnosis Date   Anxiety    Arthritis  Blood transfusion without reported diagnosis    Cataract    Cough 01/11/2014   Depression    Diabetes type 2, controlled (HCC)    Esophageal reflux disease    Hypercholesterolemia    Hypertension    Ischemic colitis (HCC) 01/28/2007   Liver enzyme elevation    Lung nodule 06/11/2012   Macular degeneration, wet (HCC)    Multiple myeloma (HCC) 05/27/2012   Cytogenetics on 06/08/2012 was normal 46, XX [20]   Osteoporosis    Other fatigue 02/08/2014   Oxygen deficiency    Pancytopenia (HCC)    Pneumonia     Post-nasal drip 01/11/2014   Sepsis (HCC)    Sleep apnea    does not use CPAP   Tuberculosis     (+ TB TEST)YEARS AGO POSSIBLE EXPOSURE WAS MED TX    Type 2 diabetes mellitus without complications (HCC) 01/31/2014   Past Surgical History:  Procedure Laterality Date   APPENDECTOMY  1998   COLONOSCOPY  01/28/2011   poly; Dr. Randa Evens, next one due 2018.l   LEG SURGERY     OPEN REDUCTION INTERNAL FIXATION (ORIF) HAND Left 04/29/2022   Procedure: OPEN REDUCTION INTERNAL FIXATION with percutaneous pinning, left ring finger proximal phalanx;  Surgeon: Gomez Cleverly, MD;  Location: [facility];  Service: Orthopedics;  Laterality: Left;  mac and regional   Stem cell  2015   TONSILLECTOMY     VIDEO BRONCHOSCOPY WITH ENDOBRONCHIAL NAVIGATION Right 07/08/2012   Procedure: VIDEO BRONCHOSCOPY WITH ENDOBRONCHIAL NAVIGATION;  Surgeon: Leslye Peer, MD;  Location: [facility] OR;  Service: Thoracic;  Laterality: Right;   Family History  Problem Relation Age of Onset   Stroke Mother    Hearing loss Mother    Diabetes Mother    Anxiety disorder Mother    Alcohol abuse Mother    Early death Father    Depression Father    Alcohol abuse Father    Heart attack Father    Early death Sister    Depression Sister    Alcohol abuse Sister    Cancer Sister        synovial sarcoma   Hypertension Brother    Hyperlipidemia Brother    Stroke Brother    Depression Brother    Alcohol abuse Brother    Cancer Maternal Grandfather        ? cancer   Social History   Socioeconomic History   Marital status: Divorced    Spouse name: Not on file   Number of children: 2   Years of education: Not on file   Highest education level: Not on file  Occupational History   Occupation: retired    Comment: adm. asts  Tobacco Use   Smoking status: Former    Packs/day: 1.00    Years: 30.00    Additional pack years: 0.00    Total pack years: 30.00    Types: Cigarettes    Quit date: 01/27/2009    Years since  quitting: 13.4   Smokeless tobacco: Never  Vaping Use   Vaping Use: Never used  Substance and Sexual Activity   Alcohol use: Yes    Comment: drinks daily wine or mixed drink daily   Drug use: No   Sexual activity: Not Currently    Birth control/protection: Post-menopausal  Other Topics Concern   Not on file  Social History Narrative   Lives alone   Social Determinants of Health   Financial Resource Strain: Low Risk  (07/08/2022)  Overall Financial Resource Strain (CARDIA)    Difficulty of Paying Living Expenses: Not hard at all  Food Insecurity: No Food Insecurity (07/08/2022)   Hunger Vital Sign    Worried About Running Out of Food in the Last Year: Never true    Ran Out of Food in the Last Year: Never true  Transportation Needs: No Transportation Needs (07/08/2022)   PRAPARE - Administrator, Civil Service (Medical): No    Lack of Transportation (Non-Medical): No  Physical Activity: Inactive (07/08/2022)   Exercise Vital Sign    Days of Exercise per Week: 0 days    Minutes of Exercise per Session: 0 min  Stress: Stress Concern Present (07/08/2022)   [employer] of Occupational Health - Occupational Stress Questionnaire    Feeling of Stress : To some extent  Social Connections: Socially Isolated (07/08/2022)   Social Connection and Isolation Panel [NHANES]    Frequency of Communication with Friends and Family: More than three times a week    Frequency of Social Gatherings with Friends and Family: More than three times a week    Attends Religious Services: Never    Database administrator or Organizations: No    Attends Engineer, structural: Never    Marital Status: Divorced    Tobacco Counseling Counseling given: Not Answered   Clinical Intake:  Pre-visit preparation completed: Yes  Pain : No/denies pain     BMI - recorded: 33.45 Nutritional Status: BMI > 30  Obese Nutritional Risks: None Diabetes: Yes  How often do you need to have  someone help you when you read instructions, pamphlets, or other written materials from your doctor or pharmacy?: 1 - Never  Diabetic?Nutrition Risk Assessment:  Has the patient had any N/V/D within the last 2 months?  No  Does the patient have any non-healing wounds?  No  Has the patient had any unintentional weight loss or weight gain?  No   Diabetes:  Is the patient diabetic?  Yes  If diabetic, was a CBG obtained today?  No  Did the patient bring in their glucometer from home?  No  How often do you monitor your CBG's? N/a.   Financial Strains and Diabetes Management:  Are you having any financial strains with the device, your supplies or your medication? No .  Does the patient want to be seen by Chronic Care Management for management of their diabetes?  No  Would the patient like to be referred to a Nutritionist or for Diabetic Management?  No   Diabetic Exams:  Diabetic Eye Exam: Overdue for diabetic eye exam. Pt has been advised about the importance in completing this exam. Patient advised to call and schedule an eye exam. Diabetic Foot Exam: Overdue, Pt has been advised about the importance in completing this exam. Pt is scheduled for diabetic foot exam on next appt .   Interpreter Needed?: No  Information entered by :: Lanier Ensign, LPN   Activities of Daily Living    07/08/2022   11:25 AM 04/29/2022    8:51 AM  In your present state of health, do you have any difficulty performing the following activities:  Hearing? 1 0  Comment HOH   Vision? 0 0  Difficulty concentrating or making decisions? 0 0  Walking or climbing stairs? 0 0  Dressing or bathing? 0 0  Doing errands, shopping? 0   Preparing Food and eating ? N   Using the Toilet? N   In the  past six months, have you accidently leaked urine? Y   Comment wears a pad   Do you have problems with loss of bowel control? N   Managing your Medications? N   Managing your Finances? N   Housekeeping or managing your  Housekeeping? N     Patient Care Team: Jeani Sow, MD as PCP - General (Family Medicine) Carman Ching, MD (Inactive) as Attending Physician (Gastroenterology) Osborn Coho, MD (Inactive) as Attending Physician (Otolaryngology) Leslye Peer, MD as Attending Physician (Pulmonary Disease) McIver, Janna Arch, DO as Consulting Physician (Hematology)  Indicate any recent Medical Services you may have received from other than [facility] providers in the past year (date may be approximate).     Assessment:   This is a routine wellness examination for [facility].  Hearing/Vision screen Hearing Screening - Comments:: Pt HOH  Vision Screening - Comments:: Pt follows up with Dr Ashley Royalty for annual eye exams   Dietary issues and exercise activities discussed: Current Exercise Habits: The patient does not participate in regular exercise at present   Goals Addressed             This Visit's Progress    Patient Stated       Lose weight and exercise        Depression Screen    07/08/2022   11:19 AM 06/25/2022   11:37 AM 09/24/2021   11:38 AM  PHQ 2/9 Scores  PHQ - 2 Score 4 4 4   PHQ- 9 Score 6 14 14     Fall Risk    07/08/2022   11:24 AM 06/25/2022   11:35 AM 01/30/2022    8:18 AM 09/24/2021   11:36 AM 10/04/2015    4:56 PM  Fall Risk   Falls in the past year? 1 1 1 1  No  Comment     Emmi Telephone Survey: data to providers prior to load  Number falls in past yr: 1 0 0 0   Injury with Fall? 1 1 1  0   Comment hurt left  hand      Risk for fall due to : Impaired balance/gait;Impaired vision Impaired balance/gait Other (Comment) Impaired balance/gait   Risk for fall due to: Comment   missed step and fell    Follow up Falls prevention discussed Falls prevention discussed Falls prevention discussed;Education provided Education provided;Falls prevention discussed     FALL RISK PREVENTION PERTAINING TO THE HOME:  Any stairs in or around the home? Yes  If so, are there any  without handrails? No  Home free of loose throw rugs in walkways, pet beds, electrical cords, etc? Yes  Adequate lighting in your home to reduce risk of falls? Yes   ASSISTIVE DEVICES UTILIZED TO PREVENT FALLS:  Life alert? No  Use of a cane, walker or w/c? No  Grab bars in the bathroom? Yes  Shower chair or bench in shower? No  Elevated toilet seat or a handicapped toilet? No   TIMED UP AND GO:  Was the test performed? No .   Cognitive Function:        07/08/2022   11:26 AM  6CIT Screen  What Year? 0 points  What month? 0 points  What time? 0 points  Count back from 20 0 points  Months in reverse 0 points  Repeat phrase 0 points  Total Score 0 points    Immunizations Immunization History  Administered Date(s) Administered   DTaP 08/12/2013, 11/11/2013, 02/14/2014   Fluad Quad(high Dose 65+) 10/26/2018  HIB (PRP-T) 11/11/2013, 02/14/2014, 06/28/2014   Hepatitis B, ADULT 06/28/2014   Hepatitis B, PED/ADOLESCENT 11/11/2013, 02/14/2014, 06/28/2014   IPV 08/12/2013, 11/11/2013, 02/14/2014   Influenza Split 09/28/2011   Influenza, High Dose Seasonal PF 11/20/2016, 12/10/2017, 10/26/2018   Influenza,inj,Quad PF,6+ Mos 11/15/2012, 10/23/2014, 10/29/2015   Influenza-Unspecified 09/28/2011, 11/15/2012, 10/23/2014, 10/29/2015, 11/20/2016, 11/11/2021   MMR 08/23/2019   Moderna Sars-Covid-2 Vaccination 12/12/2021   PFIZER Comirnaty(Gray Top)Covid-19 Tri-Sucrose Vaccine 08/14/2020   PFIZER(Purple Top)SARS-COV-2 Vaccination 02/15/2019, 03/07/2019, 10/04/2019   Pneumococcal Conjugate-13 08/12/2013, 11/11/2013, 02/14/2014, 02/20/2017   Zoster Recombinat (Shingrix) 08/05/2017, 03/20/2018   Zoster, Unspecified 03/20/2018    TDAP status: Up to date  Flu Vaccine status: Up to date  Pneumococcal vaccine status: Up to date  Covid-19 vaccine status: Information provided on how to obtain vaccines.   Qualifies for Shingles Vaccine? Yes   Zostavax completed Yes   Shingrix  Completed?: Yes  Screening Tests Health Maintenance  Topic Date Due   OPHTHALMOLOGY EXAM  12/26/2021   FOOT EXAM  05/28/2022   Pneumonia Vaccine 61+ Years old (2 of 2 - PPSV23 or PCV20) 09/08/2022 (Originally 04/17/2017)   COVID-19 Vaccine (6 - 2023-24 season) 09/09/2022 (Originally 02/06/2022)   INFLUENZA VACCINE  08/28/2022   HEMOGLOBIN A1C  12/26/2022   DTaP/Tdap/Td (4 - Tdap) 02/15/2024   Colonoscopy  08/11/2027   DEXA SCAN  Completed   Hepatitis C Screening  Completed   Zoster Vaccines- Shingrix  Completed   HPV VACCINES  Aged Out    Health Maintenance  Health Maintenance Due  Topic Date Due   OPHTHALMOLOGY EXAM  12/26/2021   FOOT EXAM  05/28/2022     Colorectal cancer screening: Type of screening: Colonoscopy. Completed 08/10/17. Repeat every 10 years  Mammogram status: Completed 02/21/21. Repeat every year  Bone Density status: Completed 12/25/20. Results reflect: Bone density results: OSTEOPOROSIS. Repeat every 2 years.   Additional Screening:  Hepatitis C Screening: Completed 06/25/22  Vision Screening: Recommended annual ophthalmology exams for early detection of glaucoma and other disorders of the eye. Is the patient up to date with their annual eye exam?  Yes per pt  Who is the provider or what is the name of the office in which the patient attends annual eye exams? Dr Ashley Royalty  If pt is not established with a provider, would they like to be referred to a provider to establish care? No .   Dental Screening: Recommended annual dental exams for proper oral hygiene  Community Resource Referral / Chronic Care Management: CRR required this visit?  No   CCM required this visit?  No      Plan:     I have personally reviewed and noted the following in the patient's chart:   Medical and social history Use of alcohol, tobacco or illicit drugs  Current medications and supplements including opioid prescriptions. Patient is not currently taking opioid  prescriptions. Functional ability and status Nutritional status Physical activity Advanced directives List of other physicians Hospitalizations, surgeries, and ER visits in previous 12 months Vitals Screenings to include cognitive, depression, and falls Referrals and appointments  In addition, I have reviewed and discussed with patient certain preventive protocols, quality metrics, and best practice recommendations. A written personalized care plan for preventive services as well as general preventive health recommendations were provided to patient.     Marzella Schlein, LPN   [id number]   Nurse Notes: none

## 2022-07-08 NOTE — Patient Instructions (Signed)
[name] , Thank you for taking time to come for your Medicare Wellness Visit. I appreciate your ongoing commitment to your health goals. Please review the following plan we discussed and let me know if I can assist you in the future.   These are the goals we discussed:  Goals   None     This is a list of the screening recommended for you and due dates:  Health Maintenance  Topic Date Due   Eye exam for diabetics  12/26/2021   Complete foot exam   05/28/2022   Pneumonia Vaccine (2 of 2 - PPSV23 or PCV20) 09/08/2022*   COVID-19 Vaccine (6 - 2023-24 season) 09/09/2022*   Flu Shot  08/28/2022   Hemoglobin A1C  12/26/2022   DTaP/Tdap/Td vaccine (4 - Tdap) 02/15/2024   Colon Cancer Screening  08/11/2027   DEXA scan (bone density measurement)  Completed   Hepatitis C Screening  Completed   Zoster (Shingles) Vaccine  Completed   HPV Vaccine  Aged Out  *Topic was postponed. The date shown is not the original due date.    Advanced directives: Please bring a copy of your health care power of attorney and living will to the office at your convenience.  Conditions/risks identified: lose weight and exercise   Next appointment: Follow up in one year for your annual wellness visit    Preventive Care 65 Years and Older, Female Preventive care refers to lifestyle choices and visits with your health care provider that can promote health and wellness. What does preventive care include? A yearly physical exam. This is also called an annual well check. Dental exams once or twice a year. Routine eye exams. Ask your health care provider how often you should have your eyes checked. Personal lifestyle choices, including: Daily care of your teeth and gums. Regular physical activity. Eating a healthy diet. Avoiding tobacco and drug use. Limiting alcohol use. Practicing safe sex. Taking low-dose aspirin every day. Taking vitamin and mineral supplements as recommended by your health care  provider. What happens during an annual well check? The services and screenings done by your health care provider during your annual well check will depend on your age, overall health, lifestyle risk factors, and family history of disease. Counseling  Your health care provider may ask you questions about your: Alcohol use. Tobacco use. Drug use. Emotional well-being. Home and relationship well-being. Sexual activity. Eating habits. History of falls. Memory and ability to understand (cognition). Work and work Astronomer. Reproductive health. Screening  You may have the following tests or measurements: Height, weight, and BMI. Blood pressure. Lipid and cholesterol levels. These may be checked every 5 years, or more frequently if you are over 69 years old. Skin check. Lung cancer screening. You may have this screening every year starting at age 32 if you have a 30-pack-year history of smoking and currently smoke or have quit within the past 15 years. Fecal occult blood test (FOBT) of the stool. You may have this test every year starting at age 85. Flexible sigmoidoscopy or colonoscopy. You may have a sigmoidoscopy every 5 years or a colonoscopy every 10 years starting at age 34. Hepatitis C blood test. Hepatitis B blood test. Sexually transmitted disease (STD) testing. Diabetes screening. This is done by checking your blood sugar (glucose) after you have not eaten for a while (fasting). You may have this done every 1-3 years. Bone density scan. This is done to screen for osteoporosis. You may have this done starting at  age 108. Mammogram. This may be done every 1-2 years. Talk to your health care provider about how often you should have regular mammograms. Talk with your health care provider about your test results, treatment options, and if necessary, the need for more tests. Vaccines  Your health care provider may recommend certain vaccines, such as: Influenza vaccine. This is  recommended every year. Tetanus, diphtheria, and acellular pertussis (Tdap, Td) vaccine. You may need a Td booster every 10 years. Zoster vaccine. You may need this after age 50. Pneumococcal 13-valent conjugate (PCV13) vaccine. One dose is recommended after age 66. Pneumococcal polysaccharide (PPSV23) vaccine. One dose is recommended after age 45. Talk to your health care provider about which screenings and vaccines you need and how often you need them. This information is not intended to replace advice given to you by your health care provider. Make sure you discuss any questions you have with your health care provider. Document Released: 02/09/2015 Document Revised: 10/03/2015 Document Reviewed: 11/14/2014 Elsevier Interactive Patient Education  2017 [employer] Prevention in the Home Falls can cause injuries. They can happen to people of all ages. There are many things you can do to make your home safe and to help prevent falls. What can I do on the outside of my home? Regularly fix the edges of walkways and driveways and fix any cracks. Remove anything that might make you trip as you walk through a door, such as a raised step or threshold. Trim any bushes or trees on the path to your home. Use bright outdoor lighting. Clear any walking paths of anything that might make someone trip, such as rocks or tools. Regularly check to see if handrails are loose or broken. Make sure that both sides of any steps have handrails. Any raised decks and porches should have guardrails on the edges. Have any leaves, snow, or ice cleared regularly. Use sand or salt on walking paths during winter. Clean up any spills in your garage right away. This includes oil or grease spills. What can I do in the bathroom? Use night lights. Install grab bars by the toilet and in the tub and shower. Do not use towel bars as grab bars. Use non-skid mats or decals in the tub or shower. If you need to sit down in  the shower, use a plastic, non-slip stool. Keep the floor dry. Clean up any water that spills on the floor as soon as it happens. Remove soap buildup in the tub or shower regularly. Attach bath mats securely with double-sided non-slip rug tape. Do not have throw rugs and other things on the floor that can make you trip. What can I do in the bedroom? Use night lights. Make sure that you have a light by your bed that is easy to reach. Do not use any sheets or blankets that are too big for your bed. They should not hang down onto the floor. Have a firm chair that has side arms. You can use this for support while you get dressed. Do not have throw rugs and other things on the floor that can make you trip. What can I do in the kitchen? Clean up any spills right away. Avoid walking on wet floors. Keep items that you use a lot in easy-to-reach places. If you need to reach something above you, use a strong step stool that has a grab bar. Keep electrical cords out of the way. Do not use floor polish or wax that makes floors  slippery. If you must use wax, use non-skid floor wax. Do not have throw rugs and other things on the floor that can make you trip. What can I do with my stairs? Do not leave any items on the stairs. Make sure that there are handrails on both sides of the stairs and use them. Fix handrails that are broken or loose. Make sure that handrails are as long as the stairways. Check any carpeting to make sure that it is firmly attached to the stairs. Fix any carpet that is loose or worn. Avoid having throw rugs at the top or bottom of the stairs. If you do have throw rugs, attach them to the floor with carpet tape. Make sure that you have a light switch at the top of the stairs and the bottom of the stairs. If you do not have them, ask someone to add them for you. What else can I do to help prevent falls? Wear shoes that: Do not have high heels. Have rubber bottoms. Are comfortable  and fit you well. Are closed at the toe. Do not wear sandals. If you use a stepladder: Make sure that it is fully opened. Do not climb a closed stepladder. Make sure that both sides of the stepladder are locked into place. Ask someone to hold it for you, if possible. Clearly mark and make sure that you can see: Any grab bars or handrails. First and last steps. Where the edge of each step is. Use tools that help you move around (mobility aids) if they are needed. These include: Canes. Walkers. Scooters. Crutches. Turn on the lights when you go into a dark area. Replace any light bulbs as soon as they burn out. Set up your furniture so you have a clear path. Avoid moving your furniture around. If any of your floors are uneven, fix them. If there are any pets around you, be aware of where they are. Review your medicines with your doctor. Some medicines can make you feel dizzy. This can increase your chance of falling. Ask your doctor what other things that you can do to help prevent falls. This information is not intended to replace advice given to you by your health care provider. Make sure you discuss any questions you have with your health care provider. Document Released: 11/09/2008 Document Revised: 06/21/2015 Document Reviewed: 02/17/2014 Elsevier Interactive Patient Education  2017 [employer].

## 2022-07-16 ENCOUNTER — Other Ambulatory Visit: Payer: Self-pay | Admitting: Family Medicine

## 2022-07-16 DIAGNOSIS — D61818 Other pancytopenia: Secondary | ICD-10-CM

## 2022-07-16 DIAGNOSIS — E119 Type 2 diabetes mellitus without complications: Secondary | ICD-10-CM

## 2022-07-16 DIAGNOSIS — R7989 Other specified abnormal findings of blood chemistry: Secondary | ICD-10-CM

## 2022-07-17 ENCOUNTER — Telehealth: Payer: Self-pay | Admitting: Family Medicine

## 2022-07-17 NOTE — Telephone Encounter (Signed)
Pt is needing Blood Glucose meter kit. Insurance no longer covers her [employer] 2 reader. She is also asking if she can donate the reader she has back to [place] since she cannot use it.    [id number] [place], [place] - [address] Phone: [phone]  Fax: [phone]

## 2022-07-22 MED ORDER — LOSARTAN POTASSIUM 50 MG PO TABS
50.0000 mg | ORAL_TABLET | Freq: Every day | ORAL | 1 refills | Status: DC
Start: 2022-07-22 — End: 2022-10-15

## 2022-07-22 MED ORDER — PRAZOSIN HCL 1 MG PO CAPS: ORAL_CAPSULE | ORAL | 1 refills | Status: DC

## 2022-07-22 MED ORDER — VENLAFAXINE HCL ER 150 MG PO CP24: ORAL_CAPSULE | ORAL | 1 refills | Status: DC

## 2022-07-22 NOTE — Addendum Note (Signed)
Addended by: Jobe Gibbon on: 07/22/2022 11:58 AM   Modules accepted: Orders

## 2022-07-24 DIAGNOSIS — S62615A Displaced fracture of proximal phalanx of left ring finger, initial encounter for closed fracture: Secondary | ICD-10-CM | POA: Diagnosis not present

## 2022-07-28 ENCOUNTER — Encounter (INDEPENDENT_AMBULATORY_CARE_PROVIDER_SITE_OTHER): Payer: Medicare Other | Admitting: Ophthalmology

## 2022-07-28 DIAGNOSIS — H353231 Exudative age-related macular degeneration, bilateral, with active choroidal neovascularization: Secondary | ICD-10-CM | POA: Diagnosis not present

## 2022-07-28 DIAGNOSIS — I10 Essential (primary) hypertension: Secondary | ICD-10-CM

## 2022-07-28 DIAGNOSIS — H35033 Hypertensive retinopathy, bilateral: Secondary | ICD-10-CM | POA: Diagnosis not present

## 2022-07-28 DIAGNOSIS — H43813 Vitreous degeneration, bilateral: Secondary | ICD-10-CM | POA: Diagnosis not present

## 2022-08-04 NOTE — Telephone Encounter (Signed)
Patient stated that she will call insurance or [facility] to find out which one is covered and give the office a call back.

## 2022-08-04 NOTE — Telephone Encounter (Signed)
Patient requests to be called re: Patient has still not heard from Pharmacy what substitute for the FreeStyle [place] 2 has been to Pharmacy.

## 2022-08-06 ENCOUNTER — Telehealth: Payer: Self-pay | Admitting: Family Medicine

## 2022-08-06 ENCOUNTER — Other Ambulatory Visit: Payer: Self-pay | Admitting: *Deleted

## 2022-08-06 DIAGNOSIS — R7989 Other specified abnormal findings of blood chemistry: Secondary | ICD-10-CM

## 2022-08-06 DIAGNOSIS — D61818 Other pancytopenia: Secondary | ICD-10-CM

## 2022-08-06 MED ORDER — METOPROLOL SUCCINATE ER 50 MG PO TB24
50.0000 mg | ORAL_TABLET | Freq: Every day | ORAL | 0 refills | Status: DC
Start: 2022-08-06 — End: 2022-08-29

## 2022-08-06 NOTE — Telephone Encounter (Signed)
Rx sent to the pharmacy.

## 2022-08-06 NOTE — Telephone Encounter (Signed)
Patient requests the following RX that was previously prescribed by Oncologist no longer with [facility]. Patient states she is out of the medication and is scheduled for Office Visit 08/15/22 to discuss. Requests RX for enough medication to last until Office Visit:   metoprolol succinate (TOPROL-XL) 50 MG 24 hr tablet  [id number] [place],  - [address] Phone: [phone]  Fax: [phone]

## 2022-08-15 ENCOUNTER — Ambulatory Visit: Payer: Self-pay | Admitting: Family Medicine

## 2022-08-20 ENCOUNTER — Telehealth: Payer: Self-pay | Admitting: Family Medicine

## 2022-08-20 NOTE — Telephone Encounter (Signed)
Pt thought she was getting a UTI and took Cefalexin for 3 days and is feeling better but still would like to talk to [facility]. Please advise.

## 2022-08-20 NOTE — Telephone Encounter (Signed)
Patient stated she had diarrhea and she guess some of the stool got into her vaginal area and she started to feel like she had a UTI. She took Keflex and feels better, she questioned should she continue taking medication, on day 5, or stop taking it. She also stated she will call insurance and see what monitor they will cover due to her not being on insulin and will give office a call back.

## 2022-08-20 NOTE — Telephone Encounter (Signed)
Patient notified and verbalized understanding. 

## 2022-08-20 NOTE — Telephone Encounter (Signed)
Also,   Prescription Request  08/20/2022  LOV: 06/25/2022  What is the name of the medication or equipment? Pt states insurance will not pay for the Freestyle she usually uses - now she needs RX for Blood Glucose Monitor  Have you contacted your pharmacy to request a refill? No   Which pharmacy would you like this sent to? [id number] [place], [place] - [address] [place] [address] Phone: [phone] Fax: [phone]   Patient notified that their request is being sent to the clinical staff for review and that they should receive a response within 2 business days.   Please advise at Mobile [phone] (mobile)

## 2022-08-29 ENCOUNTER — Other Ambulatory Visit: Payer: Self-pay | Admitting: Family Medicine

## 2022-08-29 DIAGNOSIS — D61818 Other pancytopenia: Secondary | ICD-10-CM

## 2022-08-29 DIAGNOSIS — R7989 Other specified abnormal findings of blood chemistry: Secondary | ICD-10-CM

## 2022-09-01 ENCOUNTER — Encounter (INDEPENDENT_AMBULATORY_CARE_PROVIDER_SITE_OTHER): Payer: Medicare Other | Admitting: Ophthalmology

## 2022-09-01 ENCOUNTER — Ambulatory Visit: Payer: Medicare Other | Admitting: Clinical

## 2022-09-01 DIAGNOSIS — H35033 Hypertensive retinopathy, bilateral: Secondary | ICD-10-CM

## 2022-09-01 DIAGNOSIS — H43813 Vitreous degeneration, bilateral: Secondary | ICD-10-CM

## 2022-09-01 DIAGNOSIS — H353231 Exudative age-related macular degeneration, bilateral, with active choroidal neovascularization: Secondary | ICD-10-CM | POA: Diagnosis not present

## 2022-09-01 DIAGNOSIS — I10 Essential (primary) hypertension: Secondary | ICD-10-CM | POA: Diagnosis not present

## 2022-09-02 ENCOUNTER — Ambulatory Visit (INDEPENDENT_AMBULATORY_CARE_PROVIDER_SITE_OTHER): Payer: Medicare Other | Admitting: Family Medicine

## 2022-09-02 ENCOUNTER — Encounter: Payer: Self-pay | Admitting: Family Medicine

## 2022-09-02 ENCOUNTER — Ambulatory Visit (HOSPITAL_BASED_OUTPATIENT_CLINIC_OR_DEPARTMENT_OTHER)
Admission: RE | Admit: 2022-09-02 | Discharge: 2022-09-02 | Disposition: A | Discharge status: Home / Self Care | Payer: Medicare Other | Source: Ambulatory Visit | Attending: Family Medicine | Admitting: Family Medicine

## 2022-09-02 VITALS — BP 120/70 | HR 85 | Temp 98.2°F | Resp 18 | Ht 65.0 in | Wt 193.4 lb

## 2022-09-02 DIAGNOSIS — E119 Type 2 diabetes mellitus without complications: Secondary | ICD-10-CM | POA: Diagnosis not present

## 2022-09-02 DIAGNOSIS — I10 Essential (primary) hypertension: Secondary | ICD-10-CM

## 2022-09-02 DIAGNOSIS — Z794 Long term (current) use of insulin: Secondary | ICD-10-CM | POA: Diagnosis not present

## 2022-09-02 DIAGNOSIS — R0609 Other forms of dyspnea: Secondary | ICD-10-CM

## 2022-09-02 DIAGNOSIS — R9389 Abnormal findings on diagnostic imaging of other specified body structures: Secondary | ICD-10-CM | POA: Diagnosis not present

## 2022-09-02 DIAGNOSIS — R0602 Shortness of breath: Secondary | ICD-10-CM | POA: Diagnosis not present

## 2022-09-02 DIAGNOSIS — R5383 Other fatigue: Secondary | ICD-10-CM | POA: Diagnosis not present

## 2022-09-02 MED ORDER — ONETOUCH VERIO VI STRP
1.0000 | ORAL_STRIP | Freq: Every day | 3 refills | Status: AC
Start: 2022-09-02 — End: ?

## 2022-09-02 MED ORDER — ONETOUCH ULTRASOFT LANCETS MISC
1.0000 | Freq: Every day | 3 refills | Status: AC
Start: 2022-09-02 — End: ?

## 2022-09-02 MED ORDER — ONETOUCH VERIO FLEX SYSTEM W/DEVICE KIT
1.0000 | PACK | Freq: Every day | 1 refills | Status: AC
Start: 2022-09-02 — End: ?

## 2022-09-02 NOTE — Progress Notes (Signed)
Subjective:     Patient ID: [name], female    DOB: 10-21-1945, 77 y.o.   MRN: [id number]  Chief Complaint  Patient presents with   Discuss medications    Discuss medications and correct doses    Fatigue    Feeling tired all the time   HPI  HTN - Pt is on losartan 25 mg and metoprolol 50 mg once daily. At one point, was on 2 of the 25mg  losartan, but pt only taking 1 tab.   Systolic Bp's running 140-150s. She does not remember diastolic, says its never in the 80s . BP at initial check was 140/80. BP at recheck was 120/70. No ha/dizziness/cp/palp/edema/cough +sob   SOB - Patient states that she has been experiencing SOB beginning about 3-4 weeks ago even with little exertion. She endorses accompanied sweating and difficulty breathing when doing activity, such as walking up half a flight of steps or walking long distances. Denies chest pain,cough.   Fatigue - She states for the past several months, she can receive 12 hrs of sleep a night and still feel tired in the mornings. Patient states she frequently yawns.   Type 2 DM - Patient is compliant with metformin 1000mg  and 500mg   HLD - Patient is compliant with simvastatin 40 mg once daily.  GAD - Patient is compliant with 150 mg venlafaxine. She brought all her bottles of meds and found sertraline 100mg (which was not on med list) but is taking that as well.  No SI   prazosin working well for nightmares.  Health Maintenance Due  Topic Date Due   OPHTHALMOLOGY EXAM  12/26/2021   FOOT EXAM  05/28/2022    Past Medical History:  Diagnosis Date   Anxiety    Arthritis    Blood transfusion without reported diagnosis    Cataract    Cough 01/11/2014   Depression    Diabetes type 2, controlled (HCC)    Esophageal reflux disease    Hypercholesterolemia    Hypertension    Ischemic colitis (HCC) 01/28/2007   Liver enzyme elevation    Lung nodule 06/11/2012   Macular degeneration, wet (HCC)    Multiple myeloma (HCC) 05/27/2012    Cytogenetics on 06/08/2012 was normal 46, XX [20]   Osteoporosis    Other fatigue 02/08/2014   Oxygen deficiency    Pancytopenia (HCC)    Pneumonia    Post-nasal drip 01/11/2014   Sepsis (HCC)    Sleep apnea    does not use CPAP   Tuberculosis     (+ TB TEST)YEARS AGO POSSIBLE EXPOSURE WAS MED TX    Type 2 diabetes mellitus without complications (HCC) 01/31/2014    Past Surgical History:  Procedure Laterality Date   APPENDECTOMY  1998   COLONOSCOPY  01/28/2011   poly; Dr. Randa Evens, next one due 2018.l   LEG SURGERY     OPEN REDUCTION INTERNAL FIXATION (ORIF) HAND Left 04/29/2022   Procedure: OPEN REDUCTION INTERNAL FIXATION with percutaneous pinning, left ring finger proximal phalanx;  Surgeon: Gomez Cleverly, MD;  Location: [facility];  Service: Orthopedics;  Laterality: Left;  mac and regional   Stem cell  2015   TONSILLECTOMY     VIDEO BRONCHOSCOPY WITH ENDOBRONCHIAL NAVIGATION Right 07/08/2012   Procedure: VIDEO BRONCHOSCOPY WITH ENDOBRONCHIAL NAVIGATION;  Surgeon: Leslye Peer, MD;  Location: [facility];  Service: Thoracic;  Laterality: Right;     Current Outpatient Medications:    Blood Glucose Monitoring Suppl (ONETOUCH VERIO FLEX  SYSTEM) w/Device KIT, 1 each by Does not apply route daily at 12 noon., Disp: 1 kit, Rfl: 1   buPROPion (WELLBUTRIN XL) 150 MG 24 hr tablet, Take 150 mg by mouth every morning., Disp: , Rfl:    cholecalciferol (VITAMIN D) 1000 UNITS tablet, Take 2,000 Units by mouth daily., Disp: , Rfl:    glucose blood (ONETOUCH VERIO) test strip, 1 each by Other route daily at 12 noon., Disp: 100 each, Rfl: 3   Lancets (ONETOUCH ULTRASOFT) lancets, 1 each by Other route daily at 12 noon. Use as instructed, Disp: 100 each, Rfl: 3   losartan (COZAAR) 50 MG tablet, Take 1 tablet (50 mg total) by mouth daily. (Patient taking differently: Take 50 mg by mouth daily. Only taking 25 mg), Disp: 90 tablet, Rfl: 1   magnesium chloride (SLOW-MAG) 64 MG TBEC SR  tablet, Take 1 tablet by mouth 2 (two) times daily. , Disp: , Rfl:    metFORMIN (GLUCOPHAGE) 500 MG tablet, TAKE 1 IN THE MORNING AND 2 IN THE EVENING BY MOUTH, Disp: 270 tablet, Rfl: 1   metoprolol succinate (TOPROL-XL) 50 MG 24 hr tablet, TAKE 1 TABLET BY MOUTH DAILY. TAKE WITH OR IMMEDIATELY FOLLOWING A MEAL., Disp: 90 tablet, Rfl: 1   Multiple Vitamins-Minerals (MULTIVITAMIN WITH MINERALS) tablet, Take 1 tablet by mouth at bedtime. , Disp: , Rfl:    Multiple Vitamins-Minerals (PRESERVISION AREDS 2) CAPS, Take 1 capsule by mouth 2 (two) times daily., Disp: , Rfl:    prazosin (MINIPRESS) 1 MG capsule, TAKE 1 CAPSULE BY MOUTH EVERYDAY AT BEDTIME, Disp: 90 capsule, Rfl: 1   sertraline (ZOLOFT) 100 MG tablet, Take 100 mg by mouth daily., Disp: , Rfl:    simvastatin (ZOCOR) 40 MG tablet, Take 1 tablet (40 mg total) by mouth at bedtime., Disp: 90 tablet, Rfl: 1   trimethoprim-polymyxin b (POLYTRIM) ophthalmic solution, Place into both eyes., Disp: , Rfl:    venlafaxine XR (EFFEXOR-XR) 150 MG 24 hr capsule, TAKE 1 CAPSULE BY MOUTH EVERY DAY WITH FOOD FOR 30 DAYS, Disp: 90 capsule, Rfl: 1  No Known Allergies ROS neg/noncontributory except as noted HPI/below      Objective:     BP 120/70 (Patient Position: Sitting, Cuff Size: Large)   Pulse 85   Temp 98.2 F (36.8 C) (Temporal)   Resp 18   Ht 5\' 5"  (1.651 m)   Wt 193 lb 6 oz (87.7 kg)   SpO2 96%   BMI 32.18 kg/m  Wt Readings from Last 3 Encounters:  09/02/22 193 lb 6 oz (87.7 kg)  07/08/22 201 lb (91.2 kg)  06/25/22 201 lb 8 oz (91.4 kg)    Physical Exam   Gen: WDWN NAD HEENT: NCAT, conjunctiva not injected, sclera nonicteric NECK:  supple, no thyromegaly, no nodes, no carotid bruits CARDIAC: RRR, S1S2+,DP 1+B +1/6 systolic murmur  LUNGS: CTAB. No wheezes ABDOMEN:  BS+, soft, NTND, No HSM, no masses EXT:  no edema MSK: no gross abnormalities.  NEURO: A&O x3.  CN II-XII intact.  PSYCH: normal mood. Good eye contact  EKG: NSR,  no ST changes.    Orders placed or performed in visit on 09/02/22   EKG 12-Lead    Assessment & Plan:  Type 2 diabetes mellitus without complication, without long-term current use of insulin (HCC) -     OneTouch Verio Flex System; 1 each by Does not apply route daily at 12 noon.  Dispense: 1 kit; Refill: 1 -     OneTouch Verio;  1 each by Other route daily at 12 noon.  Dispense: 100 each; Refill: 3 -     OneTouch UltraSoft Lancets; 1 each by Other route daily at 12 noon. Use as instructed  Dispense: 100 each; Refill: 3 -     Ambulatory referral to Cardiology  DOE (dyspnea on exertion) -     DG Chest 2 View; Future -     EKG 12-Lead -     Ambulatory referral to Cardiology -     CBC with Differential/Platelet -     Comprehensive metabolic panel -     TSH  Essential hypertension  Other fatigue  1-type 2 diabetes-chronic.  Well-controlled.  Prescription written for monitor strips.  Check daily.  She was using a freestyle libre, but insurance will not cover it as she is not on insulin. 2.  Essential hypertension-chronic.  Well-controlled.  There were some discrepancies in her med list.  Continue metoprolol 50 mg daily and losartan 25 mg daily (she has many bottles of this available) 3.  Dyspnea on exertion-new for the past few weeks.  Question cardiac (has multiple risk factors i.e. diabetes, hypertension) also has mild anemia, will check labs to make sure not worsening.  Also will check chest x-ray for other causes.  Refer to cardiology 4.  Fatigue-seems to be a more chronic problem.  Will check labs  Return in about 4 weeks (around 09/30/2022) for fatigue.  Germaine Pomfret Rice,acting as a scribe for Angelena Sole, MD.,have documented all relevant documentation on the behalf of Angelena Sole, MD,as directed by  Angelena Sole, MD while in the presence of Angelena Sole, MD.   I, Angelena Sole, MD, have reviewed all documentation for this visit. The documentation on 09/02/22 for the exam, diagnosis,  procedures, and orders are all accurate and complete.  Angelena Sole, MD

## 2022-09-02 NOTE — Patient Instructions (Signed)

## 2022-09-03 DIAGNOSIS — H26493 Other secondary cataract, bilateral: Secondary | ICD-10-CM | POA: Diagnosis not present

## 2022-09-03 DIAGNOSIS — H353231 Exudative age-related macular degeneration, bilateral, with active choroidal neovascularization: Secondary | ICD-10-CM | POA: Diagnosis not present

## 2022-09-03 DIAGNOSIS — E119 Type 2 diabetes mellitus without complications: Secondary | ICD-10-CM | POA: Diagnosis not present

## 2022-09-03 DIAGNOSIS — H35033 Hypertensive retinopathy, bilateral: Secondary | ICD-10-CM | POA: Diagnosis not present

## 2022-09-03 NOTE — Progress Notes (Signed)
Chest x-ray-nothing new.  She may want to f/u w/her pulmonologist about the breathing trouble

## 2022-09-04 NOTE — Progress Notes (Signed)
Labs ok except calcium slightly elevated.  Will repeat next month at appt

## 2022-09-08 ENCOUNTER — Encounter: Payer: Self-pay | Admitting: Family Medicine

## 2022-09-09 ENCOUNTER — Other Ambulatory Visit: Payer: Self-pay | Admitting: *Deleted

## 2022-09-09 DIAGNOSIS — D61818 Other pancytopenia: Secondary | ICD-10-CM

## 2022-09-09 DIAGNOSIS — R7989 Other specified abnormal findings of blood chemistry: Secondary | ICD-10-CM

## 2022-09-09 MED ORDER — METOPROLOL SUCCINATE ER 50 MG PO TB24
50.0000 mg | ORAL_TABLET | Freq: Every day | ORAL | 1 refills | Status: DC
Start: 2022-09-09 — End: 2022-10-15

## 2022-09-11 ENCOUNTER — Encounter (INDEPENDENT_AMBULATORY_CARE_PROVIDER_SITE_OTHER): Payer: Self-pay

## 2022-09-12 ENCOUNTER — Inpatient Hospital Stay: Payer: Medicare Other | Attending: Family Medicine

## 2022-09-12 DIAGNOSIS — C9000 Multiple myeloma not having achieved remission: Secondary | ICD-10-CM | POA: Insufficient documentation

## 2022-09-18 ENCOUNTER — Telehealth: Payer: Self-pay

## 2022-09-18 NOTE — Telephone Encounter (Signed)
Called and rescheduled her missed lab to tomorrow and MD appt to 9/10. She is aware of appts.

## 2022-09-19 ENCOUNTER — Inpatient Hospital Stay: Payer: Medicare Other

## 2022-09-19 DIAGNOSIS — Z9484 Stem cells transplant status: Secondary | ICD-10-CM

## 2022-09-19 DIAGNOSIS — C9000 Multiple myeloma not having achieved remission: Secondary | ICD-10-CM | POA: Diagnosis not present

## 2022-09-19 DIAGNOSIS — C9001 Multiple myeloma in remission: Secondary | ICD-10-CM

## 2022-09-19 LAB — CBC WITH DIFFERENTIAL/PLATELET
Abs Immature Granulocytes: 0.01 10*3/uL (ref 0.00–0.07)
Basophils Absolute: 0 10*3/uL (ref 0.0–0.1)
Basophils Relative: 0 %
Eosinophils Absolute: 0.1 10*3/uL (ref 0.0–0.5)
Eosinophils Relative: 3 %
HCT: 34.1 % — ABNORMAL LOW (ref 36.0–46.0)
Hemoglobin: 11.4 g/dL — ABNORMAL LOW (ref 12.0–15.0)
Immature Granulocytes: 0 %
Lymphocytes Relative: 41 %
Lymphs Abs: 1.8 10*3/uL (ref 0.7–4.0)
MCH: 30.8 pg (ref 26.0–34.0)
MCHC: 33.4 g/dL (ref 30.0–36.0)
MCV: 92.2 fL (ref 80.0–100.0)
Monocytes Absolute: 0.3 10*3/uL (ref 0.1–1.0)
Monocytes Relative: 7 %
Neutro Abs: 2.1 10*3/uL (ref 1.7–7.7)
Neutrophils Relative %: 49 %
Platelets: 153 10*3/uL (ref 150–400)
RBC: 3.7 MIL/uL — ABNORMAL LOW (ref 3.87–5.11)
RDW: 14.7 % (ref 11.5–15.5)
WBC: 4.4 10*3/uL (ref 4.0–10.5)
nRBC: 0 % (ref 0.0–0.2)

## 2022-09-19 LAB — CMP (CANCER CENTER ONLY)
ALT: 19 U/L (ref 0–44)
AST: 14 U/L — ABNORMAL LOW (ref 15–41)
Albumin: 3.7 g/dL (ref 3.5–5.0)
Alkaline Phosphatase: 101 U/L (ref 38–126)
Anion gap: 8 (ref 5–15)
BUN: 16 mg/dL (ref 8–23)
CO2: 24 mmol/L (ref 22–32)
Calcium: 9.3 mg/dL (ref 8.9–10.3)
Chloride: 109 mmol/L (ref 98–111)
Creatinine: 0.82 mg/dL (ref 0.44–1.00)
GFR, Estimated: >60 mL/min (ref >60)
Glucose, Bld: 161 mg/dL — ABNORMAL HIGH (ref 70–99)
Potassium: 4.2 mmol/L (ref 3.5–5.1)
Sodium: 141 mmol/L (ref 135–145)
Total Bilirubin: 0.4 mg/dL (ref 0.3–1.2)
Total Protein: 6.6 g/dL (ref 6.5–8.1)

## 2022-09-22 LAB — KAPPA/LAMBDA LIGHT CHAINS
Kappa free light chain: 23.5 mg/L — ABNORMAL HIGH (ref 3.3–19.4)
Kappa, lambda light chain ratio: 1.11 (ref 0.26–1.65)
Lambda free light chains: 21.2 mg/L (ref 5.7–26.3)

## 2022-09-23 ENCOUNTER — Inpatient Hospital Stay: Payer: Medicare Other | Admitting: Hematology and Oncology

## 2022-09-25 ENCOUNTER — Ambulatory Visit (INDEPENDENT_AMBULATORY_CARE_PROVIDER_SITE_OTHER): Payer: Medicare Other | Admitting: Clinical

## 2022-09-25 DIAGNOSIS — F4323 Adjustment disorder with mixed anxiety and depressed mood: Secondary | ICD-10-CM | POA: Diagnosis not present

## 2022-09-25 LAB — MULTIPLE MYELOMA PANEL, SERUM
Albumin SerPl Elph-Mcnc: 3.5 g/dL (ref 2.9–4.4)
Albumin/Glob SerPl: 1.3 (ref 0.7–1.7)
Alpha 1: 0.2 g/dL (ref 0.0–0.4)
Alpha2 Glob SerPl Elph-Mcnc: 0.7 g/dL (ref 0.4–1.0)
B-Globulin SerPl Elph-Mcnc: 1 g/dL (ref 0.7–1.3)
Gamma Glob SerPl Elph-Mcnc: 1 g/dL (ref 0.4–1.8)
Globulin, Total: 2.9 g/dL (ref 2.2–3.9)
IgA: 192 mg/dL (ref 64–422)
IgG (Immunoglobin G), Serum: 1071 mg/dL (ref 586–1602)
IgM (Immunoglobulin M), Srm: 24 mg/dL — ABNORMAL LOW (ref 26–217)
Total Protein ELP: 6.4 g/dL (ref 6.0–8.5)
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
## 2022-10-02 ENCOUNTER — Encounter: Payer: Self-pay | Admitting: Family Medicine

## 2022-10-02 ENCOUNTER — Ambulatory Visit (INDEPENDENT_AMBULATORY_CARE_PROVIDER_SITE_OTHER): Payer: Medicare Other | Admitting: Family Medicine

## 2022-10-02 VITALS — BP 110/60 | HR 75 | Temp 97.8°F | Resp 18 | Ht 65.0 in | Wt 199.2 lb

## 2022-10-02 DIAGNOSIS — E119 Type 2 diabetes mellitus without complications: Secondary | ICD-10-CM | POA: Diagnosis not present

## 2022-10-02 DIAGNOSIS — Z7984 Long term (current) use of oral hypoglycemic drugs: Secondary | ICD-10-CM

## 2022-10-02 DIAGNOSIS — I10 Essential (primary) hypertension: Secondary | ICD-10-CM

## 2022-10-02 DIAGNOSIS — R5383 Other fatigue: Secondary | ICD-10-CM

## 2022-10-02 LAB — POCT GLYCOSYLATED HEMOGLOBIN (HGB A1C): Hemoglobin A1C: 6.8 % — AB (ref 4.0–5.6)

## 2022-10-02 NOTE — Assessment & Plan Note (Signed)
Chronic.  Over Controlled.  Continue cozaar 25mg  daily. Decrease metoprolol to 1/2 tab(so 25mg ) . if heart racing, go back to the 50.     Consider decreasing the Losartan-not sure if lower bp contributing to DOE/fatigue or if cardiac.  Await Card input

## 2022-10-02 NOTE — Progress Notes (Signed)
Subjective:     Patient ID: [name], female    DOB: Sep 13, 1945, 77 y.o.   MRN: [id number]  Chief Complaint  Patient presents with   Follow-up    4 week follow-up on fatigue, still having some fatigue Would like to discuss medications     HPI  Fatigue - She reports she continues to experience fatigue and SOB since her last visit on 8/6. Will be seeing Card soon.  DM, Type 2 - She has been monitoring her sugars at home. She states her sleep schedule varies and she is unable to take her sugars at the same time daily. 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 She is receptive to checking A1C today, results were 6.8. taking 3 metformin/day.  Also on statin, ARB.seeing retina specialist every 5 wks.   HTN - Pt is on losartan 25 mg and metoprolol 50 mg once daily. Has not been monitoring her blood pressure at home. Has a cuff at home, but is unsure whether it works. Her next follow up with her cardiologist, Dr. Duke Salvia, is on 9/18. No ha/dizziness/cp/palp/edema/cough.  Anxiety/Depression- has started seeing Dr. Dewayne Hatch to manage anxiety/depression. States she takes the wellbutrin daily but still has 3 bottles of it(suspect double filled at some point presuming she is taking it as stated).  Vision - She follows up with a retina specialist, Dr. Ashley Royalty. Next appt is scheduled on 9/11.    Health Maintenance Due  Topic Date Due   OPHTHALMOLOGY EXAM  12/26/2021    Past Medical History:  Diagnosis Date   Anxiety    Arthritis    Blood transfusion without reported diagnosis    Cataract    Cough 01/11/2014   Depression    Diabetes type 2, controlled (HCC)    Esophageal reflux disease    Hypercholesterolemia    Hypertension    Ischemic colitis (HCC) 01/28/2007   Liver enzyme elevation    Lung nodule 06/11/2012   Macular degeneration, wet (HCC)    Multiple myeloma (HCC) 05/27/2012   Cytogenetics on 06/08/2012 was normal 46, XX [20]   Osteoporosis    Other fatigue  02/08/2014   Oxygen deficiency    Pancytopenia (HCC)    Pneumonia    Post-nasal drip 01/11/2014   Sepsis (HCC)    Sleep apnea    does not use CPAP   Tuberculosis     (+ TB TEST)YEARS AGO POSSIBLE EXPOSURE WAS MED TX    Type 2 diabetes mellitus without complications (HCC) 01/31/2014    Past Surgical History:  Procedure Laterality Date   APPENDECTOMY  1998   COLONOSCOPY  01/28/2011   poly; Dr. Randa Evens, next one due 2018.l   LEG SURGERY     OPEN REDUCTION INTERNAL FIXATION (ORIF) HAND Left 04/29/2022   Procedure: OPEN REDUCTION INTERNAL FIXATION with percutaneous pinning, left ring finger proximal phalanx;  Surgeon: Gomez Cleverly, MD;  Location: [facility];  Service: Orthopedics;  Laterality: Left;  mac and regional   Stem cell  2015   TONSILLECTOMY     VIDEO BRONCHOSCOPY WITH ENDOBRONCHIAL NAVIGATION Right 07/08/2012   Procedure: VIDEO BRONCHOSCOPY WITH ENDOBRONCHIAL NAVIGATION;  Surgeon: Leslye Peer, MD;  Location: [facility] OR;  Service: Thoracic;  Laterality: Right;     Current Outpatient Medications:    Blood Glucose Monitoring Suppl (ONETOUCH VERIO FLEX SYSTEM) w/Device KIT, 1 each by Does not apply route daily at 12 noon., Disp: 1 kit, Rfl: 1   buPROPion (WELLBUTRIN XL) 150 MG 24 hr  tablet, Take 150 mg by mouth every morning., Disp: , Rfl:    cholecalciferol (VITAMIN D) 1000 UNITS tablet, Take 2,000 Units by mouth daily., Disp: , Rfl:    glucose blood (ONETOUCH VERIO) test strip, 1 each by Other route daily at 12 noon., Disp: 100 each, Rfl: 3   Lancets (ONETOUCH ULTRASOFT) lancets, 1 each by Other route daily at 12 noon. Use as instructed, Disp: 100 each, Rfl: 3   losartan (COZAAR) 50 MG tablet, Take 1 tablet (50 mg total) by mouth daily. (Patient taking differently: Take 50 mg by mouth daily. Only taking 25 mg), Disp: 90 tablet, Rfl: 1   magnesium chloride (SLOW-MAG) 64 MG TBEC SR tablet, Take 1 tablet by mouth 2 (two) times daily. , Disp: , Rfl:    metFORMIN  (GLUCOPHAGE) 500 MG tablet, TAKE 1 IN THE MORNING AND 2 IN THE EVENING BY MOUTH, Disp: 270 tablet, Rfl: 1   metoprolol succinate (TOPROL-XL) 50 MG 24 hr tablet, Take 1 tablet (50 mg total) by mouth daily. TAKE WITH OR IMMEDIATELY FOLLOWING A MEAL., Disp: 90 tablet, Rfl: 1   Multiple Vitamins-Minerals (MULTIVITAMIN WITH MINERALS) tablet, Take 1 tablet by mouth at bedtime. , Disp: , Rfl:    Multiple Vitamins-Minerals (PRESERVISION AREDS 2) CAPS, Take 1 capsule by mouth 2 (two) times daily., Disp: , Rfl:    prazosin (MINIPRESS) 1 MG capsule, TAKE 1 CAPSULE BY MOUTH EVERYDAY AT BEDTIME, Disp: 90 capsule, Rfl: 1   sertraline (ZOLOFT) 100 MG tablet, Take 100 mg by mouth daily., Disp: , Rfl:    simvastatin (ZOCOR) 40 MG tablet, Take 1 tablet (40 mg total) by mouth at bedtime., Disp: 90 tablet, Rfl: 1   trimethoprim-polymyxin b (POLYTRIM) ophthalmic solution, Place into both eyes., Disp: , Rfl:    venlafaxine XR (EFFEXOR-XR) 150 MG 24 hr capsule, TAKE 1 CAPSULE BY MOUTH EVERY DAY WITH FOOD FOR 30 DAYS, Disp: 90 capsule, Rfl: 1  No Known Allergies ROS neg/noncontributory except as noted HPI/below      Objective:     BP 110/60 (BP Location: Left Arm, Patient Position: Sitting, Cuff Size: Large)   Pulse 75   Temp 97.8 F (36.6 C) (Temporal)   Resp 18   Ht 5\' 5"  (1.651 m)   Wt 199 lb 4 oz (90.4 kg)   SpO2 94%   BMI 33.16 kg/m  Wt Readings from Last 3 Encounters:  10/02/22 199 lb 4 oz (90.4 kg)  09/02/22 193 lb 6 oz (87.7 kg)  07/08/22 201 lb (91.2 kg)    Physical Exam   Gen: WDWN NAD HEENT: NCAT, conjunctiva not injected, sclera nonicteric CARDIAC: RRR, S1S2+. DP 1+B. +1/6 murmur MSK: no gross abnormalities.  NEURO: A&O x3.  CN II-XII intact.  PSYCH: normal mood. Good eye contact  Results for orders placed or performed in visit on 10/02/22  POCT HgB A1C  Result Value Ref Range   Hemoglobin A1C 6.8 (A) 4.0 - 5.6 %   Diabetic Foot Exam - Simple   Simple Foot Form Diabetic Foot  exam was performed with the following findings: Yes 10/02/2022 12:15 PM  Visual Inspection No deformities, no ulcerations, no other skin breakdown bilaterally: Yes Sensation Testing Intact to touch and monofilament testing bilaterally: Yes Pulse Check Posterior Tibialis and Dorsalis pulse intact bilaterally: Yes Comments       Assessment & Plan:  Type 2 diabetes mellitus without complication, without long-term current use of insulin (HCC) -     POCT glycosylated hemoglobin (Hb A1C)  Essential hypertension Assessment & Plan: Chronic.  Over Controlled.  Continue cozaar 25mg  daily. Decrease metoprolol to 1/2 tab(so 25mg ) . if heart racing, go back to the 50.     Consider decreasing the Losartan-not sure if lower bp contributing to DOE/fatigue or if cardiac.  Await Card input   Other fatigue Assessment & Plan: Chronic.  Multifactorial.  May partly be due to meds, anxiety/depression, multiple myeloma in remission, blood pressure, heart, sugars.  Labs were fairly unremarkable.  Await cardiology input.  Will decrease beta-blocker.   Long term current use of oral hypoglycemic drug    Return in about 4 weeks (around 10/30/2022) for HTN.   I,Rachel Rivera,acting as a scribe for Angelena Sole, MD.,have documented all relevant documentation on the behalf of Angelena Sole, MD,as directed by  Angelena Sole, MD while in the presence of Angelena Sole, MD.  I, Angelena Sole, MD, have reviewed all documentation for this visit. The documentation on 10/02/22 for the exam, diagnosis, procedures, and orders are all accurate and complete.  Decrease decrease Angelena Sole, MD

## 2022-10-02 NOTE — Assessment & Plan Note (Signed)
Chronic.  Multifactorial.  May partly be due to meds, anxiety/depression, multiple myeloma in remission, blood pressure, heart, sugars.  Labs were fairly unremarkable.  Await cardiology input.  Will decrease beta-blocker.

## 2022-10-02 NOTE — Assessment & Plan Note (Signed)
Chronic.  Well-controlled.  Continue metformin 500 mg for total of 3 split up throughout the day.  Advised to check sugars several times a week.  Different times of the day.  She is on an ARB and a statin.  Will get a copy of her eye exam (which she gets every 5 weeks due to retinopathy)

## 2022-10-02 NOTE — Patient Instructions (Addendum)
Check sugar once/day but at different times Fasting Before lunch  Before supper 2 hours after meal.     Covid-pharmacy 2 wks rsv-pharmacy 2 wks-flu   Metoprolol-decrease to 1/2 tab/per day-if heart racing, go back to the 50.     Consider decreasing the Losartan

## 2022-10-06 DIAGNOSIS — L308 Other specified dermatitis: Secondary | ICD-10-CM | POA: Diagnosis not present

## 2022-10-07 ENCOUNTER — Encounter: Payer: Self-pay | Admitting: Hematology and Oncology

## 2022-10-07 ENCOUNTER — Inpatient Hospital Stay: Payer: Medicare Other | Attending: Hematology and Oncology | Admitting: Hematology and Oncology

## 2022-10-07 VITALS — BP 140/63 | HR 74 | Resp 18 | Ht 65.0 in | Wt 201.8 lb

## 2022-10-07 DIAGNOSIS — E1165 Type 2 diabetes mellitus with hyperglycemia: Secondary | ICD-10-CM | POA: Insufficient documentation

## 2022-10-07 DIAGNOSIS — Z9481 Bone marrow transplant status: Secondary | ICD-10-CM | POA: Insufficient documentation

## 2022-10-07 DIAGNOSIS — Z7984 Long term (current) use of oral hypoglycemic drugs: Secondary | ICD-10-CM | POA: Diagnosis not present

## 2022-10-07 DIAGNOSIS — Z79899 Other long term (current) drug therapy: Secondary | ICD-10-CM | POA: Insufficient documentation

## 2022-10-07 DIAGNOSIS — E119 Type 2 diabetes mellitus without complications: Secondary | ICD-10-CM | POA: Diagnosis not present

## 2022-10-07 DIAGNOSIS — C7A090 Malignant carcinoid tumor of the bronchus and lung: Secondary | ICD-10-CM | POA: Diagnosis not present

## 2022-10-07 DIAGNOSIS — C9001 Multiple myeloma in remission: Secondary | ICD-10-CM | POA: Insufficient documentation

## 2022-10-07 DIAGNOSIS — D3A090 Benign carcinoid tumor of the bronchus and lung: Secondary | ICD-10-CM

## 2022-10-07 DIAGNOSIS — R0602 Shortness of breath: Secondary | ICD-10-CM | POA: Insufficient documentation

## 2022-10-07 NOTE — Progress Notes (Signed)
[facility] OFFICE PROGRESS NOTE  Patient Care Team: Jeani Sow, MD as PCP - General (Family Medicine) Carman Ching, MD (Inactive) as Attending Physician (Gastroenterology) Osborn Coho, MD (Inactive) as Attending Physician (Otolaryngology) Leslye Peer, MD as Attending Physician (Pulmonary Disease) McIver, Janna Arch, DO as Consulting Physician (Hematology)  ASSESSMENT & PLAN:  Multiple myeloma in remission Her myeloma panel shows remission Her borderline elevated light chain is not related to cancer recurrence She is not symptomatic She has no signs of renal failure, anemia or hypercalcemia I plan to see her again in a year for further follow-up with blood work to be done 10 days prior to appointment  Carcinoid tumor of left lung She usually followed at [facility] She has chronic shortness of breath, unchanged  Type 2 diabetes mellitus without complications She has elevated blood sugar on her recent lab She would continue medical management    Orders Placed This Encounter  Procedures   Comprehensive metabolic panel    Standing Status:   Standing    Number of Occurrences:   33    Standing Expiration Date:   10/07/2023    All questions were answered. The patient knows to call the clinic with any problems, questions or concerns. The total time spent in the appointment was 25 minutes encounter with patients including review of chart and various tests results, discussions about plan of care and coordination of care plan   Artis Delay, MD 10/07/2022 10:55 AM  INTERVAL HISTORY: Please see below for problem oriented charting. she returns for follow-up on history of myeloma She is not symptomatic No recent infection or bone pain We discussed test results  REVIEW OF SYSTEMS:   Constitutional: Denies fevers, chills or abnormal weight loss Eyes: Denies blurriness of vision Ears, nose, mouth, throat, and face: Denies mucositis or sore  throat Respiratory: Denies cough, dyspnea or wheezes Cardiovascular: Denies palpitation, chest discomfort or lower extremity swelling Gastrointestinal:  Denies nausea, heartburn or change in bowel habits Skin: Denies abnormal skin rashes Lymphatics: Denies new lymphadenopathy or easy bruising Neurological:Denies numbness, tingling or new weaknesses Behavioral/Psych: Mood is stable, no new changes  All other systems were reviewed with the patient and are negative.  I have reviewed the past medical history, past surgical history, social history and family history with the patient and they are unchanged from previous note.  ALLERGIES:  has No Known Allergies.  MEDICATIONS:  Current Outpatient Medications  Medication Sig Dispense Refill   Blood Glucose Monitoring Suppl (ONETOUCH VERIO FLEX SYSTEM) w/Device KIT 1 each by Does not apply route daily at 12 noon. 1 kit 1   buPROPion (WELLBUTRIN XL) 150 MG 24 hr tablet Take 150 mg by mouth every morning.     cholecalciferol (VITAMIN D) 1000 UNITS tablet Take 2,000 Units by mouth daily.     glucose blood (ONETOUCH VERIO) test strip 1 each by Other route daily at 12 noon. 100 each 3   Lancets (ONETOUCH ULTRASOFT) lancets 1 each by Other route daily at 12 noon. Use as instructed 100 each 3   losartan (COZAAR) 50 MG tablet Take 1 tablet (50 mg total) by mouth daily. (Patient taking differently: Take 50 mg by mouth daily. Only taking 25 mg) 90 tablet 1   magnesium chloride (SLOW-MAG) 64 MG TBEC SR tablet Take 1 tablet by mouth 2 (two) times daily.      metFORMIN (GLUCOPHAGE) 500 MG tablet TAKE 1 IN THE MORNING AND 2 IN THE EVENING BY MOUTH 270  tablet 1   metoprolol succinate (TOPROL-XL) 50 MG 24 hr tablet Take 1 tablet (50 mg total) by mouth daily. TAKE WITH OR IMMEDIATELY FOLLOWING A MEAL. 90 tablet 1   Multiple Vitamins-Minerals (MULTIVITAMIN WITH MINERALS) tablet Take 1 tablet by mouth at bedtime.      Multiple Vitamins-Minerals (PRESERVISION AREDS 2)  CAPS Take 1 capsule by mouth 2 (two) times daily.     prazosin (MINIPRESS) 1 MG capsule TAKE 1 CAPSULE BY MOUTH EVERYDAY AT BEDTIME 90 capsule 1   sertraline (ZOLOFT) 100 MG tablet Take 100 mg by mouth daily.     simvastatin (ZOCOR) 40 MG tablet Take 1 tablet (40 mg total) by mouth at bedtime. 90 tablet 1   trimethoprim-polymyxin b (POLYTRIM) ophthalmic solution Place into both eyes.     venlafaxine XR (EFFEXOR-XR) 150 MG 24 hr capsule TAKE 1 CAPSULE BY MOUTH EVERY DAY WITH FOOD FOR 30 DAYS 90 capsule 1   No current facility-administered medications for this visit.    SUMMARY OF ONCOLOGIC HISTORY: Oncology History  Multiple myeloma in remission (HCC)  05/27/2012 Initial Diagnosis   Multiple myeloma, without mention of having achieved remission(203.00)   05/27/2012 Cancer Staging   1.  IgG kappa multiple myeloma. She presented in 05/2012 with anemia, Cr 0.8, Ca 9.6; no bone lytic lesion; LDH 140; beta2- microglobulin 1.86 (ref 1.01-1.73). Positive SPEP for M-spike at 1.69, IgG 2080, kappa 7.24, kappa:lambda ratio 24.97; UPEP positive   06/08/2012 Bone Marrow Biopsy   Bone marrow biopsy on 06/08/12 showed 19% plasma cell; normal cytogenetics; myeloma FISH pending.    06/14/2012 Imaging   Skeletal survey negative for bone lytic lesions.  Lung nodule apparent.   09/17/2012 Bone Marrow Biopsy   Bone marrow biopsy showed 11% plasma cells.   10/18/2012 - 12/31/2012 Chemotherapy   Started on VRD (Velcade 1.3 mg/m^2 [place] days 1,8 and 15; Revlimide 25 mg PO daily, on day 1 through 14; Decadron 40 mg PO Days 1, 8, 15).   Started on coumadin given RD.   01/14/2013 Bone Marrow Biopsy   The bone marrow clot sections are normocellular toslightly hypercellular for age (approximately 40% overall). 1% plasma cells.   02/08/2013 Bone Marrow Transplant   Auto Transplant at [facility] (Dr. Janna Arch McIver).   11/30/2013 - 08/23/2014 Chemotherapy   Velcade maintence therapy to start today q 2 weeks,  discontinued due to neuropathy   03/15/2014 Imaging   CT scan of the chest excluded pulmonary emboli   07/26/2014 Adverse Reaction   Velcade dose is reduced due to neuropathy   09/26/2014 - 02/23/2015 Chemotherapy   Treatment was switched to [place] due to neuropathy   02/23/2015 Adverse Reaction   Ninlaro was stopped due to worsening neuropathy   10/30/2016 Imaging   The BMD measured at Femur Neck Left is 0.763 g/cm2 with a T-score of -2.0. 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 Delton Coombes)   07/08/2012 Pathology Results   Pathology consistent with Non-small cell lung cancer of RLL.    07/14/2012 Imaging   1. Right lower lobe nodule is non hypermetabolic which suggests benign etiology considering the slow growing rate in 6 years or non FDG avid disease as seen with carcinoid tumors. Pending biopsy. No PET evidence of mediastinal nodal disease.  08/02/2012 Imaging   MRI of brain negative.   08/12/2012 Surgery   Robotic Lobectomy of R Lower lobe.    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     Surgical resection margins are negative.     Two benign lymph nodes.     pTa1N0   11/08/2012 Cancer Staging   Staging form: Lung, AJCC 7th Edition - Clinical: T1, N0, M0 - Signed by Artis Delay, MD on 06/26/2022     PHYSICAL EXAMINATION: ECOG PERFORMANCE STATUS: 0 - Asymptomatic  Vitals:   10/07/22 1013  BP: (!) 140/63  Pulse: 74  Resp: 18  SpO2: 98%   Filed Weights   10/07/22 1013  Weight: 201 lb 12.8 oz (91.5 kg)    GENERAL:alert, no distress and comfortable NEURO: alert & oriented x 3 with fluent speech, no focal motor/sensory deficits  LABORATORY DATA:  I have reviewed the  data as listed    Component Value Date/Time   NA 141 09/19/2022 1327   NA 141 12/05/2016 1447   K 4.2 09/19/2022 1327   K 4.3 12/05/2016 1447   CL 109 09/19/2022 1327   CL 105 06/08/2012 0913   CO2 24 09/19/2022 1327   CO2 23 12/05/2016 1447   GLUCOSE 161 (H) 09/19/2022 1327   GLUCOSE 131 12/05/2016 1447   GLUCOSE 120 (H) 06/08/2012 0913   BUN 16 09/19/2022 1327   BUN 17.0 12/05/2016 1447   CREATININE 0.82 09/19/2022 1327   CREATININE 0.8 12/05/2016 1447   CALCIUM 9.3 09/19/2022 1327   CALCIUM 9.1 12/05/2016 1447   PROT 6.6 09/19/2022 1327   PROT 6.8 12/05/2016 1447   PROT 6.3 12/05/2016 1446   ALBUMIN 3.7 09/19/2022 1327   ALBUMIN 3.7 12/05/2016 1447   AST 14 (L) 09/19/2022 1327   AST 27 12/05/2016 1447   ALT 19 09/19/2022 1327   ALT 43 12/05/2016 1447   ALKPHOS 101 09/19/2022 1327   ALKPHOS 85 12/05/2016 1447   BILITOT 0.4 09/19/2022 1327   BILITOT 0.68 12/05/2016 1447   GFRNONAA >60 09/19/2022 1327   GFRAA >60 08/17/2019 1132   GFRAA >60 02/18/2019 1123    No results found for: "SPEP", "UPEP"  Lab Results  Component Value Date   WBC 4.4 09/19/2022   NEUTROABS 2.1 09/19/2022   HGB 11.4 (L) 09/19/2022   HCT 34.1 (L) 09/19/2022   MCV 92.2 09/19/2022   PLT 153 09/19/2022      Chemistry      Component Value Date/Time   NA 141 09/19/2022 1327   NA 141 12/05/2016 1447   K 4.2 09/19/2022 1327   K 4.3 12/05/2016 1447   CL 109 09/19/2022 1327   CL 105 06/08/2012 0913   CO2 24 09/19/2022 1327   CO2 23 12/05/2016 1447   BUN 16 09/19/2022 1327   BUN 17.0 12/05/2016 1447   CREATININE 0.82 09/19/2022 1327   CREATININE 0.8 12/05/2016 1447      Component Value Date/Time   CALCIUM 9.3 09/19/2022 1327   CALCIUM 9.1 12/05/2016 1447   ALKPHOS 101 09/19/2022 1327   ALKPHOS 85 12/05/2016 1447   AST 14 (L) 09/19/2022 1327   AST 27 12/05/2016 1447   ALT 19 09/19/2022 1327   ALT 43 12/05/2016 1447   BILITOT 0.4 09/19/2022 1327   BILITOT 0.68 12/05/2016 1447

## 2022-10-07 NOTE — Assessment & Plan Note (Signed)
She usually followed at [facility] She has chronic shortness of breath, unchanged

## 2022-10-07 NOTE — Assessment & Plan Note (Signed)
Her myeloma panel shows remission Her borderline elevated light chain is not related to cancer recurrence She is not symptomatic She has no signs of renal failure, anemia or hypercalcemia I plan to see her again in a year for further follow-up with blood work to be done 10 days prior to appointment

## 2022-10-07 NOTE — Assessment & Plan Note (Signed)
She has elevated blood sugar on her recent lab She would continue medical management

## 2022-10-08 ENCOUNTER — Encounter (INDEPENDENT_AMBULATORY_CARE_PROVIDER_SITE_OTHER): Payer: Medicare Other | Admitting: Ophthalmology

## 2022-10-13 ENCOUNTER — Encounter (INDEPENDENT_AMBULATORY_CARE_PROVIDER_SITE_OTHER): Payer: Medicare Other | Admitting: Ophthalmology

## 2022-10-13 DIAGNOSIS — H353231 Exudative age-related macular degeneration, bilateral, with active choroidal neovascularization: Secondary | ICD-10-CM

## 2022-10-13 DIAGNOSIS — H43813 Vitreous degeneration, bilateral: Secondary | ICD-10-CM | POA: Diagnosis not present

## 2022-10-15 ENCOUNTER — Encounter (HOSPITAL_BASED_OUTPATIENT_CLINIC_OR_DEPARTMENT_OTHER): Payer: Self-pay

## 2022-10-15 ENCOUNTER — Encounter (HOSPITAL_BASED_OUTPATIENT_CLINIC_OR_DEPARTMENT_OTHER): Payer: Self-pay | Admitting: Cardiovascular Disease

## 2022-10-15 ENCOUNTER — Ambulatory Visit: Payer: Medicare Other | Admitting: Clinical

## 2022-10-15 ENCOUNTER — Ambulatory Visit (INDEPENDENT_AMBULATORY_CARE_PROVIDER_SITE_OTHER): Payer: Medicare Other | Admitting: Cardiovascular Disease

## 2022-10-15 ENCOUNTER — Other Ambulatory Visit (HOSPITAL_COMMUNITY): Payer: Self-pay

## 2022-10-15 VITALS — BP 105/69 | HR 95 | Ht 65.0 in | Wt 202.1 lb

## 2022-10-15 DIAGNOSIS — G4733 Obstructive sleep apnea (adult) (pediatric): Secondary | ICD-10-CM | POA: Diagnosis not present

## 2022-10-15 DIAGNOSIS — I272 Pulmonary hypertension, unspecified: Secondary | ICD-10-CM | POA: Diagnosis not present

## 2022-10-15 DIAGNOSIS — R0609 Other forms of dyspnea: Secondary | ICD-10-CM | POA: Diagnosis not present

## 2022-10-15 DIAGNOSIS — R0602 Shortness of breath: Secondary | ICD-10-CM | POA: Diagnosis not present

## 2022-10-15 DIAGNOSIS — I10 Essential (primary) hypertension: Secondary | ICD-10-CM

## 2022-10-15 DIAGNOSIS — G4730 Sleep apnea, unspecified: Secondary | ICD-10-CM | POA: Insufficient documentation

## 2022-10-15 HISTORY — DX: Shortness of breath: R06.02

## 2022-10-15 MED ORDER — IVABRADINE HCL 5 MG PO TABS
10.0000 mg | ORAL_TABLET | Freq: Once | ORAL | 0 refills | Status: AC
Start: 2022-10-15 — End: 2022-11-11
  Filled 2022-10-15 – 2022-11-10 (×2): qty 2, 1d supply, fill #0

## 2022-10-15 NOTE — Patient Instructions (Signed)
Medication Instructions:   STOP TAKING METOPROLOL SUCCINATE NOW  *If you need a refill on your cardiac medications before your next appointment, please call your pharmacy*   Testing/Procedures:  Your physician has requested that you have an echocardiogram. Echocardiography is a painless test that uses sound waves to create images of your heart. It provides your doctor with information about the size and shape of your heart and how well your heart's chambers and valves are working. This procedure takes approximately one hour. There are no restrictions for this procedure. Please do NOT wear cologne, perfume, aftershave, or lotions (deodorant is allowed). Please arrive 15 minutes prior to your appointment time.      Your cardiac CT will be scheduled at one of the below locations:   [facility] [address] [place], [place] [address] [phone]   If scheduled at [facility], please arrive at the [facility] (Entrance C2) [facility] 30 minutes prior to test start time. You can use the FREE valet parking offered at entrance C (encouraged to control the heart rate for the test)  Proceed to the [facility] Radiology Department (first floor) to check-in and test prep.  All radiology patients and guests should use entrance C2 at [facility], accessed from [facility], even though the hospital's physical address listed is [address].      There is spacious parking and easy access to the radiology department from the [facility] entrance. Please enter here and check-in with the desk attendant.   Please follow these instructions carefully (unless otherwise directed):  An IV will be required for this test and Nitroglycerin will be given.    On the Night Before the Test: Be sure to Drink plenty of water. Do not consume any caffeinated/decaffeinated beverages or chocolate 12 hours prior to  your test. Do not take any antihistamines 12 hours prior to your test.   On the Day of the Test: Drink plenty of water until 1 hour prior to the test. Do not eat any food 1 hour prior to test. You may take your regular medications prior to the test.  Take IVABRADINE (CORLANOR) 10 MG BY MOUTH two hours prior to test.  FEMALES- please wear underwire-free bra if available, avoid dresses & tight clothing        After the Test: Drink plenty of water. After receiving IV contrast, you may experience a mild flushed feeling. This is normal. On occasion, you may experience a mild rash up to 24 hours after the test. This is not dangerous. If this occurs, you can take Benadryl 25 mg and increase your fluid intake. If you experience trouble breathing, this can be serious. If it is severe call 911 IMMEDIATELY. If it is mild, please call our office. If you take any of these medications: Glipizide/Metformin, Avandament, Glucavance, please do not take 48 hours after completing test unless otherwise instructed.  We will call to schedule your test 2-4 weeks out understanding that some insurance companies will need an authorization prior to the service being performed.   For more information and frequently asked questions, please visit our website : [email or web address]  For non-scheduling related questions, please contact the cardiac imaging nurse navigator should you have any questions/concerns: Cardiac Imaging Nurse Navigators Direct Office Dial: [phone]   For scheduling needs, including cancellations and rescheduling, please call [place], [phone].    Follow-Up:  6 WEEKS WITH Gillian Shields NP HERE  AT [facility]--PLEASE BRING YOUR BLOOD PRESSURE RECORDINGS WITH YOU TO THIS VISIT    Other Instructions  PLEASE START TAKING YOUR BLOOD PRESSURE AND LOGGING THIS EVERYDAY.  PLEASE BRING YOUR RECORDINGS TO YOUR 6 WEEK APPOINTMENT WITH CAITLIN WALKER NP     You can look at  the website [email or web address] to find a good, trusted blood pressure cuff.  Blood Pressure Record Sheet To take your blood pressure, you will need a blood pressure machine. You can buy a blood pressure machine (blood pressure monitor) at your clinic, drug store, or online. When choosing one, consider: An automatic monitor that has an arm cuff. A cuff that wraps snugly around your upper arm. You should be able to fit only one finger between your arm and the cuff. A device that stores blood pressure reading results. Do not choose a monitor that measures your blood pressure from your wrist or finger. Follow your health care provider's instructions for how to take your blood pressure. To use this form: Take your blood pressure medications every day These measurements should be taken when you have been at rest for at least 10-15 min Take at least 2 readings with each blood pressure check. This makes sure the results are correct. Wait 1-2 minutes between measurements. Write down the results in the spaces on this form. Keep in mind it should always be recorded systolic over diastolic. Both numbers are important.  Repeat this every day for 2-3 weeks, or as told by your health care provider.  Make a follow-up appointment with your health care provider to discuss the results.  Blood Pressure Log Date Medications taken? (Y/N) Blood Pressure Time of Day

## 2022-10-15 NOTE — Progress Notes (Signed)
Cardiology Office Note:  .    Date:  10/15/2022  ID:  Sherrye Payor, DOB May 24, 1945, MRN [id number] PCP: Jeani Sow, MD  [facility] Providers Cardiologist:  None     History of Present Illness: Marland Kitchen    [name] is a 77 y.o. female with HFpEF, hypertension, hyperlipidemia, type 2 diabetes mellitus, anemia, anxiety, multiple myeloma s/p chemotherapy 06/2012, sleep apnea not on CPAP, who presents for evaluation of dyspnea on exertion at the request of Dr. Jeani Sow. She was seen by her PCP 09/02/2022 and complained of feeling short of breath with minimal exertion for 3-4 weeks. Associated with diaphoresis but no chest pain. Also noted fatigue and daytime somnolence despite sleeping up to 12 hours. Her blood pressure was 140/80 at that visit, 120/70 on recheck (on losartan 25 mg and metoprolol 50 mg daily). Also noted to be mildly anemic. EKG showed NSR with no ST changes. Chest x-ray 08/2022 showed no active disease, chronic scarring on the left, chronic elevation of right hemidiaphragm, s/p right lobectomy. She was referred to cardiology for further evaluation.   Previously evaluated by cardiology with Dr. Jorge Ny in 11/2013. Noted to have a history of HFpEF and pulmonary edema after receiving Velcade in early 01/2013. Had pneumonia 09/2013. Workup for cardiac amyloid was held since she only had mild LVH.   Today, she complains of shortness of breath and feeling fatigued very easily for about 3-4 weeks. Her shortness of breath is exertional and associated with profuse perspiration. She denies feeling dyspneic while sitting. She is not aware of any swelling in her legs and feet; no orthopnea. No formal exercise, but she does keep up with her housework which is tiring. Her exercise tolerance was better several months ago before the onset of her symptoms. In the office her blood pressure is 105/69. She does not monitor her home blood pressures. On Monday she received an injection in  her eye, and her blood pressure was 125 systolic.  No racing palpitations. EKG was performed today which showed normal sinus rhythm at 95 bpm. She confirms snoring, and has had a prior sleep study. She has an oral appliance which she hasn't used for about 6 months. She fell out of the habit after a hand injury prevented her from using it. Of note, she has not been able to see a significant difference in her fatigue whether she was using her oral appliance or not. Additionally she states that she has a lot of emotional turbulence in her life, and she is currently seeing a Veterinary surgeon. She has been taking prazosin to help her sleep which was prescribed because of nightmares. She notes that she is sleeping up to 12 hours per night but still feels fatigued. She is a former smoker. 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 She denies any chest pain, lightheadedness, headaches, syncope, or PND.  ROS:  Please see the history of present illness. All other systems are reviewed and negative.  (+) Exertional shortness of breath (+) Fatigue (+) Daytime somnolence (+) Diaphoresis (+) Snoring  Studies Reviewed: Marland Kitchen    EKG Interpretation Date/Time:  Wednesday October 15 2022 11:02:27 EDT Ventricular Rate:  95 PR Interval:  162 QRS Duration:  80 QT Interval:  360 QTC Calculation: 452 R Axis:   61  Text Interpretation: Normal sinus rhythm Normal ECG When compared with  ECG of 28-Apr-2022 10:45, QT has lengthened Confirmed by Chilton Si ([id number]) on 10/15/2022 11:16:45 AM    Chest X-Ray  09/02/2022: FINDINGS: Heart size is normal. Chronic scarring on the left but without evidence of active infiltrate, effusion or collapse. Chronic elevation of the right hemidiaphragm, status post right lobectomy. Small amount of pleural scarring inferiorly. No active process is evident.   IMPRESSION: No active  disease. Chronic scarring on the left. Chronic elevation of the right hemidiaphragm, status post right lobectomy.  Risk Assessment/Calculations:             Physical Exam:    VS:  BP 105/69 (BP Location: Left Arm, Patient Position: Sitting, Cuff Size: Large)   Pulse 95   Ht 5\' 5"  (1.651 m)   Wt 202 lb 1.6 oz (91.7 kg)   BMI 33.63 kg/m  , BMI Body mass index is 33.63 kg/m. GENERAL:  Well appearing HEENT: Pupils equal round and reactive, fundi not visualized, oral mucosa unremarkable NECK:  No jugular venous distention, waveform within normal limits, carotid upstroke brisk and symmetric, no bruits, no thyromegaly LUNGS:  Clear to auscultation bilaterally HEART:  RRR.  PMI not displaced or sustained,S1 and S2 within normal limits, no S3, no S4, no clicks, no rubs, no murmurs ABD:  Flat, positive bowel sounds normal in frequency in pitch, no bruits, no rebound, no guarding, no midline pulsatile mass, no hepatomegaly, no splenomegaly EXT:  2 plus pulses throughout, no edema, no cyanosis no clubbing SKIN:  No rashes no nodules NEURO:  Cranial nerves II through XII grossly intact, motor grossly intact throughout PSYCH:  Cognitively intact, oriented to person place and time  Wt Readings from Last 3 Encounters:  10/15/22 202 lb 1.6 oz (91.7 kg)  10/07/22 201 lb 12.8 oz (91.5 kg)  10/02/22 199 lb 4 oz (90.4 kg)     ASSESSMENT AND PLAN: .    # Shortness of Breath and Fatigue New onset over the past 3-4 weeks. No associated leg swelling or orthopnea. History of multiple myeloma with autologous stem cell transplant and carcinoid tumor in the lower right lung. -Order echocardiogram to assess for any changes in cardiac function since last echo in 2015. -Order coronary CTA to assess for any coronary artery disease or pulmonary embolism.  # Hypertension Blood pressure readings have been low recently, possibly contributing to fatigue. Currently on Losartan 25mg  and Metoprolol. -Discontinue  Metoprolol. -Continue Losartan 25mg . -Check blood pressure twice daily at home and record readings for follow-up.  # Sleep Apnea History of sleep apnea managed with oral device, which has not been used for the past 6 months. Reports excessive sleep duration and persistent fatigue. -Consider reinitiating use of oral device for sleep apnea management.  Follow-up in 6 weeks with blood pressure log.      Dispo:  FU with Geroge Gilliam C. Duke Salvia, MD, [facility] in 6 weeks.  I,Mathew Stumpf,acting as a Neurosurgeon for Chilton Si, MD.,have documented all relevant documentation on the behalf of Chilton Si, MD,as directed by  Chilton Si, MD while in the presence of Chilton Si, MD.  I, Abbiegail Landgren C. Duke Salvia, MD have reviewed all documentation for this visit.  The documentation of the exam, diagnosis, procedures, and orders on 10/15/2022 are all accurate and complete.   Signed, Chilton Si, MD

## 2022-10-16 ENCOUNTER — Encounter (HOSPITAL_BASED_OUTPATIENT_CLINIC_OR_DEPARTMENT_OTHER): Payer: Self-pay

## 2022-10-21 ENCOUNTER — Encounter (HOSPITAL_BASED_OUTPATIENT_CLINIC_OR_DEPARTMENT_OTHER): Payer: Self-pay

## 2022-10-23 ENCOUNTER — Ambulatory Visit (HOSPITAL_COMMUNITY): Admission: RE | Admit: 2022-10-23 | Payer: No Typology Code available for payment source | Source: Ambulatory Visit

## 2022-10-26 ENCOUNTER — Other Ambulatory Visit: Payer: Self-pay | Admitting: Family Medicine

## 2022-10-26 DIAGNOSIS — R7989 Other specified abnormal findings of blood chemistry: Secondary | ICD-10-CM

## 2022-10-26 DIAGNOSIS — D61818 Other pancytopenia: Secondary | ICD-10-CM

## 2022-10-27 ENCOUNTER — Other Ambulatory Visit (HOSPITAL_COMMUNITY): Payer: Self-pay

## 2022-10-30 DIAGNOSIS — H9042 Sensorineural hearing loss, unilateral, left ear, with unrestricted hearing on the contralateral side: Secondary | ICD-10-CM | POA: Diagnosis not present

## 2022-10-31 ENCOUNTER — Ambulatory Visit: Payer: Medicare Other | Admitting: Clinical

## 2022-11-05 ENCOUNTER — Ambulatory Visit: Payer: Medicare Other | Admitting: Family Medicine

## 2022-11-08 LAB — BASIC METABOLIC PANEL
BUN/Creatinine Ratio: 15 (ref 12–28)
BUN: 12 mg/dL (ref 8–27)
CO2: 24 mmol/L (ref 20–29)
Calcium: 9.8 mg/dL (ref 8.7–10.3)
Chloride: 104 mmol/L (ref 96–106)
Creatinine, Ser: 0.81 mg/dL (ref 0.57–1.00)
Glucose: 135 mg/dL — ABNORMAL HIGH (ref 70–99)
Potassium: 5 mmol/L (ref 3.5–5.2)
Sodium: 141 mmol/L (ref 134–144)
eGFR: 75 mL/min/{1.73_m2} (ref >59)

## 2022-11-10 ENCOUNTER — Other Ambulatory Visit (HOSPITAL_BASED_OUTPATIENT_CLINIC_OR_DEPARTMENT_OTHER): Payer: Self-pay

## 2022-11-10 ENCOUNTER — Other Ambulatory Visit (HOSPITAL_COMMUNITY): Payer: Self-pay

## 2022-11-10 ENCOUNTER — Telehealth (HOSPITAL_COMMUNITY): Payer: Self-pay | Admitting: *Deleted

## 2022-11-10 ENCOUNTER — Ambulatory Visit (HOSPITAL_BASED_OUTPATIENT_CLINIC_OR_DEPARTMENT_OTHER): Payer: Medicare Other

## 2022-11-10 DIAGNOSIS — R0609 Other forms of dyspnea: Secondary | ICD-10-CM | POA: Diagnosis not present

## 2022-11-10 DIAGNOSIS — I272 Pulmonary hypertension, unspecified: Secondary | ICD-10-CM | POA: Diagnosis not present

## 2022-11-10 LAB — ECHOCARDIOGRAM COMPLETE
Area-P 1/2: 4.31 cm2
S' Lateral: 2.18 cm

## 2022-11-10 MED ORDER — PERFLUTREN LIPID MICROSPHERE
1.0000 mL | INTRAVENOUS | Status: AC | PRN
  Administered 2022-11-10: 1 mL via INTRAVENOUS

## 2022-11-10 NOTE — Telephone Encounter (Signed)
Patient returning about her upcoming cardiac imaging study; pt verbalizes understanding of appt date/time, parking situation and where to check in, pre-test NPO status and medications ordered, and verified current allergies; name and call back number provided for further questions should they arise  Larey Brick RN Navigator Cardiac Imaging [facility] [phone] office [phone] cell  Patient to hold her losartan and take 10mg  ivabradine two hours prior to her cardiac CT scan. She is aware to arrive at 12:30 PM

## 2022-11-10 NOTE — Telephone Encounter (Signed)
Attempted to call patient regarding upcoming cardiac CT appointment. Left message on voicemail with name and callback number Hayley Sharpe RN Navigator Cardiac Imaging [facility] [phone] Office   

## 2022-11-11 ENCOUNTER — Ambulatory Visit (HOSPITAL_COMMUNITY)
Admission: RE | Admit: 2022-11-11 | Discharge: 2022-11-11 | Disposition: A | Discharge status: Home / Self Care | Payer: Medicare Other | Source: Ambulatory Visit | Attending: Cardiovascular Disease | Admitting: Cardiovascular Disease

## 2022-11-11 DIAGNOSIS — I272 Pulmonary hypertension, unspecified: Secondary | ICD-10-CM | POA: Insufficient documentation

## 2022-11-11 DIAGNOSIS — R0609 Other forms of dyspnea: Secondary | ICD-10-CM | POA: Diagnosis present

## 2022-11-11 MED ORDER — DILTIAZEM HCL 25 MG/5ML IV SOLN: 10.0000 mg | INTRAVENOUS | Status: DC | PRN

## 2022-11-11 MED ORDER — NITROGLYCERIN 0.4 MG SL SUBL
SUBLINGUAL_TABLET | SUBLINGUAL | Status: AC
  Filled 2022-11-11: qty 2

## 2022-11-11 MED ORDER — METOPROLOL TARTRATE 5 MG/5ML IV SOLN
10.0000 mg | INTRAVENOUS | Status: DC | PRN
  Administered 2022-11-11: 10 mg via INTRAVENOUS

## 2022-11-11 MED ORDER — NITROGLYCERIN 0.4 MG SL SUBL
0.8000 mg | SUBLINGUAL_TABLET | Freq: Once | SUBLINGUAL | Status: AC
  Administered 2022-11-11: 0.8 mg via SUBLINGUAL

## 2022-11-11 MED ORDER — METOPROLOL TARTRATE 5 MG/5ML IV SOLN
INTRAVENOUS | Status: AC
  Filled 2022-11-11: qty 10

## 2022-11-11 MED ORDER — IOHEXOL 350 MG/ML SOLN
95.0000 mL | Freq: Once | INTRAVENOUS | Status: AC | PRN
  Administered 2022-11-11: 95 mL via INTRAVENOUS

## 2022-11-11 MED ORDER — METOPROLOL TARTRATE 5 MG/5ML IV SOLN
INTRAVENOUS | Status: AC
  Filled 2022-11-11: qty 5

## 2022-11-14 ENCOUNTER — Ambulatory Visit (INDEPENDENT_AMBULATORY_CARE_PROVIDER_SITE_OTHER): Payer: Medicare Other | Admitting: Clinical

## 2022-11-14 DIAGNOSIS — F4323 Adjustment disorder with mixed anxiety and depressed mood: Secondary | ICD-10-CM | POA: Diagnosis not present

## 2022-11-14 NOTE — Progress Notes (Signed)
Time: 11:00am-12:00pm CPT Code: 78469G Diagnosis:  F43.23  Olivianna was seen in person for individual therapy. She reflected upon the sense that thoughts about her marriage are repetitive in nature, and she would like to disengage from them. She had completed her homework of listing her top 10 values, and shared that she believes her life is currently in line with these values. For homework, she will try getting out and being more active, and see if this helps reduce the frequency of repetitive thoughts. She is scheduled to be seen again in two weeks.  Treatment Plan Client Abilities/Strengths  Dotti described herself as perserverant, receptive to suggestions.  Client Treatment Preferences: Sharima shared that she prefers afternoon appointments, and would like to alternate virtual vs in person sessions.  Client Statement of Needs  Marion shared that she would like to process past events in order to reduce their impact on her current functioning and move forward. She shared concerns that she may have symptoms of PTSD. Treatment Level   Violette would like to start with weekly appointments  Symptoms  Savi shared that she self-medicates with alcohol and drinks more than she would like to (up to a bottle of wine per day), fatigue, lack of purpose (which she attributes in part to limitations resulting from cancer) Problems Addressed  Cloe would like to address and reduce the frequency and intensity of memories of troubling experiences.  Goals 1. Nakya would like to process past events in order to move forward in her life as it is now Objective Merrie would like to feel less bothered and controlled by past events   Target Date: 05/22/2023 Frequency: Weekly  Progress: 0 Modality: Individual   Related Interventions Aleiyah will be provided with opportunities to process experiences in session Therapist to notice and disengage from maladaptive thoughts and behaviors using CBT based strategies  Crysti will process events  from her past and explore their impact on her functioning Therapist will provide referrals for additional resources as appropriate     Chrissie Noa, PhD               Chrissie Noa, PhD

## 2022-11-17 ENCOUNTER — Encounter (INDEPENDENT_AMBULATORY_CARE_PROVIDER_SITE_OTHER): Payer: Medicare Other | Admitting: Ophthalmology

## 2022-11-17 DIAGNOSIS — H43813 Vitreous degeneration, bilateral: Secondary | ICD-10-CM | POA: Diagnosis not present

## 2022-11-17 DIAGNOSIS — H35033 Hypertensive retinopathy, bilateral: Secondary | ICD-10-CM

## 2022-11-17 DIAGNOSIS — H353231 Exudative age-related macular degeneration, bilateral, with active choroidal neovascularization: Secondary | ICD-10-CM

## 2022-11-17 DIAGNOSIS — I10 Essential (primary) hypertension: Secondary | ICD-10-CM

## 2022-11-18 ENCOUNTER — Ambulatory Visit: Payer: Medicare Other | Admitting: Family Medicine

## 2022-11-18 ENCOUNTER — Encounter: Payer: Self-pay | Admitting: Family Medicine

## 2022-11-18 VITALS — BP 110/52 | HR 100 | Temp 97.7°F | Resp 18 | Ht 65.0 in | Wt 203.1 lb

## 2022-11-18 DIAGNOSIS — Z7984 Long term (current) use of oral hypoglycemic drugs: Secondary | ICD-10-CM | POA: Diagnosis not present

## 2022-11-18 DIAGNOSIS — D220 Melanocytic nevi of lip: Secondary | ICD-10-CM | POA: Diagnosis not present

## 2022-11-18 DIAGNOSIS — E119 Type 2 diabetes mellitus without complications: Secondary | ICD-10-CM | POA: Diagnosis not present

## 2022-11-18 DIAGNOSIS — F411 Generalized anxiety disorder: Secondary | ICD-10-CM

## 2022-11-18 DIAGNOSIS — Z23 Encounter for immunization: Secondary | ICD-10-CM | POA: Diagnosis not present

## 2022-11-18 DIAGNOSIS — I10 Essential (primary) hypertension: Secondary | ICD-10-CM

## 2022-11-18 DIAGNOSIS — D485 Neoplasm of uncertain behavior of skin: Secondary | ICD-10-CM | POA: Diagnosis not present

## 2022-11-18 NOTE — Assessment & Plan Note (Signed)
Chronic.  Fair control  continue Wellbutrin xl 150 mg daily, prazosin 1 mg at night-time, Effexor XR 150 mg daily.  Just started counseling.

## 2022-11-18 NOTE — Progress Notes (Signed)
Subjective:     Patient ID: [name], female    DOB: [birth year], 77 y.o.   MRN: [id number]  Chief Complaint  Patient presents with   Medical Management of Chronic Issues    4 week follow-up on htn    HPI HTN - Pt is on losartan 25 mg daily. Cardiology stopped  Metoprolol.  Bp's running not checked. No ha/dizziness/cp/palp/edema/cough/sob. Bp at initial check was 131/81. Bp at recheck was 110/52.   DM Type 2 - Pt is on metformin 500 mg 3x a day. Sugars are not checked. Reports she is not regularly exercising.   Vision - Reports she received injections in her eyes for macular degeneration on 10/21 from Dr. Ashley Royalty, is doing well.  Moods - States since her last visit she has seen Dr. Dewayne Hatch again to manage anxiety/depression. Is on Wellbutrin 150 mg and effexor xr 150 mg  daily and responding well.  Prazosin 1mg  at hs doing well.  No SI  Punch biopsy on 10/22 (today) on right side of her face above her lip before her visit.   There are no preventive care reminders to display for this patient.   Past Medical History:  Diagnosis Date   Anxiety    Arthritis    Blood transfusion without reported diagnosis    Cataract    Cough 01/11/2014   Depression    Diabetes type 2, controlled (HCC)    Esophageal reflux disease    Hypercholesterolemia    Hypertension    Ischemic colitis (HCC) 01/28/2007   Liver enzyme elevation    Lung nodule 06/11/2012   Macular degeneration, wet (HCC)    Multiple myeloma (HCC) 05/27/2012   Cytogenetics on 06/08/2012 was normal 46, XX [20]   Osteoporosis    Other fatigue 02/08/2014   Oxygen deficiency    Pancytopenia (HCC)    Pneumonia    Post-nasal drip 01/11/2014   Sepsis (HCC)    Shortness of breath 10/15/2022   Sleep apnea    does not use CPAP   Tuberculosis     (+ TB TEST)YEARS AGO POSSIBLE EXPOSURE WAS MED TX    Type 2 diabetes mellitus without complications (HCC) 01/31/2014    Past Surgical History:  Procedure Laterality Date    APPENDECTOMY  1998   COLONOSCOPY  01/28/2011   poly; Dr. Randa Evens, next one due 2018.l   LEG SURGERY     OPEN REDUCTION INTERNAL FIXATION (ORIF) HAND Left 04/29/2022   Procedure: OPEN REDUCTION INTERNAL FIXATION with percutaneous pinning, left ring finger proximal phalanx;  Surgeon: Gomez Cleverly, MD;  Location: [facility];  Service: Orthopedics;  Laterality: Left;  mac and regional   Stem cell  2015   TONSILLECTOMY     VIDEO BRONCHOSCOPY WITH ENDOBRONCHIAL NAVIGATION Right 07/08/2012   Procedure: VIDEO BRONCHOSCOPY WITH ENDOBRONCHIAL NAVIGATION;  Surgeon: Leslye Peer, MD;  Location: [facility] OR;  Service: Thoracic;  Laterality: Right;     Current Outpatient Medications:    Blood Glucose Monitoring Suppl (ONETOUCH VERIO FLEX SYSTEM) w/Device KIT, 1 each by Does not apply route daily at 12 noon., Disp: 1 kit, Rfl: 1   buPROPion (WELLBUTRIN XL) 150 MG 24 hr tablet, Take 150 mg by mouth every morning., Disp: , Rfl:    cholecalciferol (VITAMIN D) 1000 UNITS tablet, Take 2,000 Units by mouth daily., Disp: , Rfl:    glucose blood (ONETOUCH VERIO) test strip, 1 each by Other route daily at 12 noon., Disp: 100 each, Rfl: 3  Lancets (ONETOUCH ULTRASOFT) lancets, 1 each by Other route daily at 12 noon. Use as instructed, Disp: 100 each, Rfl: 3   losartan (COZAAR) 25 MG tablet, Take 25 mg by mouth daily., Disp: , Rfl:    magnesium chloride (SLOW-MAG) 64 MG TBEC SR tablet, Take 1 tablet by mouth 2 (two) times daily. , Disp: , Rfl:    metFORMIN (GLUCOPHAGE) 500 MG tablet, TAKE 1 IN THE MORNING AND 2 IN THE EVENING BY MOUTH, Disp: 270 tablet, Rfl: 1   Multiple Vitamins-Minerals (MULTIVITAMIN WITH MINERALS) tablet, Take 1 tablet by mouth at bedtime. , Disp: , Rfl:    Multiple Vitamins-Minerals (PRESERVISION AREDS 2) CAPS, Take 1 capsule by mouth 2 (two) times daily., Disp: , Rfl:    prazosin (MINIPRESS) 1 MG capsule, TAKE 1 CAPSULE BY MOUTH EVERYDAY AT BEDTIME, Disp: 90 capsule, Rfl: 1    simvastatin (ZOCOR) 40 MG tablet, TAKE 1 TABLET BY MOUTH AT  BEDTIME, Disp: 90 tablet, Rfl: 3   triamcinolone cream (KENALOG) 0.1 %, Apply topically 2 (two) times daily., Disp: , Rfl:    trimethoprim-polymyxin b (POLYTRIM) ophthalmic solution, Place into both eyes., Disp: , Rfl:    venlafaxine XR (EFFEXOR-XR) 150 MG 24 hr capsule, TAKE 1 CAPSULE BY MOUTH EVERY DAY WITH FOOD FOR 30 DAYS, Disp: 90 capsule, Rfl: 1  No Known Allergies ROS neg/noncontributory except as noted HPI/below    Objective:     BP (!) 110/52 (Patient Position: Sitting)   Pulse 100   Temp 97.7 F (36.5 C) (Temporal)   Resp 18   Ht 5\' 5"  (1.651 m)   Wt 203 lb 2 oz (92.1 kg)   SpO2 96%   BMI 33.80 kg/m  Wt Readings from Last 3 Encounters:  11/18/22 203 lb 2 oz (92.1 kg)  10/15/22 202 lb 1.6 oz (91.7 kg)  10/07/22 201 lb 12.8 oz (91.5 kg)    Physical Exam   Gen: WDWN NAD HEENT: NCAT, conjunctiva not injected, sclera nonicteric, +Band-Aid on right side of face NECK:  supple, no thyromegaly, no nodes, no carotid bruits CARDIAC: RRR, S1S2+, DP 1+B +1/6 systolic murmur, tachycardic  LUNGS: CTAB. No wheezes ABDOMEN:  BS+, soft, NTND, No HSM, no masses EXT:  no edema MSK: no gross abnormalities.  NEURO: A&O x3.  CN II-XII intact.  PSYCH: normal mood. Good eye contact    Assessment & Plan:  Type 2 diabetes mellitus without complication, without long-term current use of insulin (HCC) Assessment & Plan: Chronic.  Well-controlled.  Continue metformin 500 mg for total of 3 split up throughout the day.  Advised to check sugars several times a week.  Different times of the day.  She is on an ARB and a statin.  Ophth UTD   Essential hypertension Assessment & Plan: Chronic.  Controlled.  Continue cozaar 25mg  daily.  Some tachycardia off metoprolol-logging daily bp for card.  Advised to log pulse as well.  May need to restart vs tachycardia today from bx, etc    GAD (generalized anxiety disorder) Assessment &  Plan: Chronic.  Fair control  continue Wellbutrin xl 150 mg daily, prazosin 1 mg at night-time, Effexor XR 150 mg daily.  Just started counseling.      Return in about 3 months (around 02/18/2023) for HTN, DM.  Melina Fiddler N Rice,acting as a scribe for Angelena Sole, MD.,have documented all relevant documentation on the behalf of Angelena Sole, MD,as directed by  Angelena Sole, MD while in the presence of Ruffin Frederick  Ruthine Dose, MD.  I, Angelena Sole, MD, have reviewed all documentation for this visit. The documentation on 11/18/22 for the exam, diagnosis, procedures, and orders are all accurate and complete.   Angelena Sole, MD

## 2022-11-18 NOTE — Assessment & Plan Note (Signed)
Chronic.  Controlled.  Continue cozaar 25mg  daily.  Some tachycardia off metoprolol-logging daily bp for card.  Advised to log pulse as well.  May need to restart vs tachycardia today from bx, etc

## 2022-11-18 NOTE — Patient Instructions (Addendum)
It was very nice to see you today!  Check sugars at least every 2 weeks.  Check bp at least every 2 wks.and pulse Exercise daily-at least 5 minutes/day and increase from there.    PLEASE NOTE:  If you had any lab tests please let us know if you have not heard back within a few days. You may see your results on MyChart before we have a chance to review them but we will give you a call once they are reviewed by [place]. If we ordered any referrals today, please let us know if you have not heard from their office within the next week.   Please try these tips to maintain a healthy lifestyle:  Eat most of your calories during the day when you are active. Eliminate processed foods including packaged sweets (pies, cakes, cookies), reduce intake of potatoes, white bread, white pasta, and white rice. Look for whole grain options, oat flour or almond flour.  Each meal should contain half fruits/vegetables, one quarter protein, and one quarter carbs (no bigger than a computer mouse).  Cut down on sweet beverages. This includes juice, soda, and sweet tea. Also watch fruit intake, though this is a healthier sweet option, it still contains natural sugar! Limit to 3 servings daily.  Drink at least 1 glass of water with each meal and aim for at least 8 glasses per day  Exercise at least 150 minutes every week.

## 2022-11-18 NOTE — Assessment & Plan Note (Addendum)
Chronic.  Well-controlled.  Continue metformin 500 mg for total of 3 split up throughout the day.  Advised to check sugars several times a week.  Different times of the day.  She is on an ARB and a statin.  Ophth UTD

## 2022-11-28 ENCOUNTER — Ambulatory Visit: Payer: Medicare Other | Admitting: Clinical

## 2022-12-06 ENCOUNTER — Encounter (HOSPITAL_COMMUNITY): Payer: Self-pay

## 2022-12-06 ENCOUNTER — Ambulatory Visit (HOSPITAL_COMMUNITY)
Admission: EM | Admit: 2022-12-06 | Discharge: 2022-12-06 | Disposition: A | Discharge status: Home / Self Care | Payer: Medicare Other | Attending: Family Medicine | Admitting: Family Medicine

## 2022-12-06 DIAGNOSIS — R000 Tachycardia, unspecified: Secondary | ICD-10-CM | POA: Diagnosis not present

## 2022-12-06 DIAGNOSIS — I10 Essential (primary) hypertension: Secondary | ICD-10-CM | POA: Diagnosis not present

## 2022-12-06 LAB — BASIC METABOLIC PANEL
Anion gap: 9 (ref 5–15)
BUN: 21 mg/dL (ref 8–23)
CO2: 22 mmol/L (ref 22–32)
Calcium: 9.2 mg/dL (ref 8.9–10.3)
Chloride: 106 mmol/L (ref 98–111)
Creatinine, Ser: 1.05 mg/dL — ABNORMAL HIGH (ref 0.44–1.00)
GFR, Estimated: 55 mL/min — ABNORMAL LOW (ref >60)
Glucose, Bld: 121 mg/dL — ABNORMAL HIGH (ref 70–99)
Potassium: 4.6 mmol/L (ref 3.5–5.1)
Sodium: 137 mmol/L (ref 135–145)

## 2022-12-06 NOTE — ED Provider Notes (Signed)
[facility]    CSN: [id number] Arrival date & time: 12/06/22  1336      History   Chief Complaint Chief Complaint  Patient presents with   Tachycardia   Hypertension    HPI Kristin Bullock is a 77 y.o. female.    Hypertension  Here for heart racing and elevated blood pressure. Noted it last night. Her jaw felt tight when this happened. When this was going on, her bp at home was 154/113. HR was over 100.  Episode lasted maybe 1/2-hour.  She also was may be a little diaphoretic during the episode.  No chest pain and no shortness of breath.  No episode since.  Had been taken off metoprolol when was having bp be too low. Still taking losartan    Past Medical History:  Diagnosis Date   Anxiety    Arthritis    Blood transfusion without reported diagnosis    Cataract    Cough 01/11/2014   Depression    Diabetes type 2, controlled (HCC)    Esophageal reflux disease    Hypercholesterolemia    Hypertension    Ischemic colitis (HCC) 01/28/2007   Liver enzyme elevation    Lung nodule 06/11/2012   Macular degeneration, wet (HCC)    Multiple myeloma (HCC) 05/27/2012   Cytogenetics on 06/08/2012 was normal 46, XX [20]   Osteoporosis    Other fatigue 02/08/2014   Oxygen deficiency    Pancytopenia (HCC)    Pneumonia    Post-nasal drip 01/11/2014   Sepsis (HCC)    Shortness of breath 10/15/2022   Sleep apnea    does not use CPAP   Tuberculosis     (+ TB TEST)YEARS AGO POSSIBLE EXPOSURE WAS MED TX    Type 2 diabetes mellitus without complications (HCC) 01/31/2014    Patient Active Problem List   Diagnosis Date Noted   Shortness of breath 10/15/2022   Sleep apnea    GAD (generalized anxiety disorder) 12/25/2021   Anemia, chronic disease 09/14/2020   Obesity (BMI 30.0-34.9) 03/13/2017   Osteopenia due to cancer therapy 09/12/2016   Dysuria 06/12/2016   Pancytopenia, acquired (HCC) 07/17/2015   Quality of life palliative care encounter 07/17/2015    Acquired pancytopenia (HCC) 04/10/2015   Preventive measure 10/23/2014   Chronic kidney disease, stage III (moderate) (HCC) 08/23/2014   Hypotension due to drugs 08/23/2014   Leukopenia due to antineoplastic chemotherapy (HCC) 07/27/2014   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/2014   Rash 05/03/2014   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   Other fatigue 02/08/2014   Type 2 diabetes mellitus without complications (HCC) 01/31/2014   Post-nasal drip 01/11/2014   Cough 01/11/2014   H/O autologous stem cell transplant (HCC) 05/13/2013   Status post lobectomy of lung 03/13/2013   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   Obesity 11/08/2012   Multiple myeloma in remission (HCC) 10/11/2012   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  Edwards, next one due 2018.l   LEG SURGERY     OPEN REDUCTION INTERNAL FIXATION (ORIF) HAND Left 04/29/2022   Procedure: OPEN REDUCTION INTERNAL FIXATION with percutaneous pinning, left ring finger proximal phalanx;  Surgeon: Gomez Cleverly, MD;  Location: [facility];  Service: Orthopedics;  Laterality: Left;  mac and regional   Stem cell  2015   TONSILLECTOMY     VIDEO BRONCHOSCOPY WITH ENDOBRONCHIAL NAVIGATION Right 07/08/2012   Procedure: VIDEO BRONCHOSCOPY WITH ENDOBRONCHIAL NAVIGATION;  Surgeon: Leslye Peer, MD;  Location: [facility];  Service: Thoracic;  Laterality: Right;    OB History    No obstetric history on file.      Home Medications    Prior to Admission medications   Medication Sig Start Date End Date Taking? Authorizing Provider  losartan (COZAAR) 25 MG tablet Take 25 mg by mouth daily.   Yes [provider]  Blood Glucose Monitoring Suppl (ONETOUCH VERIO FLEX SYSTEM) w/Device KIT 1 each by Does not apply route daily at 12 noon. 09/02/22   Jeani Sow, MD  buPROPion (WELLBUTRIN XL) 150 MG 24 hr tablet Take 150 mg by mouth every morning.    [provider]  cholecalciferol (VITAMIN D) 1000 UNITS tablet Take 2,000 Units by mouth daily.    [provider]  glucose blood (ONETOUCH VERIO) test strip 1 each by Other route daily at 12 noon. 09/02/22   Jeani Sow, MD  Lancets [facility] ULTRASOFT) lancets 1 each by Other route daily at 12 noon. Use as instructed 09/02/22   Jeani Sow, MD  magnesium chloride (SLOW-MAG) 64 MG TBEC SR tablet Take 1 tablet by mouth 2 (two) times daily.     [provider]  metFORMIN (GLUCOPHAGE) 500 MG tablet TAKE 1 IN THE MORNING AND 2 IN THE EVENING BY MOUTH 07/16/22   Jeani Sow, MD  Multiple Vitamins-Minerals (MULTIVITAMIN WITH MINERALS) tablet Take 1 tablet by mouth at bedtime.     [provider]  Multiple Vitamins-Minerals (PRESERVISION AREDS 2) CAPS Take 1 capsule by mouth 2 (two) times daily.    [provider]  prazosin (MINIPRESS) 1 MG capsule TAKE 1 CAPSULE BY MOUTH EVERYDAY AT BEDTIME 07/22/22   Jeani Sow, MD  simvastatin (ZOCOR) 40 MG tablet TAKE 1 TABLET BY MOUTH AT  BEDTIME 10/27/22   Jeani Sow, MD  triamcinolone cream (KENALOG) 0.1 % Apply topically 2 (two) times daily. 10/06/22   [provider]  trimethoprim-polymyxin b (POLYTRIM) ophthalmic solution Place into both eyes. 06/13/19   [provider]  venlafaxine XR (EFFEXOR-XR) 150 MG 24 hr capsule TAKE 1 CAPSULE BY MOUTH EVERY DAY WITH FOOD FOR 30 DAYS 07/22/22   Jeani Sow, MD     Family History Family History  Problem Relation Age of Onset   Stroke Mother    Hearing loss Mother    Diabetes Mother    Anxiety disorder Mother    Alcohol abuse Mother    Early death Father    Depression Father    Alcohol abuse Father    Heart attack Father    Early death Sister    Depression Sister    Alcohol abuse Sister    Cancer Sister        synovial sarcoma   Hypertension Brother    Hyperlipidemia Brother    Stroke Brother    Depression Brother    Alcohol abuse Brother    Cancer Maternal Grandfather        ?  cancer    Social History Social History   Tobacco Use   Smoking status: Former    Current packs/day: 0.00    Average packs/day: 1 pack/day for 30.0 years (30.0 ttl pk-yrs)    Types: Cigarettes    Start date: 01/28/1979    Quit date: 01/27/2009    Years since quitting: 13.8   Smokeless tobacco: Never  Vaping Use   Vaping status: Never Used  Substance Use Topics   Alcohol use: Yes    Comment: drinks daily wine or mixed drink daily   Drug use: No     Allergies   Patient has no known allergies.   Review of Systems Review of Systems   Physical Exam Triage Vital Signs ED Triage Vitals  Encounter Vitals Group     BP 12/06/22 1353 129/69     Systolic BP Percentile --      Diastolic BP Percentile --      Pulse Rate 12/06/22 1353 94     Resp 12/06/22 1353 20     Temp 12/06/22 1353 97.8 F (36.6 C)     Temp Source 12/06/22 1353 Oral     SpO2 12/06/22 1353 100 %     Weight 12/06/22 1352 203 lb 0.7 oz (92.1 kg)     Height 12/06/22 1352 5\' 5"  (1.651 m)     Head Circumference --      Peak Flow --      Pain Score 12/06/22 1348 0     Pain Loc --      Pain Education --      Exclude from Growth Chart --    No data found.  Updated Vital Signs BP 129/69 (BP Location: Left Arm)   Pulse 94   Temp 97.8 F (36.6 C) (Oral)   Resp 20   Ht 5\' 5"  (1.651 m)   Wt 92.1 kg   SpO2 100%   BMI 33.79 kg/m   Visual Acuity Right Eye Distance:   Left  Eye Distance:   Bilateral Distance:    Right Eye Near:   Left Eye Near:    Bilateral Near:     Physical Exam   UC Treatments / Results  Labs (all labs ordered are listed, but only abnormal results are displayed) Labs Reviewed  BASIC METABOLIC PANEL    EKG   Radiology No results found.  Procedures Procedures (including critical care time)  Medications Ordered in UC Medications - No data to display  Initial Impression / Assessment and Plan / UC Course  I have reviewed the triage vital signs and the nursing notes.  Pertinent labs & imaging results that were available during my care of the patient were reviewed by me and considered in my medical decision making (see chart for details).     Here in the clinic her heart rate is measured at 94 at triage.  Blood pressure is 129/69.  Her EKG shows a heart rate of about 100 this normal sinus rhythm.  No ST changes and no ectopic beats  BMP is drawn today to assess for electrolyte abnormalities, and we will let her know if she has any significant abnormalities on that  She will continue to monitor her blood pressure about once a day or more often if she is having any referable symptoms.  She will follow-up with her primary care and/or cardiology about this issue.  If she has any further trouble with the above-noted symptoms and it lasts more than 5 minutes, I have  asked her to proceed to the emergency room for urgent evaluation Final Clinical Impressions(s) / UC Diagnoses   Final diagnoses:  Tachycardia  Essential hypertension     Discharge Instructions      Your EKG showed a normal rhythm.  We have drawn blood to check your electrolytes and sugar.  Staff notified if anything is significantly abnormal  Please follow-up with your primary care and/or your cardiologist soon about this issue.  If this happens any more and last more than 5 minutes, please consider going to the emergency room for urgent  evaluation     ED Prescriptions   None    PDMP not reviewed this encounter.   Zenia Resides, MD 12/06/22 [phone]

## 2022-12-06 NOTE — ED Provider Notes (Signed)
[facility]    CSN: [id number] Arrival date & time: 12/06/22  1336      History   Chief Complaint Chief Complaint  Patient presents with   Tachycardia   Hypertension    HPI [name] is a 77 y.o. female.    Hypertension  Here for heart racing and elevated blood pressure. Noted it last night. Her jaw felt tight when this happened. When this was going on, her bp at home was 154/113. HR was over 100.  Episode lasted maybe 1/2-hour.  She also was may be a little diaphoretic during the episode.  No chest pain and no shortness of breath.  No episode since.  Had been taken off metoprolol when was having bp be too low. Still taking losartan    Past Medical History:  Diagnosis Date   Anxiety    Arthritis    Blood transfusion without reported diagnosis    Cataract    Cough 01/11/2014   Depression    Diabetes type 2, controlled (HCC)    Esophageal reflux disease    Hypercholesterolemia    Hypertension    Ischemic colitis (HCC) 01/28/2007   Liver enzyme elevation    Lung nodule 06/11/2012   Macular degeneration, wet (HCC)    Multiple myeloma (HCC) 05/27/2012   Cytogenetics on 06/08/2012 was normal 46, XX [20]   Osteoporosis    Other fatigue 02/08/2014   Oxygen deficiency    Pancytopenia (HCC)    Pneumonia    Post-nasal drip 01/11/2014   Sepsis (HCC)    Shortness of breath 10/15/2022   Sleep apnea    does not use CPAP   Tuberculosis     (+ TB TEST)YEARS AGO POSSIBLE EXPOSURE WAS MED TX    Type 2 diabetes mellitus without complications (HCC) 01/31/2014    Patient Active Problem List   Diagnosis Date Noted   Shortness of breath 10/15/2022   Sleep apnea    GAD (generalized anxiety disorder) 12/25/2021   Anemia, chronic disease 09/14/2020   Obesity (BMI 30.0-34.9) 03/13/2017   Osteopenia due to cancer therapy 09/12/2016   Dysuria 06/12/2016   Pancytopenia, acquired (HCC) 07/17/2015   Quality of life palliative care encounter 07/17/2015    Acquired pancytopenia (HCC) 04/10/2015   Preventive measure 10/23/2014   Chronic kidney disease, stage III (moderate) (HCC) 08/23/2014   Hypotension due to drugs 08/23/2014   Leukopenia due to antineoplastic chemotherapy (HCC) 07/27/2014   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/2014   Rash 05/03/2014   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   Other fatigue 02/08/2014   Type 2 diabetes mellitus without complications (HCC) 01/31/2014   Post-nasal drip 01/11/2014   Cough 01/11/2014   H/O autologous stem cell transplant (HCC) 05/13/2013   Status post lobectomy of lung 03/13/2013   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   Obesity 11/08/2012   Multiple myeloma in remission (HCC) 10/11/2012   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  Edwards, next one due 2018.l   LEG SURGERY     OPEN REDUCTION INTERNAL FIXATION (ORIF) HAND Left 04/29/2022   Procedure: OPEN REDUCTION INTERNAL FIXATION with percutaneous pinning, left ring finger proximal phalanx;  Surgeon: Gomez Cleverly, MD;  Location: [facility];  Service: Orthopedics;  Laterality: Left;  mac and regional   Stem cell  2015   TONSILLECTOMY     VIDEO BRONCHOSCOPY WITH ENDOBRONCHIAL NAVIGATION Right 07/08/2012   Procedure: VIDEO BRONCHOSCOPY WITH ENDOBRONCHIAL NAVIGATION;  Surgeon: Leslye Peer, MD;  Location: [facility];  Service: Thoracic;  Laterality: Right;    OB History    No obstetric history on file.      Home Medications    Prior to Admission medications   Medication Sig Start Date End Date Taking? Authorizing Provider  losartan (COZAAR) 25 MG tablet Take 25 mg by mouth daily.   Yes [provider]  Blood Glucose Monitoring Suppl (ONETOUCH VERIO FLEX SYSTEM) w/Device KIT 1 each by Does not apply route daily at 12 noon. 09/02/22   Jeani Sow, MD  buPROPion (WELLBUTRIN XL) 150 MG 24 hr tablet Take 150 mg by mouth every morning.    [provider]  cholecalciferol (VITAMIN D) 1000 UNITS tablet Take 2,000 Units by mouth daily.    [provider]  glucose blood (ONETOUCH VERIO) test strip 1 each by Other route daily at 12 noon. 09/02/22   Jeani Sow, MD  Lancets [facility] ULTRASOFT) lancets 1 each by Other route daily at 12 noon. Use as instructed 09/02/22   Jeani Sow, MD  magnesium chloride (SLOW-MAG) 64 MG TBEC SR tablet Take 1 tablet by mouth 2 (two) times daily.     [provider]  metFORMIN (GLUCOPHAGE) 500 MG tablet TAKE 1 IN THE MORNING AND 2 IN THE EVENING BY MOUTH 07/16/22   Jeani Sow, MD  Multiple Vitamins-Minerals (MULTIVITAMIN WITH MINERALS) tablet Take 1 tablet by mouth at bedtime.     [provider]  Multiple Vitamins-Minerals (PRESERVISION AREDS 2) CAPS Take 1 capsule by mouth 2 (two) times daily.    [provider]  prazosin (MINIPRESS) 1 MG capsule TAKE 1 CAPSULE BY MOUTH EVERYDAY AT BEDTIME 07/22/22   Jeani Sow, MD  simvastatin (ZOCOR) 40 MG tablet TAKE 1 TABLET BY MOUTH AT  BEDTIME 10/27/22   Jeani Sow, MD  triamcinolone cream (KENALOG) 0.1 % Apply topically 2 (two) times daily. 10/06/22   [provider]  trimethoprim-polymyxin b (POLYTRIM) ophthalmic solution Place into both eyes. 06/13/19   [provider]  venlafaxine XR (EFFEXOR-XR) 150 MG 24 hr capsule TAKE 1 CAPSULE BY MOUTH EVERY DAY WITH FOOD FOR 30 DAYS 07/22/22   Jeani Sow, MD     Family History Family History  Problem Relation Age of Onset   Stroke Mother    Hearing loss Mother    Diabetes Mother    Anxiety disorder Mother    Alcohol abuse Mother    Early death Father    Depression Father    Alcohol abuse Father    Heart attack Father    Early death Sister    Depression Sister    Alcohol abuse Sister    Cancer Sister        synovial sarcoma   Hypertension Brother    Hyperlipidemia Brother    Stroke Brother    Depression Brother    Alcohol abuse Brother    Cancer Maternal Grandfather        ?  cancer    Social History Social History   Tobacco Use   Smoking status: Former    Current packs/day: 0.00    Average packs/day: 1 pack/day for 30.0 years (30.0 ttl pk-yrs)    Types: Cigarettes    Start date: 01/28/1979    Quit date: 01/27/2009    Years since quitting: 13.8   Smokeless tobacco: Never  Vaping Use   Vaping status: Never Used  Substance Use Topics   Alcohol use: Yes    Comment: drinks daily wine or mixed drink daily   Drug use: No     Allergies   Patient has no known allergies.   Review of Systems Review of Systems   Physical Exam Triage Vital Signs ED Triage Vitals  Encounter Vitals Group     BP 12/06/22 1353 129/69     Systolic BP Percentile --      Diastolic BP Percentile --      Pulse Rate 12/06/22 1353 94     Resp 12/06/22 1353 20     Temp 12/06/22 1353 97.8 F (36.6 C)     Temp Source 12/06/22 1353 Oral     SpO2 12/06/22 1353 100 %     Weight 12/06/22 1352 203 lb 0.7 oz (92.1 kg)     Height 12/06/22 1352 5\' 5"  (1.651 m)     Head Circumference --      Peak Flow --      Pain Score 12/06/22 1348 0     Pain Loc --      Pain Education --      Exclude from Growth Chart --    No data found.  Updated Vital Signs BP 129/69 (BP Location: Left Arm)   Pulse 94   Temp 97.8 F (36.6 C) (Oral)   Resp 20   Ht 5\' 5"  (1.651 m)   Wt 92.1 kg   SpO2 100%   BMI 33.79 kg/m   Visual Acuity Right Eye Distance:   Left  Eye Distance:   Bilateral Distance:    Right Eye Near:   Left Eye Near:    Bilateral Near:     Physical Exam   UC Treatments / Results  Labs (all labs ordered are listed, but only abnormal results are displayed) Labs Reviewed  BASIC METABOLIC PANEL    EKG   Radiology No results found.  Procedures Procedures (including critical care time)  Medications Ordered in UC Medications - No data to display  Initial Impression / Assessment and Plan / UC Course  I have reviewed the triage vital signs and the nursing notes.  Pertinent labs & imaging results that were available during my care of the patient were reviewed by me and considered in my medical decision making (see chart for details).     Here in the clinic her heart rate is measured at 94 at triage.  Blood pressure is 129/69.  Her EKG shows a heart rate of about 100 this normal sinus rhythm.  No ST changes and no ectopic beats  BMP is drawn today to assess for electrolyte abnormalities, and we will let her know if she has any significant abnormalities on that  She will continue to monitor her blood pressure about once a day or more often if she is having any referable symptoms.  She will follow-up with her primary care and/or cardiology about this issue.  If she has any further trouble with the above-noted symptoms and it lasts more than 5 minutes, I have  asked her to proceed to the emergency room for urgent evaluation Final Clinical Impressions(s) / UC Diagnoses   Final diagnoses:  Tachycardia  Essential hypertension     Discharge Instructions      Your EKG showed a normal rhythm.  We have drawn blood to check your electrolytes and sugar.  Staff notified if anything is significantly abnormal  Please follow-up with your primary care and/or your cardiologist soon about this issue.  If this happens any more and last more than 5 minutes, please consider going to the emergency room for urgent  evaluation     ED Prescriptions   None    PDMP not reviewed this encounter.   Zenia Resides, MD 12/06/22 [phone]

## 2022-12-06 NOTE — Discharge Instructions (Addendum)
Your EKG showed a normal rhythm.  We have drawn blood to check your electrolytes and sugar.  Staff notified if anything is significantly abnormal  Please follow-up with your primary care and/or your cardiologist soon about this issue.  If this happens any more and last more than 5 minutes, please consider going to the emergency room for urgent evaluation

## 2022-12-06 NOTE — ED Triage Notes (Signed)
"  I was having low BP and tired and was referred to a cardiologist for this and said nothing was wrong. I was then taken off Metoprolo". Back to "last night" my heart started racing and my BP was 135/113 and HR was over 100. "I spoke to my PCP office who recommended UC follow up even if resolved". Note "at the time my jaw's were tight but that went away". No chest pain. No sob.

## 2022-12-08 ENCOUNTER — Telehealth: Payer: Self-pay | Admitting: Family Medicine

## 2022-12-08 NOTE — Telephone Encounter (Signed)
Patient scheduled with pcp on 12/09/2022 at 8:00 AM

## 2022-12-08 NOTE — Telephone Encounter (Signed)
Advised to see provider or go to UC. Pt was seen at [facility] on 12/06/22  Patient Name First: [facility] Last: [id number] Gender: Female DOB: 1945/08/07 Age: 77 Y 1 M 21 D Return Phone Number: [phone] (Primary), [phone] (Secondary) Address: City/ State/ Zip: [place]  [address] Client [facility] - Human resources [facility] [employer] Provider Lutricia Horsfall Contact Type Call Who Is Calling Patient / Member / Family / Caregiver Call Type Triage / Clinical Relationship To Patient Self Return Phone Number [phone] (Primary) Chief Complaint Blood Pressure High Reason for Call Symptomatic / Request for Health Information Initial Comment Caller is monitoring her BP and its went up high 165/113. Her HR is 94. Her BP was low for a while and she was referred to a cardiologist and taken off one of her BP medications but she has a BP med that she takes at night. She isnt sure what to do Translation No Nurse Assessment Nurse: Kizzie Bane, RN, Marylene Land Date/Time Lamount Cohen Time): 12/05/2022 7:27:28 PM Confirm and document reason for call. If symptomatic, describe symptoms. ---Caller states her blood pressure is 164/113 and her heart rate is 94. She states her ears feel "funny" Does the patient have any new or worsening symptoms? ---Yes Will a triage be completed? ---Yes Related visit to physician within the last 2 weeks? ---No Does the PT have any chronic conditions? (i.e. diabetes, asthma, this includes High risk factors for pregnancy, etc.) ---Yes List chronic conditions. ---hypertension, diabetes, macular degeneration, multiple myeloma Is this a behavioral health or substance abuse call? ---No Guidelines Guideline Title Affirmed Question Affirmed Notes Nurse Date/Time (Eastern Time) Blood Pressure - High Systolic BP >= 180 OR Diastolic >= [address], RN, Marylene Land 12/05/2022 7:29:19 PM Disp. Time Lamount Cohen Time)  Disposition Final User 12/05/2022 7:36:26 PM See PCP within 24 Hours Yes Kizzie Bane, RN, Marylene Land Final Disposition 12/05/2022 7:36:26 PM See PCP within 24 Hours Yes Kizzie Bane, RN, Rosalyn Charters Disagree/Comply Comply Caller Understands Yes PreDisposition Did not know what to do Care Advice Given Per Guideline SEE PCP WITHIN 24 HOURS: * IF OFFICE WILL BE CLOSED: You need to be seen within the next 24 hours. A clinic or an urgent care center is often a good source of care if your doctor's office is closed or you can't get an appointment. CALL BACK IF: * Weakness or numbness of the face, arm or leg on one side of the body occurs * Chest pain or difficulty breathing occurs * Difficulty walking, difficulty talking, or severe headache occurs * You become worse CARE ADVICE given per Blood Pressure - High (Adult) guideline. Referrals [facility] - UC

## 2022-12-09 ENCOUNTER — Encounter: Payer: Self-pay | Admitting: Family Medicine

## 2022-12-09 ENCOUNTER — Ambulatory Visit (INDEPENDENT_AMBULATORY_CARE_PROVIDER_SITE_OTHER): Payer: Medicare Other | Admitting: Family Medicine

## 2022-12-09 ENCOUNTER — Other Ambulatory Visit: Payer: Self-pay | Admitting: Family Medicine

## 2022-12-09 VITALS — BP 138/82 | HR 104 | Wt 200.2 lb

## 2022-12-09 DIAGNOSIS — I10 Essential (primary) hypertension: Secondary | ICD-10-CM

## 2022-12-09 DIAGNOSIS — D61818 Other pancytopenia: Secondary | ICD-10-CM

## 2022-12-09 DIAGNOSIS — H9122 Sudden idiopathic hearing loss, left ear: Secondary | ICD-10-CM | POA: Diagnosis not present

## 2022-12-09 DIAGNOSIS — R002 Palpitations: Secondary | ICD-10-CM | POA: Diagnosis not present

## 2022-12-09 DIAGNOSIS — H6122 Impacted cerumen, left ear: Secondary | ICD-10-CM | POA: Diagnosis not present

## 2022-12-09 DIAGNOSIS — R7989 Other specified abnormal findings of blood chemistry: Secondary | ICD-10-CM

## 2022-12-09 NOTE — Progress Notes (Signed)
Subjective:     Patient ID: Kristin Bullock, female    DOB: 1945/02/21, 77 y.o.   MRN: [id number]  Chief Complaint  Patient presents with   Palpitations    Went to UC yesterday    HPI  Palpitations -  Pt presented to UC on 11/9 for tachycardia and elevated BP. At the time she reported her sx began the night prior and she stated her jaw felt tight when this occurred. Per UC note, episode lasted 30-60 minutes and BP at home was 154/113. Also endorses diaphoresis during her episode. No cp or sob endorsed at [facility]. At UC her BP was 129/69. EKG revealed a HR of about 100 this normal sinus rhythm. No ST changes and no ectopic beats. BMP was also ordered. Labs unremarkable when compared to historic labs.   Today, she reports an episode of "heart pounding" on Friday night 11/8. She began monitoring her BP closely during this episode. Pt reports this was the first time she experienced these symptoms. She began monitoring her BP and pulse closely that night, reporting BP readings ranging from 159-164/109-113 that night. She presented to the UC the next day (see above). She doesn't regularly drink coffee, but reports drinking about 1 1/4 cups of coffee in a 8-10 oz mug prior to her episode. She makes her coffee at home using 5 scoops of coffee, stating she likes her coffee "strong". Denies any dizziness, chest pain, or shortness of breath at the time of her episode.   She is established with Dr. Chilton Si of cardiology and was last seen on 9/18. Since this visit she stopped taking Metoprolol and was monitoring her BP and pulse at least once a day. Has been taking Losartan 50 mg since stopping Metoprolol. Her October BP log had BP readings ranging from 130-170s/70-90s and pulse ranging 88-115.   Pt brought her BP monitor to this visit, stating she just recently got it and placed new batteries. First readings were BP 174/104 and pulse 102. Second reading was BP 166/99 and pulse 104. When checked with  manual cuff BP was 140/88.  Pulse is correct  Saw pt meds.  She is taking 50mg  losartan but thinking it was 25.   Pt reports she has an appointment with ENT later this afternoon. For hearing   There are no preventive care reminders to display for this patient.  Past Medical History:  Diagnosis Date   Anxiety    Arthritis    Blood transfusion without reported diagnosis    Cataract    Cough 01/11/2014   Depression    Diabetes type 2, controlled (HCC)    Esophageal reflux disease    Hypercholesterolemia    Hypertension    Ischemic colitis (HCC) 01/28/2007   Liver enzyme elevation    Lung nodule 06/11/2012   Macular degeneration, wet (HCC)    Multiple myeloma (HCC) 05/27/2012   Cytogenetics on 06/08/2012 was normal 46, XX [20]   Osteoporosis    Other fatigue 02/08/2014   Oxygen deficiency    Pancytopenia (HCC)    Pneumonia    Post-nasal drip 01/11/2014   Sepsis (HCC)    Shortness of breath 10/15/2022   Sleep apnea    does not use CPAP   Tuberculosis     (+ TB TEST)YEARS AGO POSSIBLE EXPOSURE WAS MED TX    Type 2 diabetes mellitus without complications (HCC) 01/31/2014    Past Surgical History:  Procedure Laterality Date   APPENDECTOMY  1998  COLONOSCOPY  01/28/2011   poly; Dr. Randa Evens, next one due 2018.l   LEG SURGERY     OPEN REDUCTION INTERNAL FIXATION (ORIF) HAND Left 04/29/2022   Procedure: OPEN REDUCTION INTERNAL FIXATION with percutaneous pinning, left ring finger proximal phalanx;  Surgeon: Gomez Cleverly, MD;  Location: [facility];  Service: Orthopedics;  Laterality: Left;  mac and regional   Stem cell  2015   TONSILLECTOMY     VIDEO BRONCHOSCOPY WITH ENDOBRONCHIAL NAVIGATION Right 07/08/2012   Procedure: VIDEO BRONCHOSCOPY WITH ENDOBRONCHIAL NAVIGATION;  Surgeon: Leslye Peer, MD;  Location: [facility] OR;  Service: Thoracic;  Laterality: Right;     Current Outpatient Medications:    Blood Glucose Monitoring Suppl (ONETOUCH VERIO FLEX SYSTEM)  w/Device KIT, 1 each by Does not apply route daily at 12 noon., Disp: 1 kit, Rfl: 1   buPROPion (WELLBUTRIN XL) 150 MG 24 hr tablet, Take 150 mg by mouth every morning., Disp: , Rfl:    cholecalciferol (VITAMIN D) 1000 UNITS tablet, Take 2,000 Units by mouth daily., Disp: , Rfl:    glucose blood (ONETOUCH VERIO) test strip, 1 each by Other route daily at 12 noon., Disp: 100 each, Rfl: 3   Lancets (ONETOUCH ULTRASOFT) lancets, 1 each by Other route daily at 12 noon. Use as instructed, Disp: 100 each, Rfl: 3   losartan (COZAAR) 25 MG tablet, Take 25 mg by mouth daily., Disp: , Rfl:    magnesium chloride (SLOW-MAG) 64 MG TBEC SR tablet, Take 1 tablet by mouth 2 (two) times daily. , Disp: , Rfl:    metFORMIN (GLUCOPHAGE) 500 MG tablet, TAKE 1 IN THE MORNING AND 2 IN THE EVENING BY MOUTH, Disp: 270 tablet, Rfl: 1   Multiple Vitamins-Minerals (MULTIVITAMIN WITH MINERALS) tablet, Take 1 tablet by mouth at bedtime. , Disp: , Rfl:    Multiple Vitamins-Minerals (PRESERVISION AREDS 2) CAPS, Take 1 capsule by mouth 2 (two) times daily., Disp: , Rfl:    prazosin (MINIPRESS) 1 MG capsule, TAKE 1 CAPSULE BY MOUTH EVERYDAY AT BEDTIME, Disp: 90 capsule, Rfl: 1   simvastatin (ZOCOR) 40 MG tablet, TAKE 1 TABLET BY MOUTH AT  BEDTIME, Disp: 90 tablet, Rfl: 3   triamcinolone cream (KENALOG) 0.1 %, Apply topically 2 (two) times daily., Disp: , Rfl:    trimethoprim-polymyxin b (POLYTRIM) ophthalmic solution, Place into both eyes., Disp: , Rfl:    venlafaxine XR (EFFEXOR-XR) 150 MG 24 hr capsule, TAKE 1 CAPSULE BY MOUTH EVERY DAY WITH FOOD FOR 30 DAYS, Disp: 90 capsule, Rfl: 1  No Known Allergies ROS neg/noncontributory except as noted HPI/below      Objective:     BP 138/82 (BP Location: Left Arm, Patient Position: Sitting, Cuff Size: Large)   Pulse (!) 104   Wt 200 lb 3.2 oz (90.8 kg)   SpO2 98%   BMI 33.32 kg/m  Wt Readings from Last 3 Encounters:  12/09/22 200 lb 3.2 oz (90.8 kg)  12/06/22 203 lb 0.7 oz  (92.1 kg)  11/18/22 203 lb 2 oz (92.1 kg)    Physical Exam   Gen: WDWN NAD HEENT: NCAT, conjunctiva not injected, sclera nonicteric NECK:  supple, no thyromegaly, no nodes, no carotid bruits CARDIAC: +tachycardia, S1S2+, no murmur. DP 2+B LUNGS: CTAB. No wheezes EXT:  no edema MSK: no gross abnormalities.  NEURO: A&O x3.  CN II-XII intact.  PSYCH: normal mood. Good eye contact  Reviewed urgent care notes. Reviewed EKG from urgent care.  Reviewed cardiology note from 9/18.  Assessment & Plan:  Palpitation  Essential hypertension   Palpitations-has been off metoprolol d/t low bp and doe since Sept.  HR always elevated.  Not sure what rhythm was was in 150's.  May have been from caffeine.  Advised to avoid caffeine. If symptoms, try coughing.  Restart metoprolol xl 25mg (so 1/2 of the 50's).  Can take extra 1/2 if symptoms recur.  Decrease losartan to 1/2 of the 50mg  tabs.  Monitor.  Sees Card in December HTN-chronic.  Bp's high w/home cuff but it is reading high.  She will get new cuff or use neighbor's.  Decrease losartan to 25mg  and restart metoprolol 25mg .   If problems, let us know  Return in about 2 weeks (around 12/23/2022) for bp and pulse.   I, Isabelle Course, acting as a scribe for Angelena Sole, MD., have documented all relevant documentation on the behalf of Angelena Sole, MD, as directed by  Angelena Sole, MD while in the presence of Angelena Sole, MD.  I, Angelena Sole, MD, have reviewed all documentation for this visit. The documentation on 12/09/22 for the exam, diagnosis, procedures, and orders are all accurate and complete.  Angelena Sole, MD

## 2022-12-09 NOTE — Patient Instructions (Signed)
It was very nice to see you today!  Metoprolol 1/2 tablet of the 50mg  daily.   If heart racing/pounding, can take another 1/2.  Losartan-cut the 50's in 1/2  Monitor the blood pressures and pulse(can use the pulse oximiter for pulse)-figure out bp cuff.     PLEASE NOTE:  If you had any lab tests please let us know if you have not heard back within a few days. You may see your results on MyChart before we have a chance to review them but we will give you a call once they are reviewed by [place]. If we ordered any referrals today, please let us know if you have not heard from their office within the next week.   Please try these tips to maintain a healthy lifestyle:  Eat most of your calories during the day when you are active. Eliminate processed foods including packaged sweets (pies, cakes, cookies), reduce intake of potatoes, white bread, white pasta, and white rice. Look for whole grain options, oat flour or almond flour.  Each meal should contain half fruits/vegetables, one quarter protein, and one quarter carbs (no bigger than a computer mouse).  Cut down on sweet beverages. This includes juice, soda, and sweet tea. Also watch fruit intake, though this is a healthier sweet option, it still contains natural sugar! Limit to 3 servings daily.  Drink at least 1 glass of water with each meal and aim for at least 8 glasses per day  Exercise at least 150 minutes every week.

## 2022-12-09 NOTE — Progress Notes (Signed)
Subjective:     Patient ID: [name], female    DOB: [birth year], 77 y.o.   MRN: [id number]  Chief Complaint  Patient presents with   Palpitations    Went to UC yesterday    HPI  Palpitations -  Pt presented to UC on 11/9 for tachycardia and elevated BP. At the time she reported her sx began the night prior and she stated her jaw felt tight when this occurred. Per UC note, episode lasted 30-60 minutes and BP at home was 154/113. Also endorses diaphoresis during her episode. No cp or sob endorsed at [facility]. At UC her BP was 129/69. EKG revealed a HR of about 100 this normal sinus rhythm. No ST changes and no ectopic beats. BMP was also ordered. Labs unremarkable when compared to historic labs.   Today, she reports an episode of "heart pounding" on Friday night 11/8. She began monitoring her BP closely during this episode. Pt reports this was the first time she experienced these symptoms. She began monitoring her BP and pulse closely that night, reporting BP readings ranging from 159-164/109-113 that night. She presented to the UC the next day (see above). She doesn't regularly drink coffee, but reports drinking about 1 1/4 cups of coffee in a 8-10 oz mug prior to her episode. She makes her coffee at home using 5 scoops of coffee, stating she likes her coffee "strong". Denies any dizziness, chest pain, or shortness of breath at the time of her episode.   She is established with Dr. Chilton Si of cardiology and was last seen on 9/18. Since this visit she stopped taking Metoprolol and was monitoring her BP and pulse at least once a day. Has been taking Losartan 50 mg since stopping Metoprolol. Her October BP log had BP readings ranging from 130-170s/70-90s and pulse ranging 88-115.   Pt brought her BP monitor to this visit, stating she just recently got it and placed new batteries. First readings were BP 174/104 and pulse 102. Second reading was BP 166/99 and pulse 104. When checked with  manual cuff BP was 140/88.  Pulse is correct  Saw pt meds.  She is taking 50mg  losartan but thinking it was 25.   Pt reports she has an appointment with ENT later this afternoon. For hearing   There are no preventive care reminders to display for this patient.  Past Medical History:  Diagnosis Date   Anxiety    Arthritis    Blood transfusion without reported diagnosis    Cataract    Cough 01/11/2014   Depression    Diabetes type 2, controlled (HCC)    Esophageal reflux disease    Hypercholesterolemia    Hypertension    Ischemic colitis (HCC) 01/28/2007   Liver enzyme elevation    Lung nodule 06/11/2012   Macular degeneration, wet (HCC)    Multiple myeloma (HCC) 05/27/2012   Cytogenetics on 06/08/2012 was normal 46, XX [20]   Osteoporosis    Other fatigue 02/08/2014   Oxygen deficiency    Pancytopenia (HCC)    Pneumonia    Post-nasal drip 01/11/2014   Sepsis (HCC)    Shortness of breath 10/15/2022   Sleep apnea    does not use CPAP   Tuberculosis     (+ TB TEST)YEARS AGO POSSIBLE EXPOSURE WAS MED TX    Type 2 diabetes mellitus without complications (HCC) 01/31/2014    Past Surgical History:  Procedure Laterality Date   APPENDECTOMY  1998  COLONOSCOPY  01/28/2011   poly; Dr. Randa Evens, next one due 2018.l   LEG SURGERY     OPEN REDUCTION INTERNAL FIXATION (ORIF) HAND Left 04/29/2022   Procedure: OPEN REDUCTION INTERNAL FIXATION with percutaneous pinning, left ring finger proximal phalanx;  Surgeon: Gomez Cleverly, MD;  Location: [facility];  Service: Orthopedics;  Laterality: Left;  mac and regional   Stem cell  2015   TONSILLECTOMY     VIDEO BRONCHOSCOPY WITH ENDOBRONCHIAL NAVIGATION Right 07/08/2012   Procedure: VIDEO BRONCHOSCOPY WITH ENDOBRONCHIAL NAVIGATION;  Surgeon: Leslye Peer, MD;  Location: [facility];  Service: Thoracic;  Laterality: Right;     Current Outpatient Medications:    Blood Glucose Monitoring Suppl (ONETOUCH VERIO FLEX SYSTEM)  w/Device KIT, 1 each by Does not apply route daily at 12 noon., Disp: 1 kit, Rfl: 1   buPROPion (WELLBUTRIN XL) 150 MG 24 hr tablet, Take 150 mg by mouth every morning., Disp: , Rfl:    cholecalciferol (VITAMIN D) 1000 UNITS tablet, Take 2,000 Units by mouth daily., Disp: , Rfl:    glucose blood (ONETOUCH VERIO) test strip, 1 each by Other route daily at 12 noon., Disp: 100 each, Rfl: 3   Lancets (ONETOUCH ULTRASOFT) lancets, 1 each by Other route daily at 12 noon. Use as instructed, Disp: 100 each, Rfl: 3   losartan (COZAAR) 25 MG tablet, Take 25 mg by mouth daily., Disp: , Rfl:    magnesium chloride (SLOW-MAG) 64 MG TBEC SR tablet, Take 1 tablet by mouth 2 (two) times daily. , Disp: , Rfl:    metFORMIN (GLUCOPHAGE) 500 MG tablet, TAKE 1 IN THE MORNING AND 2 IN THE EVENING BY MOUTH, Disp: 270 tablet, Rfl: 1   Multiple Vitamins-Minerals (MULTIVITAMIN WITH MINERALS) tablet, Take 1 tablet by mouth at bedtime. , Disp: , Rfl:    Multiple Vitamins-Minerals (PRESERVISION AREDS 2) CAPS, Take 1 capsule by mouth 2 (two) times daily., Disp: , Rfl:    prazosin (MINIPRESS) 1 MG capsule, TAKE 1 CAPSULE BY MOUTH EVERYDAY AT BEDTIME, Disp: 90 capsule, Rfl: 1   simvastatin (ZOCOR) 40 MG tablet, TAKE 1 TABLET BY MOUTH AT  BEDTIME, Disp: 90 tablet, Rfl: 3   triamcinolone cream (KENALOG) 0.1 %, Apply topically 2 (two) times daily., Disp: , Rfl:    trimethoprim-polymyxin b (POLYTRIM) ophthalmic solution, Place into both eyes., Disp: , Rfl:    venlafaxine XR (EFFEXOR-XR) 150 MG 24 hr capsule, TAKE 1 CAPSULE BY MOUTH EVERY DAY WITH FOOD FOR 30 DAYS, Disp: 90 capsule, Rfl: 1  No Known Allergies ROS neg/noncontributory except as noted HPI/below      Objective:     BP 138/82 (BP Location: Left Arm, Patient Position: Sitting, Cuff Size: Large)   Pulse (!) 104   Wt 200 lb 3.2 oz (90.8 kg)   SpO2 98%   BMI 33.32 kg/m  Wt Readings from Last 3 Encounters:  12/09/22 200 lb 3.2 oz (90.8 kg)  12/06/22 203 lb 0.7 oz  (92.1 kg)  11/18/22 203 lb 2 oz (92.1 kg)    Physical Exam   Gen: WDWN NAD HEENT: NCAT, conjunctiva not injected, sclera nonicteric NECK:  supple, no thyromegaly, no nodes, no carotid bruits CARDIAC: +tachycardia, S1S2+, no murmur. DP 2+B LUNGS: CTAB. No wheezes EXT:  no edema MSK: no gross abnormalities.  NEURO: A&O x3.  CN II-XII intact.  PSYCH: normal mood. Good eye contact  Reviewed urgent care notes. Reviewed EKG from urgent care.  Reviewed cardiology note from 9/18.  Assessment & Plan:  Palpitation  Essential hypertension   Palpitations-has been off metoprolol d/t low bp and doe since Sept.  HR always elevated.  Not sure what rhythm was was in 150's.  May have been from caffeine.  Advised to avoid caffeine. If symptoms, try coughing.  Restart metoprolol xl 25mg (so 1/2 of the 50's).  Can take extra 1/2 if symptoms recur.  Decrease losartan to 1/2 of the 50mg  tabs.  Monitor.  Sees Card in December HTN-chronic.  Bp's high w/home cuff but it is reading high.  She will get new cuff or use neighbor's.  Decrease losartan to 25mg  and restart metoprolol 25mg .   If problems, let us know  Return in about 2 weeks (around 12/23/2022) for bp and pulse.   I, Isabelle Course, acting as a scribe for Angelena Sole, MD., have documented all relevant documentation on the behalf of Angelena Sole, MD, as directed by  Angelena Sole, MD while in the presence of Angelena Sole, MD.  I, Angelena Sole, MD, have reviewed all documentation for this visit. The documentation on 12/09/22 for the exam, diagnosis, procedures, and orders are all accurate and complete.  Angelena Sole, MD

## 2022-12-18 ENCOUNTER — Ambulatory Visit: Payer: Medicare Other | Admitting: Clinical

## 2022-12-18 DIAGNOSIS — F4323 Adjustment disorder with mixed anxiety and depressed mood: Secondary | ICD-10-CM

## 2022-12-18 NOTE — Progress Notes (Signed)
Time: 2:00pm-3:00pm CPT Code: 16109U Diagnosis:  F43.23  [name] was seen in person for individual therapy. She reported that she continues to think perseveratively about her marriage, and has struggled with sleep. Therapist queried about her drinking, and she reported that she continues to consume a bottle of wine per day, and attempts at cutting back have not gone well. Therapist suggested substance use treatment, but she declined for the time being. Therapist also suggested exploring EMDR therapy, and she expressed interest in this. For homework, she will look into EMDR therapy. She is scheduled to be seen again in three weeks.  Treatment Plan Client Abilities/Strengths  [name] described herself as perserverant, receptive to suggestions.  Client Treatment Preferences: [name] shared that she prefers afternoon appointments, and would like to alternate virtual vs in person sessions.  Client Statement of Needs  [name] shared that she would like to process past events in order to reduce their impact on her current functioning and move forward. She shared concerns that she may have symptoms of PTSD. Treatment Level   [name] would like to start with weekly appointments  Symptoms  [name] shared that she self-medicates with alcohol and drinks more than she would like to (up to a bottle of wine per day), fatigue, lack of purpose (which she attributes in part to limitations resulting from cancer) Problems Addressed  [name] would like to address and reduce the frequency and intensity of memories of troubling experiences.  Goals 1. [name] would like to process past events in order to move forward in her life as it is now Objective [name] would like to feel less bothered and controlled by past events   Target Date: 05/22/2023 Frequency: Weekly  Progress: 0 Modality: Individual   Related Interventions [name] will be provided with opportunities to process experiences in session Therapist to notice and disengage from  maladaptive thoughts and behaviors using CBT based strategies  [name] will process events from her past and explore their impact on her functioning Therapist will provide referrals for additional resources as appropriate    Jessica Noa, PhD               Jessica Noa, PhD

## 2022-12-19 DIAGNOSIS — H9042 Sensorineural hearing loss, unilateral, left ear, with unrestricted hearing on the contralateral side: Secondary | ICD-10-CM | POA: Diagnosis not present

## 2022-12-22 ENCOUNTER — Encounter (INDEPENDENT_AMBULATORY_CARE_PROVIDER_SITE_OTHER): Payer: Medicare Other | Admitting: Ophthalmology

## 2022-12-22 DIAGNOSIS — H35033 Hypertensive retinopathy, bilateral: Secondary | ICD-10-CM

## 2022-12-22 DIAGNOSIS — H353231 Exudative age-related macular degeneration, bilateral, with active choroidal neovascularization: Secondary | ICD-10-CM | POA: Diagnosis not present

## 2022-12-22 DIAGNOSIS — H43813 Vitreous degeneration, bilateral: Secondary | ICD-10-CM

## 2022-12-22 DIAGNOSIS — I10 Essential (primary) hypertension: Secondary | ICD-10-CM | POA: Diagnosis not present

## 2022-12-23 ENCOUNTER — Ambulatory Visit: Payer: Medicare Other | Admitting: Family Medicine

## 2022-12-24 ENCOUNTER — Emergency Department
Admission: EM | Admit: 2022-12-24 | Discharge: 2022-12-24 | Disposition: A | Discharge status: Home / Self Care | Payer: Medicare Other | Attending: Emergency Medicine | Admitting: Emergency Medicine

## 2022-12-24 DIAGNOSIS — Z87891 Personal history of nicotine dependence: Secondary | ICD-10-CM | POA: Diagnosis not present

## 2022-12-24 DIAGNOSIS — J020 Streptococcal pharyngitis: Secondary | ICD-10-CM | POA: Diagnosis not present

## 2022-12-24 LAB — GROUP A STREP, RAPID ANTIGEN: Group A Strep, Rapid Antigen: POSITIVE — AB

## 2022-12-24 LAB — COVID-19 (SARS-COV-2) & INFLUENZA  A/B, NAA (ROCHE LIAT)
Influenza A RNA: NOT DETECTED
Influenza B RNA: NOT DETECTED
SARS-CoV-2 (COVID-19) RNA: NOT DETECTED

## 2022-12-24 LAB — LAB USE ONLY - ESWAB HOLD TUBE

## 2022-12-24 MED ORDER — AMOXICILLIN 500 MG PO CAPS
500.0000 mg | ORAL_CAPSULE | Freq: Once | ORAL | Status: AC
Start: 2022-12-24 — End: 2022-12-24
  Administered 2022-12-24: 500 mg via ORAL
  Filled 2022-12-24: qty 1

## 2022-12-24 MED ORDER — LIDOCAINE VISCOUS HCL 2 % MT SOLN
5.0000 mL | Freq: Four times a day (QID) | OROMUCOSAL | 0 refills | Status: AC | PRN
Start: 2022-12-24 — End: ?

## 2022-12-24 MED ORDER — AMOXICILLIN 500 MG PO CAPS
500.0000 mg | ORAL_CAPSULE | Freq: Two times a day (BID) | ORAL | 0 refills | Status: AC
Start: 2022-12-24 — End: 2023-01-03

## 2022-12-24 NOTE — ED Triage Notes (Signed)
Pt c/o sore throat, ears hurting starting yesterday on the [employer]; denies cough

## 2022-12-24 NOTE — ED Provider Notes (Signed)
EMERGENCY DEPARTMENT NOTE     Patient initially seen and examined at   ED PHYSICIAN ASSIGNED       Date/Time Event User Comments    12/24/22 0538 Physician Assigned [facility], Daryll Drown. Army Fossa, DO assigned as Attending           ED MIDLEVEL (APP) ASSIGNED       None            HISTORY OF PRESENT ILLNESS       Chief Complaint: Sore Throat       77 y.o. female with past medical history as below who presents to the ED with HTN, DM, multiple myeloma, who presents to the ED with sore throat, bilateral ear pain. Onset of symptoms last night.  The patient reports she is here visiting from [place].  She took the [place] train up last night.  She says her symptoms are intermittent and currently not that bad.  She was concerned she may be coming down with some someone to get checked out.  Denies any fevers, cough, congestion, shortness of breath, abdominal pain, nausea, vomiting, diarrhea.    MEDICAL HISTORY     Past Medical History:  Past Medical History:   Diagnosis Date    Depression     Diabetes mellitus     Hyperlipidemia     Hypertension        Past Surgical History:  Past Surgical History[1]    Social History:  Social History[2]    Family History:  Family History[3]    Outpatient Medication:  Discharge Medication List as of 12/24/2022  6:19 AM        CONTINUE these medications which have NOT CHANGED    Details   albuterol sulfate HFA (Proventil HFA) 108 (90 Base) MCG/ACT inhaler Inhale 2 puffs into the lungs every 4 (four) hours as needed for Wheezing or Shortness of Breath, Starting Fri 01/17/2022, Until Tue 03/18/2022 at 2359, E-Rx      buPROPion XL (WELLBUTRIN XL) 300 MG 24 hr tablet Take 1 tablet (300 mg) by mouth daily, Historical Med      losartan (COZAAR) 25 MG tablet Historical Med      metFORMIN (GLUCOPHAGE) 500 MG tablet Take 1 in the morning and 2 in the evening by mouth, Historical Med      Multiple Vitamin (multivitamin) capsule Take 1 capsule by mouth daily, Historical Med      simvastatin (ZOCOR)  40 MG tablet Take 1 tablet (40 mg) by mouth, Historical Med      venlafaxine (EFFEXOR-XR) 150 MG 24 hr capsule Take by mouth, Starting Thu 07/04/2021, Historical Med             REVIEW OF SYSTEMS   See History of Present Illness    PHYSICAL EXAM     ED Triage Vitals [12/24/22 0540]   Encounter Vitals Group      BP 131/60      Systolic BP Percentile       Diastolic BP Percentile       Heart Rate (!) 111      Resp Rate 18      Temp 97.9 F (36.6 C)      Temp src Oral      SpO2 97 %      Weight 93 kg      Height 1.651 m      Head Circumference       Peak Flow       Pain  Score 3      Pain Loc       Pain Education       Exclude from Growth Chart        CONSTITUTIONAL:  no acute distress, nontoxic appearing  EYES:  no scleral icterus. no conjunctival erythema  HEAD:  atraumatic, normocephalic  ENT:   no facial swelling, no nasal deformity. Airway patent. Mild erythema to posterior pharynx.  TM normal bilaterally.  NECK:  no swelling, trachea midline  CARDIOVASCULAR:  regular rate and rhythm  PULMONARY/CHEST: lungs clear, no respiratory distress  SKIN:  warm and dry, no rash  NEURO: normal speech. no facial droop. moves all extremities.      MEDICAL DECISION MAKING     PRIMARY PROBLEM LIST      Presenting acute/chronic problem: see HPI     Chronic illness impacting care and increasing risk of acute/chronic problems (bmi >30, diabetes, hypertension, elderly (65 or older):   Depression, DM, HTN, HLD, multiple myeloma       Differential diagnosis includes but not limited to: URI, strep, pharyngitis, COVID, influenza, sinusitis, otitis media    DISCUSSION      Patient is nontoxic well-appearing.  No acute distress.  Her exam is largely unremarkable.  COVID, strep, flu were ordered and currently pending. Patient declined tylenol/motrin in ED.  Patient signed out to Dr. Lyndal Rainbow pending swabs.             External Records Reviewed?: Physician Office Records  Office visit with primary care from 12/09/22 reviewed - patient was seen  for palpitations, elevated BP  Additional Notes              ED Course as of 12/26/22 0758   Wed Dec 24, 2022   1610 Results of labs discussed. Appropriate for outpatient follow up.    [JP]      ED Course User Index  [JP] Earline Mayotte, MD         Vital Signs: Reviewed the patient's vital signs.   Nursing Notes: Reviewed and utilized available nursing notes.  Medical Records Reviewed: Reviewed available past medical records.  Counseling: The emergency provider has spoken with the patient and discussed today's findings, in addition to providing specific details for the plan of care.  Questions are answered and there is agreement with the plan.      MIPS DOCUMENTATION              CARDIAC STUDIES    The following cardiac studies were independently interpreted by me the Emergency Medicine Provider.  For full cardiac study results please see chart.        EMERGENCY IMAGING STUDIES    The following imaging studies were independently interpreted by me (emergency medicine provider):                       RADIOLOGY IMAGING STUDIES      No orders to display       EMERGENCY DEPT. MEDICATIONS      ED Medication Orders (From admission, onward)      Start Ordered     Status Ordering Provider    12/24/22 0615 12/24/22 0612  amoxicillin (AMOXIL) capsule 500 mg  Once        Route: Oral  Ordered Dose: 500 mg       Last MAR action: Given POTLURI, JAGADISH            LABORATORY RESULTS    Ordered and  independently interpreted AVAILABLE laboratory tests.   Results       Procedure Component Value Units Date/Time    COVID-19 and Influenza ([place]) (symptomatic) [[id number]]  (Normal) Collected: 12/24/22 0551    Specimen: Swab from Anterior Nares Updated: 12/24/22 0618     SARS-CoV-2 (COVID-19) RNA Not Detected     Influenza A RNA Not Detected     Influenza B RNA Not Detected    Narrative:      A result of "Detected" indicates POSITIVE for the presence of viral RNA  A result of "Not Detected" indicates NEGATIVE for the presence of viral  RNA    Test performed using the Roche cobas [place] SARS-CoV-2 & Influenza A/B assay. This is a multiplex real-time RT-PCR assay for the detection of SARS-CoV-2, influenza A, and influenza B virus RNA. Viral nucleic acids may persist in vivo, independent of viability. Detection of viral nucleic acid does not imply the presence of infectious virus, or that virus nucleic acid is the cause of clinical symptoms. Negative results do not preclude SARS-CoV-2, influenza A, and/or influenza B infection and should not be used as the sole basis for diagnosis, treatment or other patient management decisions. Invalid results may be due to inhibiting substances in the specimen and recollection should occur.     Group A Strep, Rapid Antigen [[id number]]  (Abnormal) Collected: 12/24/22 0551    Specimen: Swab from Throat Updated: 12/24/22 0606     Group A Strep, Rapid Antigen Positive            CRITICAL CARE/PROCEDURES    Procedures    DIAGNOSIS      Diagnosis:  Final diagnoses:   Strep pharyngitis       Disposition:  ED Disposition       ED Disposition   Discharge    Condition   --    Date/Time   Wed Dec 24, 2022  6:19 AM    Comment   Sherrye Payor discharge to home/self care.    Condition at disposition: Stable                 Prescriptions:  Discharge Medication List as of 12/24/2022  6:19 AM        START taking these medications    Details   amoxicillin (AMOXIL) 500 MG capsule Take 1 capsule (500 mg) by mouth 2 (two) times daily for 10 days, Starting Wed 12/24/2022, Until Sat 01/03/2023, E-Rx      lidocaine viscous (XYLOCAINE) 2 % solution Take 5 mLs by mouth every 6 (six) hours as needed for Pain, Starting Wed 12/24/2022, E-Rx           CONTINUE these medications which have NOT CHANGED    Details   albuterol sulfate HFA (Proventil HFA) 108 (90 Base) MCG/ACT inhaler Inhale 2 puffs into the lungs every 4 (four) hours as needed for Wheezing or Shortness of Breath, Starting Fri 01/17/2022, Until Tue 03/18/2022 at 2359, E-Rx       buPROPion XL (WELLBUTRIN XL) 300 MG 24 hr tablet Take 1 tablet (300 mg) by mouth daily, Historical Med      losartan (COZAAR) 25 MG tablet Historical Med      metFORMIN (GLUCOPHAGE) 500 MG tablet Take 1 in the morning and 2 in the evening by mouth, Historical Med      Multiple Vitamin (multivitamin) capsule Take 1 capsule by mouth daily, Historical Med      simvastatin (ZOCOR) 40 MG tablet Take 1 tablet (40 mg)  by mouth, Historical Med      venlafaxine (EFFEXOR-XR) 150 MG 24 hr capsule Take by mouth, Starting Thu 07/04/2021, Historical Med                 This note was generated by the Epic EMR system/ Dragon speech recognition and may contain inherent errors or omissions not intended by the user. Grammatical errors, random word insertions, deletions and pronoun errors  are occasional consequences of this technology due to software limitations. Not all errors are caught or corrected. If there are questions or concerns about the content of this note or information contained within the body of this dictation they should be addressed directly with the author for clarification.            [1]   Past Surgical History:  Procedure Laterality Date    LAPAROSCOPIC, APPENDECTOMY      LUNG LOBECTOMY     [2]   Social History  Socioeconomic History    Marital status: Legally Separated   Tobacco Use    Smoking status: Former     Current packs/day: 1.00     Average packs/day: 1 pack/day for 30.0 years (30.0 ttl pk-yrs)     Types: Cigarettes    Smokeless tobacco: Never   Vaping Use    Vaping status: Never Used   Substance and Sexual Activity    Alcohol use: Yes    Drug use: Never     Social Drivers of Psychologist, prison and probation services Strain: Low Risk  (11/18/2022)    Received from [facility]    Overall Financial Resource Strain (CARDIA)     Difficulty of Paying Living Expenses: Not hard at all   Food Insecurity: No Food Insecurity (12/24/2022)    Hunger Vital Sign     Worried About Running Out of Food in the Last Year: Never true      Ran Out of Food in the Last Year: Never true   Transportation Needs: No Transportation Needs (12/24/2022)    PRAPARE - Therapist, art (Medical): No     Lack of Transportation (Non-Medical): No   Physical Activity: Inactive (11/18/2022)    Received [facility]    Exercise Vital Sign     Days of Exercise per Week: 0 days     Minutes of Exercise per Session: 0 min   Stress: Stress Concern Present (11/18/2022)    Received from [facility] - Occupational Stress Questionnaire     Feeling of Stress : Rather much   Social Connections: Socially Isolated (11/18/2022)    Received from [facility]    Social Connection and Isolation Panel [NHANES]     Frequency of Communication with Friends and Family: Once a week     Frequency of Social Gatherings with Friends and Family: Once a week     Attends Religious Services: Never     Database administrator or Organizations: No     Attends Engineer, structural: 1 to 4 times per year     Marital Status: Divorced   Intimate Partner Violence: Not At Risk (12/24/2022)    Humiliation, Afraid, Rape, and Kick questionnaire     Fear of Current or Ex-Partner: No     Emotionally Abused: No     Physically Abused: No     Sexually Abused: No   Housing Stability: Low Risk  (12/24/2022)  Housing Stability Vital Sign     Unable to Pay for Housing in the Last Year: No     Number of Times Moved in the Last Year: 1     Homeless in the Last Year: No   [3] No family history on file.       Army Fossa, DO  12/26/22 [phone]

## 2022-12-24 NOTE — Discharge Instructions (Addendum)
Take tylenol or motrin as needed for pain. Return to the ED for continued or worsening symptoms. Follow up with your primary care doctor in the next 5-7 days.

## 2022-12-29 ENCOUNTER — Ambulatory Visit: Payer: Medicare Other | Admitting: Clinical

## 2023-01-01 ENCOUNTER — Ambulatory Visit: Payer: Medicare Other | Admitting: Family Medicine

## 2023-01-05 ENCOUNTER — Ambulatory Visit: Payer: Medicare Other | Admitting: Clinical

## 2023-01-06 ENCOUNTER — Ambulatory Visit (INDEPENDENT_AMBULATORY_CARE_PROVIDER_SITE_OTHER): Payer: Medicare Other | Admitting: Family Medicine

## 2023-01-06 ENCOUNTER — Encounter: Payer: Self-pay | Admitting: Family Medicine

## 2023-01-06 VITALS — BP 134/80 | HR 103 | Temp 97.6°F | Resp 18 | Ht 65.0 in | Wt 197.4 lb

## 2023-01-06 DIAGNOSIS — E538 Deficiency of other specified B group vitamins: Secondary | ICD-10-CM

## 2023-01-06 DIAGNOSIS — G620 Drug-induced polyneuropathy: Secondary | ICD-10-CM

## 2023-01-06 DIAGNOSIS — R000 Tachycardia, unspecified: Secondary | ICD-10-CM | POA: Diagnosis not present

## 2023-01-06 DIAGNOSIS — I10 Essential (primary) hypertension: Secondary | ICD-10-CM

## 2023-01-06 DIAGNOSIS — T451X5A Adverse effect of antineoplastic and immunosuppressive drugs, initial encounter: Secondary | ICD-10-CM

## 2023-01-06 MED ORDER — CYANOCOBALAMIN 1000 MCG/ML IJ SOLN
1000.0000 ug | Freq: Once | INTRAMUSCULAR | Status: AC
Start: 2023-01-06 — End: 2023-01-06
  Administered 2023-01-06: 1000 ug via INTRAMUSCULAR

## 2023-01-06 NOTE — Patient Instructions (Addendum)
It was very nice to see you today!  Happy Holidays Ask cardiology about do we need to increase metoprolol and stop losartan  If cough worse, other symptoms, let me know   PLEASE NOTE:  If you had any lab tests please let us know if you have not heard back within a few days. You may see your results on MyChart before we have a chance to review them but we will give you a call once they are reviewed by [place]. If we ordered any referrals today, please let us know if you have not heard from their office within the next week.   Please try these tips to maintain a healthy lifestyle:  Eat most of your calories during the day when you are active. Eliminate processed foods including packaged sweets (pies, cakes, cookies), reduce intake of potatoes, white bread, white pasta, and white rice. Look for whole grain options, oat flour or almond flour.  Each meal should contain half fruits/vegetables, one quarter protein, and one quarter carbs (no bigger than a computer mouse).  Cut down on sweet beverages. This includes juice, soda, and sweet tea. Also watch fruit intake, though this is a healthier sweet option, it still contains natural sugar! Limit to 3 servings daily.  Drink at least 1 glass of water with each meal and aim for at least 8 glasses per day  Exercise at least 150 minutes every week.

## 2023-01-06 NOTE — Progress Notes (Signed)
Subjective:     Patient ID: [name], female    DOB: [birth year], 76 y.o.   MRN: [id number]  Chief Complaint  Patient presents with   Medical Management of Chronic Issues    2 week follow-up on htn and pulse     HPI  HTN-Pt is on losartan 25 mg and metoprolol xl 25mg  .  Bp's running  hasn't checked-got sick w/strep(ER bp 131/60 and P 111 on 11/27).  No ha/dizziness/cp/palp/edema/cough/sob  Plapitations-worse when off metoprolol so restarted 25mg . Avoiding caffeine.  Sees Card on 12/17 Past 1 wk, cough-was gray and now clear.  And mucus.  Was on abx until 12/8.  No f/c Neuropathy for long time in toes.  Past few days, now on balls of feet.  Not taking B12 d/t didn't like smell.  Moods-trying hypnotherapy.  Mole on chest.  Has seen derm. Just needs reassurance Will change insurance on 1/1 and need meds to another pharmacy.  Getting Baha implant in [place]-will get scan end of month. Then sch surgery.   There are no preventive care reminders to display for this patient.  Past Medical History:  Diagnosis Date   Anxiety    Arthritis    Blood transfusion without reported diagnosis    Cataract    Cough 01/11/2014   Depression    Diabetes type 2, controlled (HCC)    Esophageal reflux disease    Hypercholesterolemia    Hypertension    Ischemic colitis (HCC) 01/28/2007   Liver enzyme elevation    Lung nodule 06/11/2012   Macular degeneration, wet (HCC)    Multiple myeloma (HCC) 05/27/2012   Cytogenetics on 06/08/2012 was normal 46, XX [20]   Osteoporosis    Other fatigue 02/08/2014   Oxygen deficiency    Pancytopenia (HCC)    Pneumonia    Post-nasal drip 01/11/2014   Sepsis (HCC)    Shortness of breath 10/15/2022   Sleep apnea    does not use CPAP   Tuberculosis     (+ TB TEST)YEARS AGO POSSIBLE EXPOSURE WAS MED TX    Type 2 diabetes mellitus without complications (HCC) 01/31/2014    Past Surgical History:  Procedure Laterality Date   APPENDECTOMY  1998   COLONOSCOPY   01/28/2011   poly; Dr. Randa Evens, next one due 2018.l   LEG SURGERY     OPEN REDUCTION INTERNAL FIXATION (ORIF) HAND Left 04/29/2022   Procedure: OPEN REDUCTION INTERNAL FIXATION with percutaneous pinning, left ring finger proximal phalanx;  Surgeon: Gomez Cleverly, MD;  Location: [facility];  Service: Orthopedics;  Laterality: Left;  mac and regional   Stem cell  2015   TONSILLECTOMY     VIDEO BRONCHOSCOPY WITH ENDOBRONCHIAL NAVIGATION Right 07/08/2012   Procedure: VIDEO BRONCHOSCOPY WITH ENDOBRONCHIAL NAVIGATION;  Surgeon: Leslye Peer, MD;  Location: [facility] OR;  Service: Thoracic;  Laterality: Right;     Current Outpatient Medications:    Blood Glucose Monitoring Suppl (ONETOUCH VERIO FLEX SYSTEM) w/Device KIT, 1 each by Does not apply route daily at 12 noon., Disp: 1 kit, Rfl: 1   buPROPion (WELLBUTRIN XL) 150 MG 24 hr tablet, Take 150 mg by mouth every morning., Disp: , Rfl:    cholecalciferol (VITAMIN D) 1000 UNITS tablet, Take 2,000 Units by mouth daily., Disp: , Rfl:    glucose blood (ONETOUCH VERIO) test strip, 1 each by Other route daily at 12 noon., Disp: 100 each, Rfl: 3   Lancets (ONETOUCH ULTRASOFT) lancets, 1 each by Other route  daily at 12 noon. Use as instructed, Disp: 100 each, Rfl: 3   losartan (COZAAR) 25 MG tablet, Take 25 mg by mouth daily., Disp: , Rfl:    magnesium chloride (SLOW-MAG) 64 MG TBEC SR tablet, Take 1 tablet by mouth 2 (two) times daily. , Disp: , Rfl:    metFORMIN (GLUCOPHAGE) 500 MG tablet, TAKE 1 IN THE MORNING AND 2 IN THE EVENING BY MOUTH, Disp: 270 tablet, Rfl: 1   metoprolol succinate (TOPROL-XL) 50 MG 24 hr tablet, Take 25 mg by mouth daily., Disp: , Rfl:    Multiple Vitamins-Minerals (MULTIVITAMIN WITH MINERALS) tablet, Take 1 tablet by mouth at bedtime. , Disp: , Rfl:    Multiple Vitamins-Minerals (PRESERVISION AREDS 2) CAPS, Take 1 capsule by mouth 2 (two) times daily., Disp: , Rfl:    prazosin (MINIPRESS) 1 MG capsule, TAKE 1 CAPSULE BY  MOUTH EVERYDAY AT BEDTIME, Disp: 90 capsule, Rfl: 1   simvastatin (ZOCOR) 40 MG tablet, TAKE 1 TABLET BY MOUTH AT  BEDTIME, Disp: 90 tablet, Rfl: 3   trimethoprim-polymyxin b (POLYTRIM) ophthalmic solution, Place into both eyes., Disp: , Rfl:    venlafaxine XR (EFFEXOR-XR) 150 MG 24 hr capsule, TAKE 1 CAPSULE BY MOUTH EVERY DAY WITH FOOD FOR 30 DAYS, Disp: 90 capsule, Rfl: 1  No Known Allergies ROS neg/noncontributory except as noted HPI/below      Objective:     BP 134/80   Pulse (!) 103   Temp 97.6 F (36.4 C) (Temporal)   Resp 18   Ht 5\' 5"  (1.651 m)   Wt 197 lb 6 oz (89.5 kg)   SpO2 99%   BMI 32.84 kg/m  Wt Readings from Last 3 Encounters:  01/06/23 197 lb 6 oz (89.5 kg)  12/09/22 200 lb 3.2 oz (90.8 kg)  12/06/22 203 lb 0.7 oz (92.1 kg)    Physical Exam   Gen: WDWN NAD HEENT: NCAT, conjunctiva not injected, sclera nonicteric TM WNL B, OP moist, no exudates  NECK:  supple, no thyromegaly, no nodes, no carotid bruits CARDIAC: tachy RRR, S1S2+, no murmur. DP 1+B LUNGS: CTAB. No wheezes EXT:  no edema MSK: no gross abnormalities.  NEURO: A&O x3.  CN II-XII intact.  PSYCH: normal mood. Good eye contact     Assessment & Plan:  Essential hypertension Assessment & Plan: Chronic.  Controlled.  Continue cozaar 25mg  daily. And metoprolol XL 25mg  d/t tachycardia   seeing card next week.  May need to consider stopping Cozaar and increasing metoprolol.   Tachycardia Assessment & Plan: Chronic.  She had had some episodes of hypotension, so we stopped the metoprolol many months back. unfortunately, tachycardia returned, so we started metoprolol 25 mg daily.  Pulse continues to be elevated in the 90s to low 100s.  No symptoms.  She will follow-up with cardiology next week.  May need to increase the dose of metoprolol and stop the losartan to prevent hypotension.  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   Worsening.  Not sure if it is worsening because of a B12 deficiency, or just natural progression of the disease.  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  Patient was not taking over-the-counter as she did not like the smell/taste.  This may be contributing to  worsening of her neuropathy.  She is agreeable to monthly injections in our office of B12  Orders: -     Cyanocobalamin  URI-patient was also diagnosed with strep.  Was on antibiotics.  Things improved.  However, still coughing.  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  If things worsen with the cough and symptoms, or do not improve in the next couple of days, she will call back and we will add a Z-Pak  Return as sch on 02/25/23, for set up monthly B12 w/nurse.  Angelena Sole, MD

## 2023-01-06 NOTE — Assessment & Plan Note (Signed)
Chronic.  Worsening.  Not sure if it is worsening because of a B12 deficiency, or just natural progression of the disease.  Discussed with patient that there is no cure for the neuropathy, however we can replace the B12 in hopes that if it is contributing, we can help that progress

## 2023-01-06 NOTE — Assessment & Plan Note (Signed)
Chronic.  Controlled.  Continue cozaar 25mg  daily. And metoprolol XL 25mg  d/t tachycardia   seeing card next week.  May need to consider stopping Cozaar and increasing metoprolol.

## 2023-01-06 NOTE — Assessment & Plan Note (Signed)
Chronic.  Patient was not taking over-the-counter as she did not like the smell/taste.  This may be contributing to worsening of her neuropathy.  She is agreeable to monthly injections in our office of B12

## 2023-01-06 NOTE — Assessment & Plan Note (Signed)
Chronic.  She had had some episodes of hypotension, so we stopped the metoprolol many months back. unfortunately, tachycardia returned, so we started metoprolol 25 mg daily.  Pulse continues to be elevated in the 90s to low 100s.  No symptoms.  She will follow-up with cardiology next week.  May need to increase the dose of metoprolol and stop the losartan to prevent hypotension.  Will defer opinion to cardiology

## 2023-01-11 ENCOUNTER — Other Ambulatory Visit: Payer: Self-pay | Admitting: Family Medicine

## 2023-01-11 DIAGNOSIS — E119 Type 2 diabetes mellitus without complications: Secondary | ICD-10-CM

## 2023-01-13 ENCOUNTER — Ambulatory Visit: Payer: Medicare Other | Admitting: Clinical

## 2023-01-13 ENCOUNTER — Ambulatory Visit (HOSPITAL_BASED_OUTPATIENT_CLINIC_OR_DEPARTMENT_OTHER): Payer: Medicare Other | Admitting: Family

## 2023-01-13 DIAGNOSIS — F4323 Adjustment disorder with mixed anxiety and depressed mood: Secondary | ICD-10-CM | POA: Diagnosis not present

## 2023-01-13 NOTE — Progress Notes (Deleted)
  Cardiology Office Note:  .   Date:  01/13/2023  ID:  [name], DOB 10-15-1945, MRN [id number] PCP: Jeani Sow, MD  [facility] Providers Cardiologist:  None { Click to update primary MD,subspecialty MD or APP then REFRESH:1}   History of Present Illness: Marland Kitchen   [name] is a 77 y.o. female with history of HFpEF, hypertension, hyperlipidemia, DM2, anemia, anxiety, multiple myeloma s/p chemotherapy 06/2012, sleep apnea on dental device, nonobstructive coronary artery disease.  Established Dr. Duke Salvia of 10/15/2022 after referral from PCP due to fatigue and dyspnea.  Prior cardiology evaluation with Dr. Lenis Noon 11/205 with noted HFpEF and pulmonary edema after receiving Velcade in early 01/2013.  Due to hypotension and fatigue Dr. Kirke Corin discontinued her metoprolol.  Losartan 25 mg daily was continued.  She was encouraged to resume using her oral device for sleep apnea as had not used in the last 6 weeks.  Echocardiogram 11/10/2022 normal LVEF 70 to 75%, mild LVH, grade 1 diastolic dysfunction, no significant valvular abnormalities.  Coronary CTA 11/11/2022 with calcium score of 18.2 with overall minimal less than 24% plaque in the LAD, mild aortic atherosclerosis.  ED visit 12/24/2022 diagnosed the strep pharyngitis. Saw PCP last week and noted she had resumed metoprolol due to palpitations.   ROS: Please see the history of present illness.    All other systems reviewed and are negative.   Studies Reviewed: .        *** Risk Assessment/Calculations:     No BP recorded.  {Refresh Note OR Click here to enter BP  :1}***       Physical Exam:   VS:  There were no vitals taken for this visit.   Wt Readings from Last 3 Encounters:  01/06/23 197 lb 6 oz (89.5 kg)  12/09/22 200 lb 3.2 oz (90.8 kg)  12/06/22 203 lb 0.7 oz (92.1 kg)    GEN: Well nourished, well developed in no acute distress NECK: No JVD; No carotid bruits CARDIAC: ***RRR, no murmurs, rubs,  gallops RESPIRATORY:  Clear to auscultation without rales, wheezing or rhonchi  ABDOMEN: Soft, non-tender, non-distended EXTREMITIES:  No edema; No deformity   ASSESSMENT AND PLAN: .    Nonobstructive CAD / HLD, LDL goal <70 - *** HTN - b***        Dispo: follow up *** with Dr. Duke Salvia  Signed, Alver Sorrow, NP

## 2023-01-13 NOTE — Progress Notes (Signed)
Time: 10:00 am-11:00 am CPT Code: 86578I Diagnosis:  F43.23  Annajean was seen in person for individual therapy. She expressed interest in clinical hypnosis rather than EMDR, and therapist provided a referral option. Session focused on processing dynamics in her family growing up, especially in consideration of her rumination about her ex-husband. She is scheduled to be seen again in February, and therapist will reach out if an appointment opens up sooner.  Treatment Plan Client Abilities/Strengths  Ariell described herself as perserverant, receptive to suggestions.  Client Treatment Preferences: Noelia shared that she prefers afternoon appointments, and would like to alternate virtual vs in person sessions.  Client Statement of Needs  Brynlea shared that she would like to process past events in order to reduce their impact on her current functioning and move forward. She shared concerns that she may have symptoms of PTSD. Treatment Level   Elfie would like to start with weekly appointments  Symptoms  Sebrenia shared that she self-medicates with alcohol and drinks more than she would like to (up to a bottle of wine per day), fatigue, lack of purpose (which she attributes in part to limitations resulting from cancer) Problems Addressed  Semaya would like to address and reduce the frequency and intensity of memories of troubling experiences.  Goals 1. Tierica would like to process past events in order to move forward in her life as it is now Objective Tajana would like to feel less bothered and controlled by past events   Target Date: 05/22/2023 Frequency: Weekly  Progress: 0 Modality: Individual   Related Interventions Senai will be provided with opportunities to process experiences in session Therapist to notice and disengage from maladaptive thoughts and behaviors using CBT based strategies  Yarelyn will process events from her past and explore their impact on her functioning Therapist will provide referrals for  additional resources as appropriate         Chrissie Noa, PhD               Chrissie Noa, PhD

## 2023-01-16 ENCOUNTER — Ambulatory Visit: Payer: Self-pay | Admitting: Family Medicine

## 2023-01-16 ENCOUNTER — Ambulatory Visit: Payer: Self-pay

## 2023-01-16 ENCOUNTER — Ambulatory Visit: Payer: Medicare Other | Admitting: Family Medicine

## 2023-01-16 DIAGNOSIS — N3001 Acute cystitis with hematuria: Secondary | ICD-10-CM | POA: Diagnosis not present

## 2023-01-16 DIAGNOSIS — R82998 Other abnormal findings in urine: Secondary | ICD-10-CM | POA: Diagnosis not present

## 2023-01-16 DIAGNOSIS — R300 Dysuria: Secondary | ICD-10-CM | POA: Diagnosis not present

## 2023-01-16 NOTE — Telephone Encounter (Signed)
Copied from CRM [id number]. Topic: Clinical - Red Word Triage >> Jan 16, 2023  2:15 PM [name] wrote: Red Word that prompted transfer to Nurse Triage: Severe pain due to UTI symptoms.  Chief Complaint:  Symptoms of  UTI has cephalexin on hane Prescription 05/27 Cephalexin - she used to take daily cephalexin.  Symptoms:Patient report symptoms of UTI  starting today. Patient states she has antibiotic Cephalexin with  dates 05/27 . She has 40 -45 pills and wanted to know what was [name] full dose would be for  [name] UTI.  Symptoms are Urgency to urinate, Increase frequency. Denies pain and then later said  pain is 6 out 1-10 pain scale. Rn spoke with clinical access no other appointments available. We did schedule another appointment with [name] clinic. Visit was offered and patient stated that was too far. Rn Agent assisted patient with location of [facility] in [place]. Advise patient she was not able to leave Urine at [facility] for urine lab . Also, not advisable to take medication without know her results of her uriner and what the medication was for.  Patient verbalized understanding. Provided information for [facility]  Urgent care in [place].   Disposition: [] ED /[x] Urgent Care (no appt availability in office) / [] Appointment(In office/virtual)/ []  [facility]/ [] Home Care/ [] Refused Recommended Disposition /[] [facility]/ []  Follow-up with PCP Additional Notes:  Canceling 345 pm appointment  . Reason for Disposition  Bad or foul-smelling urine  Answer Assessment - Initial Assessment Questions 1. SYMPTOM: "What's the main symptom you're concerned about?" (e.g., frequency, incontinence)     Increased urgency, frequency and painful urination 2. ONSET: "When did the UTI start?" Symptoms started on yesterday.      3. PAIN: "Is there any pain?" If Yes, ask: "How bad is it?" (Scale: 1-10; mild, moderate, severe)     Pain 6 4. CAUSE: "What do you think is causing  the symptoms?"    UTI 5. OTHER SYMPTOMS: "Do you have any other symptoms?" (e.g., blood in urine, fever, flank pain, pain with urination)     Pain with  urination  Protocols used: Urinary Symptoms-[name]-AH

## 2023-01-29 ENCOUNTER — Encounter: Payer: Self-pay | Admitting: Physician Assistant

## 2023-01-29 ENCOUNTER — Encounter (INDEPENDENT_AMBULATORY_CARE_PROVIDER_SITE_OTHER): Payer: Medicare Other | Admitting: Ophthalmology

## 2023-01-29 ENCOUNTER — Ambulatory Visit: Payer: Medicare Other | Admitting: Physician Assistant

## 2023-01-29 VITALS — BP 118/78 | HR 107 | Temp 97.2°F | Ht 65.0 in | Wt 197.8 lb

## 2023-01-29 DIAGNOSIS — R5383 Other fatigue: Secondary | ICD-10-CM

## 2023-01-29 DIAGNOSIS — R6883 Chills (without fever): Secondary | ICD-10-CM

## 2023-01-29 DIAGNOSIS — J020 Streptococcal pharyngitis: Secondary | ICD-10-CM | POA: Diagnosis not present

## 2023-01-29 DIAGNOSIS — R350 Frequency of micturition: Secondary | ICD-10-CM | POA: Diagnosis not present

## 2023-01-29 LAB — POC URINALSYSI DIPSTICK (AUTOMATED)
Bilirubin, UA: NEGATIVE
Blood, UA: NEGATIVE
Glucose, UA: NEGATIVE
Ketones, UA: POSITIVE
Nitrite, UA: NEGATIVE
Protein, UA: POSITIVE — AB
Spec Grav, UA: >=1.03 — AB (ref 1.010–1.025)
Urobilinogen, UA: 0.2 U/dL
pH, UA: 5 (ref 5.0–8.0)

## 2023-01-29 LAB — POC COVID19 BINAXNOW: SARS Coronavirus 2 Ag: NEGATIVE

## 2023-01-29 LAB — POCT RAPID STREP A (OFFICE): Rapid Strep A Screen: POSITIVE — AB

## 2023-01-29 MED ORDER — CEFDINIR 300 MG PO CAPS
300.0000 mg | ORAL_CAPSULE | Freq: Two times a day (BID) | ORAL | 0 refills | Status: AC
Start: 2023-01-29 — End: 2023-02-05

## 2023-01-29 NOTE — Progress Notes (Signed)
 Patient ID: [name], female    DOB: September 10, 1945, 78 y.o.   MRN: [id number]   Assessment & Plan:  Strep pharyngitis -     POCT rapid strep A -     POC COVID-19 BinaxNow -     Comprehensive metabolic panel -     CBC with Differential/Platelet -     Cefdinir ; Take 1 capsule (300 mg total) by mouth 2 (two) times daily for 7 days.  Dispense: 14 capsule; Refill: 0  Fatigue, unspecified type -     POC COVID-19 BinaxNow -     Comprehensive metabolic panel -     CBC with Differential/Platelet -     Urine Culture  Urinary frequency -     POCT Urinalysis Dipstick (Automated) -     Comprehensive metabolic panel -     CBC with Differential/Platelet -     Cefdinir ; Take 1 capsule (300 mg total) by mouth 2 (two) times daily for 7 days.  Dispense: 14 capsule; Refill: 0 -     Urine Culture  Chills (without fever) -     Comprehensive metabolic panel -     CBC with Differential/Platelet -     Cefdinir ; Take 1 capsule (300 mg total) by mouth 2 (two) times daily for 7 days.  Dispense: 14 capsule; Refill: 0 -     Urine Culture    Assessment and Plan    Strep Throat Positive rapid strep test. Recent history of strep throat in November. Patient did not change toothbrush after initial infection, which may have led to reinfection. -Start appropriate antibiotic therapy. -Advise patient to change toothbrush at the end of treatment to prevent reinfection.  Urinary Tract Infection Recent history of UTI treated with Macrobid. Patient reports persistent urgency to urinate, suggesting possible ongoing infection. -Collect urine sample for point of care urinalysis in office. -Consider further antibiotic treatment based on urinalysis results.  Hyperglycemia Elevated fasting blood glucose noted at home per patient - higher than normal she says. May be due to her infection or stress, but will check labs while in office as well.  Follow-up Plan to connect with patient via MyChart with lab results  and next steps for treatment.         Return if symptoms worsen or fail to improve.    Subjective:    Chief Complaint  Patient presents with   Ear Pain    Pt in office c/o er and throat pain; also thinks she may have UTI; seen UC 01/16/23 and was told she had acute cystitis and was taking antibiotic and was helping but now feeling sick again. Pt states symptoms of sore throat and both ears hurting; having urinary frequency and urgency; having a lot of sweats; pt had strep around 12/25/22 but feels like all symptoms keep coming back. Pt fasting glucose was over 180 today    HPI Discussed the use of AI scribe software for clinical note transcription with the patient, who gave verbal consent to proceed.  History of Present Illness   [name], a patient with a history of multiple myeloma, strep throat, and cystitis, presents with a sore throat and a possible UTI. She reports that the sore throat started the previous night, accompanied by earaches in both ears. The earaches have since resolved, but the throat remains slightly sore. She is particularly concerned about these symptoms due to a recent episode of strep throat in November.  In addition to the sore throat, she  also suspects a UTI. She reports an urgent need to urinate frequently, although she has no other symptoms. The patient had a recent diagnosis of cystitis and was treated with Macrobid. However, she is unsure if the treatment was effective as she did not receive any paperwork from the urgent care center where she was treated.  She also mentions having high blood sugar levels, which she does not monitor as frequently as she should. She reports no nausea, vomiting, stomach pain, back pain, flank pain, or blood in the urine or stool. She also mentions having sleep apnea and a history of multiple myeloma, for which she underwent a stem cell transplant about ten years ago.       Past Medical History:  Diagnosis Date   Anxiety     Arthritis    Blood transfusion without reported diagnosis    Cataract    Closed fracture of fifth metacarpal bone of right hand 04/18/2022   Cough 01/11/2014   Depression    Diabetes type 2, controlled (HCC)    Esophageal reflux disease    Fracture of proximal phalanx of finger 03/26/2022   GERD (gastroesophageal reflux disease) 08/04/2012   Hypercholesterolemia    Hypertension    Ischemic colitis (HCC) 01/28/2007   Liver enzyme elevation    Lung nodule 06/11/2012   Macular degeneration, wet (HCC)    Multiple myeloma (HCC) 05/27/2012   Cytogenetics on 06/08/2012 was normal 46, XX [20]   Osteoporosis    Other fatigue 02/08/2014   Oxygen  deficiency    Pancytopenia (HCC)    Pneumonia    Post-nasal drip 01/11/2014   Sepsis (HCC)    Shortness of breath 10/15/2022   Sleep apnea    does not use CPAP   Tuberculosis     (+ TB TEST)YEARS AGO POSSIBLE EXPOSURE WAS MED TX    Type 2 diabetes mellitus without complications (HCC) 01/31/2014    Past Surgical History:  Procedure Laterality Date   APPENDECTOMY  1998   COLONOSCOPY  01/28/2011   poly; Dr. Celestia, next one due 2018.l   LEG SURGERY     OPEN REDUCTION INTERNAL FIXATION (ORIF) HAND Left 04/29/2022   Procedure: OPEN REDUCTION INTERNAL FIXATION with percutaneous pinning, left ring finger proximal phalanx;  Surgeon: Alyse Agent, MD;  Location: [facility];  Service: Orthopedics;  Laterality: Left;  mac and regional   Stem cell  2015   TONSILLECTOMY     VIDEO BRONCHOSCOPY WITH ENDOBRONCHIAL NAVIGATION Right 07/08/2012   Procedure: VIDEO BRONCHOSCOPY WITH ENDOBRONCHIAL NAVIGATION;  Surgeon: Lamar GORMAN Chris, MD;  Location: [facility] OR;  Service: Thoracic;  Laterality: Right;    Family History  Problem Relation Age of Onset   Stroke Mother    Hearing loss Mother    Diabetes Mother    Anxiety disorder Mother    Alcohol abuse Mother    Early death Father    Depression Father    Alcohol abuse Father    Heart attack  Father    Early death Sister    Depression Sister    Alcohol abuse Sister    Cancer Sister        synovial sarcoma   Hypertension Brother    Hyperlipidemia Brother    Stroke Brother    Depression Brother    Alcohol abuse Brother    Cancer Maternal Grandfather        ? cancer    Social History   Tobacco Use   Smoking status: Former  Current packs/day: 0.00    Average packs/day: 1 pack/day for 30.0 years (30.0 ttl pk-yrs)    Types: Cigarettes    Start date: 01/28/1979    Quit date: 01/27/2009    Years since quitting: 14.0   Smokeless tobacco: Never  Vaping Use   Vaping status: Never Used  Substance Use Topics   Alcohol use: Yes    Comment: drinks daily wine or mixed drink daily   Drug use: No     No Known Allergies  Review of Systems NEGATIVE UNLESS OTHERWISE INDICATED IN HPI      Objective:     BP 118/78 (BP Location: Left Arm, Patient Position: Sitting)   Pulse (!) 107   Temp (!) 97.2 F (36.2 C) (Temporal)   Ht 5' 5 (1.651 m)   Wt 197 lb 12.8 oz (89.7 kg)   SpO2 97%   BMI 32.92 kg/m   Wt Readings from Last 3 Encounters:  01/29/23 197 lb 12.8 oz (89.7 kg)  01/06/23 197 lb 6 oz (89.5 kg)  12/09/22 200 lb 3.2 oz (90.8 kg)    BP Readings from Last 3 Encounters:  01/29/23 118/78  01/06/23 134/80  12/09/22 138/82     Physical Exam Vitals and nursing note reviewed.  Constitutional:      General: She is not in acute distress.    Appearance: Normal appearance. She is not ill-appearing.  HENT:     Head: Normocephalic and atraumatic.     Mouth/Throat:     Pharynx: Posterior oropharyngeal erythema (posterior pharynx) present. No oropharyngeal exudate.  Eyes:     Extraocular Movements: Extraocular movements intact.     Conjunctiva/sclera: Conjunctivae normal.     Pupils: Pupils are equal, round, and reactive to light.  Cardiovascular:     Rate and Rhythm: Normal rate and regular rhythm.     Pulses: Normal pulses.     Heart sounds: Normal heart  sounds.  Pulmonary:     Effort: Pulmonary effort is normal.     Breath sounds: Normal breath sounds.  Abdominal:     General: Abdomen is flat. Bowel sounds are normal.     Palpations: Abdomen is soft.     Tenderness: There is no right CVA tenderness or left CVA tenderness.  Skin:    General: Skin is warm and dry.  Neurological:     General: No focal deficit present.     Mental Status: She is alert.  Psychiatric:        Mood and Affect: Mood normal.         Time Spent: 31 minutes of total time was spent on the date of the encounter performing the following actions: chart review prior to seeing the patient, obtaining history, performing a medically necessary exam, counseling on the treatment plan, placing orders, and documenting in our EHR.       Noma Quijas M Atsushi Yom, PA-C

## 2023-01-30 ENCOUNTER — Ambulatory Visit: Payer: Medicare Other | Admitting: Family

## 2023-01-30 LAB — COMPREHENSIVE METABOLIC PANEL
ALT: 37 U/L — ABNORMAL HIGH (ref 0–35)
AST: 33 U/L (ref 0–37)
Albumin: 4.4 g/dL (ref 3.5–5.2)
Alkaline Phosphatase: 103 U/L (ref 39–117)
BUN: 23 mg/dL (ref 6–23)
CO2: 22 meq/L (ref 19–32)
Calcium: 10.8 mg/dL — ABNORMAL HIGH (ref 8.4–10.5)
Chloride: 100 meq/L (ref 96–112)
Creatinine, Ser: 1.1 mg/dL (ref 0.40–1.20)
GFR: 48.88 mL/min — ABNORMAL LOW (ref >60.00)
Glucose, Bld: 149 mg/dL — ABNORMAL HIGH (ref 70–99)
Potassium: 5.3 meq/L — ABNORMAL HIGH (ref 3.5–5.1)
Sodium: 138 meq/L (ref 135–145)
Total Bilirubin: 0.8 mg/dL (ref 0.2–1.2)
Total Protein: 7.7 g/dL (ref 6.0–8.3)

## 2023-01-30 LAB — URINE CULTURE
MICRO NUMBER:: 15910149
SPECIMEN QUALITY:: ADEQUATE

## 2023-01-30 LAB — CBC WITH DIFFERENTIAL/PLATELET
Basophils Absolute: 0 10*3/uL (ref 0.0–0.1)
Basophils Relative: 0.5 % (ref 0.0–3.0)
Eosinophils Absolute: 0 10*3/uL (ref 0.0–0.7)
Eosinophils Relative: 0.3 % (ref 0.0–5.0)
HCT: 40.7 % (ref 36.0–46.0)
Hemoglobin: 13.2 g/dL (ref 12.0–15.0)
Lymphocytes Relative: 18.8 % (ref 12.0–46.0)
Lymphs Abs: 1.7 10*3/uL (ref 0.7–4.0)
MCHC: 32.5 g/dL (ref 30.0–36.0)
MCV: 97.1 fL (ref 78.0–100.0)
Monocytes Absolute: 0.5 10*3/uL (ref 0.1–1.0)
Monocytes Relative: 5.6 % (ref 3.0–12.0)
Neutro Abs: 6.6 10*3/uL (ref 1.4–7.7)
Neutrophils Relative %: 74.8 % (ref 43.0–77.0)
Platelets: 194 10*3/uL (ref 150.0–400.0)
RBC: 4.19 Mil/uL (ref 3.87–5.11)
RDW: 16.5 % — ABNORMAL HIGH (ref 11.5–15.5)
WBC: 8.9 10*3/uL (ref 4.0–10.5)

## 2023-02-02 ENCOUNTER — Encounter (INDEPENDENT_AMBULATORY_CARE_PROVIDER_SITE_OTHER): Payer: Medicare Other | Admitting: Ophthalmology

## 2023-02-02 DIAGNOSIS — I10 Essential (primary) hypertension: Secondary | ICD-10-CM

## 2023-02-02 DIAGNOSIS — H43813 Vitreous degeneration, bilateral: Secondary | ICD-10-CM

## 2023-02-02 DIAGNOSIS — H35033 Hypertensive retinopathy, bilateral: Secondary | ICD-10-CM

## 2023-02-02 DIAGNOSIS — H353231 Exudative age-related macular degeneration, bilateral, with active choroidal neovascularization: Secondary | ICD-10-CM | POA: Diagnosis not present

## 2023-02-12 ENCOUNTER — Other Ambulatory Visit: Payer: Self-pay | Admitting: Family Medicine

## 2023-02-12 MED ORDER — BUPROPION HCL ER (XL) 150 MG PO TB24: 150.0000 mg | ORAL_TABLET | Freq: Every morning | ORAL | 0 refills | Status: DC

## 2023-02-12 NOTE — Telephone Encounter (Signed)
Copied from CRM [id number]. Topic: Clinical - Medication Refill >> Feb 12, 2023 11:26 AM Benetta Spar A wrote: Most Recent Primary Care Visit:  Provider: Bary Leriche  Department: [facility]  Visit Type: OFFICE VISIT  Date: 01/29/2023  Medication: buPROPion (WELLBUTRIN XL) 150 MG 24 hr tablet  Has the patient contacted their pharmacy? No (Agent: If no, request that the patient contact the pharmacy for the refill. If patient does not wish to contact the pharmacy document the reason why and proceed with request.) (Agent: If yes, when and what did the pharmacy advise?)  Is this the correct pharmacy for this prescription? Yes [facility] on [address] If no, delete pharmacy and type the correct one.  This is the patient's preferred pharmacy:  [facility] [place] - [address] [place] [address] Phone: [phone] Fax: [phone]  [facility] [address] [place] [address] Phone: [phone] Fax: [phone]  [facility] [address] [place] - [address] [place] [address] Phone: [phone] Fax: [phone]   Has the prescription been filled recently? No  Is the patient out of the medication? Yes  Has the patient been seen for an appointment in the last year OR does the patient have an upcoming appointment? Yes  Can we respond through MyChart? No  Agent: Please be advised that Rx refills may take up to 3 business days. We ask that you follow-up with your pharmacy.

## 2023-02-13 ENCOUNTER — Ambulatory Visit: Payer: Self-pay | Admitting: Family Medicine

## 2023-02-13 MED ORDER — BUPROPION HCL ER (XL) 150 MG PO TB24: 150.0000 mg | ORAL_TABLET | Freq: Every morning | ORAL | 0 refills | Status: DC

## 2023-02-13 NOTE — Telephone Encounter (Signed)
Copied from CRM [id number]. Topic: Clinical - Prescription Issue >> Feb 13, 2023  3:27 PM [name] wrote: Patient states her refill was sent to the wrong pharmacy, should [facility] the following [facility].   Medication: buPROPion (WELLBUTRIN XL) 150 MG 24 hr tablet [[id number]]  [facility] [address] [place] - [address] [place] [address] Phone: [phone] Fax: [phone]

## 2023-02-13 NOTE — Addendum Note (Signed)
Addended by: Jimmye Norman on: 02/13/2023 04:19 PM   Modules accepted: Orders

## 2023-02-13 NOTE — Telephone Encounter (Signed)
Left message on voicemail that Rx was resent to the correct pharmacy any questions please call office.

## 2023-02-16 DIAGNOSIS — H903 Sensorineural hearing loss, bilateral: Secondary | ICD-10-CM | POA: Diagnosis not present

## 2023-02-25 ENCOUNTER — Ambulatory Visit: Payer: Medicare Other | Admitting: Family Medicine

## 2023-03-05 ENCOUNTER — Ambulatory Visit: Payer: Medicare Other | Admitting: Clinical

## 2023-03-05 DIAGNOSIS — F4323 Adjustment disorder with mixed anxiety and depressed mood: Secondary | ICD-10-CM

## 2023-03-05 NOTE — Progress Notes (Signed)
 Time: 1:00 pm-2:00 pm CPT Code: 09162E Diagnosis:  F43.23  Graycen was seen in person for individual therapy. She requested additional referrals for hypnotherapy, and therapist provided contact information for Dr. Ozell Clara. She has taken several steps to try to improve her mood, including continuing to participate in a book club, exploring the idea of a trip to [place], and reconnecting with several friends. She created a plan to sign up for a gym and continue to look for ways to get out of the house. She also will contact Dr. Clara about possibly starting hypnotherapy. She is scheduled to be seen again in March.  Treatment Plan Client Abilities/Strengths  Noura described herself as perserverant, receptive to suggestions.  Client Treatment Preferences: Ngan shared that she prefers afternoon appointments, and would like to alternate virtual vs in person sessions.  Client Statement of Needs  Nesha shared that she would like to process past events in order to reduce their impact on her current functioning and move forward. She shared concerns that she may have symptoms of PTSD. Treatment Level   Aleathea would like to start with weekly appointments  Symptoms  Shameka shared that she self-medicates with alcohol and drinks more than she would like to (up to a bottle of wine per day), fatigue, lack of purpose (which she attributes in part to limitations resulting from cancer) Problems Addressed  Shalaine would like to address and reduce the frequency and intensity of memories of troubling experiences.  Goals 1. Katleen would like to process past events in order to move forward in her life as it is now Objective Briena would like to feel less bothered and controlled by past events   Target Date: 05/22/2023 Frequency: Weekly  Progress: 0 Modality: Individual   Related Interventions Jaiyanna will be provided with opportunities to process experiences in session Therapist to notice and disengage from maladaptive thoughts  and behaviors using CBT based strategies  Tannah will process events from her past and explore their impact on her functioning Therapist will provide referrals for additional resources as appropriate          Kara Mierzejewski L Yashar Inclan, PhD               Regine Christian L Radie Berges, PhD

## 2023-03-09 ENCOUNTER — Encounter: Payer: Medicare Other | Admitting: Family Medicine

## 2023-03-16 ENCOUNTER — Encounter (INDEPENDENT_AMBULATORY_CARE_PROVIDER_SITE_OTHER): Payer: Medicare Other | Admitting: Ophthalmology

## 2023-03-16 DIAGNOSIS — H43813 Vitreous degeneration, bilateral: Secondary | ICD-10-CM | POA: Diagnosis not present

## 2023-03-16 DIAGNOSIS — H35033 Hypertensive retinopathy, bilateral: Secondary | ICD-10-CM

## 2023-03-16 DIAGNOSIS — H353231 Exudative age-related macular degeneration, bilateral, with active choroidal neovascularization: Secondary | ICD-10-CM

## 2023-03-16 DIAGNOSIS — I10 Essential (primary) hypertension: Secondary | ICD-10-CM | POA: Diagnosis not present

## 2023-03-24 ENCOUNTER — Encounter: Payer: Self-pay | Admitting: Family Medicine

## 2023-03-24 ENCOUNTER — Ambulatory Visit (INDEPENDENT_AMBULATORY_CARE_PROVIDER_SITE_OTHER): Payer: Medicare Other | Admitting: Family Medicine

## 2023-03-24 VITALS — BP 131/67 | HR 94 | Temp 97.5°F | Resp 18 | Ht 65.0 in | Wt 197.4 lb

## 2023-03-24 DIAGNOSIS — R7989 Other specified abnormal findings of blood chemistry: Secondary | ICD-10-CM | POA: Diagnosis not present

## 2023-03-24 DIAGNOSIS — F411 Generalized anxiety disorder: Secondary | ICD-10-CM

## 2023-03-24 DIAGNOSIS — G620 Drug-induced polyneuropathy: Secondary | ICD-10-CM | POA: Diagnosis not present

## 2023-03-24 DIAGNOSIS — T451X5A Adverse effect of antineoplastic and immunosuppressive drugs, initial encounter: Secondary | ICD-10-CM | POA: Diagnosis not present

## 2023-03-24 DIAGNOSIS — D61818 Other pancytopenia: Secondary | ICD-10-CM

## 2023-03-24 DIAGNOSIS — E1169 Type 2 diabetes mellitus with other specified complication: Secondary | ICD-10-CM | POA: Diagnosis not present

## 2023-03-24 DIAGNOSIS — E119 Type 2 diabetes mellitus without complications: Secondary | ICD-10-CM

## 2023-03-24 DIAGNOSIS — Z7984 Long term (current) use of oral hypoglycemic drugs: Secondary | ICD-10-CM | POA: Diagnosis not present

## 2023-03-24 DIAGNOSIS — I10 Essential (primary) hypertension: Secondary | ICD-10-CM | POA: Diagnosis not present

## 2023-03-24 DIAGNOSIS — E785 Hyperlipidemia, unspecified: Secondary | ICD-10-CM | POA: Diagnosis not present

## 2023-03-24 DIAGNOSIS — E538 Deficiency of other specified B group vitamins: Secondary | ICD-10-CM

## 2023-03-24 MED ORDER — VENLAFAXINE HCL ER 150 MG PO CP24: ORAL_CAPSULE | ORAL | 1 refills | Status: DC

## 2023-03-24 MED ORDER — SIMVASTATIN 40 MG PO TABS
40.0000 mg | ORAL_TABLET | Freq: Every day | ORAL | 3 refills | Status: DC
Start: 2023-03-24 — End: 2023-09-23

## 2023-03-24 MED ORDER — BUPROPION HCL ER (XL) 150 MG PO TB24
150.0000 mg | ORAL_TABLET | Freq: Every morning | ORAL | 1 refills | Status: DC
Start: 2023-03-24 — End: 2023-05-18

## 2023-03-24 NOTE — Patient Instructions (Addendum)
 It was very nice to see you today!  Colace 100mg  daily.  If need to, can add miralax daily.   Of course, fiber and water in diet.   RSV immunization-get at pharmacy Get shot records from [facility]  Take B12-try different one  Call for psychiatrist.    PLEASE NOTE:  If you had any lab tests please let us know if you have not heard back within a few days. You may see your results on MyChart before we have a chance to review them but we will give you a call once they are reviewed by [place]. If we ordered any referrals today, please let us know if you have not heard from their office within the next week.   Please try these tips to maintain a healthy lifestyle:  Eat most of your calories during the day when you are active. Eliminate processed foods including packaged sweets (pies, cakes, cookies), reduce intake of potatoes, white bread, white pasta, and white rice. Look for whole grain options, oat flour or almond flour.  Each meal should contain half fruits/vegetables, one quarter protein, and one quarter carbs (no bigger than a computer mouse).  Cut down on sweet beverages. This includes juice, soda, and sweet tea. Also watch fruit intake, though this is a healthier sweet option, it still contains natural sugar! Limit to 3 servings daily.  Drink at least 1 glass of water with each meal and aim for at least 8 glasses per day  Exercise at least 150 minutes every week.

## 2023-03-24 NOTE — Progress Notes (Signed)
 Subjective:     Patient ID: [name], female    DOB: 1945/11/22, 78 y.o.   MRN: [id number]  Chief Complaint  Patient presents with   Medical Management of Chronic Issues    3 month follow-up on htn, dm Taking colace due to constipation, discuss taking something regularly Stopped taking prosozin March 3rd, having minor ear surgery BAHA      HPI  HTN-Pt is on losartan 25 mg and metoprolol xl 25mg  .  Bp's running  hasn't checked but ok at oph.  No ha/dizziness/cp/palp/edema/cough/sob  Plapitations-worse when off metoprolol so on 25mg . Avoiding caffeine and no issues.   Neuropathy for long time in toes.  Not taking B12 d/t didn't like smell.  Moods-still stuck on some things.  Getting Baha implant in L-.   Discussed the use of AI scribe software for clinical note transcription with the patient, who gave verbal consent to proceed.  History of Present Illness   [name] is a 78 year old female with hypertension and diabetes who presents for routine follow-up.  She has hypertension managed with losartan 25 mg and metoprolol 25 mg. She does not regularly monitor her blood pressure and is on her third blood pressure monitor. Her recent blood pressure reading was 138/unknown, and her pulse was in the 70s. Stopping metoprolol led to higher blood pressure and pulse. No headaches, dizziness, chest pain, or shortness of breath. She has experienced palpitations once and has reduced her coffee intake to once a week.  Her diabetes is managed with metformin, taking one in the morning and two at night. She also takes simvastatin 40 mg for cholesterol. Her blood sugar was 145 mg/dL last night and 409 mg/dL this morning, despite not eating.  She is experiencing constipation and uses Colace, which she finds effective. She is considering increasing her water and fiber intake and inquires about taking Colace 100 mg daily and possibly adding Miralax if needed.  She mentions tingling neuropathy  but no pain. She cannot take B12 supplements due to their smell and is considering trying a different brand. She has had one B12 shot but did not notice a difference.  She is experiencing excessive sleep and fatigue, which she attributes to emotional distress. She is currently taking Wellbutrin and venlafaxine but is unsure of their effectiveness. She is considering alternative therapies for emotional well-being.  seeing therapist but still struggling.  no SI  She is uncertain about her vaccination status, particularly regarding pneumonia vaccines. She recalls having one pneumonia shot but is unsure about others. She plans to check her records at [facility].  She is scheduled for a BAHA surgery on March 3rd for her left ear, involving the mastoid bone. She discussed her preference for a less visible device color, opting for brown over teal blue.       There are no preventive care reminders to display for this patient.  Past Medical History:  Diagnosis Date   Anxiety    Arthritis    Blood transfusion without reported diagnosis    Cataract    Closed fracture of fifth metacarpal bone of right hand 04/18/2022   Cough 01/11/2014   Depression    Diabetes type 2, controlled (HCC)    Esophageal reflux disease    Fracture of proximal phalanx of finger 03/26/2022   GERD (gastroesophageal reflux disease) 08/04/2012   Hypercholesterolemia    Hypertension    Ischemic colitis (HCC) 01/28/2007   Liver enzyme elevation    Lung nodule  06/11/2012   Macular degeneration, wet (HCC)    Multiple myeloma (HCC) 05/27/2012   Cytogenetics on 06/08/2012 was normal 46, XX [20]   Osteoporosis    Other fatigue 02/08/2014   Oxygen deficiency    Pancytopenia (HCC)    Pneumonia    Post-nasal drip 01/11/2014   Sepsis (HCC)    Shortness of breath 10/15/2022   Sleep apnea    does not use CPAP   Tuberculosis     (+ TB TEST)YEARS AGO POSSIBLE EXPOSURE WAS MED TX    Type 2 diabetes mellitus without complications (HCC)  01/31/2014    Past Surgical History:  Procedure Laterality Date   APPENDECTOMY  1998   COLONOSCOPY  01/28/2011   poly; Dr. Randa Evens, next one due 2018.l   LEG SURGERY     OPEN REDUCTION INTERNAL FIXATION (ORIF) HAND Left 04/29/2022   Procedure: OPEN REDUCTION INTERNAL FIXATION with percutaneous pinning, left ring finger proximal phalanx;  Surgeon: Gomez Cleverly, MD;  Location:  [facility];  Service: Orthopedics;  Laterality: Left;  mac and regional   Stem cell  2015   TONSILLECTOMY     VIDEO BRONCHOSCOPY WITH ENDOBRONCHIAL NAVIGATION Right 07/08/2012   Procedure: VIDEO BRONCHOSCOPY WITH ENDOBRONCHIAL NAVIGATION;  Surgeon: Leslye Peer, MD;  Location: [facility] OR;  Service: Thoracic;  Laterality: Right;     Current Outpatient Medications:    Blood Glucose Monitoring Suppl (ONETOUCH VERIO FLEX SYSTEM) w/Device KIT, 1 each by Does not apply route daily at 12 noon., Disp: 1 kit, Rfl: 1   cholecalciferol (VITAMIN D) 1000 UNITS tablet, Take 2,000 Units by mouth daily., Disp: , Rfl:    glucose blood (ONETOUCH VERIO) test strip, 1 each by Other route daily at 12 noon., Disp: 100 each, Rfl: 3   Lancets (ONETOUCH ULTRASOFT) lancets, 1 each by Other route daily at 12 noon. Use as instructed, Disp: 100 each, Rfl: 3   lidocaine (XYLOCAINE) 2 % solution, , Disp: , Rfl:    losartan (COZAAR) 25 MG tablet, Take 25 mg by mouth daily., Disp: , Rfl:    magnesium chloride (SLOW-MAG) 64 MG TBEC SR tablet, Take 1 tablet by mouth 2 (two) times daily. , Disp: , Rfl:    metFORMIN (GLUCOPHAGE) 500 MG tablet, TAKE 1 TABLET BY MOUTH EVERY  MORNING AND 2 TABLETS IN THE  EVENING, Disp: 270 tablet, Rfl: 3   metoprolol succinate (TOPROL-XL) 50 MG 24 hr tablet, Take 25 mg by mouth daily., Disp: , Rfl:    Multiple Vitamins-Minerals (MULTIVITAMIN WITH MINERALS) tablet, Take 1 tablet by mouth at bedtime. , Disp: , Rfl:    Multiple Vitamins-Minerals (PRESERVISION AREDS 2) CAPS, Take 1 capsule by mouth 2 (two) times  daily., Disp: , Rfl:    trimethoprim-polymyxin b (POLYTRIM) ophthalmic solution, Place into both eyes., Disp: , Rfl:    buPROPion (WELLBUTRIN XL) 150 MG 24 hr tablet, Take 1 tablet (150 mg total) by mouth every morning., Disp: 90 tablet, Rfl: 1   simvastatin (ZOCOR) 40 MG tablet, Take 1 tablet (40 mg total) by mouth at bedtime., Disp: 90 tablet, Rfl: 3   venlafaxine XR (EFFEXOR-XR) 150 MG 24 hr capsule, TAKE 1 CAPSULE BY MOUTH EVERY DAY WITH FOOD FOR 30 DAYS, Disp: 90 capsule, Rfl: 1  No Known Allergies ROS neg/noncontributory except as noted HPI/below      Objective:     BP 131/67   Pulse 94   Temp (!) 97.5 F (36.4 C) (Temporal)   Resp 18   Ht 5'  5" (1.651 m)   Wt 197 lb 6 oz (89.5 kg)   SpO2 96%   BMI 32.84 kg/m  Wt Readings from Last 3 Encounters:  03/24/23 197 lb 6 oz (89.5 kg)  01/29/23 197 lb 12.8 oz (89.7 kg)  01/06/23 197 lb 6 oz (89.5 kg)    Physical Exam   Gen: WDWN NAD HEENT: NCAT, conjunctiva not injected, sclera nonicteric TM WNL B, OP moist, no exudates  NECK:  supple, no thyromegaly, no nodes, no carotid bruits CARDIAC: tachy RRR, S1S2+, no murmur. DP 1+B LUNGS: CTAB. No wheezes EXT:  no edema MSK: no gross abnormalities.  NEURO: A&O x3.  CN II-XII intact.  PSYCH: normal mood. Good eye contact     Assessment & Plan:  Type 2 diabetes mellitus with other specified complication, without long-term current use of insulin (HCC) -     Comprehensive metabolic panel -     Lipid panel -     Hemoglobin A1c -     Microalbumin / creatinine urine ratio -     Vitamin B12  Hyperlipidemia associated with type 2 diabetes mellitus (HCC)  Essential hypertension -     Comprehensive metabolic panel -     Lipid panel -     Hemoglobin A1c -     Microalbumin / creatinine urine ratio -     Vitamin B12  Neuropathy due to chemotherapeutic drug (HCC)  Type 2 diabetes mellitus without complication, without long-term current use of insulin (HCC)  Vitamin B 12  deficiency  GAD (generalized anxiety disorder) -     Venlafaxine HCl ER; TAKE 1 CAPSULE BY MOUTH EVERY DAY WITH FOOD FOR 30 DAYS  Dispense: 90 capsule; Refill: 1 -     buPROPion HCl ER (XL); Take 1 tablet (150 mg total) by mouth every morning.  Dispense: 90 tablet; Refill: 1  Abnormal LFTs -     Simvastatin; Take 1 tablet (40 mg total) by mouth at bedtime.  Dispense: 90 tablet; Refill: 3  Other pancytopenia (HCC) -     Simvastatin; Take 1 tablet (40 mg total) by mouth at bedtime.  Dispense: 90 tablet; Refill: 3  Long term current use of oral hypoglycemic drug  Assessment and Plan    Diabetes Mellitus Type 2 Currently taking metformin with blood sugar levels at 145 mg/dL in the evening and 409 mg/dL in the morning. Dietary habits are inconsistent. Emphasized the importance of consistent diet and regular blood sugar monitoring. Continue metformin as prescribed and monitor blood sugar levels regularly. Encourage consistent dietary habits.  Hypertension Blood pressure is well-controlled with losartan 25 mg and metoprolol 25 mg. Recent blood pressure was 138/unknown with a pulse in the 70s during eye injections. Previously stopped metoprolol, which led to increased blood pressure and pulse. No symptoms like headache, dizziness, chest pain, or shortness of breath. Prefers to maintain current medication regimen. Continue losartan and metoprolol daily.  Hyperlipidemia On simvastatin 40 mg . Discussed the importance of medication adherence. Refill simvastatin 40 mg.  Constipation Experiencing ongoing constipation issues. Using Colace but unsure of dosage. Acknowledges insufficient water and fiber intake. Discussed increasing fiber, water, and exercise. Informed about adding Miralax if needed. Take Colace 100 mg daily, add Miralax if needed, increase fiber and water intake, and encourage regular exercise.  Peripheral Neuropathy Reports tingling without pain. Previously tried B12 supplements but  could not tolerate the smell. Discussed trying a different brand of B12 supplements.  Depression Feeling emotionally exhausted and sleeping excessively. Currently on Wellbutrin  and effexor but feels it may not be fully effective. Counseling sessions are unhelpful. Interested in alternative treatments like hypnosis or EMDR. Discussed potential benefits and limitations. Referred to a psychiatrist for evaluation and potential alternative treatments. Continue Wellbutrin.  General Health Maintenance Uncertain about vaccination status, particularly for pneumonia. [facility] but details are unclear. Discussed verifying vaccination records and obtaining necessary vaccines. Check shot records from [facility] and get RSV vaccine at the pharmacy.  Follow-up Order blood work today and schedule a follow-up appointment in 3 months for blood work review.        Return in about 3 months (around 06/21/2023) for chronic follow-up.  Angelena Sole, MD

## 2023-03-25 DIAGNOSIS — H9122 Sudden idiopathic hearing loss, left ear: Secondary | ICD-10-CM | POA: Diagnosis not present

## 2023-03-25 LAB — MICROALBUMIN / CREATININE URINE RATIO
Creatinine,U: 273.4 mg/dL
Microalb Creat Ratio: 70.6 mg/g — ABNORMAL HIGH (ref 0.0–30.0)
Microalb, Ur: 19.3 mg/dL — ABNORMAL HIGH (ref 0.0–1.9)

## 2023-03-25 LAB — HEMOGLOBIN A1C: Hgb A1c MFr Bld: 7 % — ABNORMAL HIGH (ref 4.6–6.5)

## 2023-03-25 LAB — COMPREHENSIVE METABOLIC PANEL
ALT: 34 U/L (ref 0–35)
AST: 31 U/L (ref 0–37)
Albumin: 4.3 g/dL (ref 3.5–5.2)
Alkaline Phosphatase: 95 U/L (ref 39–117)
BUN: 25 mg/dL — ABNORMAL HIGH (ref 6–23)
CO2: 25 meq/L (ref 19–32)
Calcium: 9.8 mg/dL (ref 8.4–10.5)
Chloride: 104 meq/L (ref 96–112)
Creatinine, Ser: 1.15 mg/dL (ref 0.40–1.20)
GFR: 46.3 mL/min — ABNORMAL LOW (ref >60.00)
Glucose, Bld: 191 mg/dL — ABNORMAL HIGH (ref 70–99)
Potassium: 4.6 meq/L (ref 3.5–5.1)
Sodium: 142 meq/L (ref 135–145)
Total Bilirubin: 0.5 mg/dL (ref 0.2–1.2)
Total Protein: 7.3 g/dL (ref 6.0–8.3)

## 2023-03-25 LAB — LIPID PANEL
Cholesterol: 218 mg/dL — ABNORMAL HIGH (ref 0–200)
HDL: 78.4 mg/dL (ref >39.00)
LDL Cholesterol: 108 mg/dL — ABNORMAL HIGH (ref 0–99)
NonHDL: 139.85
Total CHOL/HDL Ratio: 3
Triglycerides: 157 mg/dL — ABNORMAL HIGH (ref 0.0–149.0)
VLDL: 31.4 mg/dL (ref 0.0–40.0)

## 2023-03-25 LAB — VITAMIN B12: Vitamin B-12: 308 pg/mL (ref 211–911)

## 2023-03-27 ENCOUNTER — Other Ambulatory Visit: Payer: Self-pay | Admitting: *Deleted

## 2023-03-27 ENCOUNTER — Encounter: Payer: Self-pay | Admitting: Family Medicine

## 2023-03-27 DIAGNOSIS — E1169 Type 2 diabetes mellitus with other specified complication: Secondary | ICD-10-CM

## 2023-03-27 MED ORDER — EMPAGLIFLOZIN 10 MG PO TABS
10.0000 mg | ORAL_TABLET | Freq: Every day | ORAL | 0 refills | Status: DC
Start: 2023-03-27 — End: 2023-07-24

## 2023-03-27 NOTE — Progress Notes (Signed)
 1.  Bun a little elevated.  Drink more water 2.  Cholesterol a little above goal-is she taking zocor regularly or missing doses 3.  A1C-at goal but needs to work on diet 4.  Some protein in urine.  Stay on losartan.  We should add jardiance 10mg  daily to help as well if willing(if so, repeat bmp in 1 month), or discuss at next appt 5.  B12 low end of normal.  Take otc 1075mcg/day

## 2023-03-30 DIAGNOSIS — H9122 Sudden idiopathic hearing loss, left ear: Secondary | ICD-10-CM | POA: Diagnosis not present

## 2023-03-30 DIAGNOSIS — H9192 Unspecified hearing loss, left ear: Secondary | ICD-10-CM | POA: Diagnosis not present

## 2023-04-22 ENCOUNTER — Ambulatory Visit: Payer: Medicare Other | Admitting: Clinical

## 2023-04-27 ENCOUNTER — Other Ambulatory Visit: Payer: Medicare Other

## 2023-04-27 ENCOUNTER — Encounter (INDEPENDENT_AMBULATORY_CARE_PROVIDER_SITE_OTHER): Payer: Medicare Other | Admitting: Ophthalmology

## 2023-04-28 ENCOUNTER — Other Ambulatory Visit

## 2023-04-30 ENCOUNTER — Encounter (INDEPENDENT_AMBULATORY_CARE_PROVIDER_SITE_OTHER): Admitting: Ophthalmology

## 2023-04-30 DIAGNOSIS — H43813 Vitreous degeneration, bilateral: Secondary | ICD-10-CM | POA: Diagnosis not present

## 2023-04-30 DIAGNOSIS — H353231 Exudative age-related macular degeneration, bilateral, with active choroidal neovascularization: Secondary | ICD-10-CM

## 2023-04-30 DIAGNOSIS — H35033 Hypertensive retinopathy, bilateral: Secondary | ICD-10-CM | POA: Diagnosis not present

## 2023-04-30 DIAGNOSIS — I10 Essential (primary) hypertension: Secondary | ICD-10-CM

## 2023-05-15 ENCOUNTER — Other Ambulatory Visit (HOSPITAL_BASED_OUTPATIENT_CLINIC_OR_DEPARTMENT_OTHER): Payer: Self-pay | Admitting: Family Medicine

## 2023-05-15 DIAGNOSIS — Z1231 Encounter for screening mammogram for malignant neoplasm of breast: Secondary | ICD-10-CM

## 2023-05-18 ENCOUNTER — Telehealth: Payer: Self-pay | Admitting: *Deleted

## 2023-05-18 DIAGNOSIS — F411 Generalized anxiety disorder: Secondary | ICD-10-CM

## 2023-05-18 DIAGNOSIS — M810 Age-related osteoporosis without current pathological fracture: Secondary | ICD-10-CM

## 2023-05-18 MED ORDER — METOPROLOL SUCCINATE ER 50 MG PO TB24: 25.0000 mg | ORAL_TABLET | Freq: Every day | ORAL | 0 refills | Status: DC

## 2023-05-18 MED ORDER — BUPROPION HCL ER (XL) 150 MG PO TB24: 150.0000 mg | ORAL_TABLET | Freq: Every morning | ORAL | 1 refills | Status: DC

## 2023-05-18 NOTE — Telephone Encounter (Signed)
 Rx's sent to the pharmacy, ordered bone density test.

## 2023-05-18 NOTE — Telephone Encounter (Signed)
 Copied from CRM [id number]. Topic: Clinical - Medication Question >> May 15, 2023  2:39 PM [name] D wrote: Reason for CRM: Patient stated that she needs new medications prescribed and was told to reach out to the office when needed. Patient stated that she needs metoprolol  succinate (TOPROL -XL) 25 MG 24 hr tablet and buPROPion  (WELLBUTRIN  XL) 150 MG 24 hr tablet. Patient stated that she wants the medication sent to [facility] on [address].  Rx's sent to the pharmacy.

## 2023-05-18 NOTE — Telephone Encounter (Signed)
 Copied from CRM [id number]. Topic: General - Other >> May 15, 2023  2:34 PM [name] D wrote: Reason for CRM: Patient is wanting to schedule a mammogram and bone density test. Patient stated that she doesn't think she needs a referral for those but wanted someone to verify.

## 2023-05-18 NOTE — Telephone Encounter (Signed)
 Copied from CRM [id number]. Topic: Clinical - Request for Lab/Test Order >> May 18, 2023  1:23 PM Allyne Areola wrote: Reason for CRM: Patient is calling requesting a Bone density order. She is scheduled for a Mammography on 05/25/23 and would like a bone density done as well. She is going to have a BAHA implant done and they recommended she has a bone density prior to the surgery.  Bone density ordered as requested.

## 2023-05-22 ENCOUNTER — Inpatient Hospital Stay (HOSPITAL_BASED_OUTPATIENT_CLINIC_OR_DEPARTMENT_OTHER): Admission: RE | Admit: 2023-05-22 | Source: Ambulatory Visit | Admitting: Radiology

## 2023-05-25 ENCOUNTER — Ambulatory Visit (HOSPITAL_BASED_OUTPATIENT_CLINIC_OR_DEPARTMENT_OTHER)
Admission: RE | Admit: 2023-05-25 | Discharge: 2023-05-25 | Disposition: A | Discharge status: Home / Self Care | Source: Ambulatory Visit | Attending: Family Medicine | Admitting: Family Medicine

## 2023-05-25 ENCOUNTER — Encounter (HOSPITAL_BASED_OUTPATIENT_CLINIC_OR_DEPARTMENT_OTHER): Payer: Self-pay | Admitting: Radiology

## 2023-05-25 DIAGNOSIS — M8589 Other specified disorders of bone density and structure, multiple sites: Secondary | ICD-10-CM | POA: Insufficient documentation

## 2023-05-25 DIAGNOSIS — Z1231 Encounter for screening mammogram for malignant neoplasm of breast: Secondary | ICD-10-CM | POA: Diagnosis not present

## 2023-05-25 DIAGNOSIS — Z13820 Encounter for screening for osteoporosis: Secondary | ICD-10-CM | POA: Insufficient documentation

## 2023-05-25 DIAGNOSIS — Z780 Asymptomatic menopausal state: Secondary | ICD-10-CM | POA: Diagnosis not present

## 2023-05-25 DIAGNOSIS — M810 Age-related osteoporosis without current pathological fracture: Secondary | ICD-10-CM

## 2023-05-27 ENCOUNTER — Encounter: Payer: Self-pay | Admitting: Family Medicine

## 2023-05-27 DIAGNOSIS — H9122 Sudden idiopathic hearing loss, left ear: Secondary | ICD-10-CM | POA: Diagnosis not present

## 2023-05-27 LAB — HM DEXA SCAN

## 2023-05-27 NOTE — Progress Notes (Signed)
 Bone density-osteopenia-needs to walk daily and take vitamin D  1000iu/d otc

## 2023-06-05 DIAGNOSIS — H9122 Sudden idiopathic hearing loss, left ear: Secondary | ICD-10-CM | POA: Diagnosis not present

## 2023-06-05 DIAGNOSIS — H838X3 Other specified diseases of inner ear, bilateral: Secondary | ICD-10-CM | POA: Diagnosis not present

## 2023-06-05 DIAGNOSIS — H903 Sensorineural hearing loss, bilateral: Secondary | ICD-10-CM | POA: Diagnosis not present

## 2023-06-08 ENCOUNTER — Encounter (INDEPENDENT_AMBULATORY_CARE_PROVIDER_SITE_OTHER): Admitting: Ophthalmology

## 2023-06-08 DIAGNOSIS — H353231 Exudative age-related macular degeneration, bilateral, with active choroidal neovascularization: Secondary | ICD-10-CM | POA: Diagnosis not present

## 2023-06-08 DIAGNOSIS — H35033 Hypertensive retinopathy, bilateral: Secondary | ICD-10-CM | POA: Diagnosis not present

## 2023-06-08 DIAGNOSIS — H43813 Vitreous degeneration, bilateral: Secondary | ICD-10-CM | POA: Diagnosis not present

## 2023-06-08 DIAGNOSIS — I10 Essential (primary) hypertension: Secondary | ICD-10-CM

## 2023-06-10 ENCOUNTER — Other Ambulatory Visit

## 2023-06-18 ENCOUNTER — Telehealth: Payer: Self-pay | Admitting: *Deleted

## 2023-06-18 DIAGNOSIS — E119 Type 2 diabetes mellitus without complications: Secondary | ICD-10-CM

## 2023-06-18 MED ORDER — METFORMIN HCL 500 MG PO TABS: ORAL_TABLET | ORAL | 3 refills | Status: DC

## 2023-06-18 NOTE — Telephone Encounter (Signed)
 Copied from CRM [id number]. Topic: Clinical - Medication Question >> Jun 18, 2023 12:58 PM Clyde Darling P wrote: Reason for CRM: Pt is leaving for vacation on Sunday- she contacted pharmacy and that is when she is able to get her RX but she will be gone , pt would like an emergency refill for metFORMIN  (GLUCOPHAGE ) 500 MG tablet sent to [facility] [address], [place] - [address] pt state if there is an issue call [phone].  Rx sent to the pharmacy.

## 2023-06-23 ENCOUNTER — Ambulatory Visit: Payer: Medicare Other | Admitting: Family Medicine

## 2023-07-06 ENCOUNTER — Other Ambulatory Visit

## 2023-07-07 ENCOUNTER — Other Ambulatory Visit (INDEPENDENT_AMBULATORY_CARE_PROVIDER_SITE_OTHER)

## 2023-07-07 ENCOUNTER — Ambulatory Visit: Payer: Self-pay | Admitting: Family Medicine

## 2023-07-07 DIAGNOSIS — E1169 Type 2 diabetes mellitus with other specified complication: Secondary | ICD-10-CM | POA: Diagnosis not present

## 2023-07-07 LAB — BASIC METABOLIC PANEL WITH GFR
BUN: 22 mg/dL (ref 6–23)
CO2: 23 meq/L (ref 19–32)
Calcium: 9.7 mg/dL (ref 8.4–10.5)
Chloride: 105 meq/L (ref 96–112)
Creatinine, Ser: 0.96 mg/dL (ref 0.40–1.20)
GFR: 57.38 mL/min — ABNORMAL LOW (ref >60.00)
Glucose, Bld: 182 mg/dL — ABNORMAL HIGH (ref 70–99)
Potassium: 4.9 meq/L (ref 3.5–5.1)
Sodium: 139 meq/L (ref 135–145)

## 2023-07-07 NOTE — Progress Notes (Signed)
 Lab ok.  Same meds

## 2023-07-14 ENCOUNTER — Ambulatory Visit: Payer: Medicare Other

## 2023-07-14 ENCOUNTER — Ambulatory Visit: Admitting: Family Medicine

## 2023-07-17 ENCOUNTER — Ambulatory Visit: Admitting: Physician Assistant

## 2023-07-17 DIAGNOSIS — R509 Fever, unspecified: Secondary | ICD-10-CM | POA: Diagnosis not present

## 2023-07-17 DIAGNOSIS — Z20822 Contact with and (suspected) exposure to covid-19: Secondary | ICD-10-CM | POA: Diagnosis not present

## 2023-07-17 DIAGNOSIS — R82998 Other abnormal findings in urine: Secondary | ICD-10-CM | POA: Diagnosis not present

## 2023-07-17 DIAGNOSIS — L03116 Cellulitis of left lower limb: Secondary | ICD-10-CM | POA: Diagnosis not present

## 2023-07-17 DIAGNOSIS — L97529 Non-pressure chronic ulcer of other part of left foot with unspecified severity: Secondary | ICD-10-CM | POA: Diagnosis not present

## 2023-07-20 ENCOUNTER — Encounter (INDEPENDENT_AMBULATORY_CARE_PROVIDER_SITE_OTHER): Admitting: Ophthalmology

## 2023-07-20 DIAGNOSIS — H353231 Exudative age-related macular degeneration, bilateral, with active choroidal neovascularization: Secondary | ICD-10-CM

## 2023-07-20 DIAGNOSIS — H35033 Hypertensive retinopathy, bilateral: Secondary | ICD-10-CM | POA: Diagnosis not present

## 2023-07-20 DIAGNOSIS — I10 Essential (primary) hypertension: Secondary | ICD-10-CM | POA: Diagnosis not present

## 2023-07-20 DIAGNOSIS — H43813 Vitreous degeneration, bilateral: Secondary | ICD-10-CM

## 2023-07-22 ENCOUNTER — Ambulatory Visit: Admitting: Family Medicine

## 2023-07-24 ENCOUNTER — Ambulatory Visit (INDEPENDENT_AMBULATORY_CARE_PROVIDER_SITE_OTHER): Admitting: Family Medicine

## 2023-07-24 VITALS — BP 137/85 | HR 70 | Temp 97.0°F | Resp 18 | Ht 65.0 in | Wt 196.1 lb

## 2023-07-24 DIAGNOSIS — E119 Type 2 diabetes mellitus without complications: Secondary | ICD-10-CM

## 2023-07-24 DIAGNOSIS — F411 Generalized anxiety disorder: Secondary | ICD-10-CM | POA: Diagnosis not present

## 2023-07-24 DIAGNOSIS — I10 Essential (primary) hypertension: Secondary | ICD-10-CM

## 2023-07-24 DIAGNOSIS — L97521 Non-pressure chronic ulcer of other part of left foot limited to breakdown of skin: Secondary | ICD-10-CM | POA: Diagnosis not present

## 2023-07-24 DIAGNOSIS — E785 Hyperlipidemia, unspecified: Secondary | ICD-10-CM | POA: Diagnosis not present

## 2023-07-24 DIAGNOSIS — E1169 Type 2 diabetes mellitus with other specified complication: Secondary | ICD-10-CM

## 2023-07-24 DIAGNOSIS — Z7984 Long term (current) use of oral hypoglycemic drugs: Secondary | ICD-10-CM | POA: Diagnosis not present

## 2023-07-24 DIAGNOSIS — C9001 Multiple myeloma in remission: Secondary | ICD-10-CM | POA: Diagnosis not present

## 2023-07-24 LAB — POCT GLYCOSYLATED HEMOGLOBIN (HGB A1C): Hemoglobin A1C: 7.1 % — AB (ref 4.0–5.6)

## 2023-07-24 MED ORDER — DAPAGLIFLOZIN PROPANEDIOL 5 MG PO TABS: 5.0000 mg | ORAL_TABLET | Freq: Every day | ORAL | 1 refills | Status: DC

## 2023-07-24 MED ORDER — VENLAFAXINE HCL ER 150 MG PO CP24: ORAL_CAPSULE | ORAL | 1 refills | Status: DC

## 2023-07-24 MED ORDER — METOPROLOL SUCCINATE ER 50 MG PO TB24: 25.0000 mg | ORAL_TABLET | Freq: Every day | ORAL | 1 refills | Status: DC

## 2023-07-24 NOTE — Patient Instructions (Addendum)
 It was very nice to see you today!  Call psych  Insurance didn't cover jardiance , so will see if covers Farxiga-let me know either way  Call oncologist.  Some protein in urine-see if need sooner appt.    Refer to podiatrist   PLEASE NOTE:  If you had any lab tests please let us  know if you have not heard back within a few days. You may see your results on MyChart before we have a chance to review them but we will give you a call once they are reviewed by us . If we ordered any referrals today, please let us  know if you have not heard from their office within the next week.   Please try these tips to maintain a healthy lifestyle:  Eat most of your calories during the day when you are active. Eliminate processed foods including packaged sweets (pies, cakes, cookies), reduce intake of potatoes, white bread, white pasta, and white rice. Look for whole grain options, oat flour or almond flour.  Each meal should contain half fruits/vegetables, one quarter protein, and one quarter carbs (no bigger than a computer mouse).  Cut down on sweet beverages. This includes juice, soda, and sweet tea. Also watch fruit intake, though this is a healthier sweet option, it still contains natural sugar! Limit to 3 servings daily.  Drink at least 1 glass of water with each meal and aim for at least 8 glasses per day  Exercise at least 150 minutes every week.

## 2023-07-24 NOTE — Progress Notes (Signed)
 "  Subjective:     Patient ID: [name], female    DOB: 1945/02/11, 78 y.o.   MRN: [id number]  Chief Complaint  Patient presents with   Medical Management of Chronic Issues    3 month follow-up Ulcer on side of left foot, almost healed    HPI Htn,dm,hld, neurop, moods Discussed the use of AI scribe software for clinical note transcription with the patient, who gave verbal consent to proceed.  History of Present Illness [name] is a 78 year old female with diabetes, hypertension, hyperlipidemia, and a history of multiple myeloma who presents for follow-up on her chronic conditions and mood management.  She has a history of a failed [place] procedure due to bone integrity issues. Thirty years ago, a rod was placed in her left leg, and the surgeon noted that it went through the bone too easily. A titanium abutment for the [place] was attempted in two places but failed to secure properly, eventually working its way out of her head.  For hypertension, she is currently taking losartan  25 mg and metoprolol  25 mg. Her blood pressure fluctuates between 135/80 and 145/80. She was previously on a higher dose of losartan  but experienced rapid heartbeat, leading to the addition of metoprolol . No heart palpitations, headaches, dizziness, chest pain, shortness of breath, or leg swelling.  Regarding diabetes, she takes metformin  500 mg three times a day. Jardiance  was not started due to cost. Her blood sugar was 132 today, and she has been more diligent about testing since developing a foot ulcer.  She has a history of multiple myeloma, declared cured a year ago by her oncologist. She sees her oncologist annually and is due for a visit in August or September. There is a concern about protein in her urine, which could be related to her diabetes or past myeloma.  For hyperlipidemia, she takes simvastatin  but has missed doses in the past. She acknowledges taking many medications and sometimes forgetting  them.  In terms of mood management, she takes Wellbutrin  and venlafaxine . She feels more depressed and anxious due to recent family stressors, including her sister's suicide attempt and her son's divorce. She has not found talk therapy helpful and is considering other options. She is not feeling suicidal but is worried about her family.  She developed a painful ulcer on the lateral side of her left foot, treated with mupirocin ointment and doxycycline. The pain subsided once the ulcer appeared. She was seen by a PA who noted a fever at the time of the ulcer's appearance. An x-ray and white blood cell count showed no bone infection. She does not have a foot doctor and is concerned about the ulcer's recurrence-she has a hard time seeing that area on her foot.    There are no preventive care reminders to display for this patient.  Past Medical History:  Diagnosis Date   Anxiety    Arthritis    Blood transfusion without reported diagnosis    Cataract    Chronic kidney disease    Noted by MD.  Not mentioned to me.   Closed fracture of fifth metacarpal bone of right hand 04/18/2022   Cough 01/11/2014   Depression    Diabetes type 2, controlled (HCC)    Esophageal reflux disease    Fracture of proximal phalanx of finger 03/26/2022   GERD (gastroesophageal reflux disease) 08/04/2012   Hypercholesterolemia    Hypertension    Ischemic colitis (HCC) 01/28/2007   Liver enzyme elevation  Lung nodule 06/11/2012   Macular degeneration, wet (HCC)    Multiple myeloma (HCC) 05/27/2012   Cytogenetics on 06/08/2012 was normal 46, XX [20]   Osteoporosis    Other fatigue 02/08/2014   Oxygen  deficiency    Pancytopenia (HCC)    Pneumonia    Post-nasal drip 01/11/2014   Sepsis (HCC)    Shortness of breath 10/15/2022   Sleep apnea    does not use CPAP   Stroke [facility])    Ischemic colitis 2003?   Tuberculosis     (+ TB TEST)YEARS AGO POSSIBLE EXPOSURE WAS MED TX    Type 2 diabetes mellitus without  complications (HCC) 01/31/2014    Past Surgical History:  Procedure Laterality Date   APPENDECTOMY  1998   BREAST BIOPSY     COLONOSCOPY  01/28/2011   poly; Dr. Celestia, next one due 2018.l   LEG SURGERY     OPEN REDUCTION INTERNAL FIXATION (ORIF) HAND Left 04/29/2022   Procedure: OPEN REDUCTION INTERNAL FIXATION with percutaneous pinning, left ring finger proximal phalanx;  Surgeon: Alyse Agent, MD;  Location: [facility];  Service: Orthopedics;  Laterality: Left;  mac and regional   Stem cell  2015   TONSILLECTOMY     VIDEO BRONCHOSCOPY WITH ENDOBRONCHIAL NAVIGATION Right 07/08/2012   Procedure: VIDEO BRONCHOSCOPY WITH ENDOBRONCHIAL NAVIGATION;  Surgeon: Lamar GORMAN Chris, MD;  Location: [facility] OR;  Service: Thoracic;  Laterality: Right;     Current Outpatient Medications:    Blood Glucose Monitoring Suppl (ONETOUCH VERIO FLEX SYSTEM) w/Device KIT, 1 each by Does not apply route daily at 12 noon., Disp: 1 kit, Rfl: 1   buPROPion  (WELLBUTRIN  XL) 150 MG 24 hr tablet, Take 1 tablet (150 mg total) by mouth every morning., Disp: 90 tablet, Rfl: 1   cholecalciferol (VITAMIN D ) 1000 UNITS tablet, Take 2,000 Units by mouth daily., Disp: , Rfl:    dapagliflozin  propanediol (FARXIGA ) 5 MG TABS tablet, Take 1 tablet (5 mg total) by mouth daily before breakfast., Disp: 30 tablet, Rfl: 1   glucose blood (ONETOUCH VERIO) test strip, 1 each by Other route daily at 12 noon., Disp: 100 each, Rfl: 3   Lancets (ONETOUCH ULTRASOFT) lancets, 1 each by Other route daily at 12 noon. Use as instructed, Disp: 100 each, Rfl: 3   lidocaine  (XYLOCAINE ) 2 % solution, , Disp: , Rfl:    losartan  (COZAAR ) 25 MG tablet, Take 25 mg by mouth daily., Disp: , Rfl:    magnesium  chloride (SLOW-MAG) 64 MG TBEC SR tablet, Take 1 tablet by mouth 2 (two) times daily. , Disp: , Rfl:    metFORMIN  (GLUCOPHAGE ) 500 MG tablet, TAKE 1 TABLET BY MOUTH EVERY  MORNING AND 2 TABLETS IN THE  EVENING, Disp: 270 tablet, Rfl: 3    Multiple Vitamins-Minerals (MULTIVITAMIN WITH MINERALS) tablet, Take 1 tablet by mouth at bedtime. , Disp: , Rfl:    Multiple Vitamins-Minerals (PRESERVISION AREDS 2) CAPS, Take 1 capsule by mouth 2 (two) times daily., Disp: , Rfl:    simvastatin  (ZOCOR ) 40 MG tablet, Take 1 tablet (40 mg total) by mouth at bedtime., Disp: 90 tablet, Rfl: 3   trimethoprim-polymyxin b (POLYTRIM) ophthalmic solution, Place into both eyes., Disp: , Rfl:    metoprolol  succinate (TOPROL -XL) 50 MG 24 hr tablet, Take 0.5 tablets (25 mg total) by mouth daily., Disp: 90 tablet, Rfl: 1   venlafaxine  XR (EFFEXOR -XR) 150 MG 24 hr capsule, TAKE 1 CAPSULE BY MOUTH EVERY DAY WITH FOOD FOR 30 DAYS, Disp:  90 capsule, Rfl: 1  No Known Allergies ROS neg/noncontributory except as noted HPI/below      Objective:     BP 137/85   Pulse 70   Temp (!) 97 F (36.1 C) (Temporal)   Resp 18   Ht 5' 5 (1.651 m)   Wt 196 lb 2 oz (89 kg)   SpO2 95%   BMI 32.64 kg/m  Wt Readings from Last 3 Encounters:  07/24/23 196 lb 2 oz (89 kg)  03/24/23 197 lb 6 oz (89.5 kg)  01/29/23 197 lb 12.8 oz (89.7 kg)    Physical Exam   Gen: WDWN NAD HEENT: NCAT, conjunctiva not injected, sclera nonicteric NECK:  supple, no thyromegaly, no nodes, no carotid bruits CARDIAC: RRR, S1S2+, no murmur. DP 1+B LUNGS: CTAB. No wheezes ABDOMEN:  BS+, soft, NTND, No HSM, no masses EXT:  no edema  L lateral foot ulcer lateral side near mtp area-healing MSK: no gross abnormalities.  NEURO: A&O x3.  CN II-XII intact.  PSYCH: normal mood. Good eye contact  Reviewed labs, UC notes  Results for orders placed or performed in visit on 07/24/23  POCT HgB A1C   Collection Time: 07/24/23  1:52 PM  Result Value Ref Range   Hemoglobin A1C 7.1 (A) 4.0 - 5.6 %       Assessment & Plan:  Type 2 diabetes mellitus without complication, without long-term current use of insulin  (HCC) -     Dapagliflozin  Propanediol; Take 1 tablet (5 mg total) by mouth daily  before breakfast.  Dispense: 30 tablet; Refill: 1 -     POCT glycosylated hemoglobin (Hb A1C)  Essential hypertension -     Metoprolol  Succinate ER; Take 0.5 tablets (25 mg total) by mouth daily.  Dispense: 90 tablet; Refill: 1  Hyperlipidemia associated with type 2 diabetes mellitus (HCC)  Ulcerated, foot, left, limited to breakdown of skin (HCC) -     Ambulatory referral to Podiatry  Multiple myeloma in remission (HCC)  GAD (generalized anxiety disorder) -     Venlafaxine  HCl ER; TAKE 1 CAPSULE BY MOUTH EVERY DAY WITH FOOD FOR 30 DAYS  Dispense: 90 capsule; Refill: 1  Long term current use of oral hypoglycemic drug  Assessment and Plan Assessment & Plan Diabetes Mellitus Type 2   chronic. controlled Diabetes management includes metformin  500 mg three times daily. Jardiance  was not started due to cost, despite its benefits in reducing proteinuria and protecting the kidneys. Farxiga  is suggested as an alternative to Jardiance  for kidney protection, given the presence of microalbuminuria. She should check with insurance for Farxiga  coverage. Blood glucose was 132 mg/dL today, and an J8r level will be checked with a finger prick test-7.1. She should inform if Farxiga  is cost prohibitive. If covered, Farxiga  will be added, and labs will be rechecked in one month.  Diabetic Foot Ulcer   A painful ulcer on the lateral side of the left foot was treated with mupirocin ointment and doxycycline. Pain resolved after the ulcer appeared. An x-ray and white blood cell count showed no osteomyelitis. The ulcer is healing, but she does not have a podiatrist. Healing of the foot ulcer will be monitored, and a referral to a podiatrist will be placed  Hypertension  - chronic.  Not ideal Hypertension is managed with losartan  25 mg and metoprolol  25 mg. Blood pressure ranges from 135/80 mmHg to 145/80 mmHg. Increasing losartan  dosage previously caused palpitations despite metoprolol  use. Discussed the  possibility of increasing losartan  by taking an  additional half tablet but decided to maintain the current regimen due to potential side effects and her preference. Continue current regimen of losartan  25 mg and metoprolol  25 mg.  Hyperlipidemia   Hyperlipidemia is managed with simvastatin . She missed a few doses previously. Reviewed the medication regimen and confirmed she is taking simvastatin  as prescribed. Continue simvastatin  as prescribed.  Depression and Anxiety   She is on bupropion  and venlafaxine  for mood management. Reports feeling more depressed and possibly more anxious due to recent family stressors. Has not found talk therapy beneficial and has not received a response from the psychologist regarding a medication check. Discussed the potential need to increase bupropion  but expressed concern about increasing anxiety. Recommended following up with the psychologist and possibly seeing a psychiatrist for medication management.  Multiple Myeloma   She has a history of multiple myeloma, reportedly cured a year ago. There is concern about proteinuria being related to either diabetes or multiple myeloma. She is scheduled to see the oncologist in August or September. Advised to contact the oncologist to discuss proteinuria and determine if an earlier appointment is needed.    Return in about 3 months (around 10/24/2023) for chronic follow-up.  Jenkins CHRISTELLA Carrel, MD  "

## 2023-07-27 ENCOUNTER — Telehealth: Payer: Self-pay

## 2023-07-27 ENCOUNTER — Encounter: Payer: Self-pay | Admitting: Family Medicine

## 2023-07-27 NOTE — Telephone Encounter (Signed)
 She called and left a message. She recently saw PCP and had protein in urine. PCP does not know the reason for the protein in her urine, she states.  She is asking does she need a earlier appt to see you. She is scheduled in September. PCP told her to reach out to Dr. Lonn.

## 2023-07-27 NOTE — Telephone Encounter (Signed)
 Called her back and given below message. Rescheduled appts and she is aware of appts date/time.

## 2023-07-27 NOTE — Telephone Encounter (Signed)
 I do not evaluate patient for urine proteinuria; usually that is evaluated by nephrologist We can get her myeloma labs checked this week and move her appt sooner She needs 10 days in between appointment if she wants them moved

## 2023-07-28 ENCOUNTER — Ambulatory Visit: Admitting: Family Medicine

## 2023-07-29 ENCOUNTER — Ambulatory Visit: Admitting: Clinical

## 2023-07-29 DIAGNOSIS — F4323 Adjustment disorder with mixed anxiety and depressed mood: Secondary | ICD-10-CM

## 2023-07-29 NOTE — Progress Notes (Addendum)
 Time: 10:05 am-11:00 am CPT Code: 09162E Diagnosis:  F43.23  [name] was seen in person for individual therapy. She reported that she had attempted to schedule hynotherapy and EMDR, but had not heard back from the providers she had called. Therapist offered to ask about availability within [facility], and also provided contact information for [facility]. [name] reflected upon several events since her last session, including having gotten together with one of her sons and learned he is anticipating a divorce. She had also learned recently that her sister had attempted suicide, and had plans to have lunch with her the following day. Session focused on communication strategies and exploring how [name] can maintain relationships while also holding boundaries. She will follow up for additional sessions once she has initiated EMDR. Suicidal ideation and intent were denied.   Treatment Plan Client Abilities/Strengths  [name] described herself as perserverant, receptive to suggestions.  Client Treatment Preferences: [name] shared that she prefers afternoon appointments, and would like to alternate virtual vs in person sessions.  Client Statement of Needs  [name] shared that she would like to process past events in order to reduce their impact on her current functioning and move forward. She shared concerns that she may have symptoms of PTSD. Treatment Level   [name] would like to start with weekly appointments  Symptoms  [name] shared that she self-medicates with alcohol and drinks more than she would like to (up to a bottle of wine per day), fatigue, lack of purpose (which she attributes in part to limitations resulting from cancer) Problems Addressed  [name] would like to address and reduce the frequency and intensity of memories of troubling experiences.  Goals 1. [name] would like to process past events in order to move forward in her life as it is now Objective [name] would like to feel less  bothered and controlled by past events   Target Date: 05/21/2024 Frequency: Weekly  Progress: 0 Modality: Individual   Related Interventions Vincentina will be provided with opportunities to process experiences in session Therapist to notice and disengage from maladaptive thoughts and behaviors using CBT based strategies  Akelia will process events from her past and explore their impact on her functioning Therapist will provide referrals for additional resources as appropriate          Lyndsie Wallman L [name], PhD               Idil Maslanka L Devron Cohick, PhD

## 2023-07-30 ENCOUNTER — Inpatient Hospital Stay: Attending: Hematology and Oncology

## 2023-07-30 ENCOUNTER — Ambulatory Visit

## 2023-07-30 VITALS — Ht 65.0 in | Wt 196.0 lb

## 2023-07-30 DIAGNOSIS — Z0000 Encounter for general adult medical examination without abnormal findings: Secondary | ICD-10-CM | POA: Diagnosis not present

## 2023-07-30 DIAGNOSIS — Z9221 Personal history of antineoplastic chemotherapy: Secondary | ICD-10-CM | POA: Insufficient documentation

## 2023-07-30 DIAGNOSIS — C9001 Multiple myeloma in remission: Secondary | ICD-10-CM | POA: Insufficient documentation

## 2023-07-30 DIAGNOSIS — Z9484 Stem cells transplant status: Secondary | ICD-10-CM | POA: Insufficient documentation

## 2023-07-30 NOTE — Patient Instructions (Signed)
 [name] , Thank you for taking time out of your busy schedule to complete your Annual Wellness Visit with me. I enjoyed our conversation and look forward to speaking with you again next year. I, as well as your care team,  appreciate your ongoing commitment to your health goals. Please review the following plan we discussed and let me know if I can assist you in the future. Your Game plan/ To Do List   Follow up Visits: Next Medicare AWV with our clinical staff: 08/03/2024.   Have you seen your provider in the last 6 months (3 months if uncontrolled diabetes)? Yes Next Office Visit with your provider: 10/27/2023  Clinician Recommendations:  Aim for 30 minutes of exercise or brisk walking, 6-8 glasses of water, and 5 servings of fruits and vegetables each day. You are due for a pneumonia vaccine and can get that done at your local pharmacy or here in office.        This is a list of the screening recommended for you and due dates:  Health Maintenance  Topic Date Due   COVID-19 Vaccine (6 - 2024-25 season) 09/08/2023*   Flu Shot  08/28/2023   Complete foot exam   10/02/2023   Eye exam for diabetics  11/17/2023   Hemoglobin A1C  01/23/2024   DTaP/Tdap/Td vaccine (4 - Tdap) 02/15/2024   Pneumococcal Vaccine for age over 45  Completed   Hepatitis B Vaccine  Completed   DEXA scan (bone density measurement)  Completed   Hepatitis C Screening  Completed   Zoster (Shingles) Vaccine  Completed   HPV Vaccine  Aged Out   Meningitis B Vaccine  Aged Out   Colon Cancer Screening  Discontinued  *Topic was postponed. The date shown is not the original due date.    Advanced directives: (Copy Requested) Please bring a copy of your health care power of attorney and living will to the office to be added to your chart at your convenience. You can mail to [facility] [address] [place], [place] [address] or email to [email or web address] Advance Care Planning is important because  it:  [x]  Makes sure you receive the medical care that is consistent with your values, goals, and preferences  [x]  It provides guidance to your family and loved ones and reduces their decisional burden about whether or not they are making the right decisions based on your wishes.  Follow the link provided in your after visit summary or read over the paperwork we have mailed to you to help you started getting your Advance Directives in place. If you need assistance in completing these, please reach out to us  so that we can help you!  See attachments for Preventive Care and Fall Prevention Tips.

## 2023-07-30 NOTE — Progress Notes (Signed)
 Subjective:   [name] is a 78 y.o. who presents for a Medicare Wellness preventive visit.  As a reminder, Annual Wellness Visits don't include a physical exam, and some assessments may be limited, especially if this visit is performed virtually. We may recommend an in-person follow-up visit with your provider if needed.  Visit Complete: Virtual I connected with  [name] on 07/30/23 by a audio enabled telemedicine application and verified that I am speaking with the correct person using two identifiers.  Patient Location: Home  Provider Location: Home Office  I discussed the limitations of evaluation and management by telemedicine. The patient expressed understanding and agreed to proceed.  Vital Signs: Because this visit was a virtual/telehealth visit, some criteria may be missing or patient reported. Any vitals not documented were not able to be obtained and vitals that have been documented are patient reported.  VideoDeclined- This patient declined Librarian, academic. Therefore the visit was completed with audio only.  Persons Participating in Visit: Patient.  AWV Questionnaire: No: Patient Medicare AWV questionnaire was not completed prior to this visit.  Cardiac Risk Factors include: advanced age (>15men, >66 women);hypertension;diabetes mellitus;dyslipidemia;Other (see comment);obesity (BMI >30kg/m2), Risk factor comments: CKD stage3,     Objective:    Today's Vitals   07/30/23 1521  Weight: 196 lb (88.9 kg)  Height: 5' 5 (1.651 m)   Body mass index is 32.62 kg/m.     07/30/2023    3:35 PM 07/08/2022   11:23 AM 04/29/2022    8:46 AM 04/23/2022    2:22 PM 03/24/2022    5:57 PM 10/29/2015    2:31 PM 07/17/2015    1:44 PM  Advanced Directives  Does Patient Have a Medical Advance Directive? Yes Yes Yes Yes No Yes  Yes   Type of Estate agent of [place];Living will Healthcare Power of [place];Living will  Healthcare Power of [place];Living will Healthcare Power of [place];Living will  Healthcare Power of [place];Living will  Healthcare Power of [place];Living will   Does patient want to make changes to medical advance directive?   No - Guardian declined   No - Patient declined  No - Patient declined   Copy of Healthcare Power of Attorney in Chart? No - copy requested No - copy requested No - copy requested   No - copy requested  Yes   Would patient like information on creating a medical advance directive?     No - Patient declined       Data saved with a previous flowsheet row definition    Current Medications (verified) Outpatient Encounter Medications as of 07/30/2023  Medication Sig   Blood Glucose Monitoring Suppl (ONETOUCH VERIO FLEX SYSTEM) w/Device KIT 1 each by Does not apply route daily at 12 noon.   buPROPion  (WELLBUTRIN  XL) 150 MG 24 hr tablet Take 1 tablet (150 mg total) by mouth every morning.   cholecalciferol (VITAMIN D ) 1000 UNITS tablet Take 2,000 Units by mouth daily.   dapagliflozin propanediol (FARXIGA) 5 MG TABS tablet Take 1 tablet (5 mg total) by mouth daily before breakfast.   glucose blood (ONETOUCH VERIO) test strip 1 each by Other route daily at 12 noon.   Lancets (ONETOUCH ULTRASOFT) lancets 1 each by Other route daily at 12 noon. Use as instructed   lidocaine  (XYLOCAINE ) 2 % solution    losartan  (COZAAR ) 25 MG tablet Take 25 mg by mouth daily.   magnesium  chloride (SLOW-MAG) 64 MG TBEC SR tablet  Take 1 tablet by mouth 2 (two) times daily.    metFORMIN  (GLUCOPHAGE ) 500 MG tablet TAKE 1 TABLET BY MOUTH EVERY  MORNING AND 2 TABLETS IN THE  EVENING   metoprolol  succinate (TOPROL -XL) 50 MG 24 hr tablet Take 0.5 tablets (25 mg total) by mouth daily.   Multiple Vitamins-Minerals (MULTIVITAMIN WITH MINERALS) tablet Take 1 tablet by mouth at bedtime.    Multiple Vitamins-Minerals (PRESERVISION AREDS 2) CAPS Take 1 capsule by mouth 2 (two) times daily.   simvastatin   (ZOCOR ) 40 MG tablet Take 1 tablet (40 mg total) by mouth at bedtime.   trimethoprim-polymyxin b (POLYTRIM) ophthalmic solution Place into both eyes.   venlafaxine  XR (EFFEXOR -XR) 150 MG 24 hr capsule TAKE 1 CAPSULE BY MOUTH EVERY DAY WITH FOOD FOR 30 DAYS   No facility-administered encounter medications on file as of 07/30/2023.    Allergies (verified) Patient has no known allergies.   History: Past Medical History:  Diagnosis Date   Anxiety    Arthritis    Blood transfusion without reported diagnosis    Cataract    Chronic kidney disease    Noted by MD.  Not mentioned to me.   Closed fracture of fifth metacarpal bone of right hand 04/18/2022   Cough 01/11/2014   Depression    Diabetes type 2, controlled (HCC)    Esophageal reflux disease    Fracture of proximal phalanx of finger 03/26/2022   GERD (gastroesophageal reflux disease) 08/04/2012   Hypercholesterolemia    Hypertension    Ischemic colitis (HCC) 01/28/2007   Liver enzyme elevation    Lung nodule 06/11/2012   Macular degeneration, wet (HCC)    Multiple myeloma (HCC) 05/27/2012   Cytogenetics on 06/08/2012 was normal 46, XX [20]   Osteoporosis    Other fatigue 02/08/2014   Oxygen  deficiency    Pancytopenia (HCC)    Pneumonia    Post-nasal drip 01/11/2014   Sepsis (HCC)    Shortness of breath 10/15/2022   Sleep apnea    does not use CPAP   Stroke [facility])    Ischemic colitis 2003?   Tuberculosis     (+ TB TEST)YEARS AGO POSSIBLE EXPOSURE WAS MED TX    Type 2 diabetes mellitus without complications (HCC) 01/31/2014   Past Surgical History:  Procedure Laterality Date   APPENDECTOMY  1998   BREAST BIOPSY     COLONOSCOPY  01/28/2011   poly; Dr. Celestia, next one due 2018.l   LEG SURGERY     OPEN REDUCTION INTERNAL FIXATION (ORIF) HAND Left 04/29/2022   Procedure: OPEN REDUCTION INTERNAL FIXATION with percutaneous pinning, left ring finger proximal phalanx;  Surgeon: Alyse Agent, MD;  Location: [facility];  Service: Orthopedics;  Laterality: Left;  [facility]   Stem cell  2015   TONSILLECTOMY     VIDEO BRONCHOSCOPY WITH ENDOBRONCHIAL NAVIGATION Right 07/08/2012   Procedure: VIDEO BRONCHOSCOPY WITH ENDOBRONCHIAL NAVIGATION;  Surgeon: Lamar GORMAN Chris, MD;  Location: [facility] OR;  Service: Thoracic;  Laterality: Right;   Family History  Problem Relation Age of Onset   Stroke Mother    Hearing loss Mother    Diabetes Mother    Anxiety disorder Mother    Alcohol abuse Mother    Early death Father    Depression Father    Alcohol abuse Father    Heart attack Father    Early death Sister    Depression Sister    Alcohol abuse Sister    Cancer Sister  synovial sarcoma   Hypertension Brother    Hyperlipidemia Brother    Stroke Brother    Depression Brother    Alcohol abuse Brother    Cancer Maternal Grandfather        ? cancer   Social History   Socioeconomic History   Marital status: Divorced    Spouse name: Not on file   Number of children: 2   Years of education: Not on file   Highest education level: Bachelor's degree (e.g., BA, AB, BS)  Occupational History   Occupation: retired    Comment: adm. asts  Tobacco Use   Smoking status: Former    Current packs/day: 0.00    Average packs/day: 1 pack/day for 30.0 years (30.0 ttl pk-yrs)    Types: Cigarettes    Start date: 01/28/1979    Quit date: 01/27/2009    Years since quitting: 14.5   Smokeless tobacco: Never  Vaping Use   Vaping status: Never Used  Substance and Sexual Activity   Alcohol use: Yes    Comment: drinks daily wine or mixed drink daily   Drug use: No   Sexual activity: Not Currently    Birth control/protection: Post-menopausal  Other Topics Concern   Not on file  Social History Narrative   Lives alone/2025   Social Drivers of Health   Financial Resource Strain: Low Risk  (07/30/2023)   Overall Financial Resource Strain (CARDIA)    Difficulty of Paying Living Expenses: Not hard at all   Food Insecurity: No Food Insecurity (07/30/2023)   Hunger Vital Sign    Worried About Running Out of Food in the Last Year: Never true    Ran Out of Food in the Last Year: Never true  Transportation Needs: No Transportation Needs (07/30/2023)   PRAPARE - Administrator, Civil Service (Medical): No    Lack of Transportation (Non-Medical): No  Physical Activity: Inactive (07/30/2023)   Exercise Vital Sign    Days of Exercise per Week: 0 days    Minutes of Exercise per Session: 0 min  Stress: No Stress Concern Present (07/30/2023)   [employer] of Occupational Health - Occupational Stress Questionnaire    Feeling of Stress: Not at all  Recent Concern: Stress - Stress Concern Present (07/24/2023)   [employer] of Occupational Health - Occupational Stress Questionnaire    Feeling of Stress: Rather much  Social Connections: Socially Isolated (07/30/2023)   Social Connection and Isolation Panel    Frequency of Communication with Friends and Family: Twice a week    Frequency of Social Gatherings with Friends and Family: Once a week    Attends Religious Services: Never    Database administrator or Organizations: No    Attends Engineer, structural: Never    Marital Status: Divorced    Tobacco Counseling Counseling given: Not Answered    Clinical Intake:  Pre-visit preparation completed: Yes  Pain : No/denies pain     BMI - recorded: 32.62 Nutritional Status: BMI > 30  Obese Nutritional Risks: None Diabetes: Yes CBG done?: No (checked yesterday-120-per pt) Did pt. bring in CBG monitor from home?: No  Lab Results  Component Value Date   HGBA1C 7.1 (A) 07/24/2023   HGBA1C 7.0 (H) 03/24/2023   HGBA1C 6.8 (A) 10/02/2022     How often do you need to have someone help you when you read instructions, pamphlets, or other written materials from your doctor or pharmacy?: 1 - Never  Interpreter Needed?: No  Information entered by :: Trinna Broach,  RMA   Activities of Daily Living     07/30/2023    3:23 PM  In your present state of health, do you have any difficulty performing the following activities:  Hearing? 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 0  Difficulty concentrating or making decisions? 0  Walking or climbing stairs? 0  Dressing or bathing? 0  Doing errands, shopping? 0  Preparing Food and eating ? N  Using the Toilet? N  In the past six months, have you accidently leaked urine? N  Do you have problems with loss of bowel control? N  Managing your Medications? N  Managing your Finances? N  Housekeeping or managing your Housekeeping? N    Patient Care Team: Wendolyn Jenkins Jansky, MD as PCP - General (Family Medicine) Celestia Agent, MD (Inactive) as Attending Physician (Gastroenterology) Mable Lenis, MD (Inactive) as Attending Physician (Otolaryngology) Shelah Lamar RAMAN, MD as Attending Physician (Pulmonary Disease) McIver, Rudolph Scotts, DO as Consulting Physician (Hematology)  I have updated your Care Teams any recent Medical Services you may have received from other providers in the past year.     Assessment:   This is a routine wellness examination for [facility].  Hearing/Vision screen Hearing Screening - Comments:: loss of hearing in Lt ear Vision Screening - Comments:: Denies vision issues.    Goals Addressed             This Visit's Progress    Patient Stated   Not on track    Lose weight and exercise /still working on it       Depression Screen     07/30/2023    3:39 PM 12/09/2022    8:15 AM 11/18/2022    1:32 PM 10/02/2022   11:44 AM 07/08/2022   11:19 AM 06/25/2022   11:37 AM 09/24/2021   11:38 AM  PHQ 2/9 Scores  PHQ - 2 Score 1 2 2 2 4 4 4   PHQ- 9 Score 4 4 11 8 6 14 14     Fall Risk     07/30/2023    3:36 PM 12/09/2022    8:14 AM 11/18/2022    1:31 PM 10/02/2022   11:44 AM 07/08/2022   11:24 AM  Fall Risk   Falls in the past year? 0 0 0 0 1  Number falls in past yr: 0  0 0 0 1  Injury with Fall? 0 0 0 0 1  Comment     hurt left  hand  Risk for fall due to :  No Fall Risks No Fall Risks No Fall Risks Impaired balance/gait;Impaired vision  Follow up Falls evaluation completed;Falls prevention discussed Falls evaluation completed Falls evaluation completed Falls evaluation completed Falls prevention discussed    MEDICARE RISK AT HOME:  Medicare Risk at Home Any stairs in or around the home?: Yes (1/2 flight of stairs) If so, are there any without handrails?: No Home free of loose throw rugs in walkways, pet beds, electrical cords, etc?: Yes Adequate lighting in your home to reduce risk of falls?: Yes Life alert?: No Use of a cane, walker or w/c?: No Grab bars in the bathroom?: Yes Shower chair or bench in shower?: No Elevated toilet seat or a handicapped toilet?: Yes  TIMED UP AND GO:  Was the test performed?  No  Cognitive Function: Declined/Normal: No cognitive concerns noted by patient or family. Patient alert, oriented, able to answer questions appropriately and recall recent  events. No signs of memory loss or confusion.        07/08/2022   11:26 AM  6CIT Screen  What Year? 0 points  What month? 0 points  What time? 0 points  Count back from 20 0 points  Months in reverse 0 points  Repeat phrase 0 points  Total Score 0 points    Immunizations Immunization History  Administered Date(s) Administered   DTaP 08/12/2013, 11/11/2013, 02/14/2014   Fluad Quad(high Dose 65+) 10/26/2018, 10/31/2021   HIB (PRP-T) 11/11/2013, 02/14/2014, 06/28/2014   Hepatitis B, ADULT 06/28/2014   Hepatitis B, PED/ADOLESCENT 11/11/2013, 02/14/2014, 06/28/2014   IPV 08/12/2013, 11/11/2013, 02/14/2014   Influenza Split 09/28/2011   Influenza, High Dose Seasonal PF 11/20/2016, 12/10/2017, 10/26/2018, 11/17/2022   Influenza, Seasonal, Injecte, Preservative Fre 02/17/2007   Influenza,inj,Quad PF,6+ Mos 11/15/2012, 10/23/2014, 10/29/2015   Influenza-Unspecified  09/28/2011, 11/15/2012, 10/23/2014, 10/29/2015, 11/20/2016, 11/11/2021   MMR 08/23/2019   Moderna Covid-19 Fall Seasonal Vaccine 56yrs & older 12/12/2021   Moderna Sars-Covid-2 Vaccination 12/12/2021   PFIZER Comirnaty(Gray Top)Covid-19 Tri-Sucrose Vaccine 08/14/2020   PFIZER(Purple Top)SARS-COV-2 Vaccination 02/15/2019, 03/07/2019, 10/04/2019   Pneumococcal Conjugate-13 08/12/2013, 11/11/2013, 02/14/2014, 02/20/2017   Zoster Recombinant(Shingrix) 08/05/2017, 03/20/2018   Zoster, Unspecified 03/20/2018    Screening Tests Health Maintenance  Topic Date Due   COVID-19 Vaccine (6 - 2024-25 season) 09/08/2023 (Originally 09/28/2022)   INFLUENZA VACCINE  08/28/2023   FOOT EXAM  10/02/2023   OPHTHALMOLOGY EXAM  11/17/2023   HEMOGLOBIN A1C  01/23/2024   DTaP/Tdap/Td (4 - Tdap) 02/15/2024   Pneumococcal Vaccine: 50+ Years  Completed   Hepatitis B Vaccines  Completed   DEXA SCAN  Completed   Hepatitis C Screening  Completed   Zoster Vaccines- Shingrix  Completed   HPV VACCINES  Aged Out   Meningococcal B Vaccine  Aged Out   Colonoscopy  Discontinued    Health Maintenance  There are no preventive care reminders to display for this patient. Health Maintenance Items Addressed: See Nurse Notes at the end of this note  Additional Screening:  Vision Screening: Recommended annual ophthalmology exams for early detection of glaucoma and other disorders of the eye. Would you like a referral to an eye doctor? No    Dental Screening: Recommended annual dental exams for proper oral hygiene  Community Resource Referral / Chronic Care Management: CRR required this visit?  No   CCM required this visit?  No   Plan:    I have personally reviewed and noted the following in the patient's chart:   Medical and social history Use of alcohol, tobacco or illicit drugs  Current medications and supplements including opioid prescriptions. Patient is not currently taking opioid  prescriptions. Functional ability and status Nutritional status Physical activity Advanced directives List of other physicians Hospitalizations, surgeries, and ER visits in previous 12 months Vitals Screenings to include cognitive, depression, and falls Referrals and appointments  In addition, I have reviewed and discussed with patient certain preventive protocols, quality metrics, and best practice recommendations. A written personalized care plan for preventive services as well as general preventive health recommendations were provided to patient.   Demetris Capell L Benjamin Merrihew, CMA   07/30/2023   After Visit Summary: (MyChart) Due to this being a telephonic visit, the after visit summary with patients personalized plan was offered to patient via MyChart   Notes: Patient is due for a 2nd pneumonia vaccine.  Patient is up to date on all other health maintenance with no concerns to address today.

## 2023-08-03 DIAGNOSIS — H9042 Sensorineural hearing loss, unilateral, left ear, with unrestricted hearing on the contralateral side: Secondary | ICD-10-CM | POA: Diagnosis not present

## 2023-08-04 ENCOUNTER — Ambulatory Visit: Admitting: Podiatry

## 2023-08-11 ENCOUNTER — Ambulatory Visit: Admitting: Hematology and Oncology

## 2023-08-12 ENCOUNTER — Inpatient Hospital Stay

## 2023-08-12 DIAGNOSIS — Z9484 Stem cells transplant status: Secondary | ICD-10-CM | POA: Diagnosis not present

## 2023-08-12 DIAGNOSIS — C9001 Multiple myeloma in remission: Secondary | ICD-10-CM

## 2023-08-12 DIAGNOSIS — Z9221 Personal history of antineoplastic chemotherapy: Secondary | ICD-10-CM | POA: Diagnosis not present

## 2023-08-12 LAB — CBC WITH DIFFERENTIAL/PLATELET
Abs Immature Granulocytes: 0 K/uL (ref 0.00–0.07)
Basophils Absolute: 0 K/uL (ref 0.0–0.1)
Basophils Relative: 0 %
Eosinophils Absolute: 0.1 K/uL (ref 0.0–0.5)
Eosinophils Relative: 2 %
HCT: 34.1 % — ABNORMAL LOW (ref 36.0–46.0)
Hemoglobin: 11.2 g/dL — ABNORMAL LOW (ref 12.0–15.0)
Immature Granulocytes: 0 %
Lymphocytes Relative: 41 %
Lymphs Abs: 1.3 K/uL (ref 0.7–4.0)
MCH: 31.5 pg (ref 26.0–34.0)
MCHC: 32.8 g/dL (ref 30.0–36.0)
MCV: 95.8 fL (ref 80.0–100.0)
Monocytes Absolute: 0.3 K/uL (ref 0.1–1.0)
Monocytes Relative: 8 %
Neutro Abs: 1.6 K/uL — ABNORMAL LOW (ref 1.7–7.7)
Neutrophils Relative %: 49 %
Platelets: 135 K/uL — ABNORMAL LOW (ref 150–400)
RBC: 3.56 MIL/uL — ABNORMAL LOW (ref 3.87–5.11)
RDW: 14.3 % (ref 11.5–15.5)
WBC: 3.2 K/uL — ABNORMAL LOW (ref 4.0–10.5)
nRBC: 0 % (ref 0.0–0.2)

## 2023-08-12 LAB — COMPREHENSIVE METABOLIC PANEL WITH GFR
ALT: 57 U/L — ABNORMAL HIGH (ref 0–44)
AST: 55 U/L — ABNORMAL HIGH (ref 15–41)
Albumin: 3.7 g/dL (ref 3.5–5.0)
Alkaline Phosphatase: 88 U/L (ref 38–126)
Anion gap: 6 (ref 5–15)
BUN: 14 mg/dL (ref 8–23)
CO2: 26 mmol/L (ref 22–32)
Calcium: 9.1 mg/dL (ref 8.9–10.3)
Chloride: 108 mmol/L (ref 98–111)
Creatinine, Ser: 0.86 mg/dL (ref 0.44–1.00)
GFR, Estimated: >60 mL/min (ref >60)
Glucose, Bld: 163 mg/dL — ABNORMAL HIGH (ref 70–99)
Potassium: 4.5 mmol/L (ref 3.5–5.1)
Sodium: 140 mmol/L (ref 135–145)
Total Bilirubin: 0.5 mg/dL (ref 0.0–1.2)
Total Protein: 6.5 g/dL (ref 6.5–8.1)

## 2023-08-13 LAB — KAPPA/LAMBDA LIGHT CHAINS
Kappa free light chain: 24.5 mg/L — ABNORMAL HIGH (ref 3.3–19.4)
Kappa, lambda light chain ratio: 1.15 (ref 0.26–1.65)
Lambda free light chains: 21.3 mg/L (ref 5.7–26.3)

## 2023-08-14 LAB — MULTIPLE MYELOMA PANEL, SERUM
Albumin SerPl Elph-Mcnc: 3.4 g/dL (ref 2.9–4.4)
Albumin/Glob SerPl: 1.3 (ref 0.7–1.7)
Alpha 1: 0.2 g/dL (ref 0.0–0.4)
Alpha2 Glob SerPl Elph-Mcnc: 0.6 g/dL (ref 0.4–1.0)
B-Globulin SerPl Elph-Mcnc: 1.1 g/dL (ref 0.7–1.3)
Gamma Glob SerPl Elph-Mcnc: 0.9 g/dL (ref 0.4–1.8)
Globulin, Total: 2.8 g/dL (ref 2.2–3.9)
IgA: 177 mg/dL (ref 64–422)
IgG (Immunoglobin G), Serum: 1033 mg/dL (ref 586–1602)
IgM (Immunoglobulin M), Srm: 35 mg/dL (ref 26–217)
Total Protein ELP: 6.2 g/dL (ref 6.0–8.5)

## 2023-08-20 ENCOUNTER — Encounter: Payer: Self-pay | Admitting: Hematology and Oncology

## 2023-08-20 ENCOUNTER — Inpatient Hospital Stay (HOSPITAL_BASED_OUTPATIENT_CLINIC_OR_DEPARTMENT_OTHER): Admitting: Hematology and Oncology

## 2023-08-20 VITALS — BP 158/73 | HR 81 | Temp 98.7°F | Resp 18 | Ht 65.0 in | Wt 196.4 lb

## 2023-08-20 DIAGNOSIS — C9001 Multiple myeloma in remission: Secondary | ICD-10-CM | POA: Diagnosis not present

## 2023-08-20 DIAGNOSIS — Z9484 Stem cells transplant status: Secondary | ICD-10-CM | POA: Diagnosis not present

## 2023-08-20 DIAGNOSIS — Z9221 Personal history of antineoplastic chemotherapy: Secondary | ICD-10-CM | POA: Diagnosis not present

## 2023-08-20 NOTE — Assessment & Plan Note (Addendum)
 The patient was diagnosed with multiple myeloma in 2014, IgG kappa subtype, status post chemotherapy with Velcade , Revlimid  and dexamethasone  followed by autologous stem cell transplant The patient was placed on maintenance Velcade  after transplant for 2 years until 2016, discontinued due to peripheral neuropathy He was subsequently switched to Ninlaro  until 2017 and stopped due to neuropathy  Her myeloma panel shows remission Her borderline elevated light chain is not related to cancer recurrence She is not symptomatic She has no signs of renal failure, anemia or hypercalcemia I plan to see her again in a year for further follow-up with blood work to be done 10 days prior to appointment

## 2023-08-20 NOTE — Progress Notes (Signed)
 [facility] OFFICE PROGRESS NOTE  Patient Care Team: Wendolyn Jenkins Jansky, MD as PCP - General (Family Medicine) Celestia Agent, MD (Inactive) as Attending Physician (Gastroenterology) Mable Lenis, MD (Inactive) as Attending Physician (Otolaryngology) Shelah Lamar RAMAN, MD as Attending Physician (Pulmonary Disease) McIver, Rudolph Scotts, DO as Consulting Physician (Hematology) Cleatus Collar, MD as Consulting Physician (Ophthalmology)  ASSESSMENT & PLAN:  Assessment & Plan Multiple myeloma in remission [facility]) The patient was diagnosed with multiple myeloma in 2014, IgG kappa subtype, status post chemotherapy with Velcade , Revlimid  and dexamethasone  followed by autologous stem cell transplant The patient was placed on maintenance Velcade  after transplant for 2 years until 2016, discontinued due to peripheral neuropathy He was subsequently switched to Ninlaro  until 2017 and stopped due to neuropathy  Her myeloma panel shows remission Her borderline elevated light chain is not related to cancer recurrence She is not symptomatic She has no signs of renal failure, anemia or hypercalcemia I plan to see her again in a year for further follow-up with blood work to be done 10 days prior to appointment  No orders of the defined types were placed in this encounter.    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

## 2023-08-21 ENCOUNTER — Ambulatory Visit (INDEPENDENT_AMBULATORY_CARE_PROVIDER_SITE_OTHER): Admitting: Podiatry

## 2023-08-21 ENCOUNTER — Other Ambulatory Visit (HOSPITAL_COMMUNITY): Payer: Self-pay

## 2023-08-21 ENCOUNTER — Encounter: Payer: Self-pay | Admitting: Podiatry

## 2023-08-21 DIAGNOSIS — M79674 Pain in right toe(s): Secondary | ICD-10-CM

## 2023-08-21 DIAGNOSIS — M79675 Pain in left toe(s): Secondary | ICD-10-CM | POA: Diagnosis not present

## 2023-08-21 DIAGNOSIS — E1140 Type 2 diabetes mellitus with diabetic neuropathy, unspecified: Secondary | ICD-10-CM | POA: Diagnosis not present

## 2023-08-21 DIAGNOSIS — B351 Tinea unguium: Secondary | ICD-10-CM | POA: Diagnosis not present

## 2023-08-21 MED ORDER — CICLOPIROX 8 % EX SOLN
Freq: Every day | CUTANEOUS | 12 refills | Status: AC
  Filled 2023-08-21: qty 6.6, 30d supply, fill #0

## 2023-08-21 NOTE — Progress Notes (Signed)
  Subjective:  Patient ID: [name], female    DOB: [birth year],  MRN: [id number]  Chief Complaint  Patient presents with   Diabetic Ulcer    A1c 7.1 in June. No anti coags. Ulcer is healed with just some dead skin around the edge. No pain. Does have neuropathy from Chemo. She is intrested in [facility] going forward.     78 y.o. female presents with the above complaint. History confirmed with patient.  She presents to establish diabetic footcare.  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  She did have ulceration to left lateral fifth metatarsal head which actually healed very well on its own.  Patient presenting with pain related to dystrophic thickened elongated nails. Patient is unable to trim own nails related to nail dystrophy and/or mobility issues. Patient does have a history of T2DM. Patient does not have any significant calluses at this point.  Objective:  Physical Exam: warm, good capillary refill, decreased pedal hair growth nail exam onychomycosis of the toenails, onycholysis, dystrophic nails, and paronychia of yellow discoloration and nail thickening.  Mycotic in appearance. DP pulses palpable, PT pulses palpable, protective sensation absent, and vibratory sensation diminished Left Foot:  Pain with palpation of nails due to elongation and dystrophic growth.  Site of prior left fifth metatarsal head ulceration appears well-healed with good healthy skin. Right Foot: Pain with palpation of nails due to elongation and dystrophic growth.   Assessment:   1. Pain due to onychomycosis of toenails of both feet   2. Type 2 diabetes mellitus with diabetic neuropathy, unspecified whether long term insulin  use (HCC)      Plan:  Patient was evaluated and treated and all questions answered.  #Onychomycosis with pain  -Nails palliatively debrided as below. -Educated on self-care - Discussed pharmacological treatment of onychomycosis. - Given her medical history and current medications,  think topical treatment would be a better option than oral medication.  She would like to proceed with this.  Penlac  sent to patient's pharmacy with proper application instructions discussed with patient  Procedure: Nail Debridement Rationale: Pain Type of Debridement: manual, sharp debridement. Instrumentation: Nail nipper, rotary burr. Number of Nails: 10  # Diabetes with neuropathy -Patient educated on diabetes. Discussed proper diabetic foot care and discussed risks and complications of disease. Educated patient in depth on reasons to return to the office immediately should he/she discover anything concerning or new on the feet. All questions answered. Discussed proper shoes as well.  - Neuropathy also from patient's current chemotherapy.  Return in about 3 months (around 11/21/2023) for Diabetic Foot Care.         Ethan Saddler, DPM [facility]

## 2023-08-27 NOTE — Progress Notes (Signed)
 This encounter was created in error - please disregard.

## 2023-08-31 ENCOUNTER — Encounter (INDEPENDENT_AMBULATORY_CARE_PROVIDER_SITE_OTHER): Admitting: Ophthalmology

## 2023-09-02 ENCOUNTER — Encounter (INDEPENDENT_AMBULATORY_CARE_PROVIDER_SITE_OTHER): Admitting: Ophthalmology

## 2023-09-02 DIAGNOSIS — H43813 Vitreous degeneration, bilateral: Secondary | ICD-10-CM | POA: Diagnosis not present

## 2023-09-02 DIAGNOSIS — H35033 Hypertensive retinopathy, bilateral: Secondary | ICD-10-CM

## 2023-09-02 DIAGNOSIS — H353231 Exudative age-related macular degeneration, bilateral, with active choroidal neovascularization: Secondary | ICD-10-CM | POA: Diagnosis not present

## 2023-09-02 DIAGNOSIS — I10 Essential (primary) hypertension: Secondary | ICD-10-CM

## 2023-09-03 ENCOUNTER — Encounter: Payer: Self-pay | Admitting: Family Medicine

## 2023-09-04 NOTE — Telephone Encounter (Signed)
 Forwarding to Dr. Wendolyn to review and advise.

## 2023-09-10 ENCOUNTER — Encounter: Payer: Self-pay | Admitting: Family Medicine

## 2023-09-22 ENCOUNTER — Ambulatory Visit (INDEPENDENT_AMBULATORY_CARE_PROVIDER_SITE_OTHER): Admitting: Clinical

## 2023-09-22 DIAGNOSIS — F4323 Adjustment disorder with mixed anxiety and depressed mood: Secondary | ICD-10-CM

## 2023-09-22 NOTE — Progress Notes (Signed)
 Time: 12:05 pm-1:00 pm CPT Code: 09162E Diagnosis:  F43.23  [name] was seen in person for individual therapy. Session focused on processing a recent get-together with her sister. She reported feelings of exhaustion and frustration with herself for not taking steps toward self-care. Therapist suggested exploring cutting back on drinking, and she agreed to try this. Therapist cautioned that there is a chance doing so too quickly could result in withdrawal symptoms, and if she notices this, to stop and reach out to her PCP. She is scheduled to be seen again in one month.  Treatment Plan Client Abilities/Strengths  [name] described herself as perserverant, receptive to suggestions.  Client Treatment Preferences: [name] shared that she prefers afternoon appointments, and would like to alternate virtual vs in person sessions.  Client Statement of Needs  [name] shared that she would like to process past events in order to reduce their impact on her current functioning and move forward. She shared concerns that she may have symptoms of PTSD. Treatment Level   [name] would like to start with weekly appointments  Symptoms  [name] shared that she self-medicates with alcohol and drinks more than she would like to (up to a bottle of wine per day), fatigue, lack of purpose (which she attributes in part to limitations resulting from cancer) Problems Addressed  [name] would like to address and reduce the frequency and intensity of memories of troubling experiences.  Goals 1. [name] would like to process past events in order to move forward in her life as it is now Objective [name] would like to feel less bothered and controlled by past events   Target Date: 05/21/2024 Frequency: Weekly  Progress: 0 Modality: Individual   Related Interventions [name] will be provided with opportunities to process experiences in session Therapist to notice and disengage from maladaptive thoughts and behaviors using CBT based strategies  [name]  will process events from her past and explore their impact on her functioning Therapist will provide referrals for additional resources as appropriate       Jessica Tedesco L Hershall Benkert, PhD               Bandy Honaker L Kandis Henry, PhD

## 2023-09-23 ENCOUNTER — Other Ambulatory Visit: Payer: Self-pay | Admitting: Family Medicine

## 2023-09-23 DIAGNOSIS — R7989 Other specified abnormal findings of blood chemistry: Secondary | ICD-10-CM

## 2023-09-23 DIAGNOSIS — D61818 Other pancytopenia: Secondary | ICD-10-CM

## 2023-09-23 MED ORDER — SIMVASTATIN 40 MG PO TABS: 40.0000 mg | ORAL_TABLET | Freq: Every day | ORAL | 3 refills | Status: DC

## 2023-09-23 NOTE — Telephone Encounter (Signed)
 Copied from CRM [id number]. Topic: Clinical - Medication Refill >> Sep 23, 2023 10:12 AM Dedra B wrote: Medication: simvastatin  (ZOCOR ) 40 MG tablet  Has the patient contacted their pharmacy? Yes Pharmacy said that since she previously had it sent to [facility] in [place], she needs a new prescription.  This is the patient's preferred pharmacy:   [facility] [address], [place] - [address] [place] [address] Phone: [phone] Fax: [phone]  Is this the correct pharmacy for this prescription? Yes   Has the prescription been filled recently? No  Is the patient out of the medication? No, 1 left  Has the patient been seen for an appointment in the last year OR does the patient have an upcoming appointment? Yes  Can we respond through MyChart? Yes  Agent: Please be advised that Rx refills may take up to 3 business days. We ask that you follow-up with your pharmacy.

## 2023-09-23 NOTE — Telephone Encounter (Unsigned)
 Copied from CRM [id number]. Topic: Clinical - Medication Refill >> Sep 23, 2023 10:12 AM Dedra B wrote: Medication: simvastatin  (ZOCOR ) 40 MG tablet  Has the patient contacted their pharmacy? Yes Pharmacy said that since she previously had it sent to [facility] in [place], she needs a new prescription.  This is the patient's preferred pharmacy:   [facility] [address], [place] - [address] [place] [address] Phone: [phone] Fax: [phone]  Is this the correct pharmacy for this prescription? Yes   Has the prescription been filled recently? No  Is the patient out of the medication? No, 1 left  Has the patient been seen for an appointment in the last year OR does the patient have an upcoming appointment? Yes  Can we respond through MyChart? Yes  Agent: Please be advised that Rx refills may take up to 3 business days. We ask that you follow-up with your pharmacy.

## 2023-09-26 ENCOUNTER — Other Ambulatory Visit: Payer: Self-pay | Admitting: Family Medicine

## 2023-09-26 DIAGNOSIS — E119 Type 2 diabetes mellitus without complications: Secondary | ICD-10-CM

## 2023-10-05 ENCOUNTER — Other Ambulatory Visit: Payer: Medicare Other

## 2023-10-06 DIAGNOSIS — H9042 Sensorineural hearing loss, unilateral, left ear, with unrestricted hearing on the contralateral side: Secondary | ICD-10-CM | POA: Diagnosis not present

## 2023-10-13 ENCOUNTER — Ambulatory Visit: Payer: Medicare Other | Admitting: Hematology and Oncology

## 2023-10-14 ENCOUNTER — Encounter (INDEPENDENT_AMBULATORY_CARE_PROVIDER_SITE_OTHER): Admitting: Ophthalmology

## 2023-10-14 DIAGNOSIS — H353231 Exudative age-related macular degeneration, bilateral, with active choroidal neovascularization: Secondary | ICD-10-CM | POA: Diagnosis not present

## 2023-10-14 DIAGNOSIS — H35033 Hypertensive retinopathy, bilateral: Secondary | ICD-10-CM

## 2023-10-14 DIAGNOSIS — H43813 Vitreous degeneration, bilateral: Secondary | ICD-10-CM | POA: Diagnosis not present

## 2023-10-14 DIAGNOSIS — I10 Essential (primary) hypertension: Secondary | ICD-10-CM

## 2023-10-27 ENCOUNTER — Encounter: Payer: Self-pay | Admitting: Family Medicine

## 2023-10-27 ENCOUNTER — Ambulatory Visit: Admitting: Family Medicine

## 2023-10-27 ENCOUNTER — Ambulatory Visit: Admitting: Clinical

## 2023-10-27 VITALS — BP 130/78 | HR 73 | Temp 97.6°F | Ht 65.0 in | Wt 198.0 lb

## 2023-10-27 DIAGNOSIS — Z7984 Long term (current) use of oral hypoglycemic drugs: Secondary | ICD-10-CM | POA: Diagnosis not present

## 2023-10-27 DIAGNOSIS — H353231 Exudative age-related macular degeneration, bilateral, with active choroidal neovascularization: Secondary | ICD-10-CM | POA: Insufficient documentation

## 2023-10-27 DIAGNOSIS — D61818 Other pancytopenia: Secondary | ICD-10-CM

## 2023-10-27 DIAGNOSIS — R7989 Other specified abnormal findings of blood chemistry: Secondary | ICD-10-CM

## 2023-10-27 DIAGNOSIS — I10 Essential (primary) hypertension: Secondary | ICD-10-CM | POA: Diagnosis not present

## 2023-10-27 DIAGNOSIS — E1169 Type 2 diabetes mellitus with other specified complication: Secondary | ICD-10-CM

## 2023-10-27 DIAGNOSIS — E119 Type 2 diabetes mellitus without complications: Secondary | ICD-10-CM

## 2023-10-27 DIAGNOSIS — Z23 Encounter for immunization: Secondary | ICD-10-CM | POA: Diagnosis not present

## 2023-10-27 DIAGNOSIS — E785 Hyperlipidemia, unspecified: Secondary | ICD-10-CM

## 2023-10-27 DIAGNOSIS — F411 Generalized anxiety disorder: Secondary | ICD-10-CM | POA: Diagnosis not present

## 2023-10-27 DIAGNOSIS — I272 Pulmonary hypertension, unspecified: Secondary | ICD-10-CM | POA: Insufficient documentation

## 2023-10-27 LAB — CBC WITH DIFFERENTIAL/PLATELET
Basophils Absolute: 0 K/uL (ref 0.0–0.1)
Basophils Relative: 0.1 % (ref 0.0–3.0)
Eosinophils Absolute: 0.1 K/uL (ref 0.0–0.7)
Eosinophils Relative: 1.6 % (ref 0.0–5.0)
HCT: 37.4 % (ref 36.0–46.0)
Hemoglobin: 12.3 g/dL (ref 12.0–15.0)
Lymphocytes Relative: 31.5 % (ref 12.0–46.0)
Lymphs Abs: 1.4 K/uL (ref 0.7–4.0)
MCHC: 32.8 g/dL (ref 30.0–36.0)
MCV: 96.9 fl (ref 78.0–100.0)
Monocytes Absolute: 0.4 K/uL (ref 0.1–1.0)
Monocytes Relative: 8 % (ref 3.0–12.0)
Neutro Abs: 2.7 K/uL (ref 1.4–7.7)
Neutrophils Relative %: 58.8 % (ref 43.0–77.0)
Platelets: 163 K/uL (ref 150.0–400.0)
RBC: 3.86 Mil/uL — ABNORMAL LOW (ref 3.87–5.11)
RDW: 15.4 % (ref 11.5–15.5)
WBC: 4.5 K/uL (ref 4.0–10.5)

## 2023-10-27 LAB — COMPREHENSIVE METABOLIC PANEL WITH GFR
ALT: 39 U/L — ABNORMAL HIGH (ref 0–35)
AST: 35 U/L (ref 0–37)
Albumin: 4.2 g/dL (ref 3.5–5.2)
Alkaline Phosphatase: 83 U/L (ref 39–117)
BUN: 25 mg/dL — ABNORMAL HIGH (ref 6–23)
CO2: 26 meq/L (ref 19–32)
Calcium: 10 mg/dL (ref 8.4–10.5)
Chloride: 106 meq/L (ref 96–112)
Creatinine, Ser: 1.01 mg/dL (ref 0.40–1.20)
GFR: 53.88 mL/min — ABNORMAL LOW (ref >60.00)
Glucose, Bld: 140 mg/dL — ABNORMAL HIGH (ref 70–99)
Potassium: 5 meq/L (ref 3.5–5.1)
Sodium: 141 meq/L (ref 135–145)
Total Bilirubin: 0.6 mg/dL (ref 0.2–1.2)
Total Protein: 7.2 g/dL (ref 6.0–8.3)

## 2023-10-27 LAB — LIPID PANEL
Cholesterol: 184 mg/dL (ref 0–200)
HDL: 77.2 mg/dL (ref >39.00)
LDL Cholesterol: 88 mg/dL (ref 0–99)
NonHDL: 106.65
Total CHOL/HDL Ratio: 2
Triglycerides: 93 mg/dL (ref 0.0–149.0)
VLDL: 18.6 mg/dL (ref 0.0–40.0)

## 2023-10-27 LAB — VITAMIN B12: Vitamin B-12: 553 pg/mL (ref 211–911)

## 2023-10-27 LAB — HEMOGLOBIN A1C: Hgb A1c MFr Bld: 7.2 % — ABNORMAL HIGH (ref 4.6–6.5)

## 2023-10-27 MED ORDER — SIMVASTATIN 40 MG PO TABS: 40.0000 mg | ORAL_TABLET | Freq: Every day | ORAL | 3 refills | Status: DC

## 2023-10-27 MED ORDER — BUPROPION HCL ER (XL) 150 MG PO TB24: 150.0000 mg | ORAL_TABLET | Freq: Every morning | ORAL | 1 refills | Status: DC

## 2023-10-27 MED ORDER — METFORMIN HCL 500 MG PO TABS: ORAL_TABLET | ORAL | 1 refills | Status: DC

## 2023-10-27 NOTE — Patient Instructions (Addendum)
 It was very nice to see you today!  Check sugars once weekly  Cut back on alcohol or get alcohol free.    PLEASE NOTE:  If you had any lab tests please let us  know if you have not heard back within a few days. You may see your results on MyChart before we have a chance to review them but we will give you a call once they are reviewed by us . If we ordered any referrals today, please let us  know if you have not heard from their office within the next week.   Please try these tips to maintain a healthy lifestyle:  Eat most of your calories during the day when you are active. Eliminate processed foods including packaged sweets (pies, cakes, cookies), reduce intake of potatoes, white bread, white pasta, and white rice. Look for whole grain options, oat flour or almond flour.  Each meal should contain half fruits/vegetables, one quarter protein, and one quarter carbs (no bigger than a computer mouse).  Cut down on sweet beverages. This includes juice, soda, and sweet tea. Also watch fruit intake, though this is a healthier sweet option, it still contains natural sugar! Limit to 3 servings daily.  Drink at least 1 glass of water with each meal and aim for at least 8 glasses per day  Exercise at least 150 minutes every week.

## 2023-10-27 NOTE — Progress Notes (Signed)
 Subjective:     Patient ID: [name], female    DOB: 01-17-1946, 78 y.o.   MRN: [id number]  Chief Complaint  Patient presents with   Follow-up    70mo; want to discuss the medication farxiga    Medication Refill    Simvastatin  and metformin     HPI Discussed the use of AI scribe software for clinical note transcription with the patient, who gave verbal consent to proceed.  History of Present Illness [name] is a 78 year old female with diabetes, hypertension, and hyperlipidemia who presents for medication management and follow-up.  She manages her diabetes with metformin , taking three tablets daily, but has not been checking her blood sugars regularly. She has not started Farxiga  due to cost concerns and is considering changing her insurance. She is also aware of the generic version, dapagliflozin , which may be more affordable. She is concerned about protein in her urine and is unsure if it is related to her diabetes or her history of multiple myeloma.  She has a history of multiple myeloma, with recent tests by her oncologist showing no signs of active disease. She remains concerned about proteinuria and its potential relation to her past condition.  For hyperlipidemia, she takes simvastatin  40 mg daily, along with vitamin B12 and vitamin D  supplements.  Her hypertension is managed with metoprolol , taking half of a 50 mg tablet, and losartan  25 mg. She occasionally checks her blood pressure, which has been around 130-140/80-90 mmHg. No headache, dizziness, chest pain, shortness of breath, or leg swelling.  She is on psychiatric medications including Wellbutrin  150 mg and venlafaxine  150 mg daily, taken in the morning. She experiences occasional anxiety. No suicidal thoughts. She has not been able to find a therapist for eye movement therapy, which she was interested in pursuing.  Elevated LFT's-She drinks a bottle of wine over a four-hour period per day, which she acknowledges  is excessive. She does not experience withdrawal symptoms and describes her drinking as a habit rather than a dependence.  She experiences occasional diarrhea, which she attributes to dietary choices, particularly after consuming greasy or sugary foods. She recalls a recent episode after eating [place] food.  She has a history of neuropathy, which has affected her ability to obtain reasonably priced insurance coverage. She reports having some calluses but maintains sensation in her feet.    Health Maintenance Due  Topic Date Due   COVID-19 Vaccine (6 - 2025-26 season) 09/28/2023    Past Medical History:  Diagnosis Date   Anxiety    Arthritis    Blood transfusion without reported diagnosis    Cataract    Chronic kidney disease    Noted by MD.  Not mentioned to me.   Closed fracture of fifth metacarpal bone of right hand 04/18/2022   Cough 01/11/2014   Depression    Diabetes type 2, controlled (HCC)    Esophageal reflux disease    Fracture of proximal phalanx of finger 03/26/2022   GERD (gastroesophageal reflux disease) 08/04/2012   Hypercholesterolemia    Hypertension    Ischemic colitis 01/28/2007   Liver enzyme elevation    Lung nodule 06/11/2012   Macular degeneration, wet (HCC)    Multiple myeloma (HCC) 05/27/2012   Cytogenetics on 06/08/2012 was normal 46, XX [20]   Osteoporosis    Other fatigue 02/08/2014   Oxygen  deficiency    Pancytopenia (HCC)    Pneumonia    Post-nasal drip 01/11/2014   Sepsis (HCC)  Shortness of breath 10/15/2022   Sleep apnea    does not use CPAP   Stroke [facility])    Ischemic colitis 2003?   Tuberculosis     (+ TB TEST)YEARS AGO POSSIBLE EXPOSURE WAS MED TX    Type 2 diabetes mellitus without complications (HCC) 01/31/2014    Past Surgical History:  Procedure Laterality Date   APPENDECTOMY  1998   BREAST BIOPSY     COLONOSCOPY  01/28/2011   poly; Dr. Celestia, next one due 2018.l   LEG SURGERY     OPEN REDUCTION INTERNAL FIXATION  (ORIF) HAND Left 04/29/2022   Procedure: OPEN REDUCTION INTERNAL FIXATION with percutaneous pinning, left ring finger proximal phalanx;  Surgeon: Alyse Agent, MD;  Location: [facility];  Service: Orthopedics;  Laterality: Left;  mac and regional   Stem cell  2015   TONSILLECTOMY     VIDEO BRONCHOSCOPY WITH ENDOBRONCHIAL NAVIGATION Right 07/08/2012   Procedure: VIDEO BRONCHOSCOPY WITH ENDOBRONCHIAL NAVIGATION;  Surgeon: Lamar GORMAN Chris, MD;  Location: [facility] OR;  Service: Thoracic;  Laterality: Right;     Current Outpatient Medications:    Blood Glucose Monitoring Suppl (ONETOUCH VERIO FLEX SYSTEM) w/Device KIT, 1 each by Does not apply route daily at 12 noon., Disp: 1 kit, Rfl: 1   buPROPion  (WELLBUTRIN  XL) 150 MG 24 hr tablet, Take 1 tablet (150 mg total) by mouth every morning., Disp: 90 tablet, Rfl: 1   cholecalciferol (VITAMIN D ) 1000 UNITS tablet, Take 2,000 Units by mouth daily., Disp: , Rfl:    ciclopirox  (PENLAC ) 8 % solution, Apply topically at bedtime. Apply over nail and surrounding skin. Apply daily over previous coat. After seven (7) days, remove with alcohol and continue cycle., Disp: 6.6 mL, Rfl: 12   cyanocobalamin  (VITAMIN B12) 1000 MCG tablet, Take 1,000 mcg by mouth daily., Disp: , Rfl:    dapagliflozin  propanediol (FARXIGA ) 5 MG TABS tablet, Take 1 tablet (5 mg total) by mouth daily before breakfast., Disp: 30 tablet, Rfl: 1   glucose blood (ONETOUCH VERIO) test strip, 1 each by Other route daily at 12 noon., Disp: 100 each, Rfl: 3   Lancets (ONETOUCH ULTRASOFT) lancets, 1 each by Other route daily at 12 noon. Use as instructed, Disp: 100 each, Rfl: 3   losartan  (COZAAR ) 25 MG tablet, Take 25 mg by mouth daily., Disp: , Rfl:    magnesium  chloride (SLOW-MAG) 64 MG TBEC SR tablet, Take 1 tablet by mouth 2 (two) times daily. , Disp: , Rfl:    metFORMIN  (GLUCOPHAGE ) 500 MG tablet, TAKE 1 TABLET BY MOUTH IN THE MORNING AND 2 TABLETS IN THE EVENING, Disp: 270 tablet,  Rfl: 1   metoprolol  succinate (TOPROL -XL) 50 MG 24 hr tablet, Take 0.5 tablets (25 mg total) by mouth daily., Disp: 90 tablet, Rfl: 1   Multiple Vitamins-Minerals (MULTIVITAMIN WITH MINERALS) tablet, Take 1 tablet by mouth at bedtime. , Disp: , Rfl:    Multiple Vitamins-Minerals (PRESERVISION AREDS 2) CAPS, Take 1 capsule by mouth 2 (two) times daily., Disp: , Rfl:    simvastatin  (ZOCOR ) 40 MG tablet, Take 1 tablet (40 mg total) by mouth at bedtime., Disp: 90 tablet, Rfl: 3   trimethoprim-polymyxin b (POLYTRIM) ophthalmic solution, Place into both eyes., Disp: , Rfl:    venlafaxine  XR (EFFEXOR -XR) 150 MG 24 hr capsule, TAKE 1 CAPSULE BY MOUTH EVERY DAY WITH FOOD FOR 30 DAYS, Disp: 90 capsule, Rfl: 1  No Known Allergies ROS neg/noncontributory except as noted HPI/below  Objective:     BP 130/78   Pulse 73   Temp 97.6 F (36.4 C)   Ht 5' 5 (1.651 m)   Wt 198 lb (89.8 kg)   SpO2 96%   BMI 32.95 kg/m  Wt Readings from Last 3 Encounters:  10/27/23 198 lb (89.8 kg)  08/20/23 196 lb 6.4 oz (89.1 kg)  07/30/23 196 lb (88.9 kg)    Physical Exam   Gen: WDWN NAD HEENT: NCAT, conjunctiva not injected, sclera nonicteric NECK:  supple, no thyromegaly, no nodes, no carotid bruits CARDIAC: RRR, S1S2+, no murmur. DP 1+B LUNGS: CTAB. No wheezes ABDOMEN:  BS+, soft, NTND, No HSM, no masses EXT:  no edema MSK: no gross abnormalities.  NEURO: A&O x3.  CN II-XII intact. Lip tremors PSYCH: normal mood. Good eye contact  Diabetic Foot Exam - Simple   Simple Foot Form Diabetic Foot exam was performed with the following findings: Yes 10/27/2023  2:13 PM  Visual Inspection See comments: Yes Sensation Testing Intact to touch and monofilament testing bilaterally: Yes Pulse Check Posterior Tibialis and Dorsalis pulse intact bilaterally: Yes Comments Calluses and thick nails         Assessment & Plan:  Type 2 diabetes mellitus without complication, without long-term current use of  insulin  (HCC) -     metFORMIN  HCl; TAKE 1 TABLET BY MOUTH IN THE MORNING AND 2 TABLETS IN THE EVENING  Dispense: 270 tablet; Refill: 1 -     Comprehensive metabolic panel with GFR -     Hemoglobin A1c -     Vitamin B12  Hyperlipidemia associated with type 2 diabetes mellitus (HCC) -     Comprehensive metabolic panel with GFR -     Lipid panel  Essential hypertension -     CBC with Differential/Platelet -     Comprehensive metabolic panel with GFR  Other pancytopenia (HCC) -     Simvastatin ; Take 1 tablet (40 mg total) by mouth at bedtime.  Dispense: 90 tablet; Refill: 3  Abnormal LFTs -     Simvastatin ; Take 1 tablet (40 mg total) by mouth at bedtime.  Dispense: 90 tablet; Refill: 3 -     Comprehensive metabolic panel with GFR  GAD (generalized anxiety disorder) -     buPROPion  HCl ER (XL); Take 1 tablet (150 mg total) by mouth every morning.  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 Proteinuria is present, with unclear etiology between diabetes or multiple myeloma. Recent oncologist evaluation showed no signs of multiple myeloma activity. Continue metformin , 3 tablets daily. Advise weekly blood glucose monitoring. Discuss insurance and medication cost options for dapagliflozin  (Farxiga ).-for now, too expensive  Abnormal liver function tests   Likely related to excessive alcohol use, diabetes, and medication use. Liver tests have been slightly elevated in the past but are now more significantly elevated. Order repeat liver function tests. Advise reduction in alcohol consumption.  Alcohol use, excessive   Consuming up to a bottle of wine daily. Acknowledges need to reduce intake due to potential liver damage and interaction with psychiatric medications. No signs of psychological dependence or  withdrawal symptoms reported. Advise to reduce alcohol intake to one glass per day, occasionally two. Consider nonalcoholic wine or beer as alternatives.  Essential hypertension   Managed with metoprolol  and losartan .  Blood pressure readings fluctuate between 130-140/80s, with no readings above 150. No associated symptoms such as headache, dizziness, chest pain, or shortness of breath reported. Continue metoprolol , 25 mg daily. Continue losartan , 25 mg daily. Monitor blood pressure regularly.  Hyperlipidemia   Managed with simvastatin , 40 mg daily. Continue simvastatin , 40 mg daily.  Depression   Managed with bupropion  and venlafaxine . Mood is fairly well controlled with occasional anxiety. No suicidal ideation reported. Finds talk therapy unhelpful and is unable to access eye movement therapy due to lack of availability. Continue bupropion , 150 mg daily. Continue venlafaxine , 150 mg daily. Monitor mood and anxiety levels.    Return in about 3 months (around 01/26/2024) for chronic follow-up.  Jenkins CHRISTELLA Carrel, MD

## 2023-10-28 ENCOUNTER — Ambulatory Visit: Payer: Self-pay | Admitting: Family Medicine

## 2023-10-28 NOTE — Progress Notes (Signed)
 Labs stable.  Sugars could be a little better.

## 2023-11-25 ENCOUNTER — Ambulatory Visit: Admitting: Podiatry

## 2023-11-25 ENCOUNTER — Encounter (INDEPENDENT_AMBULATORY_CARE_PROVIDER_SITE_OTHER): Admitting: Ophthalmology

## 2023-11-25 DIAGNOSIS — I10 Essential (primary) hypertension: Secondary | ICD-10-CM

## 2023-11-25 DIAGNOSIS — H35033 Hypertensive retinopathy, bilateral: Secondary | ICD-10-CM

## 2023-11-25 DIAGNOSIS — H353231 Exudative age-related macular degeneration, bilateral, with active choroidal neovascularization: Secondary | ICD-10-CM | POA: Diagnosis not present

## 2023-11-25 DIAGNOSIS — H43813 Vitreous degeneration, bilateral: Secondary | ICD-10-CM | POA: Diagnosis not present

## 2023-12-14 DIAGNOSIS — Z23 Encounter for immunization: Secondary | ICD-10-CM | POA: Diagnosis not present

## 2024-01-06 ENCOUNTER — Encounter (INDEPENDENT_AMBULATORY_CARE_PROVIDER_SITE_OTHER): Admitting: Ophthalmology

## 2024-01-06 DIAGNOSIS — H35033 Hypertensive retinopathy, bilateral: Secondary | ICD-10-CM

## 2024-01-06 DIAGNOSIS — H353231 Exudative age-related macular degeneration, bilateral, with active choroidal neovascularization: Secondary | ICD-10-CM

## 2024-01-06 DIAGNOSIS — I10 Essential (primary) hypertension: Secondary | ICD-10-CM

## 2024-01-06 DIAGNOSIS — H43813 Vitreous degeneration, bilateral: Secondary | ICD-10-CM | POA: Diagnosis not present

## 2024-01-12 ENCOUNTER — Ambulatory Visit: Payer: Self-pay

## 2024-01-12 NOTE — Telephone Encounter (Signed)
 FYI Only or Action Required?: Action required by provider: request for appointment.- next available 01/19/24, pt only wants Dr. Wendolyn and states that is not going to work. RN advised would sent to see if and where they could schedule her sooner given increased depression.   Patient was last seen in primary care on 10/27/2023 by Wendolyn Jenkins Jansky, MD.  Called Nurse Triage reporting Depression.  Symptoms began unknown.  Interventions attempted: Nothing.  Symptoms are: gradually worsening.  Triage Disposition: See Physician Within 24 Hours  Patient/caregiver understands and will follow disposition?: Yes    Copied from CRM #[id number]. Topic: Clinical - Red Word Triage >> Jan 12, 2024  4:21 PM Roselie BROCKS wrote: Red Word that prompted transfer to Nurse Triage: Patient states she needs to speak about possibly changing her depression meds, seems one's  she is on is not helping. Patient is not eating, wanting to sleep all the time. Depressed state Reason for Disposition  [1] Depression AND [2] getting worse (e.g., sleeping poorly, less able to do activities of daily living)  Answer Assessment - Initial Assessment Questions Pt upset she was transferred to nurse triage and then upset about questions asked and that she felt like information wasn't being communicated from one person the the next. RN advised that the information is there, just like to hear from patient personally. She said the information is there. Advised she would like to discuss depression medication and some health concerns. Pt denies any thoughts of harming herself or others. She states she is also sleeping too much. Has slept all day and most of the night multiple days now.  Pt denies any symptoms currently like chest pain or shortness of breath, vomiting, diarrhea. She states she has diarrhea on "Sunday.  Pt states she is not having thought of harming herself and if she did, she wouldn't be calling in, she'd be killing herself or  driving to the ER. Pt only wants to see Dr. Kulik.    1. CONCERN: What happened that made you call today?     Discuss depression meds and health concerns. 2. DEPRESSION SYMPTOM SCREENING: How are you feeling overall? (e.g., decreased energy, increased sleeping or difficulty sleeping, difficulty concentrating, feelings of sadness, guilt, hopelessness, or worthlessness)     Increased sleeping.  3. RISK OF HARM - SUICIDAL IDEATION:  Do you ever have thoughts of hurting or killing yourself?  (e.g., yes, no, no but preoccupation with thoughts about death)     denies 4. RISK OF HARM - HOMICIDAL IDEATION:  Do you ever have thoughts of hurting or killing someone else?  (e.g., yes, no, no but preoccupation with thoughts about death)     denies 5. FUNCTIONAL IMPAIRMENT: How have things been going for you overall? Have you had more difficulty than usual doing your normal daily activities?  (e.g., better, same, worse; self-care, school, work, interactions)     Sleeping  6. SUPPORT: Who is with you now? Who do you live with? Do you have family or friends who you can talk to?   7. THERAPIST: Do you have a counselor or therapist? If Yes, ask: What is their name?   8. STRESSORS: Has there been any new stress or recent changes in your life?      9. ALCOHOL USE OR SUBSTANCE USE (DRUG USE): Do you drink alcohol or use any illegal drugs?      10" . OTHER: Do you have any other physical symptoms right now? (e.g., fever)  no  Protocols used: Depression-A-AH

## 2024-01-13 NOTE — Telephone Encounter (Signed)
 Please see call note and advise if able to see patient sooner than scheduled appt

## 2024-01-14 ENCOUNTER — Ambulatory Visit: Payer: Self-pay

## 2024-01-14 NOTE — Telephone Encounter (Signed)
 FYI Only or Action Required?: Action required by provider: request for appointment.  Patient was last seen in primary care on 10/27/2023 by Wendolyn Jenkins Jansky, MD.  Called Nurse Triage reporting Advice Only.  Symptoms began several weeks ago.  Interventions attempted: Other: n/a .  Symptoms are: better today.  Triage Disposition: Call PCP Within 24 Hours  Patient/caregiver understands and will follow disposition?: Yes   Copied from CRM #[id number]. Topic: Clinical - Red Word Triage >> Jan 12, 2024  4:21 PM Roselie BROCKS wrote: Red Word that prompted transfer to Nurse Triage: Patient states she needs to speak about possibly changing her depression meds, seems one's  she is on is not helping. Patient is not eating, wanting to sleep all the time. Depressed state >> Jan 14, 2024  5:20 PM China J wrote: Patient is returning a call from Dr. Wendolyn to proceed with scheduling due to her depression. Reason for Disposition  [1] Follow-up call from patient regarding patient's clinical status AND [2] information NON-URGENT  Answer Assessment - Initial Assessment Questions Pt is returning call from the office staff to schedule. Note from Dr. Wendolyn said to add for today ( 01/14/24) but she called after time. She states she doesn't want to be squeezed in the schedule if there is not a slot. She states she feels better today than when she talked to triage RN the other day. She would like a call back. She states she has to take her car in to be serviced tomorrow and gives permission for message to be left on her voicemail.     1. REASON FOR CALL or QUESTION: What is your reason for calling today? or How can I best     Returning call 2. CALLER: Document the source of call. (e.g., laboratory staff, caregiver or patient).     Patient  Protocols used: PCP Call - No Triage-A-AH

## 2024-01-14 NOTE — Telephone Encounter (Signed)
 Called LVM to get sche please tranfer to front office. aw

## 2024-01-14 NOTE — Telephone Encounter (Signed)
 Please see response from provider, contact pt to schedule

## 2024-01-15 NOTE — Telephone Encounter (Signed)
 LVM to schedule with Wendolyn on 01/18/24. Please transfer to [employer]

## 2024-01-15 NOTE — Telephone Encounter (Signed)
 Pt scheduled for 01/27/24, but if any other openings before scheduled please call and offer to patient

## 2024-01-15 NOTE — Telephone Encounter (Signed)
 Please see triage note regarding patient and appt for depression

## 2024-01-18 ENCOUNTER — Ambulatory Visit: Admitting: Family Medicine

## 2024-01-18 ENCOUNTER — Encounter: Payer: Self-pay | Admitting: Family Medicine

## 2024-01-18 VITALS — BP 128/84 | HR 78 | Temp 97.0°F | Ht 65.0 in | Wt 196.0 lb

## 2024-01-18 DIAGNOSIS — G4733 Obstructive sleep apnea (adult) (pediatric): Secondary | ICD-10-CM

## 2024-01-18 DIAGNOSIS — E119 Type 2 diabetes mellitus without complications: Secondary | ICD-10-CM | POA: Diagnosis not present

## 2024-01-18 DIAGNOSIS — C9001 Multiple myeloma in remission: Secondary | ICD-10-CM

## 2024-01-18 DIAGNOSIS — F411 Generalized anxiety disorder: Secondary | ICD-10-CM

## 2024-01-18 DIAGNOSIS — F339 Major depressive disorder, recurrent, unspecified: Secondary | ICD-10-CM

## 2024-01-18 DIAGNOSIS — Z7984 Long term (current) use of oral hypoglycemic drugs: Secondary | ICD-10-CM | POA: Diagnosis not present

## 2024-01-18 MED ORDER — BUPROPION HCL ER (XL) 150 MG PO TB24: 300.0000 mg | ORAL_TABLET | Freq: Every morning | ORAL | 1 refills | Status: DC

## 2024-01-18 MED ORDER — NALTREXONE HCL 50 MG PO TABS: 50.0000 mg | ORAL_TABLET | Freq: Every day | ORAL | 1 refills | Status: DC

## 2024-01-18 NOTE — Progress Notes (Signed)
 "  Subjective:     Patient ID: [name], female    DOB: 02-20-1945, 78 y.o.   MRN: [id number]  Chief Complaint  Patient presents with   Diabetes   Hypertension    Pt is here for chronic issues    Discussed the use of AI scribe software for clinical note transcription with the patient, who gave verbal consent to proceed.  History of Present Illness [name] is a 78 year old female with depression and sleep apnea who presents with worsening depression and excessive sleep.  She experiences worsening depression, characterized by excessive sleep and lack of energy. She sleeps from Friday night until Sunday morning with minimal interruptions, only waking briefly to use the bathroom. She feels 'bleh about everything' and lacks stamina. No suicidal ideation, but she has thoughts of death, attributing it to her age.  She reports significant sleep disturbances, waking multiple times during the night. She has not been wearing her sleep apnea prescribed mouth guard for the past six months due to discomfort. She consumes a bottle of wine daily, which may contribute to her sleep issues.  She experiences frequent upset stomach and diarrhea, approximately once every two weeks, sometimes related to food intake.  Her current medications include losartan , and she has been prescribed Farxiga , which she finds expensive. She is also on Effexor  (venlafaxine ) 150 mg.  Socially, she feels isolated, with limited family interaction. Her sister declined a Christmas invitation, and her brother is distant. She has a strained relationship with her younger son, who has not spoken to her for six months. She describes alcohol as her 'companion' and acknowledges a family history of alcoholism.  DM-on metformin  3/d.  Will start farxiga  after 1/1 d/t insurance.  Some microalbuminuria-already on losartan .    There are no preventive care reminders to display for this patient.  Past Medical History:  Diagnosis  Date   Anxiety    Arthritis    Blood transfusion without reported diagnosis    Cataract    Chronic kidney disease    Noted by MD.  Not mentioned to me.   Closed fracture of fifth metacarpal bone of right hand 04/18/2022   Cough 01/11/2014   Depression    Diabetes type 2, controlled (HCC)    Esophageal reflux disease    Fracture of proximal phalanx of finger 03/26/2022   GERD (gastroesophageal reflux disease) 08/04/2012   Hypercholesterolemia    Hypertension    Ischemic colitis 01/28/2007   Liver enzyme elevation    Lung nodule 06/11/2012   Macular degeneration, wet (HCC)    Multiple myeloma (HCC) 05/27/2012   Cytogenetics on 06/08/2012 was normal 46, XX [20]   Osteoporosis    Other fatigue 02/08/2014   Oxygen  deficiency    Pancytopenia (HCC)    Pneumonia    Post-nasal drip 01/11/2014   Sepsis (HCC)    Shortness of breath 10/15/2022   Sleep apnea    does not use CPAP   Stroke [facility])    Ischemic colitis 2003?   Tuberculosis     (+ TB TEST)YEARS AGO POSSIBLE EXPOSURE WAS MED TX    Type 2 diabetes mellitus without complications (HCC) 01/31/2014    Past Surgical History:  Procedure Laterality Date   APPENDECTOMY  1998   BREAST BIOPSY     COLONOSCOPY  01/28/2011   poly; Dr. Celestia, next one due 2018.l   LEG SURGERY     OPEN REDUCTION INTERNAL FIXATION (ORIF) HAND Left 04/29/2022   Procedure:  OPEN REDUCTION INTERNAL FIXATION with percutaneous pinning, left ring finger proximal phalanx;  Surgeon: Alyse Agent, MD;  Location: [facility];  Service: Orthopedics;  Laterality: Left;  mac and regional   Stem cell  2015   TONSILLECTOMY     VIDEO BRONCHOSCOPY WITH ENDOBRONCHIAL NAVIGATION Right 07/08/2012   Procedure: VIDEO BRONCHOSCOPY WITH ENDOBRONCHIAL NAVIGATION;  Surgeon: Lamar GORMAN Chris, MD;  Location: [facility] OR;  Service: Thoracic;  Laterality: Right;    Current Medications[1]  Allergies[2] ROS neg/noncontributory except as noted HPI/below      Objective:      BP 128/84 (BP Location: Left Arm, Patient Position: Sitting)   Pulse 78   Temp (!) 97 F (36.1 C) (Temporal)   Ht 5' 5 (1.651 m)   Wt 196 lb (88.9 kg)   SpO2 98%   BMI 32.62 kg/m  Wt Readings from Last 3 Encounters:  01/18/24 196 lb (88.9 kg)  10/27/23 198 lb (89.8 kg)  08/20/23 196 lb 6.4 oz (89.1 kg)    Physical Exam GENERAL: Well developed well nourished no acute distress HEAD EYES EARS NOSE THROAT: Normocephalic atraumatic, conjunctiva not injected, sclera nonicteric CARDIAC: Regular rate and rhythm, S1 S2 present , no murmur, dorsalis pedis 1 plus bilaterally NECK: Supple, no thyromegaly, no nodes, no carotid bruits LUNGS: Clear to auscultation bilaterally, no wheezes ABDOMEN: Bowel sounds present, soft, non tender non distended, no hepatosplenomegaly, no masses EXTREMITIES: No edema MUSCULOSKELETAL: No gross abnormalities NEUROLOGICAL: Alert and oriented x3, cranial nerves II through XII intact PSYCHIATRIC: Normal mood, good eye contact       Assessment & Plan:  Depression, recurrent  GAD (generalized anxiety disorder) -     buPROPion  HCl ER (XL); Take 2 tablets (300 mg total) by mouth every morning.  Dispense: 180 tablet; Refill: 1  Obstructive sleep apnea syndrome  Type 2 diabetes mellitus without complication, without long-term current use of insulin  (HCC)  Multiple myeloma in remission (HCC)  Other orders -     Naltrexone  HCl; Take 1 tablet (50 mg total) by mouth daily.  Dispense: 30 tablet; Refill: 1    Assessment and Plan Assessment & Plan Major depressive disorder, recurrent   She experiences increased depressive symptoms, including excessive sleep, lack of energy, and anhedonia, without suicidal ideation. Current treatment with venlafaxine  150 mg continues. Bupropion  is increased to 300 mg daily, starting with two 150 mg tablets, to address fatigue and low motivation. Monitor for increased anxiety as a potential side effect. Increased sunlight  exposure is encouraged to improve mood. A follow-up is scheduled in 2-3 weeks to assess treatment response.  Alcohol use disorder   She consumes approximately one bottle of wine daily, which may contribute to sleep disturbances and depressive symptoms. There is a family history of alcoholism. Naltrexone  is started, beginning with half a tablet daily for one week, then increasing to a full tablet, to reduce cravings. Reduction in alcohol consumption is encouraged.  Obstructive sleep apnea   Non-compliance with CPAP therapy is due to discomfort with the mouth guard. Sleep disturbances are likely exacerbated by alcohol use and sleep apnea. She is encouraged to use the mouth guard for sleep apnea management. The potential benefits of CPAP therapy are discussed if the mouth guard remains uncomfortable.  Generalized anxiety disorder   There is potential exacerbation of anxiety with the increased bupropion  dosage. Monitor for increased anxiety.  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 Farxiga  is discussed for  additional kidney protection, but cost is a concern. Farxiga  is deferred until the cost is more manageable. Continue losartan  for kidney protection.  Inactive multiple myeloma-managed by onc  Fatigue/restless sleep-multifactorial-worsening depression, holidays, social isolation, meds, OSA, time change, winter, etc.  Discussed sleep hygiene.  Wear mouth guard. See above plan for depression     Return for cancel the 12/31 and do 2-3 wks for reg f/u.  Jenkins CHRISTELLA Carrel, MD     [1]  Current Outpatient Medications:    Blood Glucose Monitoring Suppl (ONETOUCH VERIO FLEX SYSTEM) w/Device KIT, 1 each by Does not apply route daily at 12 noon., Disp: 1 kit, Rfl: 1   cholecalciferol (VITAMIN D ) 1000 UNITS tablet, Take 2,000 Units by mouth daily., Disp: , Rfl:    ciclopirox  (PENLAC ) 8 % solution, Apply topically at bedtime. Apply  over nail and surrounding skin. Apply daily over previous coat. After seven (7) days, remove with alcohol and continue cycle., Disp: 6.6 mL, Rfl: 12   cyanocobalamin  (VITAMIN B12) 1000 MCG tablet, Take 1,000 mcg by mouth daily., Disp: , Rfl:    glucose blood (ONETOUCH VERIO) test strip, 1 each by Other route daily at 12 noon., Disp: 100 each, Rfl: 3   Lancets (ONETOUCH ULTRASOFT) lancets, 1 each by Other route daily at 12 noon. Use as instructed, Disp: 100 each, Rfl: 3   losartan  (COZAAR ) 25 MG tablet, Take 25 mg by mouth daily., Disp: , Rfl:    magnesium  chloride (SLOW-MAG) 64 MG TBEC SR tablet, Take 1 tablet by mouth 2 (two) times daily. , Disp: , Rfl:    metFORMIN  (GLUCOPHAGE ) 500 MG tablet, TAKE 1 TABLET BY MOUTH IN THE MORNING AND 2 TABLETS IN THE EVENING, Disp: 270 tablet, Rfl: 1   metoprolol  succinate (TOPROL -XL) 50 MG 24 hr tablet, Take 0.5 tablets (25 mg total) by mouth daily., Disp: 90 tablet, Rfl: 1   Multiple Vitamins-Minerals (MULTIVITAMIN WITH MINERALS) tablet, Take 1 tablet by mouth at bedtime. , Disp: , Rfl:    Multiple Vitamins-Minerals (PRESERVISION AREDS 2) CAPS, Take 1 capsule by mouth 2 (two) times daily., Disp: , Rfl:    naltrexone  (DEPADE) 50 MG tablet, Take 1 tablet (50 mg total) by mouth daily., Disp: 30 tablet, Rfl: 1   simvastatin  (ZOCOR ) 40 MG tablet, Take 1 tablet (40 mg total) by mouth at bedtime., Disp: 90 tablet, Rfl: 3   trimethoprim-polymyxin b (POLYTRIM) ophthalmic solution, Place into both eyes., Disp: , Rfl:    venlafaxine  XR (EFFEXOR -XR) 150 MG 24 hr capsule, TAKE 1 CAPSULE BY MOUTH EVERY DAY WITH FOOD FOR 30 DAYS, Disp: 90 capsule, Rfl: 1   buPROPion  (WELLBUTRIN  XL) 150 MG 24 hr tablet, Take 2 tablets (300 mg total) by mouth every morning., Disp: 180 tablet, Rfl: 1   dapagliflozin  propanediol (FARXIGA ) 5 MG TABS tablet, Take 1 tablet (5 mg total) by mouth daily before breakfast. (Patient not taking: Reported on 01/18/2024), Disp: 30 tablet, Rfl: 1 [2] No Known  Allergies  "

## 2024-01-18 NOTE — Patient Instructions (Signed)
 Wear our mouth guard for sleep apnea Increase the wellbutrin /bupropion  to 300mg  daily so take 2 of the 150's in the morning  Cut back on the alcohol-so start naltrexone  1/2 tab per day for 1 week then the whole tab.   Get more light during the day.

## 2024-01-27 ENCOUNTER — Ambulatory Visit: Admitting: Family Medicine

## 2024-01-29 ENCOUNTER — Encounter: Payer: Self-pay | Admitting: Family Medicine

## 2024-01-29 ENCOUNTER — Other Ambulatory Visit: Payer: Self-pay

## 2024-01-29 DIAGNOSIS — R7989 Other specified abnormal findings of blood chemistry: Secondary | ICD-10-CM

## 2024-01-29 DIAGNOSIS — F411 Generalized anxiety disorder: Secondary | ICD-10-CM

## 2024-01-29 DIAGNOSIS — I10 Essential (primary) hypertension: Secondary | ICD-10-CM

## 2024-01-29 DIAGNOSIS — E119 Type 2 diabetes mellitus without complications: Secondary | ICD-10-CM

## 2024-01-29 DIAGNOSIS — D61818 Other pancytopenia: Secondary | ICD-10-CM

## 2024-01-29 MED ORDER — METOPROLOL SUCCINATE ER 50 MG PO TB24: 25.0000 mg | ORAL_TABLET | Freq: Every day | ORAL | 1 refills | Status: AC

## 2024-01-29 MED ORDER — METFORMIN HCL 500 MG PO TABS: ORAL_TABLET | ORAL | 1 refills | Status: AC

## 2024-01-29 MED ORDER — SIMVASTATIN 40 MG PO TABS: 40.0000 mg | ORAL_TABLET | Freq: Every day | ORAL | 3 refills | Status: AC

## 2024-01-29 MED ORDER — DAPAGLIFLOZIN PROPANEDIOL 5 MG PO TABS: 5.0000 mg | ORAL_TABLET | Freq: Every day | ORAL | 1 refills | Status: AC

## 2024-01-29 MED ORDER — LOSARTAN POTASSIUM 25 MG PO TABS: 25.0000 mg | ORAL_TABLET | Freq: Every day | ORAL | 3 refills | Status: AC

## 2024-01-29 MED ORDER — VENLAFAXINE HCL ER 150 MG PO CP24: ORAL_CAPSULE | ORAL | 1 refills | Status: AC

## 2024-01-29 MED ORDER — NALTREXONE HCL 50 MG PO TABS: 50.0000 mg | ORAL_TABLET | Freq: Every day | ORAL | 1 refills | Status: AC

## 2024-01-29 MED ORDER — BUPROPION HCL ER (XL) 150 MG PO TB24: 300.0000 mg | ORAL_TABLET | Freq: Every morning | ORAL | 1 refills | Status: AC

## 2024-01-29 NOTE — Telephone Encounter (Signed)
 All medication has been sent to [facility].

## 2024-02-03 ENCOUNTER — Ambulatory Visit: Admitting: Family Medicine

## 2024-02-09 ENCOUNTER — Ambulatory Visit: Payer: Self-pay

## 2024-02-09 NOTE — Telephone Encounter (Signed)
 FYI Only or Action Required?: FYI only for provider: going to urgent care.  Patient was last seen in primary care on 01/18/2024 by Jessica Jenkins Jansky, MD.  Called Nurse Triage reporting Dysuria, Urinary Frequency, and Hematuria.  Symptoms began several days ago.  Interventions attempted: Nothing.  Symptoms are: gradually improving.  Triage Disposition: See HCP Within 4 Hours (Or PCP Triage)  Patient/caregiver understands and will follow disposition?: Unsure            Copied from CRM #[id number]. Topic: Clinical - Red Word Triage >> Feb 09, 2024  1:36 PM [name] wrote: Red Word that prompted transfer to Nurse Triage: blood in urine,frequent urination Reason for Disposition  Diabetes mellitus or weak immune system (e.g., HIV positive, cancer chemo, splenectomy, organ transplant, chronic steroids)  Answer Assessment - Initial Assessment Questions Patient upset before RN can introduce herself or ask for name and DOB, she doesn't understand why she has to keep repeating her name and DOB and why it takes so much to schedule and appointment with her PCP. She wants an appt with Dr Jessica or she will go to a walk in clinic. Offered appt with PCP tomorrow and she states she will go to a walk in clinic instead and hung up on RN before receiving home care advice or follow up advice.   1. SEVERITY: How bad is the pain?  (e.g., Scale 1-10; mild, moderate, or severe)     Improved today compared to yesterday. Mild today.  2. FREQUENCY: How many times have you had painful urination today?      About twice, not as frequent today compared to yesterday.  3. PATTERN: Is pain present every time you urinate or just sometimes?      Sometimes.   4. ONSET: When did the painful urination start?      Couple days.   5. FEVER: Do you have a fever? If Yes, ask: What is your temperature, how was it measured, and when did it start?     No.  6. PAST UTI: Have you had a urine infection  before? If Yes, ask: When was the last time? and What happened that time?      Last UTI about a year ago, was previously having them frequently.  7. CAUSE: What do you think is causing the painful urination?  (e.g., UTI, scratch, Herpes sore)     UTI.  8. OTHER SYMPTOMS: Do you have any other symptoms? (e.g., blood in urine, flank pain, genital sores, urgency, vaginal discharge)     No vomiting, fever, urination retention, flank or kidney pain.  Protocols used: Urination Pain - Female-A-AH

## 2024-02-16 ENCOUNTER — Encounter (INDEPENDENT_AMBULATORY_CARE_PROVIDER_SITE_OTHER): Admitting: Ophthalmology

## 2024-02-16 DIAGNOSIS — H43813 Vitreous degeneration, bilateral: Secondary | ICD-10-CM | POA: Diagnosis not present

## 2024-02-16 DIAGNOSIS — H35033 Hypertensive retinopathy, bilateral: Secondary | ICD-10-CM | POA: Diagnosis not present

## 2024-02-16 DIAGNOSIS — I10 Essential (primary) hypertension: Secondary | ICD-10-CM

## 2024-02-16 DIAGNOSIS — H353231 Exudative age-related macular degeneration, bilateral, with active choroidal neovascularization: Secondary | ICD-10-CM

## 2024-02-17 ENCOUNTER — Ambulatory Visit: Admitting: Family Medicine

## 2024-03-29 ENCOUNTER — Encounter (INDEPENDENT_AMBULATORY_CARE_PROVIDER_SITE_OTHER): Admitting: Ophthalmology

## 2024-08-03 ENCOUNTER — Ambulatory Visit

## 2024-08-12 ENCOUNTER — Other Ambulatory Visit

## 2024-08-22 ENCOUNTER — Ambulatory Visit: Admitting: Hematology and Oncology
